# Patient Record
Sex: Male | Born: 1945
Health system: Southern US, Community
[De-identification: ages and names within clinical notes are randomized; demographics above are authoritative.]

## PROBLEM LIST (undated history)

## (undated) DIAGNOSIS — C7951 Secondary malignant neoplasm of bone: Secondary | ICD-10-CM

## (undated) DIAGNOSIS — I10 Essential (primary) hypertension: Secondary | ICD-10-CM

## (undated) DIAGNOSIS — C61 Malignant neoplasm of prostate: Secondary | ICD-10-CM

## (undated) DIAGNOSIS — D62 Acute posthemorrhagic anemia: Secondary | ICD-10-CM

## (undated) HISTORY — PX: HIP SURGERY: SHX245

## (undated) HISTORY — DX: Acute posthemorrhagic anemia: D62

---

## 2006-03-22 DIAGNOSIS — F17200 Nicotine dependence, unspecified, uncomplicated: Secondary | ICD-10-CM | POA: Insufficient documentation

## 2010-08-28 DIAGNOSIS — Z96649 Presence of unspecified artificial hip joint: Secondary | ICD-10-CM | POA: Insufficient documentation

## 2012-11-25 DIAGNOSIS — I10 Essential (primary) hypertension: Secondary | ICD-10-CM | POA: Insufficient documentation

## 2012-11-25 DIAGNOSIS — E7800 Pure hypercholesterolemia, unspecified: Secondary | ICD-10-CM | POA: Insufficient documentation

## 2013-01-13 DIAGNOSIS — H2513 Age-related nuclear cataract, bilateral: Secondary | ICD-10-CM | POA: Insufficient documentation

## 2013-01-13 DIAGNOSIS — H35039 Hypertensive retinopathy, unspecified eye: Secondary | ICD-10-CM | POA: Insufficient documentation

## 2014-12-21 DIAGNOSIS — K219 Gastro-esophageal reflux disease without esophagitis: Secondary | ICD-10-CM | POA: Insufficient documentation

## 2015-11-22 DIAGNOSIS — R195 Other fecal abnormalities: Secondary | ICD-10-CM | POA: Insufficient documentation

## 2016-03-30 DIAGNOSIS — R7303 Prediabetes: Secondary | ICD-10-CM | POA: Insufficient documentation

## 2016-07-05 ENCOUNTER — Encounter: Payer: Self-pay | Admitting: Oncology

## 2016-07-05 ENCOUNTER — Telehealth: Payer: Self-pay | Admitting: Oncology

## 2016-07-05 NOTE — Telephone Encounter (Signed)
Appt w/Shadad on 10/31 at 1pm. Pt agreed. Demographics verified. Letter mailed to the pt.

## 2016-07-09 ENCOUNTER — Encounter: Payer: Self-pay | Admitting: *Deleted

## 2016-07-09 NOTE — Progress Notes (Signed)
This RN called and reminded patient of his appointment with Dr. Alen Blew tomorrow. Patient verbalized understanding.

## 2016-07-10 ENCOUNTER — Ambulatory Visit (HOSPITAL_BASED_OUTPATIENT_CLINIC_OR_DEPARTMENT_OTHER): Payer: Medicare Other

## 2016-07-10 ENCOUNTER — Telehealth: Payer: Self-pay | Admitting: Oncology

## 2016-07-10 ENCOUNTER — Ambulatory Visit (HOSPITAL_BASED_OUTPATIENT_CLINIC_OR_DEPARTMENT_OTHER): Payer: Medicare Other | Admitting: Oncology

## 2016-07-10 VITALS — BP 137/79 | HR 85 | Temp 98.5°F | Resp 20 | Ht 71.0 in | Wt 221.8 lb

## 2016-07-10 DIAGNOSIS — C61 Malignant neoplasm of prostate: Secondary | ICD-10-CM

## 2016-07-10 DIAGNOSIS — E291 Testicular hypofunction: Secondary | ICD-10-CM

## 2016-07-10 DIAGNOSIS — C7951 Secondary malignant neoplasm of bone: Secondary | ICD-10-CM

## 2016-07-10 LAB — CBC WITH DIFFERENTIAL/PLATELET
BASO%: 0.9 % (ref 0.0–2.0)
Basophils Absolute: 0.1 10*3/uL (ref 0.0–0.1)
EOS%: 6.2 % (ref 0.0–7.0)
Eosinophils Absolute: 0.4 10*3/uL (ref 0.0–0.5)
HCT: 36.7 % — ABNORMAL LOW (ref 38.4–49.9)
HGB: 12.2 g/dL — ABNORMAL LOW (ref 13.0–17.1)
LYMPH%: 30.6 % (ref 14.0–49.0)
MCH: 29.4 pg (ref 27.2–33.4)
MCHC: 33.3 g/dL (ref 32.0–36.0)
MCV: 88.4 fL (ref 79.3–98.0)
MONO#: 0.5 10*3/uL (ref 0.1–0.9)
MONO%: 7.8 % (ref 0.0–14.0)
NEUT#: 3.3 10*3/uL (ref 1.5–6.5)
NEUT%: 54.5 % (ref 39.0–75.0)
Platelets: 271 10*3/uL (ref 140–400)
RBC: 4.16 10*6/uL — ABNORMAL LOW (ref 4.20–5.82)
RDW: 14.4 % (ref 11.0–14.6)
WBC: 6.1 10*3/uL (ref 4.0–10.3)
lymph#: 1.9 10*3/uL (ref 0.9–3.3)

## 2016-07-10 LAB — COMPREHENSIVE METABOLIC PANEL
ALT: 21 U/L (ref 0–55)
AST: 19 U/L (ref 5–34)
Albumin: 3.6 g/dL (ref 3.5–5.0)
Alkaline Phosphatase: 150 U/L (ref 40–150)
Anion Gap: 10 mEq/L (ref 3–11)
BUN: 14.1 mg/dL (ref 7.0–26.0)
CO2: 24 mEq/L (ref 22–29)
Calcium: 9.2 mg/dL (ref 8.4–10.4)
Chloride: 107 mEq/L (ref 98–109)
Creatinine: 1.2 mg/dL (ref 0.7–1.3)
EGFR: 61 mL/min/{1.73_m2} — ABNORMAL LOW (ref >90)
Glucose: 111 mg/dl (ref 70–140)
Potassium: 3.5 mEq/L (ref 3.5–5.1)
Sodium: 142 mEq/L (ref 136–145)
Total Bilirubin: 0.28 mg/dL (ref 0.20–1.20)
Total Protein: 7.4 g/dL (ref 6.4–8.3)

## 2016-07-10 NOTE — Telephone Encounter (Signed)
Appointments scheduled per 10/31 LOS. Patient given AVS report and calendar of future scheduled appointments. °

## 2016-07-10 NOTE — Progress Notes (Signed)
Reason for Referral: Prostate cancer.   HPI: 70 year old gentleman native of [place] but lived in [place] for 50 years. He relocated to [facility] in April 2017 to be close to his daughter. He has history of hypertension and hyperlipidemia and was found to have an elevated PSA of 12.8 in 2016. At that time he was evaluated by his primary care physician for frequent urination and nocturia. Because of his PSA elevation, he was referred to urology and underwent a biopsy that showed a Gleason score 4+3 = 7 prostate cancer. Imaging studies showed a large tumor in his prostate with extracapsular extension and enlarged iliac lymph nodes. He was treated with androgen deprivation therapy initially with Lupron on a monthly basis and radiation therapy. Radiation completed in July 2016. Androgen deprivation therapy was switched to every three-month Lupron in April 2016. His PSA initially responded with a PSA nadir of 1.1. was in his PSA went up to 12 in July 2017. Staging workup at that time done at [facility] daily possible lesion in his scapula indicating bone disease. His bone scan showed a possible metastatic area in the medial margin of the right scapula. He did not have a CT scan of the abdomen and pelvis. He was started on Zytiga and prednisone in September 2017. He denied any complications related to this medication although he ran out in the last week or so. Nausea, vomiting or lower extremity edema. He denied any changes in his performance status or activity level. He denied any bone pain, back pain or pathological fractures. Says his relocation to this area, he lives independently and attends activities of daily living.  He does not report any headaches, blurry vision, syncope or seizures. He does not report any fevers, chills or sweats. He does not report any cough, wheezing or hemoptysis. He does not report any chest pain, palpitation, orthopnea or leg edema. He does not report any frequency urgency  or hesitancy. He does not report any musca skeletal complaints. Remaining review of systems unremarkable.   Past medical history including hypertension, hyperlipidemia and erectile dysfunction.    Current Outpatient Prescriptions:  .  abiraterone Acetate (ZYTIGA) 250 MG tablet, Take 1,000 mg by mouth daily. Take on an empty stomach 1 hour before or 2 hours after a meal, Disp: , Rfl:  .  Acetaminophen (TYLENOL PO), Take 500 mg by mouth daily as needed., Disp: , Rfl:  .  amLODipine (NORVASC) 10 MG tablet, Take 10 mg by mouth daily., Disp: , Rfl:  .  aspirin 81 MG tablet, Take 81 mg by mouth daily., Disp: , Rfl:  .  carvedilol (COREG) 12.5 MG tablet, Take 12.5 mg by mouth 2 (two) times daily., Disp: , Rfl: 3 .  ergocalciferol (VITAMIN D2) 50000 units capsule, Take 50,000 Units by mouth once a week., Disp: , Rfl:  .  Multiple Vitamin (MULTIVITAMIN) tablet, Take 1 tablet by mouth daily., Disp: , Rfl:  .  omeprazole (PRILOSEC) 20 MG capsule, Take 20 mg by mouth daily., Disp: , Rfl:  .  oxybutynin (DITROPAN-XL) 10 MG 24 hr tablet, Take 10 mg by mouth at bedtime., Disp: , Rfl:  .  sildenafil (VIAGRA) 100 MG tablet, Take 100 mg by mouth daily as needed for erectile dysfunction., Disp: , Rfl:  .  simvastatin (ZOCOR) 20 MG tablet, Take 20 mg by mouth daily., Disp: , Rfl: :  Allergies  Allergen Reactions  . Lisinopril Swelling    Facial swelling  :  No family history on file.:  Social History   Social History  . Marital status: Divorced    Spouse name: N/A  . Number of children: N/A  . Years of education: N/A   Occupational History  . Not on file.   Social History Main Topics  . Smoking status: Not on file  . Smokeless tobacco: Not on file  . Alcohol use Not on file  . Drug use: Unknown  . Sexual activity: Not on file   Other Topics Concern  . Not on file   Social History Narrative  . No narrative on file  :  Pertinent items are noted in HPI.  Exam: Blood pressure 137/79,  pulse 85, temperature 98.5 F (36.9 C), temperature source Oral, resp. rate 20, height 5\' 11"  (1.803 m), weight 221 lb 12.8 oz (100.6 kg), SpO2 100 %.  ECOG 0 General appearance: alert and cooperative Head: Normocephalic, without obvious abnormality Throat: lips, mucosa, and tongue normal; teeth and gums normal Neck: no adenopathy Back: negative Resp: clear to auscultation bilaterally Chest wall: no tenderness Cardio: regular rate and rhythm, S1, S2 normal, no murmur, click, rub or gallop GI: soft, non-tender; bowel sounds normal; no masses,  no organomegaly Extremities: extremities normal, atraumatic, no cyanosis or edema Pulses: 2+ and symmetric Skin: Skin color, texture, turgor normal. No rashes or lesions Lymph nodes: Cervical, supraclavicular, and axillary nodes normal.    Assessment and Plan:  70 year old gentleman with the following issues:  1. 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 He was initially diagnosed in March 2016 with a Gleason score of 7 and a PSA of 12.8. At that time he had high-risk disease with a large tumor, high volume disease with extracapsular extension. He also had pelvic adenopathy. He completed the radiation therapy and androgen deprivation in 2016.  His PSA in July 2017 was up to 12 after a nadir of 1.1 with a low testosterone indicating castration resistant disease. He was started on Zytiga and prednisone which he took for about a month and ran out from last week or so. He denies any major complications associated with it.  The natural course of this disease was discussed with the patient and his medical records were reviewed from his treating physicians in [place]. He understands that he is an incurable malignancy and any treatments moving forward is palliative. He appears to have very low volume disease at this time although he has high risk cancer. Options of therapy were reviewed which include restarting  Nicki Reaper for systemic chemotherapy.  After discussion today, he wanted in short period of observation until he gets several into this area and will consider restarting Zytiga depending on what his PSA is doing in the next 4-6 weeks. He is not objected to taking Zytiga but prefers not to at this time.  The plan is to obtain his imaging studies from [place] which are not available to me at this time and recheck his PSA. He develops a rapidly rising PSA restarting Zytiga soon a later would be indicated.  2. Androgen depravation: I recommen Lupron and he will be due in January 2018.   3. Bone directed therapy: We have discussed the role of restarting bone directed therapy in the form of Xgeva in the future after obtaining dental clearance.  4. Follow-up: Will be in the next 4-6 weeks to follow his progress closely.

## 2016-07-11 ENCOUNTER — Telehealth: Payer: Self-pay | Admitting: *Deleted

## 2016-07-11 LAB — PSA: Prostate Specific Ag, Serum: 10.1 ng/mL — ABNORMAL HIGH (ref 0.0–4.0)

## 2016-07-11 LAB — TESTOSTERONE: Testosterone, Serum: <3 ng/dL — ABNORMAL LOW (ref 264–916)

## 2016-07-11 NOTE — Telephone Encounter (Signed)
As noted below by Dr. Shadad, I informed patient of his PSA level. Patient verbalized understanding. 

## 2016-07-11 NOTE — Telephone Encounter (Signed)
-----   Message from Wyatt Portela, MD sent at 07/11/2016  8:38 AM EDT ----- Please let him know his PSA is 10. No changes for now.

## 2016-08-22 ENCOUNTER — Ambulatory Visit (HOSPITAL_BASED_OUTPATIENT_CLINIC_OR_DEPARTMENT_OTHER): Payer: Medicare Other | Admitting: Oncology

## 2016-08-22 ENCOUNTER — Other Ambulatory Visit (HOSPITAL_BASED_OUTPATIENT_CLINIC_OR_DEPARTMENT_OTHER): Payer: Medicare Other

## 2016-08-22 ENCOUNTER — Telehealth: Payer: Self-pay | Admitting: Oncology

## 2016-08-22 VITALS — BP 150/89 | HR 70 | Temp 98.2°F | Resp 18 | Ht 71.0 in | Wt 224.3 lb

## 2016-08-22 DIAGNOSIS — C61 Malignant neoplasm of prostate: Secondary | ICD-10-CM

## 2016-08-22 DIAGNOSIS — E291 Testicular hypofunction: Secondary | ICD-10-CM | POA: Diagnosis not present

## 2016-08-22 LAB — COMPREHENSIVE METABOLIC PANEL
ALT: 20 U/L (ref 0–55)
AST: 19 U/L (ref 5–34)
Albumin: 3.7 g/dL (ref 3.5–5.0)
Alkaline Phosphatase: 145 U/L (ref 40–150)
Anion Gap: 9 mEq/L (ref 3–11)
BUN: 10.6 mg/dL (ref 7.0–26.0)
CO2: 24 mEq/L (ref 22–29)
Calcium: 10.1 mg/dL (ref 8.4–10.4)
Chloride: 108 mEq/L (ref 98–109)
Creatinine: 1.2 mg/dL (ref 0.7–1.3)
EGFR: 63 mL/min/{1.73_m2} — ABNORMAL LOW (ref >90)
Glucose: 112 mg/dl (ref 70–140)
Potassium: 4.2 mEq/L (ref 3.5–5.1)
Sodium: 141 mEq/L (ref 136–145)
Total Bilirubin: 0.4 mg/dL (ref 0.20–1.20)
Total Protein: 7.9 g/dL (ref 6.4–8.3)

## 2016-08-22 LAB — CBC WITH DIFFERENTIAL/PLATELET
BASO%: 0.2 % (ref 0.0–2.0)
Basophils Absolute: 0 10*3/uL (ref 0.0–0.1)
EOS%: 4.4 % (ref 0.0–7.0)
Eosinophils Absolute: 0.2 10*3/uL (ref 0.0–0.5)
HCT: 37.8 % — ABNORMAL LOW (ref 38.4–49.9)
HGB: 12.6 g/dL — ABNORMAL LOW (ref 13.0–17.1)
LYMPH%: 33 % (ref 14.0–49.0)
MCH: 29.5 pg (ref 27.2–33.4)
MCHC: 33.3 g/dL (ref 32.0–36.0)
MCV: 88.5 fL (ref 79.3–98.0)
MONO#: 0.5 10*3/uL (ref 0.1–0.9)
MONO%: 8.6 % (ref 0.0–14.0)
NEUT#: 3 10*3/uL (ref 1.5–6.5)
NEUT%: 53.8 % (ref 39.0–75.0)
Platelets: 271 10*3/uL (ref 140–400)
RBC: 4.27 10*6/uL (ref 4.20–5.82)
RDW: 14.2 % (ref 11.0–14.6)
WBC: 5.5 10*3/uL (ref 4.0–10.3)
lymph#: 1.8 10*3/uL (ref 0.9–3.3)

## 2016-08-22 NOTE — Progress Notes (Signed)
Hematology and Oncology Follow Up Visit  Dennis Fischer [id number] 1946-02-03 70 y.o. 08/22/2016 9:16 AM Dennis Fischer, Dennis Newcomer, MD   Principle Diagnosis: 70 year old gentleman with prostate cancer diagnosed in 2016. He had a Gleason score of 7 and a PSA of 12.8. He is currently has castration resistant disease with pelvic adenopathy.   Prior Therapy: He completed the radiation therapy and androgen deprivation in 2016. His PSA in July 2017 was up to 12 after a nadir of 1.1 with a low testosterone indicating castration resistant disease. He was started on Zytiga and prednisone which he took for about a month and subsequently discontinued.  Current therapy: Under evaluation to start salvage therapy.  Interim History:  Dennis Fischer presents today for a follow-up visit. Since the last visit, he reports no major changes in his health. He remains reasonably active and attends to activities of daily living. His appetite is excellent and his performance status is unchanged. He denied any abdominal pain, gross satiety or bone pain.  He does not report any headaches, blurry vision, syncope or seizures. He does not report any fevers, chills or sweats. He does not report any cough, wheezing or hemoptysis. He does not report any chest pain, palpitation, orthopnea or leg edema. He does not report any frequency urgency or hesitancy. He does not report any musca skeletal complaints. Remaining review of systems unremarkable.   Medications: I have reviewed the patient's current medications.  Current Outpatient Prescriptions  Medication Sig Dispense Refill  . Acetaminophen (TYLENOL PO) Take 500 mg by mouth daily as needed.    Marland Kitchen amLODipine (NORVASC) 10 MG tablet Take 10 mg by mouth daily.    Marland Kitchen aspirin 81 MG tablet Take 81 mg by mouth daily.    . carvedilol (COREG) 12.5 MG tablet Take 12.5 mg by mouth 2 (two) times daily.  3  . ergocalciferol (VITAMIN D2) 50000 units capsule Take 50,000 Units by mouth  once a week.    . Multiple Vitamin (MULTIVITAMIN) tablet Take 1 tablet by mouth daily.    Marland Kitchen omeprazole (PRILOSEC) 20 MG capsule Take 20 mg by mouth daily.    Marland Kitchen oxybutynin (DITROPAN-XL) 10 MG 24 hr tablet Take 10 mg by mouth at bedtime.    . sildenafil (VIAGRA) 100 MG tablet Take 100 mg by mouth daily as needed for erectile dysfunction.    . simvastatin (ZOCOR) 20 MG tablet Take 20 mg by mouth daily.    Marland Kitchen abiraterone Acetate (ZYTIGA) 250 MG tablet Take 1,000 mg by mouth daily. Take on an empty stomach 1 hour before or 2 hours after a meal     No current facility-administered medications for this visit.      Allergies:  Allergies  Allergen Reactions  . Lisinopril Swelling    Facial swelling    Past Medical History, Surgical history, Social history, and Family History were reviewed and updated.   Remaining ROS negative. Physical Exam: Blood pressure (!) 150/89, pulse 70, temperature 98.2 F (36.8 C), temperature source Oral, resp. rate 18, height 5\' 11"  (1.803 m), weight 224 lb 4.8 oz (101.7 kg), SpO2 100 %. ECOG: 0 General appearance: alert and cooperative Head: Normocephalic, without obvious abnormality Neck: no adenopathy Lymph nodes: Cervical, supraclavicular, and axillary nodes normal. Heart:regular rate and rhythm, S1, S2 normal, no murmur, click, rub or gallop Lung:chest clear, no wheezing, rales, normal symmetric air entry Abdomin: soft, non-tender, without masses or organomegaly EXT:no erythema, induration, or nodules   Lab Results: Lab Results  Component Value Date  WBC 5.5 08/22/2016   HGB 12.6 (L) 08/22/2016   HCT 37.8 (L) 08/22/2016   MCV 88.5 08/22/2016   PLT 271 08/22/2016     Chemistry      Component Value Date/Time   NA 142 07/10/2016 1430   K 3.5 07/10/2016 1430   CO2 24 07/10/2016 1430   BUN 14.1 07/10/2016 1430   CREATININE 1.2 07/10/2016 1430      Component Value Date/Time   CALCIUM 9.2 07/10/2016 1430   ALKPHOS 150 07/10/2016 1430   AST 19  07/10/2016 1430   ALT 21 07/10/2016 1430   BILITOT 0.28 07/10/2016 1430      Impression and Plan:  1. 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 He was initially diagnosed in March 2016 with a Gleason score of 7 and a PSA of 12.8. At that time he had high-risk disease with a large tumor, high volume disease with extracapsular extension. He also had pelvic adenopathy. He completed the radiation therapy and androgen deprivation in 2016.  His PSA in July 2017 was up to 12 after a nadir of 1.1 with a low testosterone indicating castration resistant disease. He was started on Zytiga and prednisone which he took for about a month. He is currently not receiving any treatment. His last PSA was 10 and 07/10/2016.  Risks and benefits of restarting Zytiga were reviewed today and he elected to defer treatment for the time being. The plan is to continue with observation and surveillance and we will resume Zytiga's PSA starts to rise rapidly.  2. Androgen depravation: His testosterone is castrated at this time and will restart Lupron if his testosterone starts to rise.  3. Follow-up: I'll be in 2 months to check on his clinical status.    [facility], MD 12/13/20179:16 AM

## 2016-08-22 NOTE — Telephone Encounter (Signed)
Gave patient avs report and appointments for February.  °

## 2016-08-23 LAB — PSA: Prostate Specific Ag, Serum: 12.9 ng/mL — ABNORMAL HIGH (ref 0.0–4.0)

## 2016-09-20 DIAGNOSIS — E119 Type 2 diabetes mellitus without complications: Secondary | ICD-10-CM | POA: Diagnosis not present

## 2016-09-20 DIAGNOSIS — E669 Obesity, unspecified: Secondary | ICD-10-CM | POA: Diagnosis not present

## 2016-09-20 DIAGNOSIS — I10 Essential (primary) hypertension: Secondary | ICD-10-CM | POA: Diagnosis not present

## 2016-10-15 ENCOUNTER — Telehealth: Payer: Self-pay | Admitting: Oncology

## 2016-10-15 NOTE — Telephone Encounter (Signed)
Confirmed change in appointments

## 2016-10-19 ENCOUNTER — Ambulatory Visit: Payer: Medicare Other | Admitting: Oncology

## 2016-10-19 ENCOUNTER — Other Ambulatory Visit: Payer: Medicare Other

## 2016-11-01 ENCOUNTER — Ambulatory Visit (HOSPITAL_BASED_OUTPATIENT_CLINIC_OR_DEPARTMENT_OTHER): Payer: Medicare Other | Admitting: Oncology

## 2016-11-01 ENCOUNTER — Telehealth: Payer: Self-pay | Admitting: Oncology

## 2016-11-01 ENCOUNTER — Other Ambulatory Visit (HOSPITAL_BASED_OUTPATIENT_CLINIC_OR_DEPARTMENT_OTHER): Payer: Medicare Other

## 2016-11-01 VITALS — BP 130/87 | HR 79 | Temp 97.9°F | Resp 18 | Ht 71.0 in | Wt 229.4 lb

## 2016-11-01 DIAGNOSIS — C61 Malignant neoplasm of prostate: Secondary | ICD-10-CM

## 2016-11-01 DIAGNOSIS — E291 Testicular hypofunction: Secondary | ICD-10-CM | POA: Diagnosis not present

## 2016-11-01 LAB — COMPREHENSIVE METABOLIC PANEL
ALT: 20 U/L (ref 0–55)
AST: 17 U/L (ref 5–34)
Albumin: 3.8 g/dL (ref 3.5–5.0)
Alkaline Phosphatase: 158 U/L — ABNORMAL HIGH (ref 40–150)
Anion Gap: 11 mEq/L (ref 3–11)
BUN: 14.6 mg/dL (ref 7.0–26.0)
CO2: 20 mEq/L — ABNORMAL LOW (ref 22–29)
Calcium: 9.3 mg/dL (ref 8.4–10.4)
Chloride: 109 mEq/L (ref 98–109)
Creatinine: 1.1 mg/dL (ref 0.7–1.3)
EGFR: 68 mL/min/{1.73_m2} — ABNORMAL LOW (ref >90)
Glucose: 110 mg/dl (ref 70–140)
Potassium: 3.6 mEq/L (ref 3.5–5.1)
Sodium: 140 mEq/L (ref 136–145)
Total Bilirubin: 0.28 mg/dL (ref 0.20–1.20)
Total Protein: 7.3 g/dL (ref 6.4–8.3)

## 2016-11-01 LAB — CBC WITH DIFFERENTIAL/PLATELET
BASO%: 0.2 % (ref 0.0–2.0)
Basophils Absolute: 0 10*3/uL (ref 0.0–0.1)
EOS%: 5.3 % (ref 0.0–7.0)
Eosinophils Absolute: 0.3 10*3/uL (ref 0.0–0.5)
HCT: 34.2 % — ABNORMAL LOW (ref 38.4–49.9)
HGB: 11.5 g/dL — ABNORMAL LOW (ref 13.0–17.1)
LYMPH%: 36.2 % (ref 14.0–49.0)
MCH: 29 pg (ref 27.2–33.4)
MCHC: 33.6 g/dL (ref 32.0–36.0)
MCV: 86.4 fL (ref 79.3–98.0)
MONO#: 0.5 10*3/uL (ref 0.1–0.9)
MONO%: 8.5 % (ref 0.0–14.0)
NEUT#: 2.6 10*3/uL (ref 1.5–6.5)
NEUT%: 49.8 % (ref 39.0–75.0)
Platelets: 229 10*3/uL (ref 140–400)
RBC: 3.96 10*6/uL — ABNORMAL LOW (ref 4.20–5.82)
RDW: 14.6 % (ref 11.0–14.6)
WBC: 5.3 10*3/uL (ref 4.0–10.3)
lymph#: 1.9 10*3/uL (ref 0.9–3.3)

## 2016-11-01 NOTE — Telephone Encounter (Signed)
Appointments scheduled per 2/22 LOS. Patient given AVS report and calendars with future scheduled appointments. °

## 2016-11-01 NOTE — Progress Notes (Signed)
Hematology and Oncology Follow Up Visit  Dennis Fischer [id number] 1946/08/03 71 y.o. 11/01/2016 4:14 PM Dennis Fischer, Dennis Newcomer, MD   Principle Diagnosis: 71 year old gentleman with prostate cancer diagnosed in 2016. He had a Gleason score of 7 and a PSA of 12.8. He is currently has castration resistant disease with pelvic adenopathy.   Prior Therapy: He completed the radiation therapy and androgen deprivation in 2016. His PSA in July 2017 was up to 12 after a nadir of 1.1 with a low testosterone indicating castration resistant disease. He was started on Zytiga and prednisone which he took for about a month and subsequently discontinued.  Current therapy: Under evaluation to start salvage therapy.  Interim History:  Dennis Fischer presents today for a follow-up visit. Since the last visit, he continues to be asymptomatic from his cancer. He denied any back pain, shoulder pain or pathological fractures. He remains reasonably active and attends to activities of daily living. His appetite is excellent and his performance status is unchanged. He denied any abdominal pain or change in his bowel habits..  He does not report any headaches, blurry vision, syncope or seizures. He does not report any fevers, chills or sweats. He does not report any cough, wheezing or hemoptysis. He does not report any chest pain, palpitation, orthopnea or leg edema. He does not report any frequency urgency or hesitancy. He does not report any musca skeletal complaints. Remaining review of systems unremarkable.   Medications: I have reviewed the patient's current medications.  Current Outpatient Prescriptions  Medication Sig Dispense Refill  . abiraterone Acetate (ZYTIGA) 250 MG tablet Take 1,000 mg by mouth daily. Take on an empty stomach 1 hour before or 2 hours after a meal    . Acetaminophen (TYLENOL PO) Take 500 mg by mouth daily as needed.    Marland Kitchen amLODipine (NORVASC) 10 MG tablet Take 10 mg by mouth daily.     Marland Kitchen aspirin 81 MG tablet Take 81 mg by mouth daily.    . carvedilol (COREG) 12.5 MG tablet Take 12.5 mg by mouth 2 (two) times daily.  3  . ergocalciferol (VITAMIN D2) 50000 units capsule Take 50,000 Units by mouth once a week.    . Multiple Vitamin (MULTIVITAMIN) tablet Take 1 tablet by mouth daily.    Marland Kitchen omeprazole (PRILOSEC) 20 MG capsule Take 20 mg by mouth daily.    Marland Kitchen oxybutynin (DITROPAN-XL) 10 MG 24 hr tablet Take 10 mg by mouth at bedtime.    . sildenafil (VIAGRA) 100 MG tablet Take 100 mg by mouth daily as needed for erectile dysfunction.    . simvastatin (ZOCOR) 20 MG tablet Take 20 mg by mouth daily.     No current facility-administered medications for this visit.      Allergies:  Allergies  Allergen Reactions  . Lisinopril Swelling    Facial swelling    Past Medical History, Surgical history, Social history, and Family History were reviewed and updated.   Remaining ROS negative. Physical Exam: Blood pressure 130/87, pulse 79, temperature 97.9 F (36.6 C), temperature source Oral, resp. rate 18, height 5\' 11"  (1.803 m), weight 229 lb 6.4 oz (104.1 kg), SpO2 99 %. ECOG: 0 General appearance: alert and cooperative appeared without distress. Head: Normocephalic, without obvious abnormality no oral ulcers or lesions. Neck: no adenopathy Lymph nodes: Cervical, supraclavicular, and axillary nodes normal. Heart:regular rate and rhythm, S1, S2 normal, no murmur, click, rub or gallop Lung:chest clear, no wheezing, rales, normal symmetric air entry Abdomin: soft, non-tender, without  masses or organomegaly no shifting dullness or ascites. EXT:no erythema, induration, or nodules   Lab Results: Lab Results  Component Value Date   WBC 5.3 11/01/2016   HGB 11.5 (L) 11/01/2016   HCT 34.2 (L) 11/01/2016   MCV 86.4 11/01/2016   PLT 229 11/01/2016     Chemistry      Component Value Date/Time   NA 140 11/01/2016 1533   K 3.6 11/01/2016 1533   CO2 20 (L) 11/01/2016 1533    BUN 14.6 11/01/2016 1533   CREATININE 1.1 11/01/2016 1533      Component Value Date/Time   CALCIUM 9.3 11/01/2016 1533   ALKPHOS 158 (H) 11/01/2016 1533   AST 17 11/01/2016 1533   ALT 20 11/01/2016 1533   BILITOT 0.28 11/01/2016 1533      Impression and Plan:  1. 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 He was initially diagnosed in March 2016 with a Gleason score of 7 and a PSA of 12.8. At that time he had high-risk disease with a large tumor, high volume disease with extracapsular extension. He also had pelvic adenopathy. He completed the radiation therapy and androgen deprivation in 2016.  His PSA in July 2017 was up to 12 after a nadir of 1.1 with a low testosterone indicating castration resistant disease. He was started on Zytiga and prednisone which he took for about a month.  He has been off treatment since relocating from [place] to this area based on his wishes. His PSA remained relatively stable close to 12 of treatment.  Risks and benefits of restarting Zytiga were reviewed today and he elected to defer treatment for the time being. The plan is to restage him before the next visit including a CT scan and a bone scan and a repeat PSA. Different salvage therapy will be used in his PSA starts to rise rapidly or he becomes symptomatic from his disease. He understands he will likely require either restart of Zytiga or different agent.  2. Androgen depravation: His testosterone is castrated at this time and will restart Lupron if his testosterone starts to rise.  3. Follow-up: I'll be in May 2018 after repeat staging workup.    [facility], MD 2/22/20184:14 PM

## 2016-11-02 LAB — PSA: Prostate Specific Ag, Serum: 22.5 ng/mL — ABNORMAL HIGH (ref 0.0–4.0)

## 2016-11-02 LAB — TESTOSTERONE: Testosterone, Serum: 5 ng/dL — ABNORMAL LOW (ref 264–916)

## 2017-01-11 ENCOUNTER — Other Ambulatory Visit (HOSPITAL_BASED_OUTPATIENT_CLINIC_OR_DEPARTMENT_OTHER): Payer: Medicare Other

## 2017-01-11 DIAGNOSIS — C61 Malignant neoplasm of prostate: Secondary | ICD-10-CM

## 2017-01-11 LAB — COMPREHENSIVE METABOLIC PANEL
ALT: 17 U/L (ref 0–55)
AST: 15 U/L (ref 5–34)
Albumin: 4.1 g/dL (ref 3.5–5.0)
Alkaline Phosphatase: 170 U/L — ABNORMAL HIGH (ref 40–150)
Anion Gap: 12 mEq/L — ABNORMAL HIGH (ref 3–11)
BUN: 13.6 mg/dL (ref 7.0–26.0)
CO2: 23 mEq/L (ref 22–29)
Calcium: 9.5 mg/dL (ref 8.4–10.4)
Chloride: 107 mEq/L (ref 98–109)
Creatinine: 1.2 mg/dL (ref 0.7–1.3)
EGFR: 60 mL/min/{1.73_m2} — ABNORMAL LOW (ref >90)
Glucose: 104 mg/dl (ref 70–140)
Potassium: 4.1 mEq/L (ref 3.5–5.1)
Sodium: 142 mEq/L (ref 136–145)
Total Bilirubin: 0.46 mg/dL (ref 0.20–1.20)
Total Protein: 7.4 g/dL (ref 6.4–8.3)

## 2017-01-11 LAB — CBC WITH DIFFERENTIAL/PLATELET
BASO%: 1 % (ref 0.0–2.0)
Basophils Absolute: 0 10*3/uL (ref 0.0–0.1)
EOS%: 5 % (ref 0.0–7.0)
Eosinophils Absolute: 0.2 10*3/uL (ref 0.0–0.5)
HCT: 37.8 % — ABNORMAL LOW (ref 38.4–49.9)
HGB: 12.6 g/dL — ABNORMAL LOW (ref 13.0–17.1)
LYMPH%: 34.7 % (ref 14.0–49.0)
MCH: 29.3 pg (ref 27.2–33.4)
MCHC: 33.4 g/dL (ref 32.0–36.0)
MCV: 87.8 fL (ref 79.3–98.0)
MONO#: 0.4 10*3/uL (ref 0.1–0.9)
MONO%: 9.4 % (ref 0.0–14.0)
NEUT#: 2.4 10*3/uL (ref 1.5–6.5)
NEUT%: 49.9 % (ref 39.0–75.0)
Platelets: 255 10*3/uL (ref 140–400)
RBC: 4.31 10*6/uL (ref 4.20–5.82)
RDW: 15.3 % — ABNORMAL HIGH (ref 11.0–14.6)
WBC: 4.7 10*3/uL (ref 4.0–10.3)
lymph#: 1.6 10*3/uL (ref 0.9–3.3)

## 2017-01-14 LAB — PSA: Prostate Specific Ag, Serum: 61.7 ng/mL — ABNORMAL HIGH (ref 0.0–4.0)

## 2017-01-14 LAB — TESTOSTERONE: Testosterone, Serum: 294 ng/dL (ref 264–916)

## 2017-01-18 ENCOUNTER — Ambulatory Visit (HOSPITAL_COMMUNITY)
Admission: RE | Admit: 2017-01-18 | Discharge: 2017-01-18 | Disposition: A | Discharge status: Home / Self Care | Payer: Medicare Other | Source: Ambulatory Visit | Attending: Oncology | Admitting: Oncology

## 2017-01-18 ENCOUNTER — Encounter (HOSPITAL_COMMUNITY)
Admission: RE | Admit: 2017-01-18 | Discharge: 2017-01-18 | Disposition: A | Discharge status: Home / Self Care | Payer: Medicare Other | Source: Ambulatory Visit | Attending: Oncology | Admitting: Oncology

## 2017-01-18 ENCOUNTER — Other Ambulatory Visit: Payer: Medicare Other

## 2017-01-18 DIAGNOSIS — K449 Diaphragmatic hernia without obstruction or gangrene: Secondary | ICD-10-CM | POA: Insufficient documentation

## 2017-01-18 DIAGNOSIS — C61 Malignant neoplasm of prostate: Secondary | ICD-10-CM

## 2017-01-18 DIAGNOSIS — R937 Abnormal findings on diagnostic imaging of other parts of musculoskeletal system: Secondary | ICD-10-CM | POA: Insufficient documentation

## 2017-01-18 DIAGNOSIS — K4090 Unilateral inguinal hernia, without obstruction or gangrene, not specified as recurrent: Secondary | ICD-10-CM | POA: Insufficient documentation

## 2017-01-18 IMAGING — NM NM BONE WHOLE BODY
2 series · 2 of 2 positions shown · non-contrast
Comparison: Abdomen and pelvis CT obtained today.

CLINICAL DATA: Followup prostate cancer with bone metastases. No
pain. Elevated PSA of 61.7.

EXAM:
NUCLEAR MEDICINE WHOLE BODY BONE SCAN
TECHNIQUE: Whole body anterior and posterior images were obtained approximately
3 hours after intravenous injection of radiopharmaceutical.
RADIOPHARMACEUTICALS:  19.1 mCi [9C] MDP IV

[Series 1: wbr_bone_40 whole body · 2.66mm/px · 1 of 1 slices shown (1 of 2)]
[im 1/1]
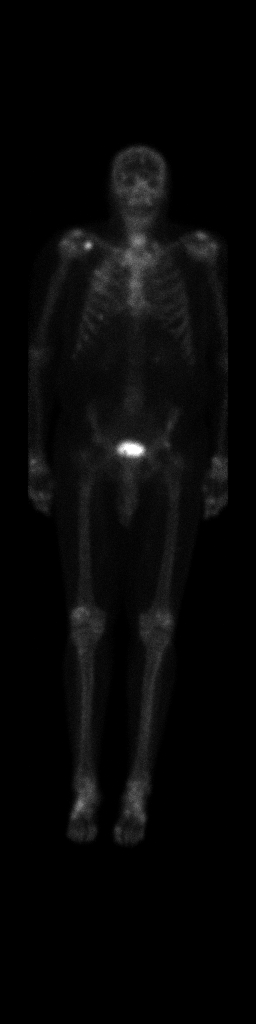

[Series 1: wbr_bone_40 whole body · 2.66mm/px · 1 of 1 slices shown (2 of 2)]
[im 1/1]
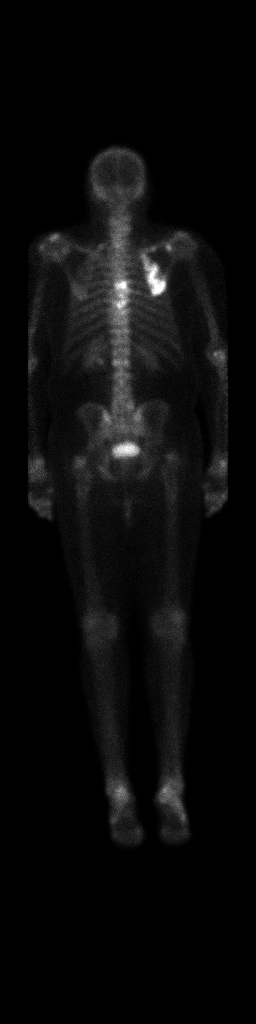

[2 of 2 positions shown; findings below may reference images not displayed]

FINDINGS: Extensive increased tracer uptake in the medial aspect of the right
scapula. Multiple foci of increased tracer uptake in the mid
thoracic spine. Focally increased tracer uptake in the right
shoulder, most likely in the lateral aspect of the scapula.
Additional increased tracer uptake in both shoulders, both wrists,
both knees, both ankles and both feet, compatible with degenerative
changes. Multiple foci of increased tracer uptake throughout the
sternum and manubrium. Normal renal and bladder activity.
IMPRESSION: 1. Findings compatible with metastatic disease in the right scapula,
T6 through T8 levels of the thoracic spine and in the sternum and
manubrium.
2. Multi joint degenerative changes.

## 2017-01-18 IMAGING — CT CT ABD-PELV W/ CM
2 of 5 series · 15 of 46 positions shown, 17 images · IV contrast (APPLIED)
Comparison: None.

CLINICAL DATA: Prostate carcinoma. Previous radiation therapy and
androgen deprivation.

EXAM:
CT ABDOMEN AND PELVIS WITH CONTRAST
TECHNIQUE: Multidetector CT imaging of the abdomen and pelvis was performed
using the standard protocol following bolus administration of
intravenous contrast.
CONTRAST:  100mL [B9] IOPAMIDOL ([B9]) INJECTION 61%

[Series 2: axial st · axial · 0.86mm/px · z∈[-461,-81]mm · 12 of 90 slices shown, 14 images]
[im 7/90  soft-tissue]
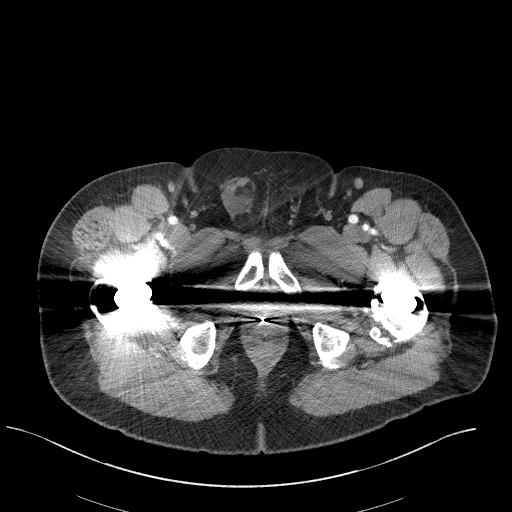
[im 7/90  bone]
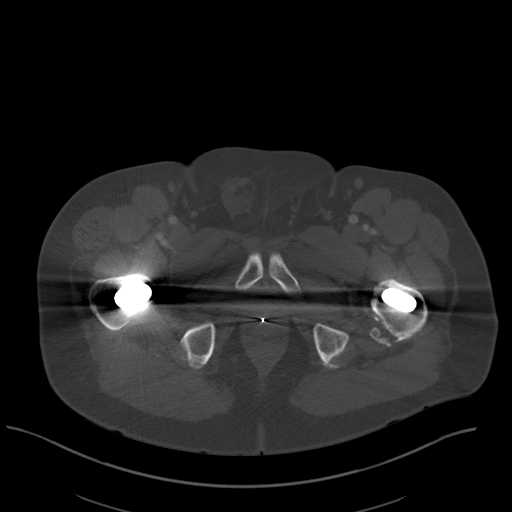
[im 14/90  soft-tissue]
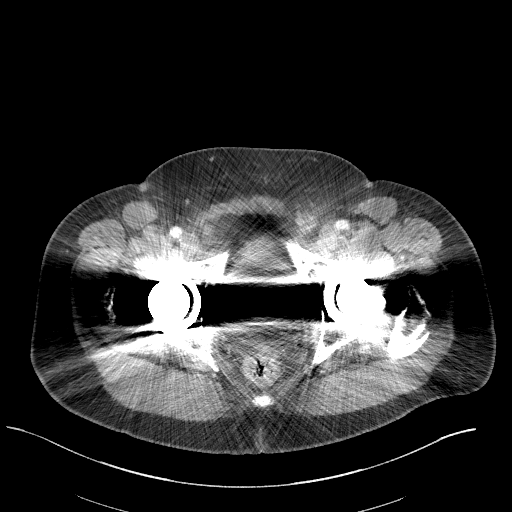
[im 21/90  soft-tissue]
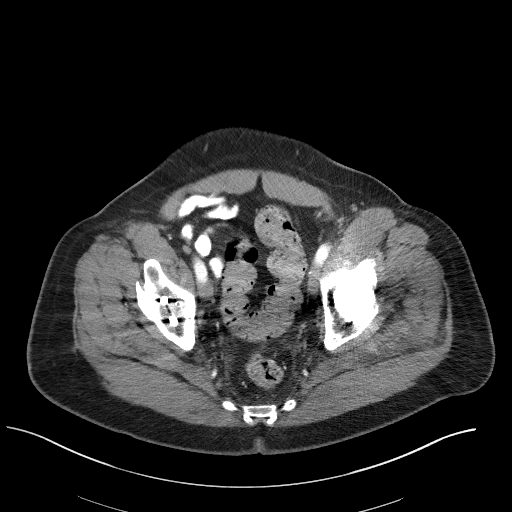
[im 28/90  soft-tissue]
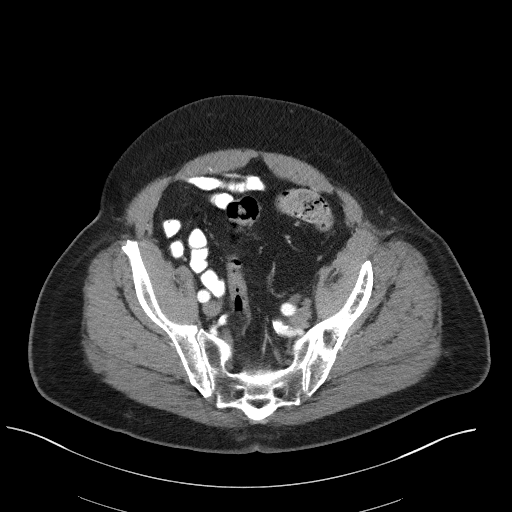
[im 35/90  soft-tissue]
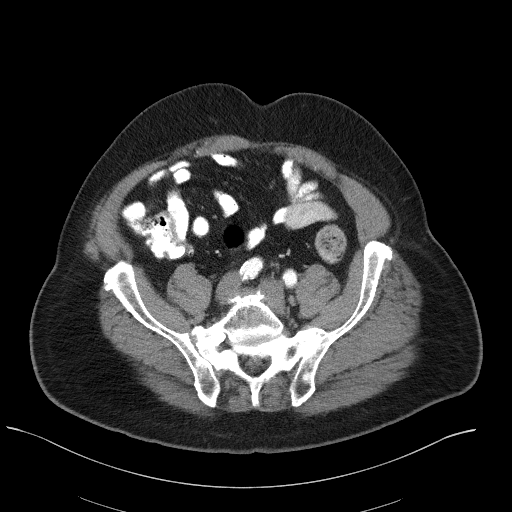
[im 42/90  soft-tissue]
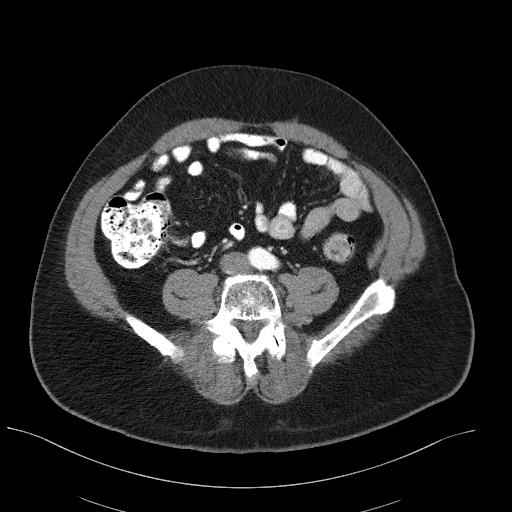
[im 48/90  soft-tissue]
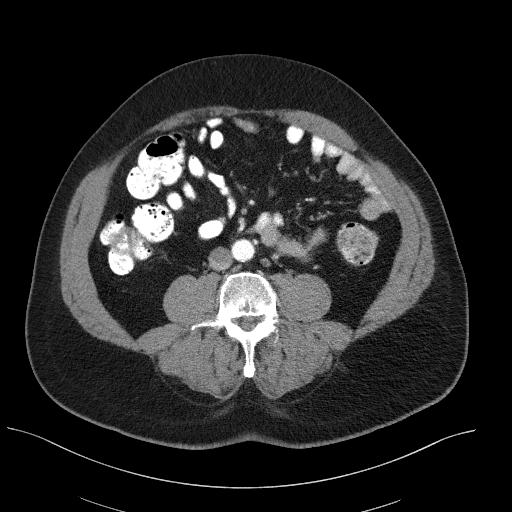
[im 55/90  soft-tissue]
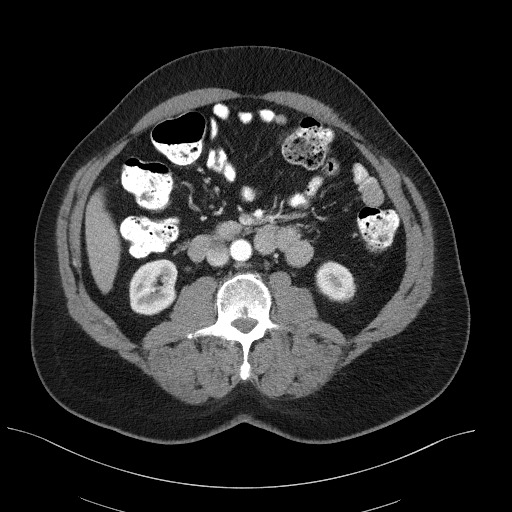
[im 62/90  soft-tissue]
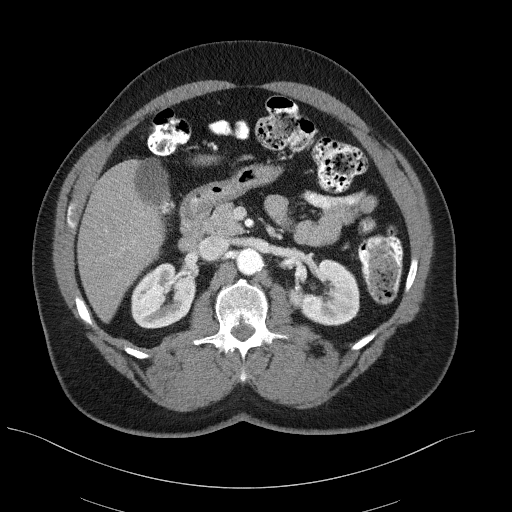
[im 62/90  bone]
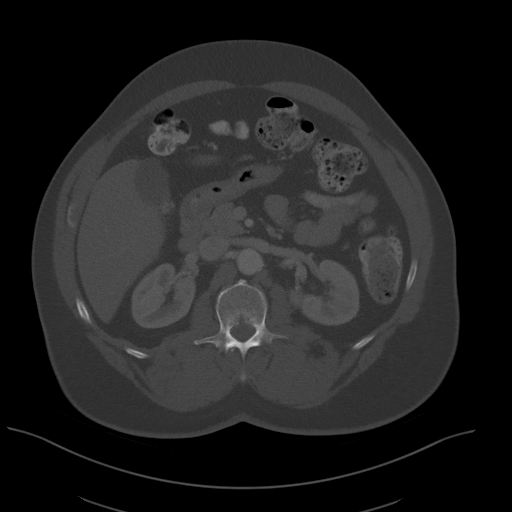
[im 69/90  soft-tissue]
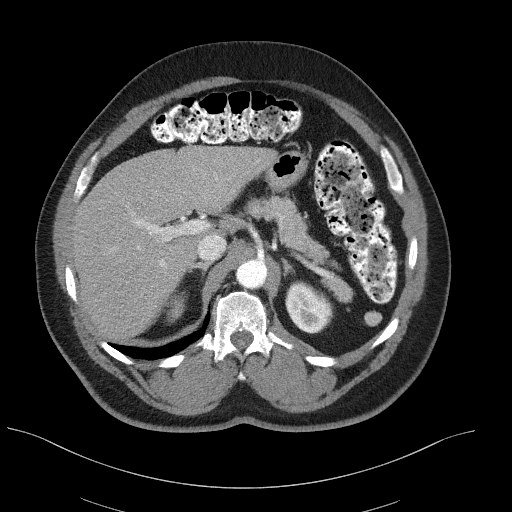
[im 76/90  soft-tissue]
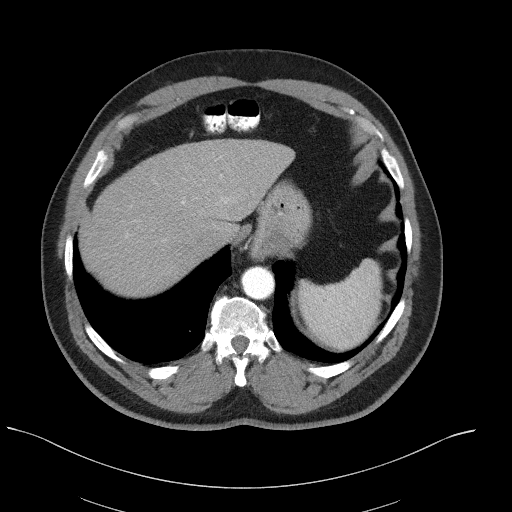
[im 83/90  soft-tissue]
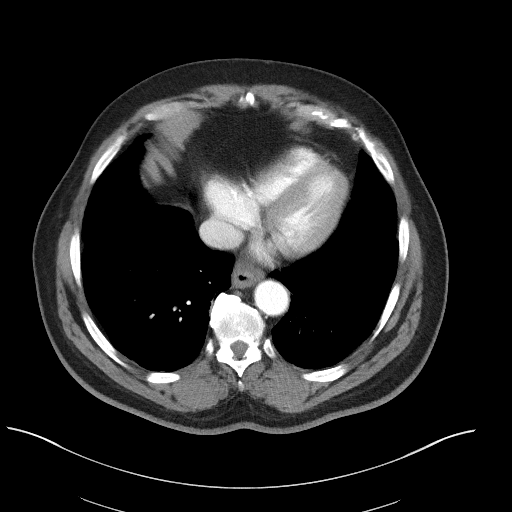

[Series 5: coronal st · coronal · 0.68mm/px · 3 of 108 slices shown]
[im 36/108  soft-tissue]
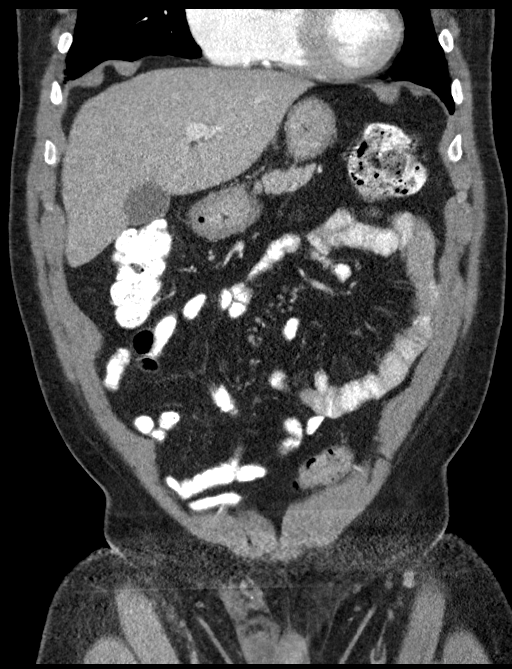
[im 48/108  soft-tissue]
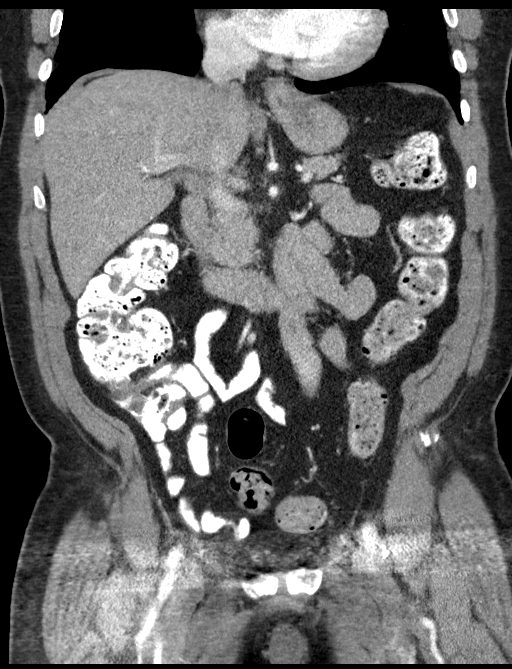
[im 60/108  soft-tissue]
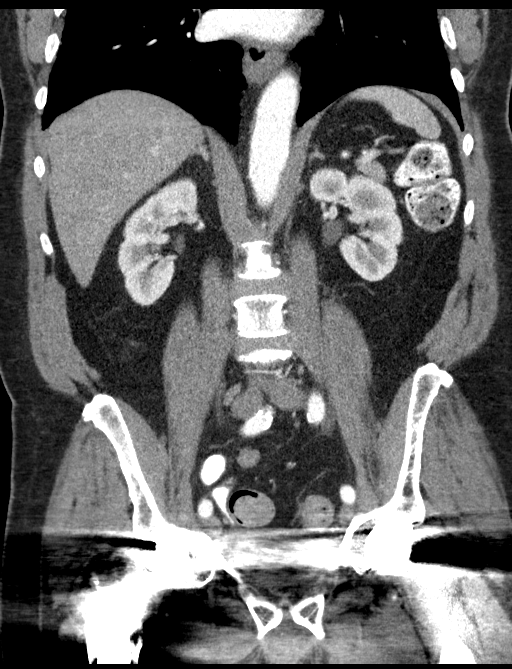

[15 of 46 positions shown; findings below may reference images not displayed]

FINDINGS: Lower Chest: No acute findings.

Hepatobiliary:  No masses identified. Gallbladder is unremarkable.

Pancreas:  No mass or inflammatory changes.

Spleen: Within normal limits in size and appearance.

Adrenals/Urinary Tract: No masses identified. No evidence of
hydronephrosis. Bladder obscured by beam hardening artifact from
bilateral hip prostheses .

Stomach/Bowel: Small hiatal hernia. No evidence of obstruction,
inflammatory process or abnormal fluid collections. Normal appendix
visualized.

Vascular/Lymphatic: No pathologically enlarged lymph nodes. No
abdominal aortic aneurysm.

Reproductive: Lower pelvis and region of prostate obscured by marked
beam hardening artifact from bilateral hip prostheses.

Other:  Small right inguinal hernia containing only fat.

Musculoskeletal: No suspicious bone lesions identified. Lower lumbar
spondylosis, with severe degenerative disc disease and grade 1
anterolisthesis at L4-5.
IMPRESSION: Lower pelvis and region of prostate gland obscured by artifact from
bilateral hip prostheses. No evidence of metastatic disease
elsewhere within the pelvis or abdomen.

Small hiatal hernia and fat-containing right inguinal hernia.

## 2017-01-18 MED ORDER — IOPAMIDOL (ISOVUE-300) INJECTION 61%
100.0000 mL | Freq: Once | INTRAVENOUS | Status: AC | PRN
  Administered 2017-01-18: 100 mL via INTRAVENOUS

## 2017-01-18 MED ORDER — TECHNETIUM TC 99M MEDRONATE IV KIT
25.0000 | PACK | Freq: Once | INTRAVENOUS | Status: DC | PRN
Start: 2017-01-18 — End: 2017-01-24

## 2017-01-18 MED ORDER — IOPAMIDOL (ISOVUE-300) INJECTION 61%
INTRAVENOUS | Status: AC
  Filled 2017-01-18: qty 100

## 2017-01-22 ENCOUNTER — Ambulatory Visit (HOSPITAL_BASED_OUTPATIENT_CLINIC_OR_DEPARTMENT_OTHER): Payer: Medicare Other | Admitting: Oncology

## 2017-01-22 ENCOUNTER — Telehealth: Payer: Self-pay | Admitting: Oncology

## 2017-01-22 VITALS — BP 142/88 | HR 59 | Temp 98.4°F | Resp 18 | Ht 71.0 in | Wt 224.1 lb

## 2017-01-22 DIAGNOSIS — C7951 Secondary malignant neoplasm of bone: Secondary | ICD-10-CM | POA: Diagnosis not present

## 2017-01-22 DIAGNOSIS — E291 Testicular hypofunction: Secondary | ICD-10-CM

## 2017-01-22 DIAGNOSIS — C61 Malignant neoplasm of prostate: Secondary | ICD-10-CM

## 2017-01-22 DIAGNOSIS — I10 Essential (primary) hypertension: Secondary | ICD-10-CM

## 2017-01-22 MED ORDER — ABIRATERONE ACETATE 250 MG PO TABS: 1000.0000 mg | ORAL_TABLET | Freq: Every day | ORAL | 0 refills | Status: DC

## 2017-01-22 NOTE — Telephone Encounter (Signed)
Gave patient avs report and appointments for May and June.  °

## 2017-01-22 NOTE — Progress Notes (Signed)
Hematology and Oncology Follow Up Visit  Dennis Fischer [id number] [birth year] 71 y.o. 01/22/2017 9:10 AM Elwyn Reach, MDGarba, Henderson Newcomer, MD   Principle Diagnosis: 10 year old gentleman with prostate cancer diagnosed in 2016. He had a Gleason score of 7 and a PSA of 12.8. He is currently has castration resistant disease with pelvic adenopathy.   Prior Therapy: He completed the radiation therapy and androgen deprivation in 2016. His PSA in July 2017 was up to 12 after a nadir of 1.1 with a low testosterone indicating castration resistant disease. He was started on Zytiga and prednisone which he took for about a month and subsequently discontinued.  Current therapy: Under evaluation to start therapy.  Interim History:  Mr. Garrido presents today for a follow-up visit. Since the last visit, he reports no major changes in his health. He reports no back pain, shoulder pain or pathological fractures. He remains reasonably active and attends to activities of daily living. His appetite is excellent and his performance status is unchanged. He does not report any genitourinary complaints without any hematuria or dysuria. His quality of life is not dramatically changed. He is having more difficulties with ambulating long distances as a lack of stamina.  He does not report any headaches, blurry vision, syncope or seizures. He does not report any fevers, chills or sweats. He does not report any cough, wheezing or hemoptysis. He does not report any chest pain, palpitation, orthopnea or leg edema. He does not report any frequency urgency or hesitancy. He does not report any musca skeletal complaints. Remaining review of systems unremarkable.   Medications: I have reviewed the patient's current medications.  Current Outpatient Prescriptions  Medication Sig Dispense Refill  . abiraterone Acetate (ZYTIGA) 250 MG tablet Take 1,000 mg by mouth daily. Take on an empty stomach 1 hour before or 2 hours after a  meal    . abiraterone Acetate (ZYTIGA) 250 MG tablet Take 4 tablets (1,000 mg total) by mouth daily. Take on an empty stomach 1 hour before or 2 hours after a meal 120 tablet 0  . Acetaminophen (TYLENOL PO) Take 500 mg by mouth daily as needed.    Marland Kitchen amLODipine (NORVASC) 10 MG tablet Take 10 mg by mouth daily.    Marland Kitchen aspirin 81 MG tablet Take 81 mg by mouth daily.    . carvedilol (COREG) 12.5 MG tablet Take 12.5 mg by mouth 2 (two) times daily.  3  . ergocalciferol (VITAMIN D2) 50000 units capsule Take 50,000 Units by mouth once a week.    . Multiple Vitamin (MULTIVITAMIN) tablet Take 1 tablet by mouth daily.    Marland Kitchen omeprazole (PRILOSEC) 20 MG capsule Take 20 mg by mouth daily.    Marland Kitchen oxybutynin (DITROPAN-XL) 10 MG 24 hr tablet Take 10 mg by mouth at bedtime.    . sildenafil (VIAGRA) 100 MG tablet Take 100 mg by mouth daily as needed for erectile dysfunction.    . simvastatin (ZOCOR) 20 MG tablet Take 20 mg by mouth daily.     No current facility-administered medications for this visit.    Facility-Administered Medications Ordered in Other Visits  Medication Dose Route Frequency Provider Last Rate Last Dose  . technetium medronate (TC-MDP) injection 25 millicurie  25 millicurie Intravenous Once PRN Rolm Baptise, MD         Allergies:  Allergies  Allergen Reactions  . Lisinopril Swelling    Facial swelling    Past Medical History, Surgical history, Social history, and Family History were reviewed  and updated.    Physical Exam: Blood pressure (!) 142/88, pulse (!) 59, temperature 98.4 F (36.9 C), temperature source Oral, resp. rate 18, height 5\' 11"  (1.803 m), weight 224 lb 1.6 oz (101.7 kg), SpO2 100 %. ECOG: 0 General appearance: Alert, awake gentleman appeared comfortable without distress. Head: Normocephalic, without obvious abnormality no oral thrush or ulcers. Neck: no adenopathy Lymph nodes: Cervical, supraclavicular, and axillary nodes normal. Heart:regular rate and rhythm,  S1, S2 normal, no murmur, click, rub or gallop Lung:chest clear, no wheezing, rales, normal symmetric air entry Abdomin: soft, non-tender, without masses or organomegaly no rebound or guarding. EXT:no erythema, induration, or nodules   Lab Results: Lab Results  Component Value Date   WBC 4.7 01/11/2017   HGB 12.6 (L) 01/11/2017   HCT 37.8 (L) 01/11/2017   MCV 87.8 01/11/2017   PLT 255 01/11/2017     Chemistry      Component Value Date/Time   NA 142 01/11/2017 0801   K 4.1 01/11/2017 0801   CO2 23 01/11/2017 0801   BUN 13.6 01/11/2017 0801   CREATININE 1.2 01/11/2017 0801      Component Value Date/Time   CALCIUM 9.5 01/11/2017 0801   ALKPHOS 170 (H) 01/11/2017 0801   AST 15 01/11/2017 0801   ALT 17 01/11/2017 0801   BILITOT 0.46 01/11/2017 0801      Results for KOLESON, REIFSTECK (MRN [id number]) as of 01/22/2017 08:43  Ref. Range 01/11/2017 08:01  Testosterone Latest Ref Range: 264 - 916 ng/dL 294  PSA Latest Ref Range: 0.0 - 4.0 ng/mL 61.7 (H)   EXAM: CT ABDOMEN AND PELVIS WITH CONTRAST  TECHNIQUE: Multidetector CT imaging of the abdomen and pelvis was performed using the standard protocol following bolus administration of intravenous contrast.  CONTRAST:  149mL ISOVUE-300 IOPAMIDOL (ISOVUE-300) INJECTION 61%  COMPARISON:  None.  FINDINGS: Lower Chest: No acute findings.  Hepatobiliary:  No masses identified. Gallbladder is unremarkable.  Pancreas:  No mass or inflammatory changes.  Spleen: Within normal limits in size and appearance.  Adrenals/Urinary Tract: No masses identified. No evidence of hydronephrosis. Bladder obscured by beam hardening artifact from bilateral hip prostheses .  Stomach/Bowel: Small hiatal hernia. No evidence of obstruction, inflammatory process or abnormal fluid collections. Normal appendix visualized.  Vascular/Lymphatic: No pathologically enlarged lymph nodes. No abdominal aortic aneurysm.  Reproductive: Lower  pelvis and region of prostate obscured by marked beam hardening artifact from bilateral hip prostheses.  Other:  Small right inguinal hernia containing only fat.  Musculoskeletal: No suspicious bone lesions identified. Lower lumbar spondylosis, with severe degenerative disc disease and grade 1 anterolisthesis at L4-5.  IMPRESSION: Lower pelvis and region of prostate gland obscured by artifact from bilateral hip prostheses. No evidence of metastatic disease elsewhere within the pelvis or abdomen.  Small hiatal hernia and fat-containing right inguinal hernia  EXAM: NUCLEAR MEDICINE WHOLE BODY BONE SCAN  TECHNIQUE: Whole body anterior and posterior images were obtained approximately 3 hours after intravenous injection of radiopharmaceutical.  RADIOPHARMACEUTICALS:  19.1 mCi Technetium-55m MDP IV  COMPARISON:  Abdomen and pelvis CT obtained today.  FINDINGS: Extensive increased tracer uptake in the medial aspect of the right scapula. Multiple foci of increased tracer uptake in the mid thoracic spine. Focally increased tracer uptake in the right shoulder, most likely in the lateral aspect of the scapula. Additional increased tracer uptake in both shoulders, both wrists, both knees, both ankles and both feet, compatible with degenerative changes. Multiple foci of increased tracer uptake throughout the sternum and manubrium.  Normal renal and bladder activity.  IMPRESSION: 1. Findings compatible with metastatic disease in the right scapula, T6 through T8 levels of the thoracic spine and in the sternum and manubrium. 2. Multi joint degenerative changes.    Impression and Plan:  1. 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 He was initially diagnosed in March 2016 with a Gleason score of 7 and a PSA of 12.8. At that time he had high-risk disease with a large tumor, high volume disease with extracapsular extension. He also had pelvic  adenopathy. He completed the radiation therapy and androgen deprivation in 2016.  His staging workup was repeated on 01/18/2017 including CT scan and a bone scan. His PSA and testosterone were also repeated. These findings were discussed today and reviewed with the patient and his wife. His PSA indicates rather rapid rise with imaging studies show metastatic disease and few areas and to the bone. No visceral metastasis is noted.  It is clear that he is developing advanced disease with a rise in his PSA and metastatic disease to the bone. Based on these findings, I have recommended restarting androgen deprivation therapy as well as adding Zytiga. Risks and benefits of these approaches were reviewed today and complications of these medications were discussed. Comp combination associated with Lupron includes hot flashes, weight gain and erectile dysfunction. Comp issues associated with Zytiga includes weight loss, fatigue and hypokalemia. He has been on these medications before and is willing to resume those.  2. Androgen depravation: His testosterone is elevated and Lupron will be started to 30 mg every 4 months.  3. Electrolyte imbalance: His potassium is within normal range and will be monitored closely on Zytiga.  4. Hypertension: His blood pressure is within normal range and will be monitored on Zytiga.  5. Follow-up: Will be in the next week to receive his Lupron injection and follow-up in 6 weeks    SHADAD,FIRAS, MD 5/15/20189:10 AM

## 2017-01-24 ENCOUNTER — Telehealth: Payer: Self-pay | Admitting: Pharmacist

## 2017-01-24 DIAGNOSIS — C61 Malignant neoplasm of prostate: Secondary | ICD-10-CM

## 2017-01-24 MED ORDER — ABIRATERONE ACETATE 250 MG PO TABS: 1000.0000 mg | ORAL_TABLET | Freq: Every day | ORAL | 0 refills | Status: DC

## 2017-01-24 NOTE — Telephone Encounter (Signed)
Oral Chemotherapy Pharmacist Encounter  Received new prescription for Zytiga for the treatment of metastatic, castration-resistant prostate cancer. Labs from 01/11/17 reviewed, OK for treatment  Current medication list in Epic assessed, DDI with Zytiga identified:  Zytiga and Coreg: Category D interaction. Zytiga inhibits CYP2D6 and may lead to increased exposure to Coreg. BPs and HRs in Epic assessed, will be monitored. No change to current therapy indicated at this time.  No prescription insurance coverage on file in [place]. Prescription has been e-scribed to [facility] for benefits analysis.  Oral Oncology Clinic will continue to follow  Johny Drilling, PharmD, BCPS, BCOP 01/24/2017  3:17 PM Oral Oncology Clinic [phone]

## 2017-01-28 ENCOUNTER — Ambulatory Visit (HOSPITAL_BASED_OUTPATIENT_CLINIC_OR_DEPARTMENT_OTHER): Payer: Medicare Other

## 2017-01-28 VITALS — BP 133/85 | HR 70 | Temp 97.9°F | Resp 20

## 2017-01-28 DIAGNOSIS — Z5111 Encounter for antineoplastic chemotherapy: Secondary | ICD-10-CM

## 2017-01-28 DIAGNOSIS — C61 Malignant neoplasm of prostate: Secondary | ICD-10-CM | POA: Diagnosis not present

## 2017-01-28 DIAGNOSIS — C7951 Secondary malignant neoplasm of bone: Secondary | ICD-10-CM | POA: Diagnosis not present

## 2017-01-28 MED ORDER — LEUPROLIDE ACETATE (4 MONTH) 30 MG IM KIT
30.0000 mg | PACK | Freq: Once | INTRAMUSCULAR | Status: AC
  Administered 2017-01-28: 30 mg via INTRAMUSCULAR
  Filled 2017-01-28: qty 30

## 2017-01-28 NOTE — Patient Instructions (Signed)
Leuprolide depot injection What is this medicine? LEUPROLIDE (loo PROE lide) is a man-made protein that acts like a natural hormone in the body. It decreases testosterone in men and decreases estrogen in women. In men, this medicine is used to treat advanced prostate cancer. In women, some forms of this medicine may be used to treat endometriosis, uterine fibroids, or other male hormone-related problems. This medicine may be used for other purposes; ask your health care provider or pharmacist if you have questions. COMMON BRAND NAME(S): Eligard, Lupron Depot, Lupron Depot-Ped, Viadur What should I tell my health care provider before I take this medicine? They need to know if you have any of these conditions: -diabetes -heart disease or previous heart attack -high blood pressure -high cholesterol -mental illness -osteoporosis -pain or difficulty passing urine -seizures -spinal cord metastasis -stroke -suicidal thoughts, plans, or attempt; a previous suicide attempt by you or a family member -tobacco smoker -unusual vaginal bleeding (women) -an unusual or allergic reaction to leuprolide, benzyl alcohol, other medicines, foods, dyes, or preservatives -pregnant or trying to get pregnant -breast-feeding How should I use this medicine? This medicine is for injection into a muscle or for injection under the skin. It is given by a health care professional in a hospital or clinic setting. The specific product will determine how it will be given to you. Make sure you understand which product you receive and how often you will receive it. Talk to your pediatrician regarding the use of this medicine in children. Special care may be needed. Overdosage: If you think you have taken too much of this medicine contact a poison control center or emergency room at once. NOTE: This medicine is only for you. Do not share this medicine with others. What if I miss a dose? It is important not to miss a dose.  Call your doctor or health care professional if you are unable to keep an appointment. Depot injections: Depot injections are given either once-monthly, every 12 weeks, every 16 weeks, or every 24 weeks depending on the product you are prescribed. The product you are prescribed will be based on if you are male or male, and your condition. Make sure you understand your product and dosing. What may interact with this medicine? Do not take this medicine with any of the following medications: -chasteberry This medicine may also interact with the following medications: -herbal or dietary supplements, like black cohosh or DHEA -male hormones, like estrogens or progestins and birth control pills, patches, rings, or injections -male hormones, like testosterone This list may not describe all possible interactions. Give your health care provider a list of all the medicines, herbs, non-prescription drugs, or dietary supplements you use. Also tell them if you smoke, drink alcohol, or use illegal drugs. Some items may interact with your medicine. What should I watch for while using this medicine? Visit your doctor or health care professional for regular checks on your progress. During the first weeks of treatment, your symptoms may get worse, but then will improve as you continue your treatment. You may get hot flashes, increased bone pain, increased difficulty passing urine, or an aggravation of nerve symptoms. Discuss these effects with your doctor or health care professional, some of them may improve with continued use of this medicine. Male patients may experience a menstrual cycle or spotting during the first months of therapy with this medicine. If this continues, contact your doctor or health care professional. What side effects may I notice from receiving this medicine? Side   effects that you should report to your doctor or health care professional as soon as possible: -allergic reactions like skin  rash, itching or hives, swelling of the face, lips, or tongue -breathing problems -chest pain -depression or memory disorders -pain in your legs or groin -pain at site where injected or implanted -seizures -severe headache -swelling of the feet and legs -suicidal thoughts or other mood changes -visual changes -vomiting Side effects that usually do not require medical attention (report to your doctor or health care professional if they continue or are bothersome): -breast swelling or tenderness -decrease in sex drive or performance -diarrhea -hot flashes -loss of appetite -muscle, joint, or bone pains -nausea -redness or irritation at site where injected or implanted -skin problems or acne This list may not describe all possible side effects. Call your doctor for medical advice about side effects. You may report side effects to FDA at 1-800-FDA-1088. Where should I keep my medicine? This drug is given in a hospital or clinic and will not be stored at home. NOTE: This sheet is a summary. It may not cover all possible information. If you have questions about this medicine, talk to your doctor, pharmacist, or health care provider.  2018 Elsevier/Gold Standard (2016-02-09 09:45:53)  

## 2017-01-29 ENCOUNTER — Encounter: Payer: Self-pay | Admitting: *Deleted

## 2017-01-29 MED ORDER — ABIRATERONE ACETATE 250 MG PO TABS: 1000.0000 mg | ORAL_TABLET | Freq: Every day | ORAL | 0 refills | Status: DC

## 2017-01-29 NOTE — Telephone Encounter (Signed)
Oral Chemotherapy Pharmacist Encounter  Received notification from collaborative practice RN, Randolm Idol, that Zytiga prescription was not at pharmacy. Medication e-scribed to pharmacy this morning. Oral Oncology Clinic will follow closely to expedite process.  Johny Drilling, PharmD, BCPS, BCOP 01/29/2017  8:36 AM Oral Oncology Clinic [phone]

## 2017-01-31 ENCOUNTER — Telehealth: Payer: Self-pay | Admitting: Pharmacist

## 2017-01-31 NOTE — Telephone Encounter (Signed)
Oral Chemotherapy Pharmacist Encounter  I met patient in [facility] lobby on 1/97 to complete application for [place] (JJPA[employer]) for [place].  Completed application faxed to [place] at [phone].  This encounter will continue to be updated until final determination.  Oral Oncology Clinic will continue to follow.   Johny Drilling, PharmD, BCPS, BCOP 01/31/2017  10:27 AM Oral Oncology Clinic [phone]

## 2017-01-31 NOTE — Telephone Encounter (Signed)
Oral Chemotherapy Pharmacist Encounter  Received notification from [place] that patient has no prescription insurance coverage and the office will need to refer patient to manufacturer assistance.  We will begin [place] (JJPAF) application process, which will be documented in a separate encounter.  Oral Oncology Clinic will continue to follow.  Johny Drilling, PharmD, BCPS, BCOP 01/31/2017  10:23 AM Oral Oncology Clinic [phone]

## 2017-03-04 ENCOUNTER — Telehealth: Payer: Self-pay | Admitting: Pharmacy Technician

## 2017-03-04 NOTE — Telephone Encounter (Signed)
Oral Oncology Patient Advocate Encounter  Received notification from [facility] and [place] that patient has been denied enrollment into their program to receive Zytiga from the drug manufacturer.    According to the pharmacy, his initial copayment would be $2839.88. I called and spoke with the patient. I asked him to send/bring me a list of his expenses and I would submit an appeal to [employer] on his behalf.  He will be doing this soon.    Patient knows to call the office with questions or concerns.  Oral Oncology Clinic will continue to follow.  Gilmore Laroche, CPhT, [place] Oral Oncology Patient Advocate [phone] 03/04/2017 2:53 PM

## 2017-03-06 ENCOUNTER — Other Ambulatory Visit: Payer: Medicare Other

## 2017-03-06 ENCOUNTER — Ambulatory Visit: Payer: Medicare Other | Admitting: Oncology

## 2017-03-12 ENCOUNTER — Telehealth: Payer: Self-pay | Admitting: Pharmacy Technician

## 2017-03-12 ENCOUNTER — Telehealth: Payer: Self-pay | Admitting: Pharmacist

## 2017-03-12 MED FILL — ZYTIGA 250 MG TABLET: 250 | 30 days supply | Qty: 120 | Fill #0

## 2017-03-12 NOTE — Telephone Encounter (Signed)
Oral Chemotherapy Pharmacist Encounter  I LVM for patient with offer for initial counseling for Zytiga as we were able to secure copayment grant (updated in a separate encounter) for $0 out of pocket cost for the patient. Prescription is being processed at the [facility].  Oral Oncology Clinic will continue to follow.  Johny Drilling, PharmD, BCPS, BCOP 03/12/2017  9:25 AM Oral Oncology Clinic [phone]

## 2017-03-12 NOTE — Telephone Encounter (Signed)
Oral Oncology Patient Advocate Encounter  Was successful in securing patient a $7,500.00 grant from Patient [facility]) to [place] coverage for his Zytiga.  This will keep the out of pocket expense at $0.    Johny Drilling left a message for the patient with the good news.   The billing information is as follows and has been shared with [place].   Member ID: [id number] Group ID: [id number] RxBin: [id number] Dates of Eligibility: 12/12/2016 through 03/11/2018  Fabio Asa. [name], [place] Oral Oncology Patient Advocate [phone] 03/12/2017 11:34 AM

## 2017-03-14 NOTE — Telephone Encounter (Signed)
Oral Chemotherapy Pharmacist Encounter   I spoke with patient for overview of new oral chemotherapy medication: Zytiga. Pt is doing well. The prescriptions have been sent to the [facility] for benefit analysis and approval. Copayment $0 with foundation grant assistance. Planned start date: 03/14/17  Counseled patient on administration, dosing, side effects, safe handling, and monitoring. Patient will take Zytiga 1000mg  (4 tablets total) by mouth once daily on an empty stomach. Patient states he will take his Zytiga 1st thing in the morning when he wakes up and will eat breakfast 1 hour later.  Side effects include but not limited to: fatigue, joint pain, stomach upset, LE edema, hypertension, and hot flushes.  [name] voiced understanding and appreciation.   All questions answered. [name] knows to call the office with questions or concerns. Next MD appt for 7/17 confirmed with patient.  Oral Oncology Clinic will continue to follow.  Thank you,  Dennis Fischer, PharmD, BCPS, BCOP 03/14/2017  9:49 AM Oral Oncology Clinic [phone]

## 2017-03-26 ENCOUNTER — Other Ambulatory Visit (HOSPITAL_BASED_OUTPATIENT_CLINIC_OR_DEPARTMENT_OTHER): Payer: Medicare Other

## 2017-03-26 ENCOUNTER — Telehealth: Payer: Self-pay | Admitting: Oncology

## 2017-03-26 ENCOUNTER — Ambulatory Visit (HOSPITAL_BASED_OUTPATIENT_CLINIC_OR_DEPARTMENT_OTHER): Payer: Medicare Other | Admitting: Oncology

## 2017-03-26 VITALS — BP 145/74 | HR 72 | Temp 98.9°F | Resp 19 | Ht 71.0 in | Wt 223.9 lb

## 2017-03-26 DIAGNOSIS — E878 Other disorders of electrolyte and fluid balance, not elsewhere classified: Secondary | ICD-10-CM

## 2017-03-26 DIAGNOSIS — C61 Malignant neoplasm of prostate: Secondary | ICD-10-CM | POA: Diagnosis not present

## 2017-03-26 DIAGNOSIS — E291 Testicular hypofunction: Secondary | ICD-10-CM

## 2017-03-26 DIAGNOSIS — I10 Essential (primary) hypertension: Secondary | ICD-10-CM | POA: Diagnosis not present

## 2017-03-26 DIAGNOSIS — C7951 Secondary malignant neoplasm of bone: Secondary | ICD-10-CM | POA: Diagnosis not present

## 2017-03-26 LAB — COMPREHENSIVE METABOLIC PANEL
ALT: 21 U/L (ref 0–55)
AST: 15 U/L (ref 5–34)
Albumin: 3.6 g/dL (ref 3.5–5.0)
Alkaline Phosphatase: 207 U/L — ABNORMAL HIGH (ref 40–150)
Anion Gap: 13 mEq/L — ABNORMAL HIGH (ref 3–11)
BUN: 14.8 mg/dL (ref 7.0–26.0)
CO2: 23 mEq/L (ref 22–29)
Calcium: 9 mg/dL (ref 8.4–10.4)
Chloride: 107 mEq/L (ref 98–109)
Creatinine: 1.2 mg/dL (ref 0.7–1.3)
EGFR: 64 mL/min/{1.73_m2} — ABNORMAL LOW (ref >90)
Glucose: 133 mg/dl (ref 70–140)
Potassium: 3.1 mEq/L — ABNORMAL LOW (ref 3.5–5.1)
Sodium: 143 mEq/L (ref 136–145)
Total Bilirubin: 0.28 mg/dL (ref 0.20–1.20)
Total Protein: 7 g/dL (ref 6.4–8.3)

## 2017-03-26 LAB — CBC WITH DIFFERENTIAL/PLATELET
BASO%: 0.8 % (ref 0.0–2.0)
Basophils Absolute: 0 10*3/uL (ref 0.0–0.1)
EOS%: 8 % — ABNORMAL HIGH (ref 0.0–7.0)
Eosinophils Absolute: 0.5 10*3/uL (ref 0.0–0.5)
HCT: 37.7 % — ABNORMAL LOW (ref 38.4–49.9)
HGB: 12.7 g/dL — ABNORMAL LOW (ref 13.0–17.1)
LYMPH%: 34.2 % (ref 14.0–49.0)
MCH: 29.5 pg (ref 27.2–33.4)
MCHC: 33.7 g/dL (ref 32.0–36.0)
MCV: 87.7 fL (ref 79.3–98.0)
MONO#: 0.4 10*3/uL (ref 0.1–0.9)
MONO%: 6.3 % (ref 0.0–14.0)
NEUT#: 3.1 10*3/uL (ref 1.5–6.5)
NEUT%: 50.7 % (ref 39.0–75.0)
Platelets: 270 10*3/uL (ref 140–400)
RBC: 4.3 10*6/uL (ref 4.20–5.82)
RDW: 15.1 % — ABNORMAL HIGH (ref 11.0–14.6)
WBC: 6.1 10*3/uL (ref 4.0–10.3)
lymph#: 2.1 10*3/uL (ref 0.9–3.3)

## 2017-03-26 NOTE — Progress Notes (Signed)
Hematology and Oncology Follow Up Visit  Adem Costlow [id number] 12/05/1945 71 y.o. 03/26/2017 2:40 PM Elwyn Reach, MDGarba, Henderson Newcomer, MD   Principle Diagnosis: 36 year old gentleman with prostate cancer diagnosed in 2016. He had a Gleason score of 7 and a PSA of 12.8. He is currently has castration resistant disease with pelvic adenopathy.   Prior Therapy: He completed the radiation therapy and androgen deprivation in 2016. His PSA in July 2017 was up to 12 after a nadir of 1.1 with a low testosterone indicating castration resistant disease. He was started on Zytiga and prednisone which he took for about a month and subsequently discontinued.  Current therapy:   Lupron 30 mg daily last dose given on 01/28/2017.  Zytiga 1000 mg daily with prednisone at 5 mg daily started on 03/14/2017.   Interim History:  Mr. Boehringer presents today for a follow-up visit. Since the last visit, he started Zytiga and tolerated it well. He denied any complications related to this medication including fatigue, edema or hypertension. He reports no back pain, shoulder pain or pathological fractures. He remains reasonably active and attends to activities of daily living. His appetite is excellent and his performance status is unchanged. He denied any hematuria or dysuria. His quality of life is not dramatically changed.   He does not report any headaches, blurry vision, syncope or seizures. He does not report any fevers, chills or sweats. He does not report any cough, wheezing or hemoptysis. He does not report any chest pain, palpitation, orthopnea or leg edema. He does not report any frequency urgency or hesitancy. He does not report any musca skeletal complaints. Remaining review of systems unremarkable.   Medications: I have reviewed the patient's current medications.  Current Outpatient Prescriptions  Medication Sig Dispense Refill  . abiraterone Acetate (ZYTIGA) 250 MG tablet Take 4 tablets (1,000 mg  total) by mouth daily. Take on an empty stomach 1 hour before or 2 hours after a meal 120 tablet 0  . Acetaminophen (TYLENOL PO) Take 500 mg by mouth daily as needed.    Marland Kitchen amLODipine (NORVASC) 10 MG tablet Take 10 mg by mouth daily.    Marland Kitchen aspirin 81 MG tablet Take 81 mg by mouth daily.    . carvedilol (COREG) 12.5 MG tablet Take 12.5 mg by mouth 2 (two) times daily.  3  . ergocalciferol (VITAMIN D2) 50000 units capsule Take 50,000 Units by mouth once a week.    . Multiple Vitamin (MULTIVITAMIN) tablet Take 1 tablet by mouth daily.    Marland Kitchen omeprazole (PRILOSEC) 20 MG capsule Take 20 mg by mouth daily.    Marland Kitchen oxybutynin (DITROPAN-XL) 10 MG 24 hr tablet Take 10 mg by mouth at bedtime.    . sildenafil (VIAGRA) 100 MG tablet Take 100 mg by mouth daily as needed for erectile dysfunction.    . simvastatin (ZOCOR) 20 MG tablet Take 20 mg by mouth daily.     No current facility-administered medications for this visit.      Allergies:  Allergies  Allergen Reactions  . Lisinopril Swelling    Facial swelling    Past Medical History, Surgical history, Social history, and Family History were reviewed and updated.    Physical Exam: Blood pressure (!) 145/74, pulse 72, temperature 98.9 F (37.2 C), temperature source Oral, resp. rate 19, height 5\' 11"  (1.803 m), weight 223 lb 14.4 oz (101.6 kg), SpO2 100 %. ECOG: 0 General appearance: Well-appearing gentleman without distress. Head: Normocephalic, without obvious abnormality no oral thrush  or ulcers. Neck: no adenopathy Lymph nodes: Cervical, supraclavicular, and axillary nodes normal. Heart:regular rate and rhythm, S1, S2 normal, no murmur, click, rub or gallop Lung:chest clear, no wheezing, rales, normal symmetric air entry Abdomin: soft, non-tender, without masses or organomegaly no shifting dullness or ascites. EXT:no erythema, induration, or nodules   Lab Results: Lab Results  Component Value Date   WBC 6.1 03/26/2017   HGB 12.7 (L)  03/26/2017   HCT 37.7 (L) 03/26/2017   MCV 87.7 03/26/2017   PLT 270 03/26/2017     Chemistry      Component Value Date/Time   NA 142 01/11/2017 0801   K 4.1 01/11/2017 0801   CO2 23 01/11/2017 0801   BUN 13.6 01/11/2017 0801   CREATININE 1.2 01/11/2017 0801      Component Value Date/Time   CALCIUM 9.5 01/11/2017 0801   ALKPHOS 170 (H) 01/11/2017 0801   AST 15 01/11/2017 0801   ALT 17 01/11/2017 0801   BILITOT 0.46 01/11/2017 0801       Impression and Plan:  1. 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 He was initially diagnosed in March 2016 with a Gleason score of 7 and a PSA of 12.8. At that time he had high-risk disease with a large tumor, high volume disease with extracapsular extension. He also had pelvic adenopathy. He completed the radiation therapy and androgen deprivation in 2016.  His staging workup was repeated on 01/18/2017 showed metastatic disease to the bone with a rapid rise in his PSA up to 61.7.  He is currently on Zytiga 1000 mg daily which she has tolerated it without complications started on March 14, 2017. Risks and benefits of this medication was reviewed today and is agreeable to continue.  2. Androgen depravation: He is currently on Lupron which will be continued indefinitely every 4 months.  3. Electrolyte imbalance: His potassium is within normal range and will be monitored closely on Zytiga.  4. Hypertension: His blood pressure is within normal range and will be monitored on Zytiga.  5. Follow-up: Will be in the next week to receive his Lupron injection and follow-up in 8 weeks    Emery Binz, MD 7/17/20182:40 PM

## 2017-03-26 NOTE — Telephone Encounter (Signed)
Scheduled appt per 7/17 los - Gave patient AVS and calender per los.  

## 2017-03-27 LAB — PSA: Prostate Specific Ag, Serum: 45.2 ng/mL — ABNORMAL HIGH (ref 0.0–4.0)

## 2017-04-08 ENCOUNTER — Other Ambulatory Visit: Payer: Self-pay | Admitting: Oncology

## 2017-04-08 DIAGNOSIS — C61 Malignant neoplasm of prostate: Secondary | ICD-10-CM

## 2017-04-08 MED FILL — ZYTIGA 250 MG TABLET: 250 | 30 days supply | Qty: 120 | Fill #0

## 2017-05-02 ENCOUNTER — Other Ambulatory Visit: Payer: Self-pay | Admitting: Oncology

## 2017-05-02 DIAGNOSIS — C61 Malignant neoplasm of prostate: Secondary | ICD-10-CM

## 2017-05-31 DIAGNOSIS — H25042 Posterior subcapsular polar age-related cataract, left eye: Secondary | ICD-10-CM | POA: Diagnosis not present

## 2017-05-31 DIAGNOSIS — H524 Presbyopia: Secondary | ICD-10-CM | POA: Diagnosis not present

## 2017-05-31 DIAGNOSIS — H25012 Cortical age-related cataract, left eye: Secondary | ICD-10-CM | POA: Diagnosis not present

## 2017-05-31 DIAGNOSIS — H18463 Peripheral corneal degeneration, bilateral: Secondary | ICD-10-CM | POA: Diagnosis not present

## 2017-06-06 ENCOUNTER — Other Ambulatory Visit: Payer: Self-pay | Admitting: Oncology

## 2017-06-06 ENCOUNTER — Ambulatory Visit (HOSPITAL_BASED_OUTPATIENT_CLINIC_OR_DEPARTMENT_OTHER): Payer: Medicare Other | Admitting: Oncology

## 2017-06-06 ENCOUNTER — Telehealth: Payer: Self-pay | Admitting: Oncology

## 2017-06-06 ENCOUNTER — Other Ambulatory Visit (HOSPITAL_BASED_OUTPATIENT_CLINIC_OR_DEPARTMENT_OTHER): Payer: Medicare Other

## 2017-06-06 ENCOUNTER — Other Ambulatory Visit: Payer: Self-pay | Admitting: *Deleted

## 2017-06-06 ENCOUNTER — Ambulatory Visit (HOSPITAL_BASED_OUTPATIENT_CLINIC_OR_DEPARTMENT_OTHER): Payer: Medicare Other

## 2017-06-06 ENCOUNTER — Telehealth: Payer: Self-pay | Admitting: *Deleted

## 2017-06-06 VITALS — BP 145/79 | HR 77 | Temp 98.7°F | Resp 19 | Ht 71.0 in | Wt 212.3 lb

## 2017-06-06 DIAGNOSIS — E291 Testicular hypofunction: Secondary | ICD-10-CM | POA: Diagnosis not present

## 2017-06-06 DIAGNOSIS — C61 Malignant neoplasm of prostate: Secondary | ICD-10-CM

## 2017-06-06 DIAGNOSIS — Z5111 Encounter for antineoplastic chemotherapy: Secondary | ICD-10-CM | POA: Diagnosis present

## 2017-06-06 DIAGNOSIS — C7951 Secondary malignant neoplasm of bone: Secondary | ICD-10-CM | POA: Diagnosis not present

## 2017-06-06 LAB — CBC WITH DIFFERENTIAL/PLATELET
BASO%: 0.3 % (ref 0.0–2.0)
Basophils Absolute: 0 10*3/uL (ref 0.0–0.1)
EOS%: 7.2 % — ABNORMAL HIGH (ref 0.0–7.0)
Eosinophils Absolute: 0.5 10*3/uL (ref 0.0–0.5)
HCT: 37.6 % — ABNORMAL LOW (ref 38.4–49.9)
HGB: 12.6 g/dL — ABNORMAL LOW (ref 13.0–17.1)
LYMPH%: 41.9 % (ref 14.0–49.0)
MCH: 30 pg (ref 27.2–33.4)
MCHC: 33.5 g/dL (ref 32.0–36.0)
MCV: 89.5 fL (ref 79.3–98.0)
MONO#: 0.3 10*3/uL (ref 0.1–0.9)
MONO%: 5.4 % (ref 0.0–14.0)
NEUT#: 2.8 10*3/uL (ref 1.5–6.5)
NEUT%: 45.2 % (ref 39.0–75.0)
Platelets: 248 10*3/uL (ref 140–400)
RBC: 4.2 10*6/uL (ref 4.20–5.82)
RDW: 14.5 % (ref 11.0–14.6)
WBC: 6.3 10*3/uL (ref 4.0–10.3)
lymph#: 2.6 10*3/uL (ref 0.9–3.3)

## 2017-06-06 LAB — COMPREHENSIVE METABOLIC PANEL
ALT: 16 U/L (ref 0–55)
AST: 17 U/L (ref 5–34)
Albumin: 3.8 g/dL (ref 3.5–5.0)
Alkaline Phosphatase: 218 U/L — ABNORMAL HIGH (ref 40–150)
Anion Gap: 9 mEq/L (ref 3–11)
BUN: 13.5 mg/dL (ref 7.0–26.0)
CO2: 26 mEq/L (ref 22–29)
Calcium: 9.3 mg/dL (ref 8.4–10.4)
Chloride: 108 mEq/L (ref 98–109)
Creatinine: 1 mg/dL (ref 0.7–1.3)
EGFR: 76 mL/min/{1.73_m2} — ABNORMAL LOW (ref >90)
Glucose: 121 mg/dl (ref 70–140)
Potassium: 3 mEq/L — CL (ref 3.5–5.1)
Sodium: 143 mEq/L (ref 136–145)
Total Bilirubin: 0.41 mg/dL (ref 0.20–1.20)
Total Protein: 7.4 g/dL (ref 6.4–8.3)

## 2017-06-06 MED ORDER — POTASSIUM CHLORIDE CRYS ER 20 MEQ PO TBCR: 20.0000 meq | EXTENDED_RELEASE_TABLET | Freq: Every day | ORAL | 0 refills | Status: DC

## 2017-06-06 MED ORDER — LEUPROLIDE ACETATE (4 MONTH) 30 MG IM KIT
30.0000 mg | PACK | Freq: Once | INTRAMUSCULAR | Status: AC
  Administered 2017-06-06: 30 mg via INTRAMUSCULAR
  Filled 2017-06-06: qty 30

## 2017-06-06 MED ORDER — ABIRATERONE ACETATE 250 MG PO TABS: ORAL_TABLET | ORAL | 1 refills | Status: DC

## 2017-06-06 MED FILL — ZYTIGA 250 MG TABLET: 250 | 30 days supply | Qty: 120 | Fill #0

## 2017-06-06 NOTE — Telephone Encounter (Signed)
Lm on answering machine, re:low potassium and script called in to [facility] [address], [place].

## 2017-06-06 NOTE — Progress Notes (Signed)
Hematology and Oncology Follow Up Visit  Dennis Fischer [id number] February 26, 1946 71 y.o. 06/06/2017 1:08 PM Dennis Fischer, MDGarba, Dennis Newcomer, Dennis Fischer   Principle Diagnosis: 71 year old gentleman with prostate cancer diagnosed in 2016. He had a Gleason score of 7 and a PSA of 12.8. He is currently has castration resistant disease with pelvic adenopathy.   Prior Therapy: He completed the radiation therapy and androgen deprivation in 2016. His PSA in July 2017 was up to 12 after a nadir of 1.1 with a low testosterone indicating castration resistant disease. He was started on Zytiga and prednisone which he took for about a month and subsequently discontinued.  Current therapy:   Lupron 30 mg daily last dose given on 01/28/2017.  Zytiga 1000 mg daily with prednisone at 5 mg daily restarted on 03/14/2017.   Interim History:  Dennis Fischer presents today for a follow-up visit. Since the last visit, he reports no major changes in his health. He continues to take Zytiga and tolerated it well. He denied any complications including fatigue, edema or hypertension. He reports no back pain, shoulder pain or pathological fractures. He remains active and attends to activities of daily living. His appetite is excellent although he lost some weight. and his performance status is unchanged. He denied any hematuria or dysuria. His mobility is unchanged.  He does not report any headaches, blurry vision, syncope or seizures. He does not report any fevers, chills or sweats. He does not report any cough, wheezing or hemoptysis. He does not report any chest pain, palpitation, orthopnea or leg edema. He does not report any frequency urgency or hesitancy. He does not report any musca skeletal complaints. Remaining review of systems unremarkable.   Medications: I have reviewed the patient's current medications.  Current Outpatient Prescriptions  Medication Sig Dispense Refill  . abiraterone Acetate (ZYTIGA) 250 MG tablet  TAKE 4 TABLETS BY MOUTH DAILY. TAKE ON AN EMPTY STOMACH 1 HOUR BEFORE OR 2 HOURS AFTER A MEAL 120 tablet 1  . Acetaminophen (TYLENOL PO) Take 500 mg by mouth daily as needed.    Marland Kitchen amLODipine (NORVASC) 10 MG tablet Take 10 mg by mouth daily.    Marland Kitchen aspirin 81 MG tablet Take 81 mg by mouth daily.    . carvedilol (COREG) 12.5 MG tablet Take 12.5 mg by mouth 2 (two) times daily.  3  . ergocalciferol (VITAMIN D2) 50000 units capsule Take 50,000 Units by mouth once a week.    . Multiple Vitamin (MULTIVITAMIN) tablet Take 1 tablet by mouth daily.    Marland Kitchen omeprazole (PRILOSEC) 20 MG capsule Take 20 mg by mouth daily.    . sildenafil (VIAGRA) 100 MG tablet Take 100 mg by mouth daily as needed for erectile dysfunction.    . simvastatin (ZOCOR) 20 MG tablet Take 20 mg by mouth daily.     No current facility-administered medications for this visit.    Facility-Administered Medications Ordered in Other Visits  Medication Dose Route Frequency Provider Last Rate Last Dose  . leuprolide (LUPRON) injection 30 mg  30 mg Intramuscular Once Dennis Portela, Dennis Fischer         Allergies:  Allergies  Allergen Reactions  . Lisinopril Swelling    Facial swelling    Past Medical History, Surgical history, Social history, and Family History were reviewed and updated.    Physical Exam: Blood pressure (!) 145/79, pulse 77, temperature 98.7 F (37.1 C), temperature source Oral, resp. rate 19, height 5\' 11"  (1.803 m), weight 212 lb 4.8  oz (96.3 kg), SpO2 100 %. ECOG: 0 General appearance: Alert, awake gentleman without distress. Head: Normocephalic, without obvious abnormality no oral ulcers or lesions. Neck: no adenopathy no masses or lesions. Lymph nodes: Cervical, supraclavicular, and axillary nodes normal. Heart:regular rate and rhythm, S1, S2 normal, no murmur, click, rub or gallop Lung:chest clear, no wheezing, rales, normal symmetric air entry Abdomin: soft, non-tender, without masses or organomegaly no rebound  or guarding. EXT:no erythema, induration, or nodules   Lab Results: Lab Results  Component Value Date   WBC 6.3 06/06/2017   HGB 12.6 (L) 06/06/2017   HCT 37.6 (L) 06/06/2017   MCV 89.5 06/06/2017   PLT 248 06/06/2017     Chemistry      Component Value Date/Time   NA 143 03/26/2017 1418   K 3.1 (L) 03/26/2017 1418   CO2 23 03/26/2017 1418   BUN 14.8 03/26/2017 1418   CREATININE 1.2 03/26/2017 1418      Component Value Date/Time   CALCIUM 9.0 03/26/2017 1418   ALKPHOS 207 (H) 03/26/2017 1418   AST 15 03/26/2017 1418   ALT 21 03/26/2017 1418   BILITOT 0.28 03/26/2017 1418     Results for NICHOLOS, ALOISI (MRN [id number]) as of 06/06/2017 12:53  Ref. Range 01/11/2017 08:01 03/26/2017 14:18  Prostate Specific Ag, Serum Latest Ref Range: 0.0 - 4.0 ng/mL 61.7 (H) 45.2 (H)    Impression and Plan:  1. 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 He was initially diagnosed in March 2016 with a Gleason score of 7 and a PSA of 12.8. At that time he had high-risk disease with a large tumor, high volume disease with extracapsular extension. He also had pelvic adenopathy. He completed the radiation therapy and androgen deprivation in 2016.  His staging workup was repeated on 01/18/2017 showed metastatic disease to the bone with a rapid rise in his PSA up to 61.7.  He is currently on Zytiga 1000 mg daily which she has tolerated it without complications started on March 14, 2017.   His PSA after a few weeks of therapy showed reasonable decline indicating response to therapy.   2. Androgen depravation: He is currently on Lupron which will be continued indefinitely every 4 months. His next injection will be on 06/06/2017.  3. Electrolyte imbalance: His potassium is mildly low and we'll continue to monitor.  4. Hypertension: His blood pressure is within normal range and will be monitored on Zytiga.  5. Follow-up: In 2 months to follow his progress and in 4  months to receive a Lupron injection.    Dennis Button, Dennis Fischer 9/27/20181:08 PM

## 2017-06-06 NOTE — Telephone Encounter (Signed)
Gave patient AVS and calendar of upcoming November appointments.  °

## 2017-06-07 ENCOUNTER — Telehealth: Payer: Self-pay | Admitting: *Deleted

## 2017-06-07 LAB — PSA: Prostate Specific Ag, Serum: 30.1 ng/mL — ABNORMAL HIGH (ref 0.0–4.0)

## 2017-06-07 NOTE — Telephone Encounter (Signed)
Spoke with patient, gave results of last PSA. Confirmed that patient was taking k-dur that was called in yesterday.

## 2017-06-07 NOTE — Telephone Encounter (Signed)
-----   Message from Dennis Portela, MD sent at 06/07/2017  8:14 AM EDT ----- Please let him know his PSA is down

## 2017-06-25 DIAGNOSIS — H25012 Cortical age-related cataract, left eye: Secondary | ICD-10-CM | POA: Diagnosis not present

## 2017-06-25 DIAGNOSIS — H25812 Combined forms of age-related cataract, left eye: Secondary | ICD-10-CM | POA: Diagnosis not present

## 2017-06-25 DIAGNOSIS — H2512 Age-related nuclear cataract, left eye: Secondary | ICD-10-CM | POA: Diagnosis not present

## 2017-06-25 DIAGNOSIS — H25042 Posterior subcapsular polar age-related cataract, left eye: Secondary | ICD-10-CM | POA: Diagnosis not present

## 2017-07-04 MED FILL — ZYTIGA 250 MG TABLET: 250 | 30 days supply | Qty: 120 | Fill #1

## 2017-07-15 IMAGING — CR DG HIP (WITH OR WITHOUT PELVIS) 2-3V*R*
3 series · 3 of 3 positions shown · non-contrast
Comparison: CT abdomen and pelvis [DATE].

CLINICAL DATA: Chronic right hip pain.  No known injury.

EXAM:
DG HIP (WITH OR WITHOUT PELVIS) 2-3V RIGHT

[w pelvis upright]
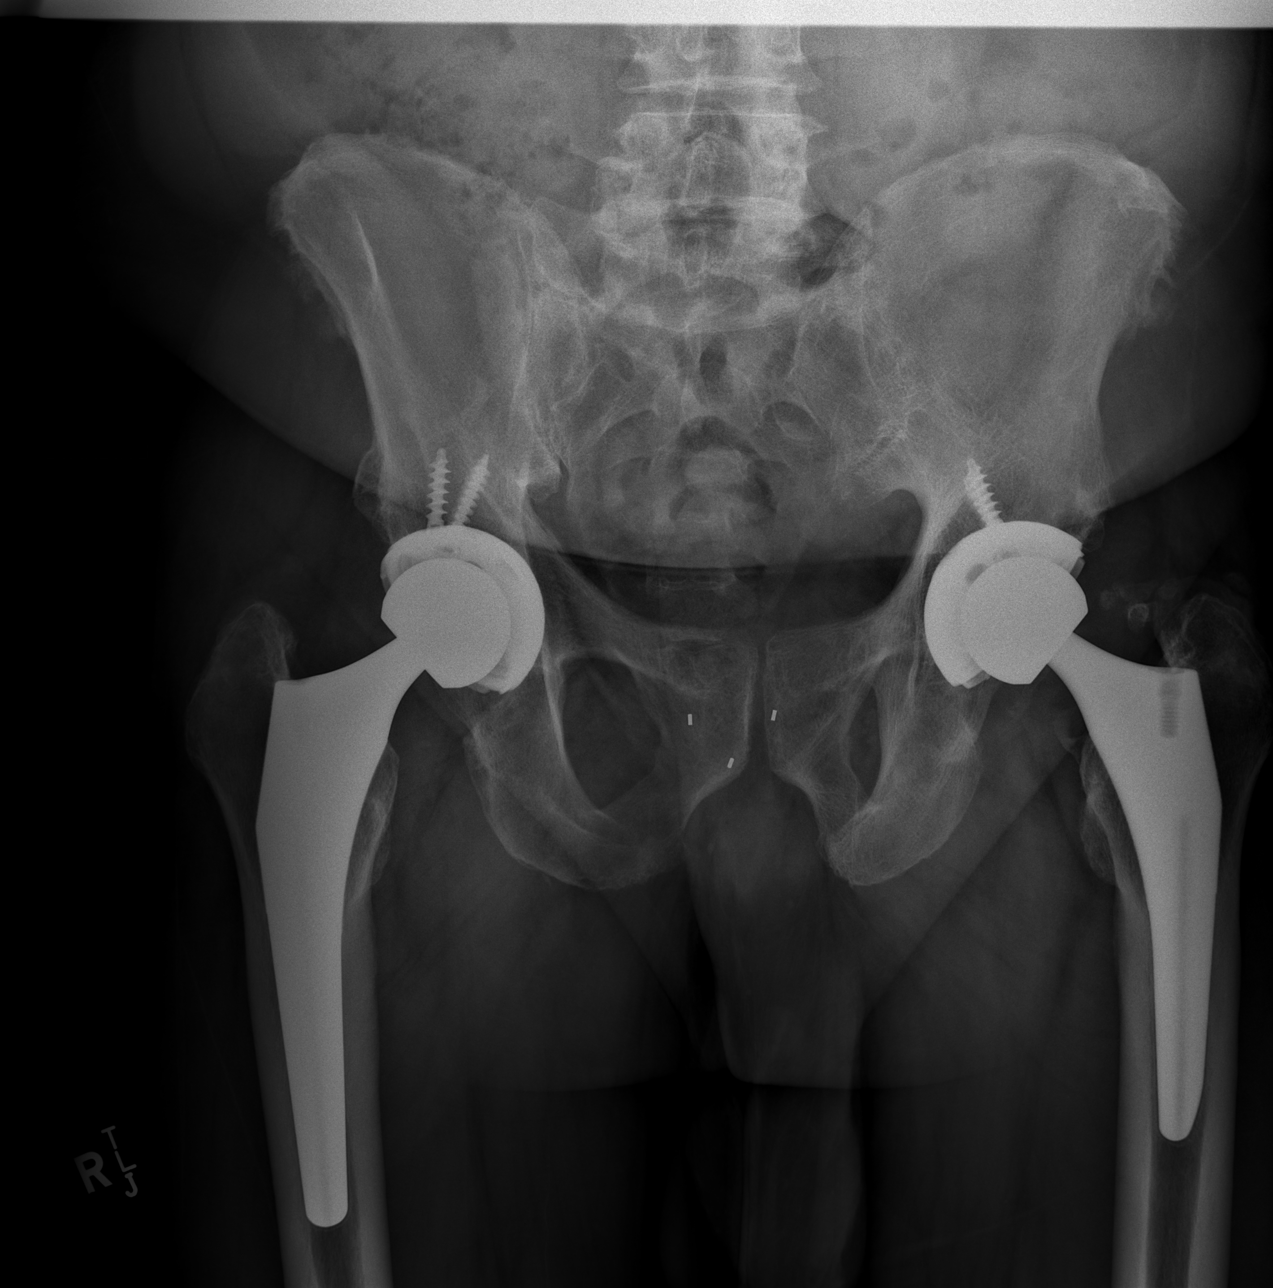

[w hip ap right]
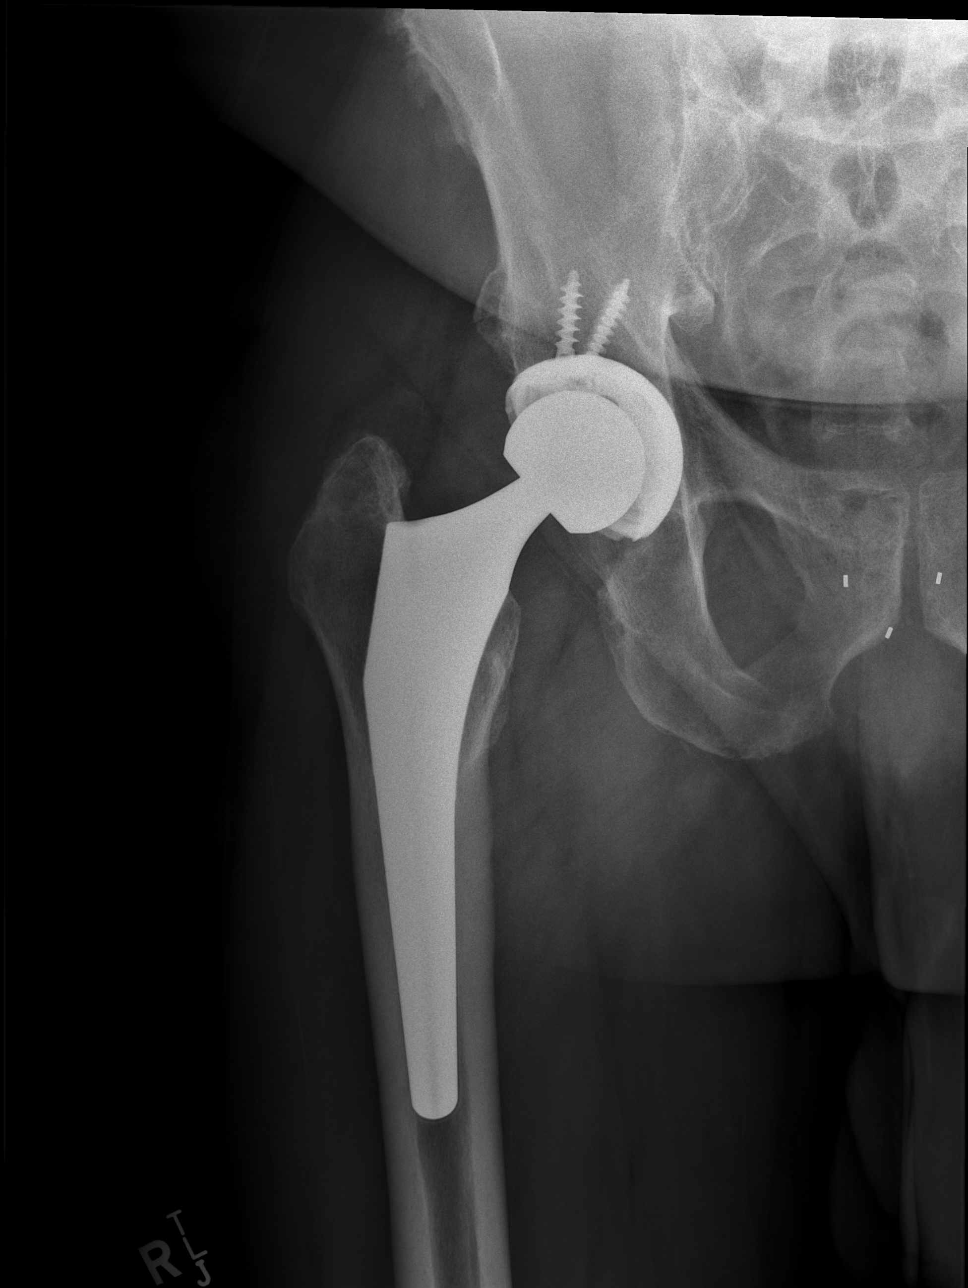

[w hip frog right]
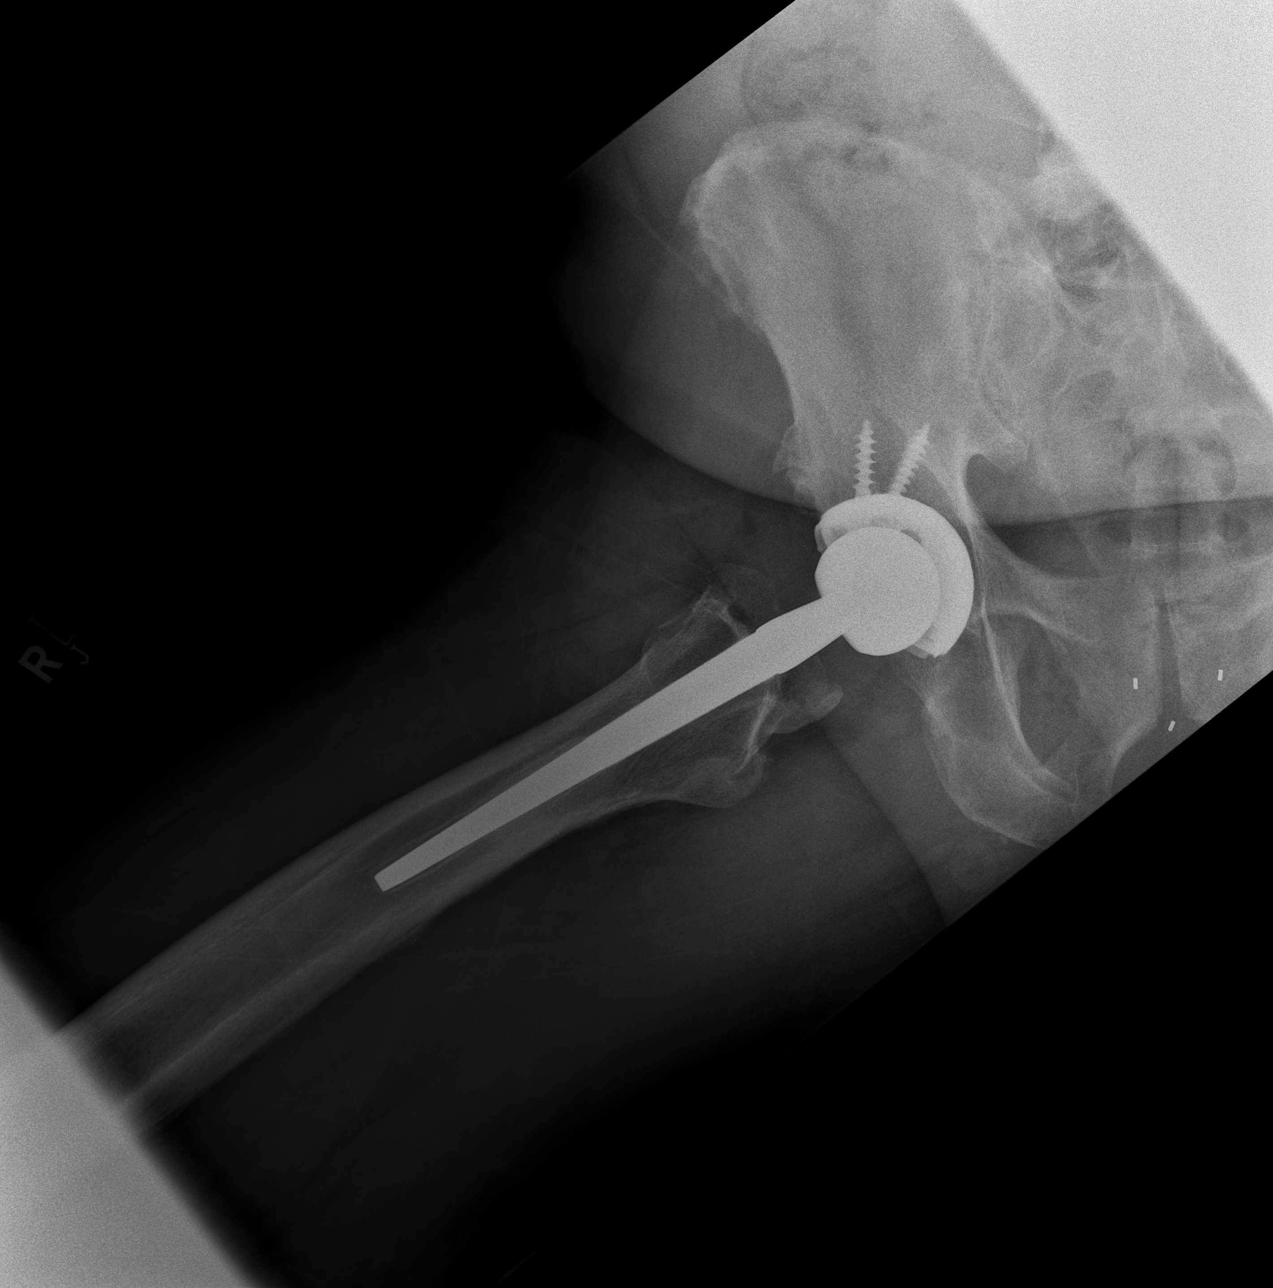

[3 of 3 positions shown; findings below may reference images not displayed]

FINDINGS: The patient has bilateral total hip replacements as seen on the
prior examination. No acute bony or joint abnormality is seen. No
hardware complication.
IMPRESSION: Negative exam. Status post bilateral hip replacement without
evidence of complication.

## 2017-07-25 ENCOUNTER — Other Ambulatory Visit: Payer: Self-pay | Admitting: Oncology

## 2017-07-25 DIAGNOSIS — C61 Malignant neoplasm of prostate: Secondary | ICD-10-CM

## 2017-08-02 MED FILL — ZYTIGA 250 MG TABLET: 250 | 30 days supply | Qty: 120 | Fill #0

## 2017-08-08 ENCOUNTER — Other Ambulatory Visit (HOSPITAL_BASED_OUTPATIENT_CLINIC_OR_DEPARTMENT_OTHER): Payer: Medicare Other

## 2017-08-08 ENCOUNTER — Ambulatory Visit (HOSPITAL_BASED_OUTPATIENT_CLINIC_OR_DEPARTMENT_OTHER): Payer: Medicare Other | Admitting: Oncology

## 2017-08-08 ENCOUNTER — Telehealth: Payer: Self-pay | Admitting: Oncology

## 2017-08-08 VITALS — BP 155/95 | HR 71 | Temp 98.6°F | Resp 18 | Ht 71.0 in | Wt 219.0 lb

## 2017-08-08 DIAGNOSIS — E291 Testicular hypofunction: Secondary | ICD-10-CM | POA: Diagnosis not present

## 2017-08-08 DIAGNOSIS — I10 Essential (primary) hypertension: Secondary | ICD-10-CM

## 2017-08-08 DIAGNOSIS — C7951 Secondary malignant neoplasm of bone: Secondary | ICD-10-CM

## 2017-08-08 DIAGNOSIS — C61 Malignant neoplasm of prostate: Secondary | ICD-10-CM

## 2017-08-08 LAB — COMPREHENSIVE METABOLIC PANEL
ALT: 17 U/L (ref 0–55)
AST: 17 U/L (ref 5–34)
Albumin: 3.9 g/dL (ref 3.5–5.0)
Alkaline Phosphatase: 214 U/L — ABNORMAL HIGH (ref 40–150)
Anion Gap: 9 mEq/L (ref 3–11)
BUN: 13.2 mg/dL (ref 7.0–26.0)
CO2: 24 mEq/L (ref 22–29)
Calcium: 9.1 mg/dL (ref 8.4–10.4)
Chloride: 108 mEq/L (ref 98–109)
Creatinine: 1.1 mg/dL (ref 0.7–1.3)
EGFR: >60 mL/min/{1.73_m2} (ref >60)
Glucose: 100 mg/dl (ref 70–140)
Potassium: 3.6 mEq/L (ref 3.5–5.1)
Sodium: 141 mEq/L (ref 136–145)
Total Bilirubin: 0.29 mg/dL (ref 0.20–1.20)
Total Protein: 7.5 g/dL (ref 6.4–8.3)

## 2017-08-08 LAB — CBC WITH DIFFERENTIAL/PLATELET
BASO%: 0.2 % (ref 0.0–2.0)
Basophils Absolute: 0 10*3/uL (ref 0.0–0.1)
EOS%: 5.7 % (ref 0.0–7.0)
Eosinophils Absolute: 0.3 10*3/uL (ref 0.0–0.5)
HCT: 38.7 % (ref 38.4–49.9)
HGB: 12.9 g/dL — ABNORMAL LOW (ref 13.0–17.1)
LYMPH%: 38.7 % (ref 14.0–49.0)
MCH: 29.8 pg (ref 27.2–33.4)
MCHC: 33.3 g/dL (ref 32.0–36.0)
MCV: 89.4 fL (ref 79.3–98.0)
MONO#: 0.3 10*3/uL (ref 0.1–0.9)
MONO%: 6.5 % (ref 0.0–14.0)
NEUT#: 2.6 10*3/uL (ref 1.5–6.5)
NEUT%: 48.9 % (ref 39.0–75.0)
Platelets: 243 10*3/uL (ref 140–400)
RBC: 4.33 10*6/uL (ref 4.20–5.82)
RDW: 14.1 % (ref 11.0–14.6)
WBC: 5.3 10*3/uL (ref 4.0–10.3)
lymph#: 2 10*3/uL (ref 0.9–3.3)

## 2017-08-08 NOTE — Telephone Encounter (Signed)
Gave avs and calendar for January 2019 °

## 2017-08-08 NOTE — Progress Notes (Signed)
Hematology and Oncology Follow Up Visit  Dennis Fischer [id number] 12-28-1945 71 y.o. 08/08/2017 11:49 AM Elwyn Reach, MDGarba, Henderson Newcomer, MD   Principle Diagnosis: 5 year old gentleman with prostate cancer diagnosed in 2016. He had a Gleason score of 7 and a PSA of 12.8. He is currently has castration resistant disease with pelvic adenopathy.   Prior Therapy: He completed the radiation therapy and androgen deprivation in 2016. His PSA in July 2017 was up to 12 after a nadir of 1.1 with a low testosterone indicating castration resistant disease. He was started on Zytiga and prednisone which he took for about a month and subsequently discontinued.  Current therapy:   Lupron 30 mg daily last dose given on 01/28/2017.  Zytiga 1000 mg daily with prednisone at 5 mg daily restarted on 03/14/2017.   Interim History:  Dennis Fischer presents today for a follow-up visit. Since the last visit, he reports doing well without any complaints. He continues to take Zytiga and any complications including fatigue, edema or hypertension. He reports no back pain, shoulder pain or pathological fractures. He remains active and attends to activities of daily living.  He continues to take potassium supplements without any issues.  His appetite is excellent and his weight is stable. and his performance status is unchanged. He denied any hematuria or dysuria.  His quality of life remain unchanged.  He does not report any headaches, blurry vision, syncope or seizures. He does not report any fevers, chills or sweats. He does not report any cough, wheezing or hemoptysis. He does not report any chest pain, palpitation, orthopnea or leg edema. He does not report any frequency urgency or hesitancy. He does not report any musca skeletal complaints. Remaining review of systems unremarkable.   Medications: I have reviewed the patient's current medications.  Current Outpatient Medications  Medication Sig Dispense Refill   . abiraterone Acetate (ZYTIGA) 250 MG tablet TAKE 4 TABLETS BY MOUTH DAILY. TAKE ON AN EMPTY STOMACH 1 HOUR BEFORE OR 2 HOURS AFTER A MEAL 120 tablet 1  . Acetaminophen (TYLENOL PO) Take 500 mg by mouth daily as needed.    Marland Kitchen amLODipine (NORVASC) 10 MG tablet Take 10 mg by mouth daily.    Marland Kitchen aspirin 81 MG tablet Take 81 mg by mouth daily.    . carvedilol (COREG) 12.5 MG tablet Take 12.5 mg by mouth 2 (two) times daily.  3  . ergocalciferol (VITAMIN D2) 50000 units capsule Take 50,000 Units by mouth once a week.    . Multiple Vitamin (MULTIVITAMIN) tablet Take 1 tablet by mouth daily.    Marland Kitchen omeprazole (PRILOSEC) 20 MG capsule Take 20 mg by mouth daily.    . potassium chloride SA (K-DUR,KLOR-CON) 20 MEQ tablet TAKE 1 TABLET(20 MEQ) BY MOUTH DAILY 90 tablet 0  . sildenafil (VIAGRA) 100 MG tablet Take 100 mg by mouth daily as needed for erectile dysfunction.    . simvastatin (ZOCOR) 20 MG tablet Take 20 mg by mouth daily.    Marland Kitchen ZYTIGA 250 MG tablet TAKE 4 TABLETS BY MOUTH DAILY. TAKE ON AN EMPTY STOMACH 1 HOUR BEFORE OR 2 HOURS AFTER A MEAL 120 tablet 1   No current facility-administered medications for this visit.      Allergies:  Allergies  Allergen Reactions  . Lisinopril Swelling    Facial swelling    Past Medical History, Surgical history, Social history, and Family History were reviewed and updated.    Physical Exam: Blood pressure (!) 155/95, pulse 71, temperature 98.6  F (37 C), temperature source Oral, resp. rate 18, height 5\' 11"  (1.803 m), weight 219 lb (99.3 kg), SpO2 100 %. ECOG: 0 General appearance: Well-appearing gentleman without distress. Head: Normocephalic, without obvious abnormality no oral rash or ulcers. Neck: no adenopathy no masses or lesions. Lymph nodes: Cervical, supraclavicular, and axillary nodes normal. Heart:regular rate and rhythm, S1, S2 normal, no murmur, click, rub or gallop Lung:chest clear, no wheezing, rales, normal symmetric air entry Abdomin:  soft, non-tender, without masses or organomegaly no shifting dullness or ascites. EXT:no erythema, induration, or nodules   Lab Results: Lab Results  Component Value Date   WBC 5.3 08/08/2017   HGB 12.9 (L) 08/08/2017   HCT 38.7 08/08/2017   MCV 89.4 08/08/2017   PLT 243 08/08/2017     Chemistry      Component Value Date/Time   NA 143 06/06/2017 1246   K 3.0 (LL) 06/06/2017 1246   CO2 26 06/06/2017 1246   BUN 13.5 06/06/2017 1246   CREATININE 1.0 06/06/2017 1246      Component Value Date/Time   CALCIUM 9.3 06/06/2017 1246   ALKPHOS 218 (H) 06/06/2017 1246   AST 17 06/06/2017 1246   ALT 16 06/06/2017 1246   BILITOT 0.41 06/06/2017 1246     Results for Dennis Fischer, Dennis Fischer (MRN [id number]) as of 08/08/2017 11:38  Ref. Range 01/11/2017 08:01 03/26/2017 14:18 06/06/2017 12:46  Prostate Specific Ag, Serum Latest Ref Range: 0.0 - 4.0 ng/mL 61.7 (H) 45.2 (H) 30.1 (H)     Impression and Plan:  1. 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 He was initially diagnosed in March 2016 with a Gleason score of 7 and a PSA of 12.8. At that time he had high-risk disease with a large tumor, high volume disease with extracapsular extension. He also had pelvic adenopathy. He completed the radiation therapy and androgen deprivation in 2016.  His staging workup was repeated on 01/18/2017 showed metastatic disease to the bone with a rapid rise in his PSA up to 61.7.  He is currently on Zytiga 1000 mg daily which she has tolerated it without complications started on March 14, 2017.   His PSA he needs to show excellent response to therapy and currently at 30.1.  Risks and benefits of continuing this medication was reviewed today and is agreeable to continue.   2. Androgen depravation: He is currently on Lupron which will be continued indefinitely every 4 months. His next injection will be on October 10, 2017.  3. Electrolyte imbalance: His potassium is is low on currently  on replacement.  His potassium will be rechecked today.  4. Hypertension: His blood pressure is within normal range and will be monitored on Zytiga.  5. Follow-up: In 2 months to follow his progress and receive Lupron injection at the time.    Zola Button, MD 11/29/201811:49 AM

## 2017-08-09 LAB — PSA: Prostate Specific Ag, Serum: 35.1 ng/mL — ABNORMAL HIGH (ref 0.0–4.0)

## 2017-08-26 ENCOUNTER — Encounter (INDEPENDENT_AMBULATORY_CARE_PROVIDER_SITE_OTHER): Payer: Self-pay

## 2017-09-03 ENCOUNTER — Other Ambulatory Visit: Payer: Self-pay | Admitting: Oncology

## 2017-09-04 ENCOUNTER — Emergency Department (HOSPITAL_COMMUNITY): Payer: Medicare Other

## 2017-09-04 ENCOUNTER — Encounter (HOSPITAL_COMMUNITY): Payer: Self-pay | Admitting: Family Medicine

## 2017-09-04 ENCOUNTER — Emergency Department (HOSPITAL_COMMUNITY)
Admission: EM | Admit: 2017-09-04 | Discharge: 2017-09-05 | Disposition: A | Discharge status: Home / Self Care | Payer: Medicare Other | Attending: Emergency Medicine | Admitting: Emergency Medicine

## 2017-09-04 DIAGNOSIS — R509 Fever, unspecified: Secondary | ICD-10-CM

## 2017-09-04 DIAGNOSIS — N390 Urinary tract infection, site not specified: Secondary | ICD-10-CM | POA: Diagnosis not present

## 2017-09-04 DIAGNOSIS — R062 Wheezing: Secondary | ICD-10-CM | POA: Insufficient documentation

## 2017-09-04 DIAGNOSIS — C61 Malignant neoplasm of prostate: Secondary | ICD-10-CM | POA: Insufficient documentation

## 2017-09-04 DIAGNOSIS — Z79899 Other long term (current) drug therapy: Secondary | ICD-10-CM | POA: Diagnosis not present

## 2017-09-04 DIAGNOSIS — Z7982 Long term (current) use of aspirin: Secondary | ICD-10-CM | POA: Diagnosis not present

## 2017-09-04 DIAGNOSIS — Z87891 Personal history of nicotine dependence: Secondary | ICD-10-CM | POA: Diagnosis not present

## 2017-09-04 DIAGNOSIS — I10 Essential (primary) hypertension: Secondary | ICD-10-CM | POA: Insufficient documentation

## 2017-09-04 HISTORY — DX: Essential (primary) hypertension: I10

## 2017-09-04 LAB — CBC WITH DIFFERENTIAL/PLATELET
Basophils Absolute: 0 10*3/uL (ref 0.0–0.1)
Basophils Relative: 0 %
Eosinophils Absolute: 0.3 10*3/uL (ref 0.0–0.7)
Eosinophils Relative: 5 %
HCT: 35.8 % — ABNORMAL LOW (ref 39.0–52.0)
Hemoglobin: 12.1 g/dL — ABNORMAL LOW (ref 13.0–17.0)
Lymphocytes Relative: 13 %
Lymphs Abs: 0.8 10*3/uL (ref 0.7–4.0)
MCH: 29.9 pg (ref 26.0–34.0)
MCHC: 33.8 g/dL (ref 30.0–36.0)
MCV: 88.4 fL (ref 78.0–100.0)
Monocytes Absolute: 0.5 10*3/uL (ref 0.1–1.0)
Monocytes Relative: 9 %
Neutro Abs: 4.3 10*3/uL (ref 1.7–7.7)
Neutrophils Relative %: 73 %
Platelets: 225 10*3/uL (ref 150–400)
RBC: 4.05 MIL/uL — ABNORMAL LOW (ref 4.22–5.81)
RDW: 14.5 % (ref 11.5–15.5)
WBC: 5.9 10*3/uL (ref 4.0–10.5)

## 2017-09-04 LAB — COMPREHENSIVE METABOLIC PANEL
ALT: 19 U/L (ref 17–63)
AST: 23 U/L (ref 15–41)
Albumin: 4 g/dL (ref 3.5–5.0)
Alkaline Phosphatase: 168 U/L — ABNORMAL HIGH (ref 38–126)
Anion gap: 8 (ref 5–15)
BUN: 15 mg/dL (ref 6–20)
CO2: 22 mmol/L (ref 22–32)
Calcium: 8.7 mg/dL — ABNORMAL LOW (ref 8.9–10.3)
Chloride: 105 mmol/L (ref 101–111)
Creatinine, Ser: 1 mg/dL (ref 0.61–1.24)
GFR calc Af Amer: >60 mL/min (ref >60)
GFR calc non Af Amer: >60 mL/min (ref >60)
Glucose, Bld: 96 mg/dL (ref 65–99)
Potassium: 3.2 mmol/L — ABNORMAL LOW (ref 3.5–5.1)
Sodium: 135 mmol/L (ref 135–145)
Total Bilirubin: 0.6 mg/dL (ref 0.3–1.2)
Total Protein: 7.3 g/dL (ref 6.5–8.1)

## 2017-09-04 LAB — URINALYSIS, ROUTINE W REFLEX MICROSCOPIC
Bilirubin Urine: NEGATIVE
Glucose, UA: NEGATIVE mg/dL
Hgb urine dipstick: NEGATIVE
Ketones, ur: NEGATIVE mg/dL
Nitrite: NEGATIVE
Protein, ur: NEGATIVE mg/dL
Specific Gravity, Urine: 1.023 (ref 1.005–1.030)
pH: 5 (ref 5.0–8.0)

## 2017-09-04 LAB — I-STAT CG4 LACTIC ACID, ED
Lactic Acid, Venous: 0.65 mmol/L (ref 0.5–1.9)
Lactic Acid, Venous: 0.96 mmol/L (ref 0.5–1.9)

## 2017-09-04 LAB — PROTIME-INR
INR: 1.12
Prothrombin Time: 14.3 seconds (ref 11.4–15.2)

## 2017-09-04 IMAGING — CR DG CHEST 2V
2 series · 2 of 2 positions shown · non-contrast
Comparison: None.

CLINICAL DATA: Fever.  Currently chemotherapy for prostate cancer.

EXAM:
CHEST  2 VIEW

[w chest pa]
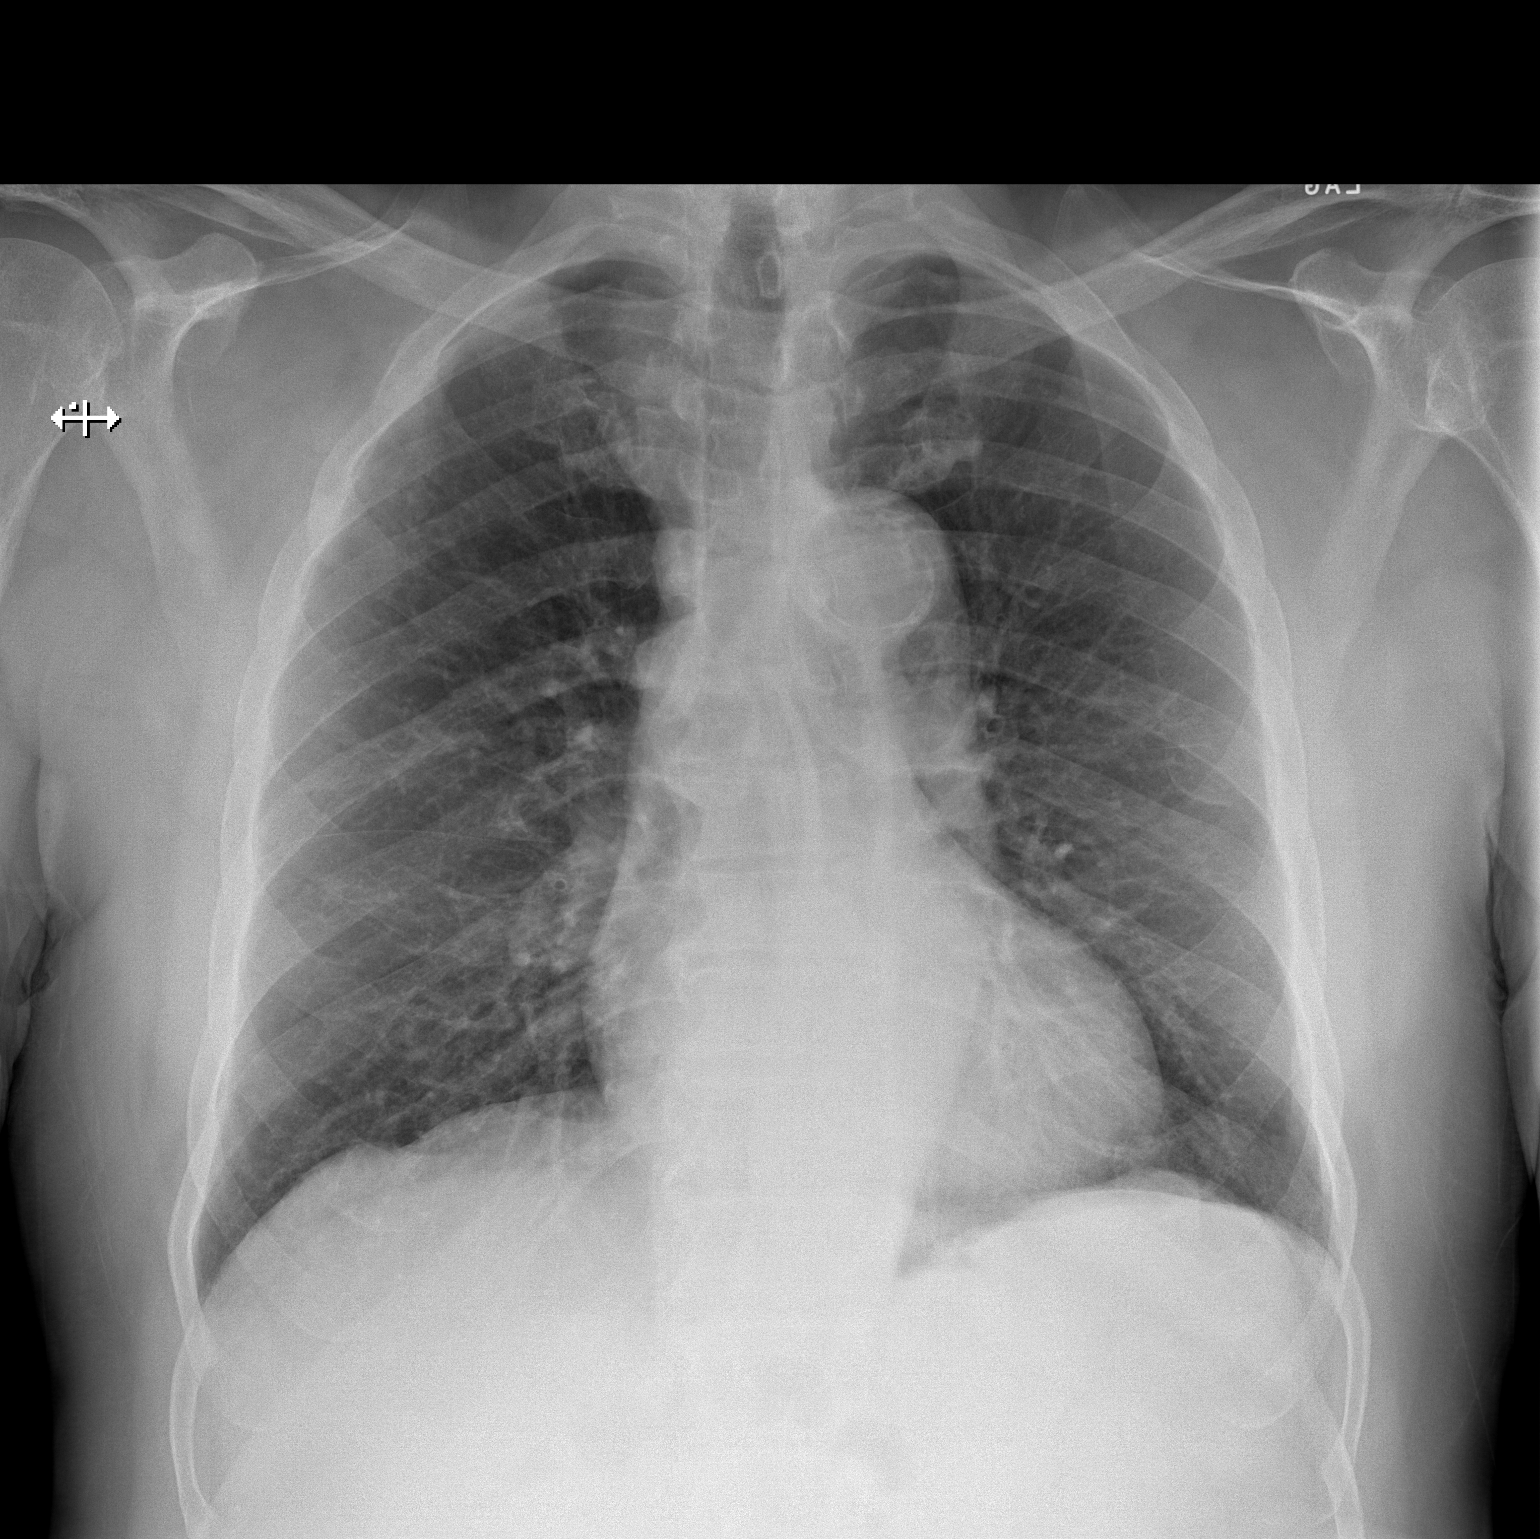

[w chest lat]
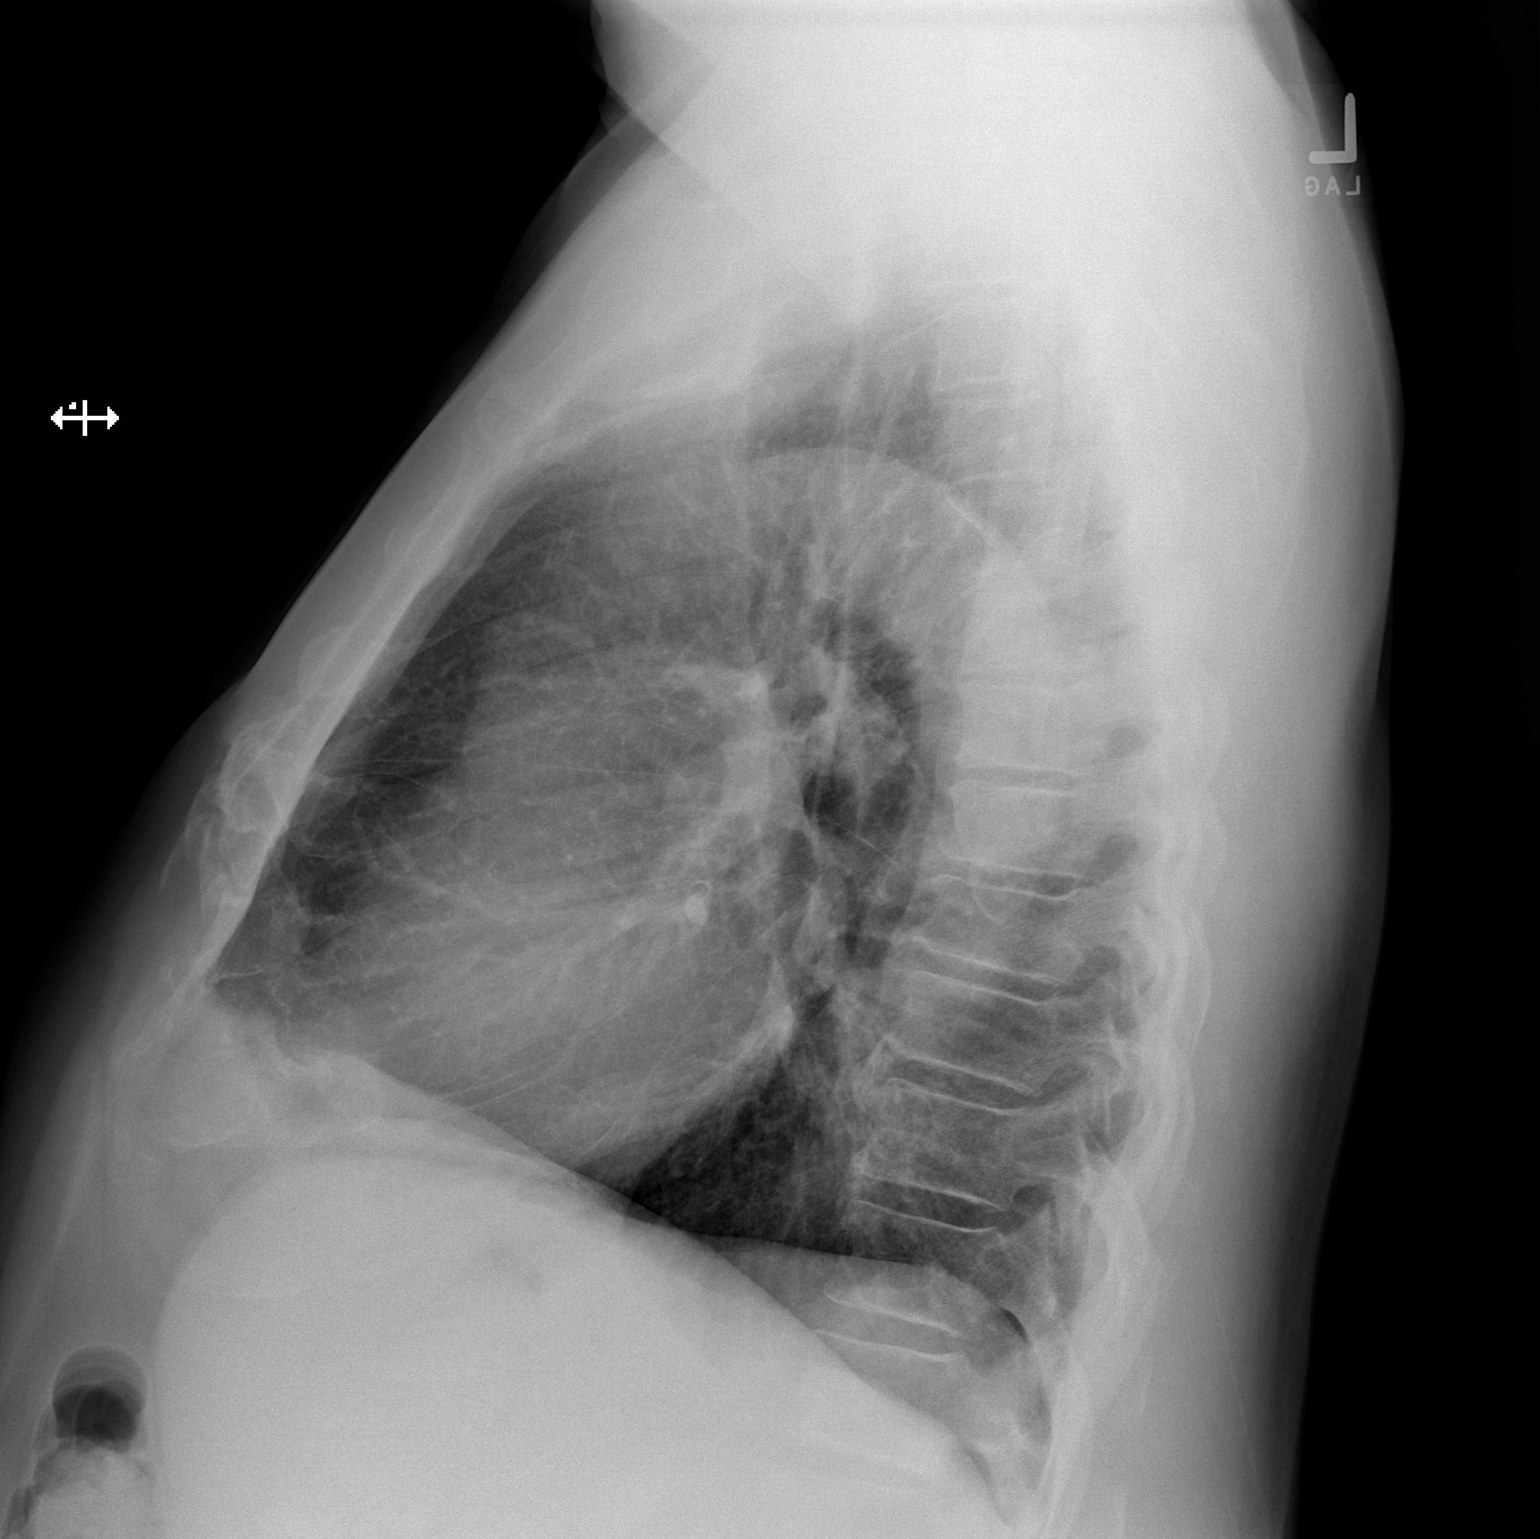

[2 of 2 positions shown; findings below may reference images not displayed]

FINDINGS: Heart size and mediastinal contours are within normal limits.
Atherosclerotic changes noted at the aortic arch.

Lungs are clear. No pleural effusion or pneumothorax seen. No acute
or suspicious osseous finding.
IMPRESSION: No active cardiopulmonary disease. No evidence of pneumonia or
pulmonary edema.

Aortic atherosclerosis.

## 2017-09-04 MED ORDER — CEFTRIAXONE SODIUM 1 G IJ SOLR
1.0000 g | Freq: Once | INTRAMUSCULAR | Status: AC
  Administered 2017-09-04: 1 g via INTRAVENOUS
  Filled 2017-09-04: qty 10

## 2017-09-04 MED ORDER — ALBUTEROL SULFATE (2.5 MG/3ML) 0.083% IN NEBU
5.0000 mg | INHALATION_SOLUTION | Freq: Once | RESPIRATORY_TRACT | Status: AC
  Administered 2017-09-04: 5 mg via RESPIRATORY_TRACT
  Filled 2017-09-04: qty 6

## 2017-09-04 MED ORDER — SODIUM CHLORIDE 0.9 % IV BOLUS (SEPSIS)
1000.0000 mL | Freq: Once | INTRAVENOUS | Status: AC
  Administered 2017-09-04: 1000 mL via INTRAVENOUS

## 2017-09-04 MED ORDER — ACETAMINOPHEN 325 MG PO TABS
650.0000 mg | ORAL_TABLET | Freq: Once | ORAL | Status: AC | PRN
  Administered 2017-09-04: 650 mg via ORAL
  Filled 2017-09-04: qty 2

## 2017-09-04 MED FILL — ZYTIGA 250 MG TABLET: 250 | 30 days supply | Qty: 120 | Fill #1

## 2017-09-04 NOTE — ED Triage Notes (Signed)
Patient reports he has prostate cancer and receiving chemotherapy. He reports he has had a fever but not measured at home and cough. Pt took a BC powder around 18:00. Also, has a cough.

## 2017-09-04 NOTE — ED Provider Notes (Signed)
[facility] DEPT Provider Note   CSN: [id number] Arrival date & time: 09/04/17  2011     History   Chief Complaint Chief Complaint  Patient presents with  . Fever  . Cough    HPI Dennis Fischer is a 71 y.o. male with a hx of prostate cancer (on daily oral chemotherapy), HTN presents to the Emergency Department complaining of gradual, persistent, progressively worsening fever onset 6pm.  No thermometer at home, but wife reports pt was "burning up."  She reports pt has been sluggish today, but without specific complaints.  Pt reports a congested cough for several weeks.  Wife reports they have both had coughs for several weeks.  He denies other associated symptoms.  Nothing seems to make the symptoms better or worse.  Wife reports that pt felt so bad this morning he did not complete his usual exercise routine.  Pt denies dysuria, hematuria, urinary frequency or urgency.  Pt reports hx of prostate radiation in 2016.  No recent instrumentation.  The history is provided by the patient and medical records. No language interpreter was used.    Past Medical History:  Diagnosis Date  . Cancer [facility])    Prostate with chemotherapy  . Hypertension     Patient Active Problem List   Diagnosis Date Noted  . Prostate cancer ([place]) 01/22/2017    Past Surgical History:  Procedure Laterality Date  . HIP SURGERY Bilateral        Home Medications    Prior to Admission medications   Medication Sig Start Date End Date Taking? Authorizing Provider  amLODipine (NORVASC) 10 MG tablet Take 10 mg by mouth daily. 07/10/16  Yes [provider]  aspirin 81 MG tablet Take 81 mg by mouth daily. 07/10/16  Yes [provider]  carvedilol (COREG) 12.5 MG tablet Take 12.5 mg by mouth 2 (two) times daily. 06/20/16  Yes [provider]  omeprazole (PRILOSEC) 20 MG capsule Take 20 mg by mouth daily. 07/10/16  Yes [provider]  potassium  chloride SA (K-DUR,KLOR-CON) 20 MEQ tablet TAKE 1 TABLET(20 MEQ) BY MOUTH DAILY 09/03/17  Yes Wyatt Portela, MD  simvastatin (ZOCOR) 20 MG tablet Take 20 mg by mouth daily. 07/10/16  Yes [provider]  ZYTIGA 250 MG tablet TAKE 4 TABLETS BY MOUTH DAILY. TAKE ON AN EMPTY STOMACH 1 HOUR BEFORE OR 2 HOURS AFTER A MEAL 07/25/17  Yes Wyatt Portela, MD  abiraterone Acetate (ZYTIGA) 250 MG tablet TAKE 4 TABLETS BY MOUTH DAILY. TAKE ON AN EMPTY STOMACH 1 HOUR BEFORE OR 2 HOURS AFTER A MEAL Patient not taking: Reported on 09/04/2017 06/06/17   Wyatt Portela, MD  Acetaminophen (TYLENOL PO) Take 500 mg by mouth daily as needed. 07/10/16   [provider]  ergocalciferol (VITAMIN D2) 50000 units capsule Take 50,000 Units by mouth once a week. 07/10/16   [provider]  levofloxacin (LEVAQUIN) 750 MG tablet Take 1 tablet (750 mg total) by mouth daily. X 7 days 09/05/17   Zona Pedro, Jarrett Soho, PA-C  Multiple Vitamin (MULTIVITAMIN) tablet Take 1 tablet by mouth daily. 07/10/16   [provider]  sildenafil (VIAGRA) 100 MG tablet Take 100 mg by mouth daily as needed for erectile dysfunction. 07/10/16   [provider]    Family History History reviewed. No pertinent family history.  Social History Social History   Tobacco Use  . Smoking status: Former Research scientist (life sciences)  . Smokeless tobacco: Never Used  Substance Use Topics  .  Alcohol use: Yes    Comment: Once every 2 weeks.   . Drug use: Yes    Types: Marijuana    Comment: Medical      Allergies   Lisinopril   Review of Systems Review of Systems  Constitutional: Positive for fatigue and fever. Negative for appetite change, diaphoresis and unexpected weight change.  HENT: Negative for mouth sores.   Eyes: Negative for visual disturbance.  Respiratory: Positive for cough. Negative for chest tightness, shortness of breath and wheezing.   Cardiovascular: Negative for chest pain.  Gastrointestinal:  Negative for abdominal pain, constipation, diarrhea, nausea and vomiting.  Endocrine: Negative for polydipsia, polyphagia and polyuria.  Genitourinary: Negative for dysuria, frequency, hematuria and urgency.  Musculoskeletal: Negative for back pain and neck stiffness.  Skin: Negative for rash.  Allergic/Immunologic: Negative for immunocompromised state.  Neurological: Negative for syncope, light-headedness and headaches.  Hematological: Does not bruise/bleed easily.  Psychiatric/Behavioral: Negative for sleep disturbance. The patient is not nervous/anxious.      Physical Exam Updated Vital Signs BP 133/85 (BP Location: Left Arm)   Pulse 81   Temp 99.4 F (37.4 C) (Oral)   Resp (!) 22   Ht 5\' 9"  (1.753 m)   Wt 101.6 kg (224 lb)   SpO2 98%   BMI 33.08 kg/m   Physical Exam  Constitutional: He appears well-developed and well-nourished. No distress.  Awake, alert, nontoxic appearance  HENT:  Head: Normocephalic and atraumatic.  Mouth/Throat: Oropharynx is clear and moist. No oropharyngeal exudate.  Eyes: Conjunctivae are normal. No scleral icterus.  Neck: Normal range of motion. Neck supple.  Cardiovascular: Normal rate, regular rhythm and intact distal pulses.  Pulmonary/Chest: Effort normal. Tachypnea noted. No respiratory distress. He has wheezes ( Fine, expiratory) in the left upper field and the left lower field.  Equal chest expansion  Abdominal: Soft. Bowel sounds are normal. He exhibits no mass. There is no tenderness. There is no rebound and no guarding.  Musculoskeletal: Normal range of motion. He exhibits no edema.  Neurological: He is alert.  Speech is clear and goal oriented Moves extremities without ataxia  Skin: Skin is warm and dry. He is not diaphoretic.  Hot to touch  Psychiatric: He has a normal mood and affect.  Nursing note and vitals reviewed.    ED Treatments / Results  Labs (all labs ordered are listed, but only abnormal results are  displayed) Labs Reviewed  COMPREHENSIVE METABOLIC PANEL - Abnormal; Notable for the following components:      Result Value   Potassium 3.2 (*)    Calcium 8.7 (*)    Alkaline Phosphatase 168 (*)    All other components within normal limits  CBC WITH DIFFERENTIAL/PLATELET - Abnormal; Notable for the following components:   RBC 4.05 (*)    Hemoglobin 12.1 (*)    HCT 35.8 (*)    All other components within normal limits  URINALYSIS, ROUTINE W REFLEX MICROSCOPIC - Abnormal; Notable for the following components:   APPearance HAZY (*)    Leukocytes, UA MODERATE (*)    Bacteria, UA MANY (*)    Squamous Epithelial / LPF 0-5 (*)    All other components within normal limits  CULTURE, BLOOD (ROUTINE X 2)  CULTURE, BLOOD (ROUTINE X 2)  URINE CULTURE  PROTIME-INR  I-STAT CG4 LACTIC ACID, ED  I-STAT CG4 LACTIC ACID, ED     Radiology Dg Chest 2 View  Result Date: 09/04/2017 CLINICAL DATA:  Fever.  Currently chemotherapy for prostate cancer. EXAM:  CHEST  2 VIEW COMPARISON:  None. FINDINGS: Heart size and mediastinal contours are within normal limits. Atherosclerotic changes noted at the aortic arch. Lungs are clear. No pleural effusion or pneumothorax seen. No acute or suspicious osseous finding. IMPRESSION: No active cardiopulmonary disease. No evidence of pneumonia or pulmonary edema. Aortic atherosclerosis. Electronically Signed   By: Franki Cabot M.D.   On: 09/04/2017 20:58    Procedures Procedures (including critical care time)  Medications Ordered in ED Medications  ibuprofen (ADVIL,MOTRIN) tablet 600 mg (not administered)  acetaminophen (TYLENOL) tablet 650 mg (650 mg Oral Given 09/04/17 2038)  sodium chloride 0.9 % bolus 1,000 mL (1,000 mLs Intravenous New Bag/Given 09/04/17 2309)  albuterol (PROVENTIL) (2.5 MG/3ML) 0.083% nebulizer solution 5 mg (5 mg Nebulization Given 09/04/17 2310)  cefTRIAXone (ROCEPHIN) 1 g in dextrose 5 % 50 mL IVPB (0 g Intravenous Stopped 09/04/17 2339)      Initial Impression / Assessment and Plan / ED Course  I have reviewed the triage vital signs and the nursing notes.  Pertinent labs & imaging results that were available during my care of the patient were reviewed by me and considered in my medical decision making (see chart for details).  Clinical Course as of Sep 05 57  Thu Sep 05, 2017  0051 The patient was discussed with and seen by Dr. Rogene Houston who agrees with the treatment plan for .   [HM]    Clinical Course User Index [HM] Aura Bibby, Jarrett Soho, [place]    Patient presents with fever and fatigue onset today.  He is taking oral chemotherapy for his prostate cancer.  No recent instrumentation.  Evidence of urinary tract infection.  Urine culture and blood culture sent.  Fever controlled in the emergency department and patient given fluids.  Patient reports some persistent cough over the last several weeks and focal breath sounds including wheezing on the left.  Chest x-ray does not show evidence of pneumonia.  Patient given Rocephin here in the emergency department and will be discharged home with prescription for Levaquin to cover urinary tract infection and potential community-acquired pneumonia.  Discussed this with patient and family.  They are comfortable with this plan.  Patient is to call his primary care and oncologist in the morning to discuss the situation and schedule close follow-up.  Discussed reasons to return immediately to the emergency department including worsening fevers, difficulty breathing, vomiting, abdominal pain or other concerns.  Final Clinical Impressions(s) / ED Diagnoses   Final diagnoses:  Fever, unspecified fever cause  Urinary tract infection without hematuria, site unspecified    ED Discharge Orders        Ordered    levofloxacin (LEVAQUIN) 750 MG tablet  Daily     09/05/17 0049       Caspar Favila, Jarrett Soho, PA-C 09/05/17 0100    Fredia Sorrow, MD 09/05/17 [phone]

## 2017-09-05 DIAGNOSIS — R509 Fever, unspecified: Secondary | ICD-10-CM | POA: Diagnosis not present

## 2017-09-05 MED ORDER — IBUPROFEN 200 MG PO TABS
600.0000 mg | ORAL_TABLET | Freq: Once | ORAL | Status: AC
  Administered 2017-09-05: 600 mg via ORAL
  Filled 2017-09-05: qty 3

## 2017-09-05 MED ORDER — LEVOFLOXACIN 750 MG PO TABS: 750.0000 mg | ORAL_TABLET | Freq: Every day | ORAL | 0 refills | Status: DC

## 2017-09-05 NOTE — Discharge Instructions (Signed)
1. Medications: Levoquin, usual home medications, alternate Tylenol and Motrin for fever control 2. Treatment: rest, drink plenty of fluids, take medications as prescribed 3. Follow Up: Please call your primary care doctor and Dr. Alen Blew to discuss today's emergency room visit and to schedule close follow-up.  Return to the ER for fevers, persistent vomiting, worsening abdominal pain or other concerning symptoms.

## 2017-09-05 NOTE — ED Provider Notes (Signed)
Medical screening examination/treatment/procedure(s) were conducted as a shared visit with non-physician practitioner(s) and myself.  I personally evaluated the patient during the encounter.   EKG Interpretation None       Results for orders placed or performed during the hospital encounter of 09/04/17  Comprehensive metabolic panel  Result Value Ref Range   Sodium 135 135 - 145 mmol/L   Potassium 3.2 (L) 3.5 - 5.1 mmol/L   Chloride 105 101 - 111 mmol/L   CO2 22 22 - 32 mmol/L   Glucose, Bld 96 65 - 99 mg/dL   BUN 15 6 - 20 mg/dL   Creatinine, Ser 1.00 0.61 - 1.24 mg/dL   Calcium 8.7 (L) 8.9 - 10.3 mg/dL   Total Protein 7.3 6.5 - 8.1 g/dL   Albumin 4.0 3.5 - 5.0 g/dL   AST 23 15 - 41 U/L   ALT 19 17 - 63 U/L   Alkaline Phosphatase 168 (H) 38 - 126 U/L   Total Bilirubin 0.6 0.3 - 1.2 mg/dL   GFR calc non Af Amer >60 >60 mL/min   GFR calc Af Amer >60 >60 mL/min   Anion gap 8 5 - 15  CBC with Differential  Result Value Ref Range   WBC 5.9 4.0 - 10.5 K/uL   RBC 4.05 (L) 4.22 - 5.81 MIL/uL   Hemoglobin 12.1 (L) 13.0 - 17.0 g/dL   HCT 35.8 (L) 39.0 - 52.0 %   MCV 88.4 78.0 - 100.0 fL   MCH 29.9 26.0 - 34.0 pg   MCHC 33.8 30.0 - 36.0 g/dL   RDW 14.5 11.5 - 15.5 %   Platelets 225 150 - 400 K/uL   Neutrophils Relative % 73 %   Neutro Abs 4.3 1.7 - 7.7 K/uL   Lymphocytes Relative 13 %   Lymphs Abs 0.8 0.7 - 4.0 K/uL   Monocytes Relative 9 %   Monocytes Absolute 0.5 0.1 - 1.0 K/uL   Eosinophils Relative 5 %   Eosinophils Absolute 0.3 0.0 - 0.7 K/uL   Basophils Relative 0 %   Basophils Absolute 0.0 0.0 - 0.1 K/uL  Protime-INR  Result Value Ref Range   Prothrombin Time 14.3 11.4 - 15.2 seconds   INR 1.12   Urinalysis, Routine w reflex microscopic  Result Value Ref Range   Color, Urine YELLOW YELLOW   APPearance HAZY (A) CLEAR   Specific Gravity, Urine 1.023 1.005 - 1.030   pH 5.0 5.0 - 8.0   Glucose, UA NEGATIVE NEGATIVE mg/dL   Hgb urine dipstick NEGATIVE NEGATIVE   Bilirubin Urine NEGATIVE NEGATIVE   Ketones, ur NEGATIVE NEGATIVE mg/dL   Protein, ur NEGATIVE NEGATIVE mg/dL   Nitrite NEGATIVE NEGATIVE   Leukocytes, UA MODERATE (A) NEGATIVE   RBC / HPF 0-5 0 - 5 RBC/hpf   WBC, UA TOO NUMEROUS TO COUNT 0 - 5 WBC/hpf   Bacteria, UA MANY (A) NONE SEEN   Squamous Epithelial / LPF 0-5 (A) NONE SEEN   Mucus PRESENT    Hyaline Casts, UA PRESENT   I-Stat CG4 Lactic Acid, ED  Result Value Ref Range   Lactic Acid, Venous 0.65 0.5 - 1.9 mmol/L  I-Stat CG4 Lactic Acid, ED  Result Value Ref Range   Lactic Acid, Venous 0.96 0.5 - 1.9 mmol/L   Dg Chest 2 View  Result Date: 09/04/2017 CLINICAL DATA:  Fever.  Currently chemotherapy for prostate cancer. EXAM: CHEST  2 VIEW COMPARISON:  None. FINDINGS: Heart size and mediastinal contours are within normal limits.  Atherosclerotic changes noted at the aortic arch. Lungs are clear. No pleural effusion or pneumothorax seen. No acute or suspicious osseous finding. IMPRESSION: No active cardiopulmonary disease. No evidence of pneumonia or pulmonary edema. Aortic atherosclerosis. Electronically Signed   By: Franki Cabot M.D.   On: 09/04/2017 20:58    Patient seen by me along with the physician assistant.  Patient has a history of prostate cancer.  Receiving chemotherapy for that.  He presents with a feeling of fever.  But not documented at home.  And a cough.  Patient took a BC powder around 1800 today.  Cough is been ongoing for several days.  Temp here was 100.7.  Not tachycardic not hypotensive.   Workup shows evidence of urinary tract infection.  Urine culture is been sent.  Lactic acid is normal.  Chest x-ray negative for pneumonia.  No significant leukocytosis.  Patient clinically very stable.  Overall feels better.  Patient did apparently have some wheezing was given breathing treatment and wheezing has resolved.  Will be treated with Levaquin which will cover prostate urine and even long if there is evidence of  developing pneumonia.  Patient will return if he does not improve over the next few days.  I do feel the patient is stable for discharge home and outpatient treatment.  Does not feel that he requires admission.  Oxygen saturations in the upper 90s.   Fredia Sorrow, MD 09/05/17 [phone]

## 2017-09-07 LAB — URINE CULTURE: Culture: >=100000 — AB

## 2017-09-08 ENCOUNTER — Telehealth: Payer: Self-pay

## 2017-09-08 NOTE — Progress Notes (Signed)
ED Antimicrobial Stewardship Positive Culture Follow Up   Dennis Fischer is an 71 y.o. male who presented to [facility] on 09/04/2017 with a chief complaint of  Chief Complaint  Patient presents with  . Fever  . Cough    Recent Results (from the past 720 hour(s))  Culture, blood (Routine x 2)     Status: None (Preliminary result)   Collection Time: 09/04/17  9:37 PM  Result Value Ref Range Status   Specimen Description BLOOD LEFT WRIST  Final   Special Requests   Final    BOTTLES DRAWN AEROBIC AND ANAEROBIC Blood Culture adequate volume   Culture   Final    NO GROWTH 2 DAYS Performed at [facility], [address], [place], [place] [address]    Report Status PENDING  Incomplete  Culture, blood (Routine x 2)     Status: None (Preliminary result)   Collection Time: 09/04/17  9:37 PM  Result Value Ref Range Status   Specimen Description BLOOD RIGHT HAND  Final   Special Requests   Final    BOTTLES DRAWN AEROBIC AND ANAEROBIC Blood Culture adequate volume   Culture   Final    NO GROWTH 2 DAYS Performed at [facility], [place] [address], [place],  [address]    Report Status PENDING  Incomplete  Urine culture     Status: Abnormal   Collection Time: 09/04/17 10:14 PM  Result Value Ref Range Status   Specimen Description URINE, CLEAN CATCH  Final   Special Requests NONE  Final   Culture >=100,000 COLONIES/mL ESCHERICHIA COLI (A)  Final   Report Status 09/07/2017 FINAL  Final   Organism ID, Bacteria ESCHERICHIA COLI (A)  Final      Susceptibility   Escherichia coli - MIC*    AMPICILLIN >=32 RESISTANT Resistant     CEFAZOLIN <=4 SENSITIVE Sensitive     CEFTRIAXONE <=1 SENSITIVE Sensitive     CIPROFLOXACIN >=4 RESISTANT Resistant     GENTAMICIN <=1 SENSITIVE Sensitive     IMIPENEM <=0.25 SENSITIVE Sensitive     NITROFURANTOIN <=16 SENSITIVE Sensitive     TRIMETH/SULFA <=20 SENSITIVE Sensitive     AMPICILLIN/SULBACTAM 4 SENSITIVE Sensitive     PIP/TAZO <=4  SENSITIVE Sensitive     Extended ESBL NEGATIVE Sensitive     * >=100,000 COLONIES/mL ESCHERICHIA COLI    [x]  Treated with Levaquin, organism resistant to prescribed antimicrobial []  Patient discharged originally without antimicrobial agent and treatment is now indicated  New antibiotic prescription:  Call patient If patient improving, finish out course of Levaquin.  If no improvement, stop Levaquin. Start Cephalexin 500 mg BID X 7 days.  ED Provider: Kennith Maes, PA-C   Susa Raring, PharmD, BCPS PGY2 Infectious Diseases Pharmacy Resident  09/08/2017, 9:10 AM Phone# [phone]

## 2017-09-10 LAB — CULTURE, BLOOD (ROUTINE X 2)
Culture: NO GROWTH
Culture: NO GROWTH
Special Requests: ADEQUATE
Special Requests: ADEQUATE

## 2017-09-17 ENCOUNTER — Telehealth: Payer: Self-pay | Admitting: Emergency Medicine

## 2017-09-23 ENCOUNTER — Other Ambulatory Visit: Payer: Self-pay | Admitting: Oncology

## 2017-09-23 DIAGNOSIS — C61 Malignant neoplasm of prostate: Secondary | ICD-10-CM

## 2017-09-27 ENCOUNTER — Other Ambulatory Visit: Payer: Self-pay | Admitting: Pharmacist

## 2017-09-27 DIAGNOSIS — C61 Malignant neoplasm of prostate: Secondary | ICD-10-CM

## 2017-09-27 MED ORDER — ABIRATERONE ACETATE 250 MG PO TABS: 1000.0000 mg | ORAL_TABLET | Freq: Every day | ORAL | 1 refills | Status: DC

## 2017-10-10 ENCOUNTER — Telehealth: Payer: Self-pay | Admitting: Oncology

## 2017-10-10 ENCOUNTER — Inpatient Hospital Stay: Payer: Medicare Other

## 2017-10-10 ENCOUNTER — Inpatient Hospital Stay: Payer: Medicare Other | Attending: Oncology | Admitting: Oncology

## 2017-10-10 VITALS — BP 139/109 | HR 77 | Temp 97.6°F | Resp 18 | Ht 69.0 in | Wt 222.2 lb

## 2017-10-10 DIAGNOSIS — Z7982 Long term (current) use of aspirin: Secondary | ICD-10-CM | POA: Insufficient documentation

## 2017-10-10 DIAGNOSIS — Z79818 Long term (current) use of other agents affecting estrogen receptors and estrogen levels: Secondary | ICD-10-CM | POA: Diagnosis not present

## 2017-10-10 DIAGNOSIS — Z79899 Other long term (current) drug therapy: Secondary | ICD-10-CM | POA: Insufficient documentation

## 2017-10-10 DIAGNOSIS — C61 Malignant neoplasm of prostate: Secondary | ICD-10-CM | POA: Insufficient documentation

## 2017-10-10 DIAGNOSIS — I10 Essential (primary) hypertension: Secondary | ICD-10-CM | POA: Insufficient documentation

## 2017-10-10 DIAGNOSIS — C7951 Secondary malignant neoplasm of bone: Secondary | ICD-10-CM | POA: Insufficient documentation

## 2017-10-10 DIAGNOSIS — E876 Hypokalemia: Secondary | ICD-10-CM | POA: Insufficient documentation

## 2017-10-10 LAB — COMPREHENSIVE METABOLIC PANEL
ALT: 17 U/L (ref 0–55)
AST: 16 U/L (ref 5–34)
Albumin: 3.8 g/dL (ref 3.5–5.0)
Alkaline Phosphatase: 216 U/L — ABNORMAL HIGH (ref 40–150)
Anion gap: 9 (ref 3–11)
BUN: 11 mg/dL (ref 7–26)
CO2: 25 mmol/L (ref 22–29)
Calcium: 9.1 mg/dL (ref 8.4–10.4)
Chloride: 107 mmol/L (ref 98–109)
Creatinine, Ser: 1.04 mg/dL (ref 0.70–1.30)
GFR calc Af Amer: >60 mL/min (ref >60)
GFR calc non Af Amer: >60 mL/min (ref >60)
Glucose, Bld: 110 mg/dL (ref 70–140)
Potassium: 3.4 mmol/L — ABNORMAL LOW (ref 3.5–5.1)
Sodium: 141 mmol/L (ref 136–145)
Total Bilirubin: 0.5 mg/dL (ref 0.2–1.2)
Total Protein: 7.4 g/dL (ref 6.4–8.3)

## 2017-10-10 LAB — CBC WITH DIFFERENTIAL/PLATELET
Basophils Absolute: 0.1 10*3/uL (ref 0.0–0.1)
Basophils Relative: 1 %
Eosinophils Absolute: 0.8 10*3/uL — ABNORMAL HIGH (ref 0.0–0.5)
Eosinophils Relative: 13 %
HCT: 38.3 % — ABNORMAL LOW (ref 38.4–49.9)
Hemoglobin: 12.6 g/dL — ABNORMAL LOW (ref 13.0–17.1)
Lymphocytes Relative: 32 %
Lymphs Abs: 1.9 10*3/uL (ref 0.9–3.3)
MCH: 29.3 pg (ref 27.2–33.4)
MCHC: 33 g/dL (ref 32.0–36.0)
MCV: 88.7 fL (ref 79.3–98.0)
Monocytes Absolute: 0.4 10*3/uL (ref 0.1–0.9)
Monocytes Relative: 7 %
Neutro Abs: 2.9 10*3/uL (ref 1.5–6.5)
Neutrophils Relative %: 47 %
Platelets: 265 10*3/uL (ref 140–400)
RBC: 4.32 MIL/uL (ref 4.20–5.82)
RDW: 14.7 % — ABNORMAL HIGH (ref 11.0–14.6)
WBC: 6 10*3/uL (ref 4.0–10.3)

## 2017-10-10 MED ORDER — LEUPROLIDE ACETATE (4 MONTH) 30 MG IM KIT
30.0000 mg | PACK | Freq: Once | INTRAMUSCULAR | Status: AC
  Administered 2017-10-10: 30 mg via INTRAMUSCULAR
  Filled 2017-10-10: qty 30

## 2017-10-10 NOTE — Progress Notes (Signed)
Hematology and Oncology Follow Up Visit  Dennis Fischer [id number] 15-Aug-1946 72 y.o. 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  He was initially diagnosed in 2016. He had a Gleason score of 7 and a PSA of 12.8.  He has documented bony metastasis in May 2018.   Prior Therapy: He received definitive therapy using radiation therapy and androgen deprivation in 2016.  He developed advanced disease with pelvic adenopathy and was treated with androgen deprivation while was living in [place].  He subsequently developed castration resistant disease.  Current therapy:   Lupron 30 mg every 4 months.  He is due on October 10, 2017.  Zytiga 1000 mg daily with prednisone at 5 mg daily restarted on 03/14/2017.   Interim History:  [name] is here for a follow-up.  He continues to take Zytiga without any major complications since the last visit.  He does report some mild fatigue and an episode of dizziness but no falls or syncope.  He denies any lower extremity edema, weight loss or appetite changes.  He denies any bone pain or pathological fractures.  He continues to perform activities of daily living without any decline in ability to do so.  His performance status and activity level remained at baseline.  He denied any complications related to Lupron.  He denies any hot flashes excessive weight gain or concentration issues.  He does not report any headaches, blurry vision, syncope or seizures. He does not report any fevers, chills or sweats. He does not report any cough, wheezing or hemoptysis. He does not report any chest pain, palpitation, orthopnea or leg edema. He does not report any frequency urgency or hesitancy. He does not report any musca skeletal complaints.  He does not report any heat or cold intolerance.  He does not report any skin rashes or lesions.  He does not  report any lymphadenopathy or petechiae.  He does not report skin rashes or lesions or irritation.  He denies any anxiety or depression.  Remaining review of systems is negative.   Medications: I have reviewed the patient's current medications.  Current Outpatient Medications  Medication Sig Dispense Refill  . abiraterone acetate (ZYTIGA) 250 MG tablet Take 4 tablets (1,000 mg total) by mouth daily. Take on an empty stomach 1 hour before or 2 hours after a meal 120 tablet 1  . Acetaminophen (TYLENOL PO) Take 500 mg by mouth daily as needed.    Marland Kitchen amLODipine (NORVASC) 10 MG tablet Take 10 mg by mouth daily.    Marland Kitchen aspirin 81 MG tablet Take 81 mg by mouth daily.    . carvedilol (COREG) 12.5 MG tablet Take 12.5 mg by mouth 2 (two) times daily.  3  . ergocalciferol (VITAMIN D2) 50000 units capsule Take 50,000 Units by mouth once a week.    Marland Kitchen levofloxacin (LEVAQUIN) 750 MG tablet Take 1 tablet (750 mg total) by mouth daily. X 7 days 7 tablet 0  . Multiple Vitamin (MULTIVITAMIN) tablet Take 1 tablet by mouth daily.    Marland Kitchen omeprazole (PRILOSEC) 20 MG capsule Take 20 mg by mouth daily.    . potassium chloride SA (K-DUR,KLOR-CON) 20 MEQ tablet TAKE 1 TABLET(20 MEQ) BY MOUTH DAILY 90 tablet 0  . sildenafil (VIAGRA) 100 MG tablet Take 100 mg by mouth daily as needed for erectile dysfunction.    . simvastatin (ZOCOR) 20 MG tablet Take 20 mg  by mouth daily.     No current facility-administered medications for this visit.      Allergies:  Allergies  Allergen Reactions  . Lisinopril Swelling    Facial swelling    Past Medical History, Surgical history, Social history, and Family History reviewed and unchanged at this time.    Physical Exam: Blood pressure (!) 139/109, pulse 77, temperature 97.6 F (36.4 C), temperature source Oral, resp. rate 18, height 5\' 9"  (1.753 m), weight 222 lb 3.2 oz (100.8 kg), SpO2 100 %. ECOG: 0 General appearance: Alert, awake gentleman appeared without distress  today. Head: Normocephalic, without obvious abnormality  Oropharynx: Without thrush or ulcers.  Icterus is moist and pink. Eyes: No scleral icterus.  Conjunctiva is clear. Lymph nodes: Cervical, supraclavicular, and axillary nodes normal. Heart: Regular rate and rhythm without murmurs. Lung:chest clear to auscultation without rhonchi or wheezes. Abdomin: Soft with good bowel sounds in all 4 quadrants.  No rebound or guarding.  No shifting dullness or ascites. Musculoskeletal: No joint deformity or effusion. Skin: No rashes or lesions. Neurological: Ambulating without difficulties.  No motor or sensory deficits noted.   Lab Results: Lab Results  Component Value Date   WBC 6.0 10/10/2017   HGB 12.6 (L) 10/10/2017   HCT 38.3 (L) 10/10/2017   MCV 88.7 10/10/2017   PLT 265 10/10/2017     Chemistry      Component Value Date/Time   NA 135 09/04/2017 2137   NA 141 08/08/2017 1116   K 3.2 (L) 09/04/2017 2137   K 3.6 08/08/2017 1116   CL 105 09/04/2017 2137   CO2 22 09/04/2017 2137   CO2 24 08/08/2017 1116   BUN 15 09/04/2017 2137   BUN 13.2 08/08/2017 1116   CREATININE 1.00 09/04/2017 2137   CREATININE 1.1 08/08/2017 1116      Component Value Date/Time   CALCIUM 8.7 (L) 09/04/2017 2137   CALCIUM 9.1 08/08/2017 1116   ALKPHOS 168 (H) 09/04/2017 2137   ALKPHOS 214 (H) 08/08/2017 1116   AST 23 09/04/2017 2137   AST 17 08/08/2017 1116   ALT 19 09/04/2017 2137   ALT 17 08/08/2017 1116   BILITOT 0.6 09/04/2017 2137   BILITOT 0.29 08/08/2017 1116       Results for HAZIM, TREADWAY (MRN [id number]) as of 10/10/2017 09:51  Ref. Range 06/06/2017 12:46 08/08/2017 11:16  Prostate Specific Ag, Serum Latest Ref Range: 0.0 - 4.0 ng/mL 30.1 (H) 35.1 (H)    Impression and Plan:  1. 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   His bone scan obtained on 01/18/2017 was reviewed again and showed metastatic disease to the bone with a rapid rise in his PSA up  to 61.7.  He is currently on Zytiga 1000 mg daily since July 2018.  He denied any recent complications since last visit and without any missed doses.  His PSA showed excellent response overall with 50% reduction in his PSA since the start of therapy.  His PSA is slowly rising currently at 41 and November 2018 compared to 30 in September 2018.  His PSA from today is currently pending.  The natural course of castration resistant prostate cancer was discussed today again and reviewed extensively.  Treatment options were reviewed which include continuing Zytiga given his excellent response versus alternative therapy.  Risks and benefits of continuing Zytiga was discussed today is agreeable to continue.  Alternative therapies were reviewed today which include Xtandi versus systemic chemotherapy.    2. Androgen depravation:  Lupron 30 mg every 4 months will be continued indefinitely.  He will receive it on October 10, 2017.  Complication associated with this medication was reviewed again and he is able to tolerate it.  3.  Hypokalemia: Related to Zytiga.  He is currently on potassium supplements and will be checked periodically.  4. Hypertension: His blood pressure is slightly elevated on the diastolic level.  Ambulatory blood pressure monitoring has been within normal range.  I continue to counsel him about complications related to Zytiga including hypertension and the importance of continuing monitoring blood pressure periodically.  Adjustment of his antihypertensive medication may be needed if hypertension is detected.  5. Follow-up: In 2 months for clinical visit.  15  minutes was spent with the patient face-to-face today.  More than 50% of time was dedicated to patient counseling, education and coordination of the patient's multifaceted care.     Zola Button, MD 1/31/201910:00 AM

## 2017-10-10 NOTE — Telephone Encounter (Signed)
Patient scheduled AVS/Calendar was given

## 2017-10-10 NOTE — Patient Instructions (Signed)
Leuprolide depot injection What is this medicine? LEUPROLIDE (loo PROE lide) is a man-made protein that acts like a natural hormone in the body. It decreases testosterone in men and decreases estrogen in women. In men, this medicine is used to treat advanced prostate cancer. In women, some forms of this medicine may be used to treat endometriosis, uterine fibroids, or other male hormone-related problems. This medicine may be used for other purposes; ask your health care provider or pharmacist if you have questions. COMMON BRAND NAME(S): Eligard, Lupron Depot, Lupron Depot-Ped, Viadur What should I tell my health care provider before I take this medicine? They need to know if you have any of these conditions: -diabetes -heart disease or previous heart attack -high blood pressure -high cholesterol -mental illness -osteoporosis -pain or difficulty passing urine -seizures -spinal cord metastasis -stroke -suicidal thoughts, plans, or attempt; a previous suicide attempt by you or a family member -tobacco smoker -unusual vaginal bleeding (women) -an unusual or allergic reaction to leuprolide, benzyl alcohol, other medicines, foods, dyes, or preservatives -pregnant or trying to get pregnant -breast-feeding How should I use this medicine? This medicine is for injection into a muscle or for injection under the skin. It is given by a health care professional in a hospital or clinic setting. The specific product will determine how it will be given to you. Make sure you understand which product you receive and how often you will receive it. Talk to your pediatrician regarding the use of this medicine in children. Special care may be needed. Overdosage: If you think you have taken too much of this medicine contact a poison control center or emergency room at once. NOTE: This medicine is only for you. Do not share this medicine with others. What if I miss a dose? It is important not to miss a dose.  Call your doctor or health care professional if you are unable to keep an appointment. Depot injections: Depot injections are given either once-monthly, every 12 weeks, every 16 weeks, or every 24 weeks depending on the product you are prescribed. The product you are prescribed will be based on if you are male or male, and your condition. Make sure you understand your product and dosing. What may interact with this medicine? Do not take this medicine with any of the following medications: -chasteberry This medicine may also interact with the following medications: -herbal or dietary supplements, like black cohosh or DHEA -male hormones, like estrogens or progestins and birth control pills, patches, rings, or injections -male hormones, like testosterone This list may not describe all possible interactions. Give your health care provider a list of all the medicines, herbs, non-prescription drugs, or dietary supplements you use. Also tell them if you smoke, drink alcohol, or use illegal drugs. Some items may interact with your medicine. What should I watch for while using this medicine? Visit your doctor or health care professional for regular checks on your progress. During the first weeks of treatment, your symptoms may get worse, but then will improve as you continue your treatment. You may get hot flashes, increased bone pain, increased difficulty passing urine, or an aggravation of nerve symptoms. Discuss these effects with your doctor or health care professional, some of them may improve with continued use of this medicine. Male patients may experience a menstrual cycle or spotting during the first months of therapy with this medicine. If this continues, contact your doctor or health care professional. What side effects may I notice from receiving this medicine? Side   effects that you should report to your doctor or health care professional as soon as possible: -allergic reactions like skin  rash, itching or hives, swelling of the face, lips, or tongue -breathing problems -chest pain -depression or memory disorders -pain in your legs or groin -pain at site where injected or implanted -seizures -severe headache -swelling of the feet and legs -suicidal thoughts or other mood changes -visual changes -vomiting Side effects that usually do not require medical attention (report to your doctor or health care professional if they continue or are bothersome): -breast swelling or tenderness -decrease in sex drive or performance -diarrhea -hot flashes -loss of appetite -muscle, joint, or bone pains -nausea -redness or irritation at site where injected or implanted -skin problems or acne This list may not describe all possible side effects. Call your doctor for medical advice about side effects. You may report side effects to FDA at 1-800-FDA-1088. Where should I keep my medicine? This drug is given in a hospital or clinic and will not be stored at home. NOTE: This sheet is a summary. It may not cover all possible information. If you have questions about this medicine, talk to your doctor, pharmacist, or health care provider.  2018 Elsevier/Gold Standard (2016-02-09 09:45:53)  

## 2017-10-11 ENCOUNTER — Telehealth: Payer: Self-pay | Admitting: Pharmacy Technician

## 2017-10-11 LAB — PROSTATE-SPECIFIC AG, SERUM (LABCORP): Prostate Specific Ag, Serum: 41.7 ng/mL — ABNORMAL HIGH (ref 0.0–4.0)

## 2017-10-11 NOTE — Telephone Encounter (Signed)
Oral Oncology Patient Advocate Encounter  Was successful in securing patient a new $ 7500 grant from [facility]) to provide continued copayment coverage for his Zytiga.  This will keep the out of pocket expense at $0.    I have spoken with the patient.    The billing information is as follows and has been shared with [place].   Member ID: [id number] Dates of Eligibility: 10/10/2016 through 03/11/2018  Dennis Fischer. [name], [name] [name] [phone] 10/11/2017 4:16 PM

## 2017-11-22 ENCOUNTER — Other Ambulatory Visit: Payer: Self-pay | Admitting: Oncology

## 2017-11-22 DIAGNOSIS — C61 Malignant neoplasm of prostate: Secondary | ICD-10-CM

## 2017-12-02 ENCOUNTER — Other Ambulatory Visit: Payer: Self-pay | Admitting: Oncology

## 2017-12-05 ENCOUNTER — Inpatient Hospital Stay (HOSPITAL_BASED_OUTPATIENT_CLINIC_OR_DEPARTMENT_OTHER): Payer: Medicare Other | Admitting: Oncology

## 2017-12-05 ENCOUNTER — Telehealth: Payer: Self-pay | Admitting: Oncology

## 2017-12-05 ENCOUNTER — Inpatient Hospital Stay: Payer: Medicare Other | Attending: Oncology

## 2017-12-05 VITALS — BP 147/88 | HR 71 | Temp 98.5°F | Resp 18 | Ht 69.0 in | Wt 219.7 lb

## 2017-12-05 DIAGNOSIS — Z79899 Other long term (current) drug therapy: Secondary | ICD-10-CM | POA: Insufficient documentation

## 2017-12-05 DIAGNOSIS — E876 Hypokalemia: Secondary | ICD-10-CM | POA: Insufficient documentation

## 2017-12-05 DIAGNOSIS — Z7982 Long term (current) use of aspirin: Secondary | ICD-10-CM | POA: Insufficient documentation

## 2017-12-05 DIAGNOSIS — I10 Essential (primary) hypertension: Secondary | ICD-10-CM | POA: Insufficient documentation

## 2017-12-05 DIAGNOSIS — C61 Malignant neoplasm of prostate: Secondary | ICD-10-CM | POA: Diagnosis not present

## 2017-12-05 LAB — CMP (CANCER CENTER ONLY)
ALT: 17 U/L (ref 0–55)
AST: 16 U/L (ref 5–34)
Albumin: 3.7 g/dL (ref 3.5–5.0)
Alkaline Phosphatase: 205 U/L — ABNORMAL HIGH (ref 40–150)
Anion gap: 8 (ref 3–11)
BUN: 12 mg/dL (ref 7–26)
CO2: 27 mmol/L (ref 22–29)
Calcium: 9.5 mg/dL (ref 8.4–10.4)
Chloride: 107 mmol/L (ref 98–109)
Creatinine: 1.07 mg/dL (ref 0.70–1.30)
GFR, Est AFR Am: >60 mL/min (ref >60)
GFR, Estimated: >60 mL/min (ref >60)
Glucose, Bld: 110 mg/dL (ref 70–140)
Potassium: 3.3 mmol/L — ABNORMAL LOW (ref 3.5–5.1)
Sodium: 142 mmol/L (ref 136–145)
Total Bilirubin: 0.6 mg/dL (ref 0.2–1.2)
Total Protein: 7.2 g/dL (ref 6.4–8.3)

## 2017-12-05 LAB — CBC WITH DIFFERENTIAL (CANCER CENTER ONLY)
Basophils Absolute: 0 10*3/uL (ref 0.0–0.1)
Basophils Relative: 1 %
Eosinophils Absolute: 0.4 10*3/uL (ref 0.0–0.5)
Eosinophils Relative: 8 %
HCT: 37.3 % — ABNORMAL LOW (ref 38.4–49.9)
Hemoglobin: 12.7 g/dL — ABNORMAL LOW (ref 13.0–17.1)
Lymphocytes Relative: 32 %
Lymphs Abs: 1.8 10*3/uL (ref 0.9–3.3)
MCH: 30.3 pg (ref 27.2–33.4)
MCHC: 34 g/dL (ref 32.0–36.0)
MCV: 89.1 fL (ref 79.3–98.0)
Monocytes Absolute: 0.4 10*3/uL (ref 0.1–0.9)
Monocytes Relative: 7 %
Neutro Abs: 2.9 10*3/uL (ref 1.5–6.5)
Neutrophils Relative %: 52 %
Platelet Count: 269 10*3/uL (ref 140–400)
RBC: 4.19 MIL/uL — ABNORMAL LOW (ref 4.20–5.82)
RDW: 14.4 % (ref 11.0–14.6)
WBC Count: 5.5 10*3/uL (ref 4.0–10.3)

## 2017-12-05 NOTE — Telephone Encounter (Signed)
Scheduled appt per 3/28 los - Gave patient AVS and calender per los.  

## 2017-12-05 NOTE — Progress Notes (Signed)
Hematology and Oncology Follow Up Visit  Dennis Fischer [id number] 24-May-1946 72 y.o. 12/05/2017 9:14 AM Elwyn Reach, MDGarba, Henderson Newcomer, MD   Principle Diagnosis: 2 year old man with advanced prostate cancer with diagnosed in 2016. He had a Gleason score of 7 and a PSA of 12.8.  He developed castration resistant disease to the bone.  Prior Therapy: He received definitive therapy using radiation therapy and androgen deprivation in 2016.  He developed advanced disease with pelvic adenopathy and was treated with androgen deprivation while was living in [place].  He subsequently developed castration resistant disease.  Current therapy:   Lupron 30 mg every 4 months.    Zytiga 1000 mg daily with prednisone at 5 mg daily restarted on 03/14/2017.    Interim History:  Mr. Held presents today for a follow-up visit.  He reports no major changes in his health or recent complaints.  He continues to be on Zytiga with very little to no complications.  He does report grade 1 arthralgias but really not interfering with his function.  He continues to ambulate regularly with very little knee pain.  Most of his pain is arthritic in nature.  He denies any nausea, excessive fatigue or edema.  He denied any back pain or shoulder pain.  His appetite remain excellent and his performance status is unchanged.  He does not report any headaches, blurry vision, syncope or seizures. He does not report any fevers, chills or sweats.  Appetite remain excellent and weight is stable.  He does not report any cough, wheezing or hemoptysis. He does not report any chest pain, palpitation, orthopnea. He does not report any frequency urgency or hesitancy. He does not report any heat or cold intolerance.  He does not report any skin rashes or lesions.  He does not report any lymphadenopathy or ecchymosis.Marland Kitchen  He does not report ecchymosis or petechiae.  He denies any anxiety or depression.  Remaining review of systems is  negative.   Medications: I have reviewed the patient's current medications.  Current Outpatient Medications  Medication Sig Dispense Refill  . abiraterone acetate (ZYTIGA) 250 MG tablet TAKE 4 TABLETS BY MOUTH ONCE DAILY. TAKE ON AN EMPTY STOMACH (AT LEAST1 HR BEFORE OR 2 HR AFTER EATING) SWALLOW WHOLE. 120 tablet 0  . Acetaminophen (TYLENOL PO) Take 500 mg by mouth daily as needed.    Marland Kitchen amLODipine (NORVASC) 10 MG tablet Take 10 mg by mouth daily.    Marland Kitchen aspirin 81 MG tablet Take 81 mg by mouth daily.    . carvedilol (COREG) 12.5 MG tablet Take 12.5 mg by mouth 2 (two) times daily.  3  . ergocalciferol (VITAMIN D2) 50000 units capsule Take 50,000 Units by mouth once a week.    Marland Kitchen levofloxacin (LEVAQUIN) 750 MG tablet Take 1 tablet (750 mg total) by mouth daily. X 7 days 7 tablet 0  . Multiple Vitamin (MULTIVITAMIN) tablet Take 1 tablet by mouth daily.    Marland Kitchen omeprazole (PRILOSEC) 20 MG capsule Take 20 mg by mouth daily.    . potassium chloride SA (K-DUR,KLOR-CON) 20 MEQ tablet TAKE 1 TABLET(20 MEQ) BY MOUTH DAILY 90 tablet 0  . sildenafil (VIAGRA) 100 MG tablet Take 100 mg by mouth daily as needed for erectile dysfunction.    . simvastatin (ZOCOR) 20 MG tablet Take 20 mg by mouth daily.     No current facility-administered medications for this visit.      Allergies:  Allergies  Allergen Reactions  . Lisinopril Swelling  Facial swelling    Past Medical History, Surgical history, Social history, and Family History reviewed and unchanged at this time.    Physical Exam: Blood pressure (!) 147/88, pulse 71, temperature 98.5 F (36.9 C), temperature source Oral, resp. rate 18, height 5\' 9"  (1.753 m), weight 219 lb 11.2 oz (99.7 kg), SpO2 99 %.   ECOG: 0 General appearance: Well-appearing gentleman appeared comfortable. Head: Atraumatic without abnormalities. Oropharynx: No oral ulcers or lesions. Eyes: Sclera anicteric. Lymph nodes: No lymphadenopathy noted in the cervical,  supraclavicular or axillary regions. Heart: Regular rate and rhythm without any murmurs rubs or gallops. Lung: Clear to auscultation without any rhonchi, wheezes or dullness to percussion. Abdomin: Soft, nontender without any rebound or guarding. Musculoskeletal: No clubbing or cyanosis.  Full range of motion noted in all extremities. Skin: No ecchymosis or petechiae. Neurological: No motor or sensory deficits.   Lab Results: Lab Results  Component Value Date   WBC 6.0 10/10/2017   HGB 12.6 (L) 10/10/2017   HCT 38.3 (L) 10/10/2017   MCV 88.7 10/10/2017   PLT 265 10/10/2017     Chemistry      Component Value Date/Time   NA 141 10/10/2017 0917   NA 141 08/08/2017 1116   K 3.4 (L) 10/10/2017 0917   K 3.6 08/08/2017 1116   CL 107 10/10/2017 0917   CO2 25 10/10/2017 0917   CO2 24 08/08/2017 1116   BUN 11 10/10/2017 0917   BUN 13.2 08/08/2017 1116   CREATININE 1.04 10/10/2017 0917   CREATININE 1.1 08/08/2017 1116      Component Value Date/Time   CALCIUM 9.1 10/10/2017 0917   CALCIUM 9.1 08/08/2017 1116   ALKPHOS 216 (H) 10/10/2017 0917   ALKPHOS 214 (H) 08/08/2017 1116   AST 16 10/10/2017 0917   AST 17 08/08/2017 1116   ALT 17 10/10/2017 0917   ALT 17 08/08/2017 1116   BILITOT 0.5 10/10/2017 0917   BILITOT 0.29 08/08/2017 1116       Results for MARCQUES, WRIGHTSMAN (MRN [id number]) as of 12/05/2017 09:15  Ref. Range 01/11/2017 08:01 03/26/2017 14:18 06/06/2017 12:46 08/08/2017 11:16 10/10/2017 09:19  Prostate Specific Ag, Serum Latest Ref Range: 0.0 - 4.0 ng/mL 61.7 (H) 45.2 (H) 30.1 (H) 35.1 (H) 41.7 (H)     Impression and Plan:  1. Castration-resistant metastatic prostate cancer diagnosed in 2016.  He has disease to the bone and he is a very limited.  His PSA was as high as 61.7 in May 2018.  He is currently on Zytiga and has tolerated very well.  He had an excellent response to the PSA dropped by 50% in September 2018.  In the last few months, his PSA has been slow rise.   He remains asymptomatic at this time without any complications related to therapy.  The risks and benefits of continuing this medication was discussed today.  Alternative therapies that include systemic chemotherapy as well as Trudi Ida would be a possibility he develops progression of disease.  The rationale for using these medications were reviewed today in detail.  The time being we will continue to monitor his PSA and restage him with a CT scan and a bone scan in the near future.   2. Androgen depravation: He will continue to receive Lupron at 30 mg every 4 months and his next dose scheduled for the first week of June.  Long-term complication associated with this medication was reviewed today including hot flashes, weight gain sexual dysfunction were discussed.  Is agreeable  to continue.  3.  Hypokalemia: Related to Zytiga.  He is currently on potassium supplements and his potassium will be repeated today.  4. Hypertension: His blood pressure is within normal range and we will continue to monitor on Zytiga.  5.  Goals of care: He is an incurable malignancy but a disease that can be palliated for an extended period of time.  His performance status is adequate and aggressive therapy is warranted.  6. Follow-up: In 2 months for clinical visit.  25  minutes was spent with the patient face-to-face today.  More than 50% of time was dedicated to patient counseling, education and discussing diagnosis, prognosis and future alternative therapies.    Zola Button, MD 3/28/20199:14 AM

## 2017-12-06 LAB — PROSTATE-SPECIFIC AG, SERUM (LABCORP): Prostate Specific Ag, Serum: 50.2 ng/mL — ABNORMAL HIGH (ref 0.0–4.0)

## 2018-01-17 ENCOUNTER — Encounter: Payer: Self-pay | Admitting: Student in an Organized Health Care Education/Training Program

## 2018-01-17 ENCOUNTER — Other Ambulatory Visit: Payer: Self-pay

## 2018-01-17 ENCOUNTER — Ambulatory Visit (INDEPENDENT_AMBULATORY_CARE_PROVIDER_SITE_OTHER): Payer: Medicare Other | Admitting: Student in an Organized Health Care Education/Training Program

## 2018-01-17 ENCOUNTER — Other Ambulatory Visit: Payer: Self-pay | Admitting: Student in an Organized Health Care Education/Training Program

## 2018-01-17 ENCOUNTER — Ambulatory Visit
Admission: RE | Admit: 2018-01-17 | Discharge: 2018-01-17 | Disposition: A | Discharge status: Home / Self Care | Payer: Medicare Other | Source: Ambulatory Visit | Attending: Family Medicine | Admitting: Family Medicine

## 2018-01-17 DIAGNOSIS — M1711 Unilateral primary osteoarthritis, right knee: Secondary | ICD-10-CM | POA: Diagnosis not present

## 2018-01-17 DIAGNOSIS — G8929 Other chronic pain: Secondary | ICD-10-CM | POA: Diagnosis not present

## 2018-01-17 DIAGNOSIS — M25561 Pain in right knee: Secondary | ICD-10-CM

## 2018-01-17 DIAGNOSIS — Z7689 Persons encountering health services in other specified circumstances: Secondary | ICD-10-CM | POA: Diagnosis not present

## 2018-01-17 DIAGNOSIS — Z471 Aftercare following joint replacement surgery: Secondary | ICD-10-CM | POA: Diagnosis not present

## 2018-01-17 DIAGNOSIS — Z96643 Presence of artificial hip joint, bilateral: Secondary | ICD-10-CM | POA: Diagnosis not present

## 2018-01-17 IMAGING — CR DG KNEE COMPLETE 4+V*R*
4 series · 4 of 4 positions shown · non-contrast
Comparison: None.

CLINICAL DATA: Chronic right knee pain common no known injury,
initial encounter

EXAM:
RIGHT KNEE - COMPLETE 4+ VIEW

[w knee ap right]
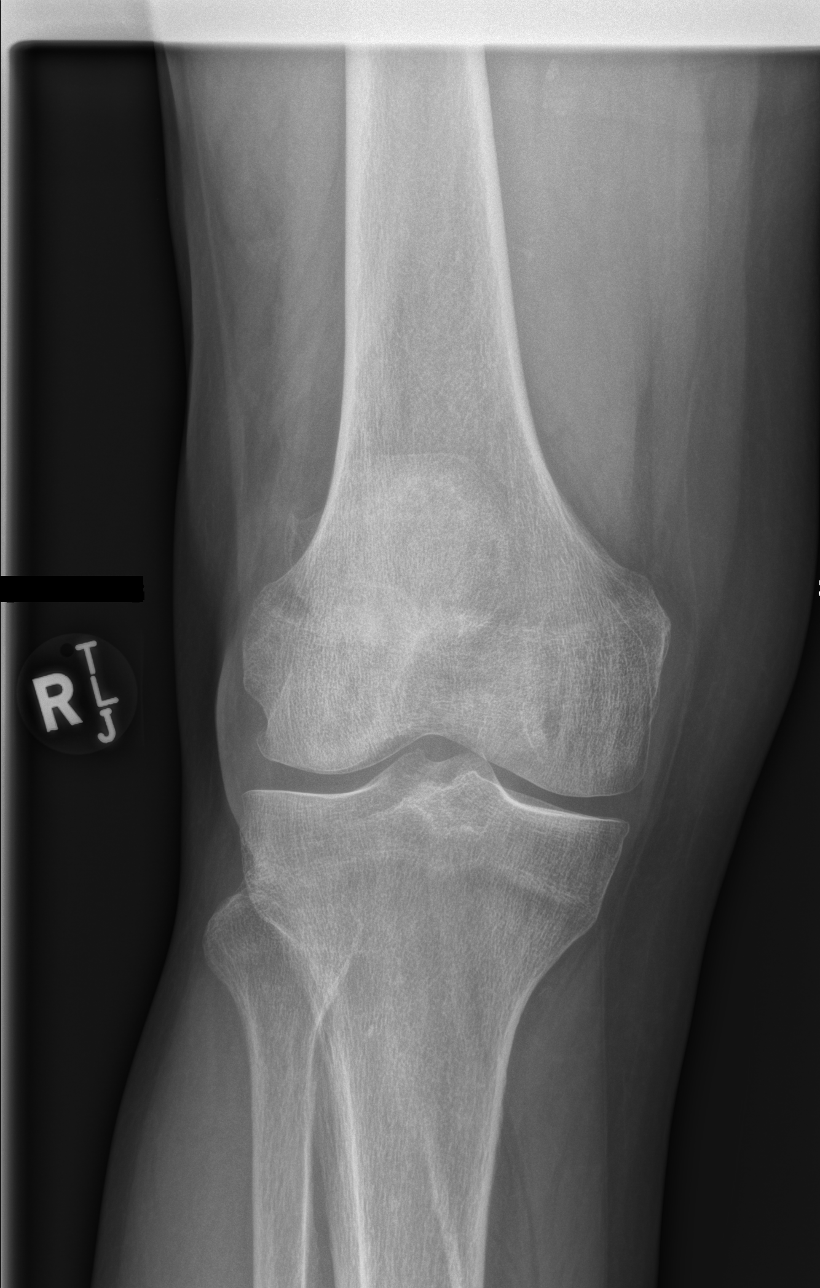

[w knee lat right]
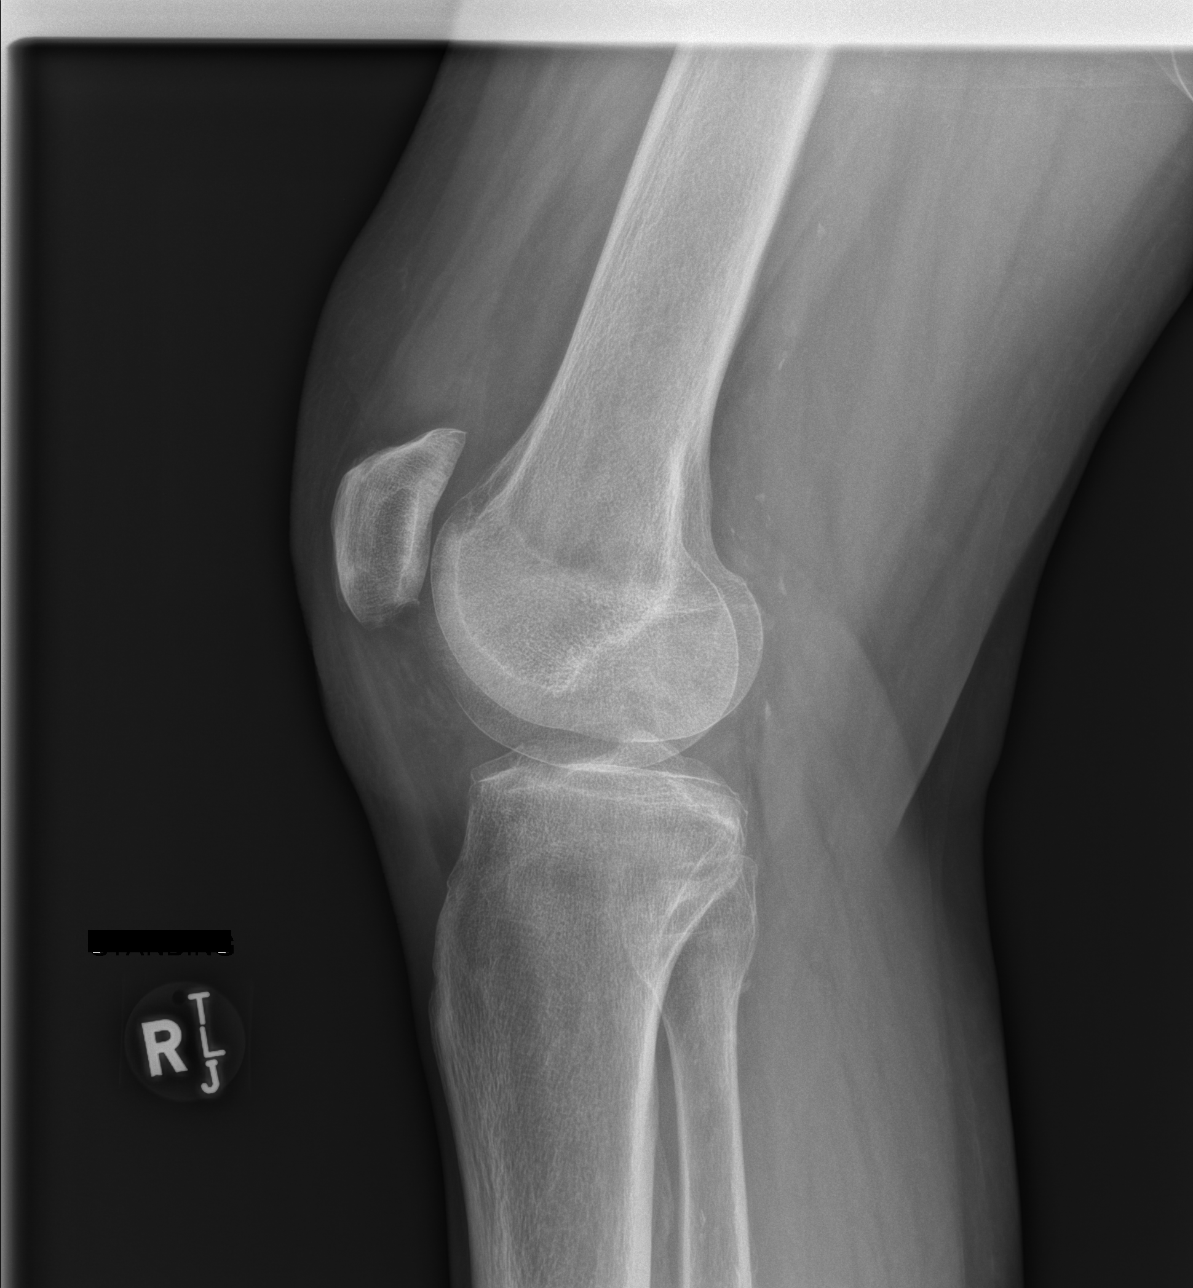

[x knee tunnel right]
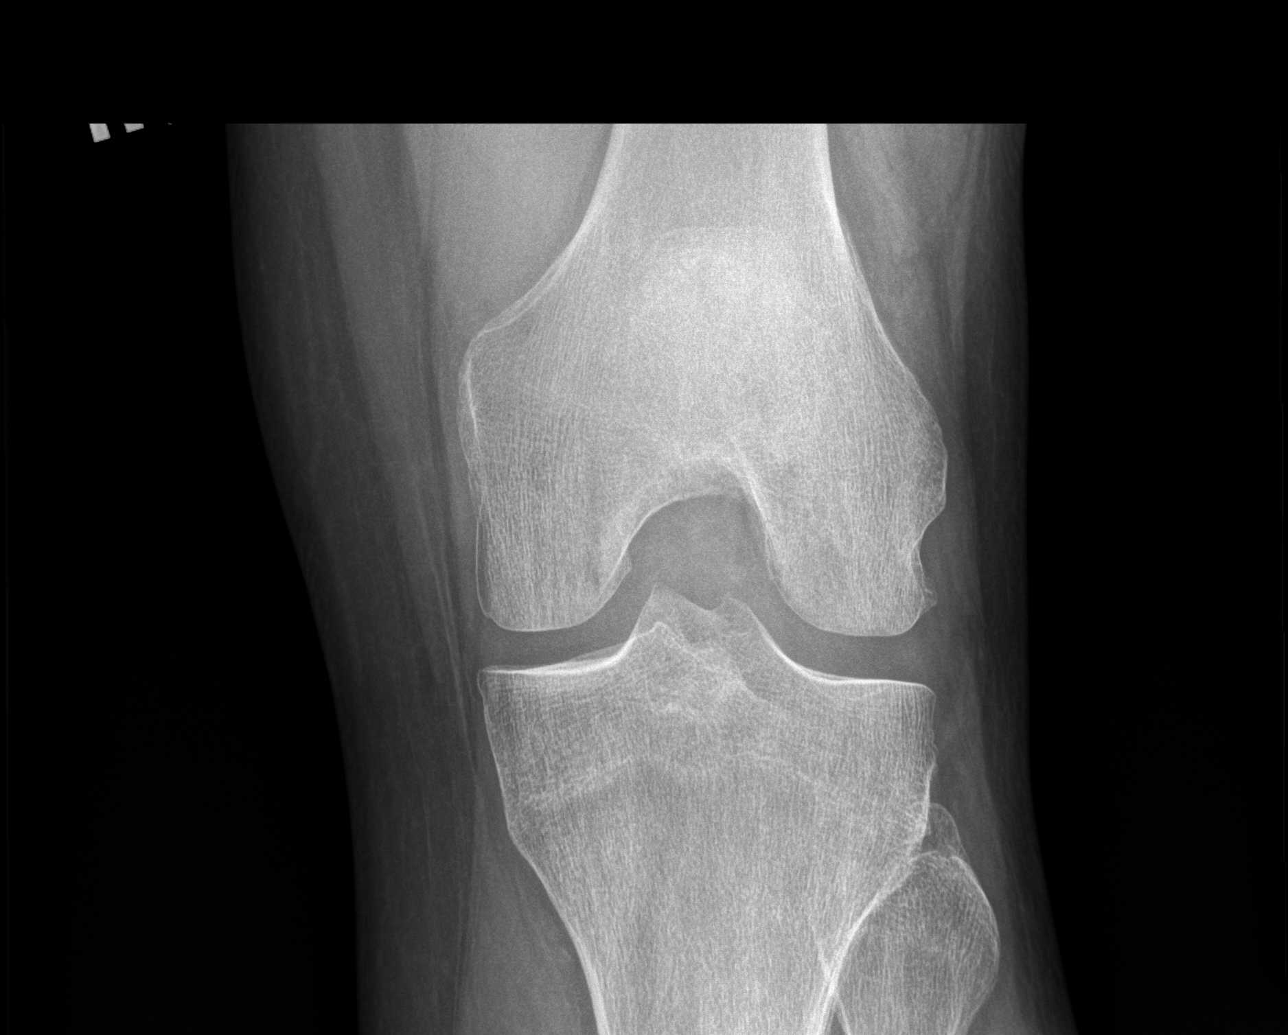

[x knee sunrise right]
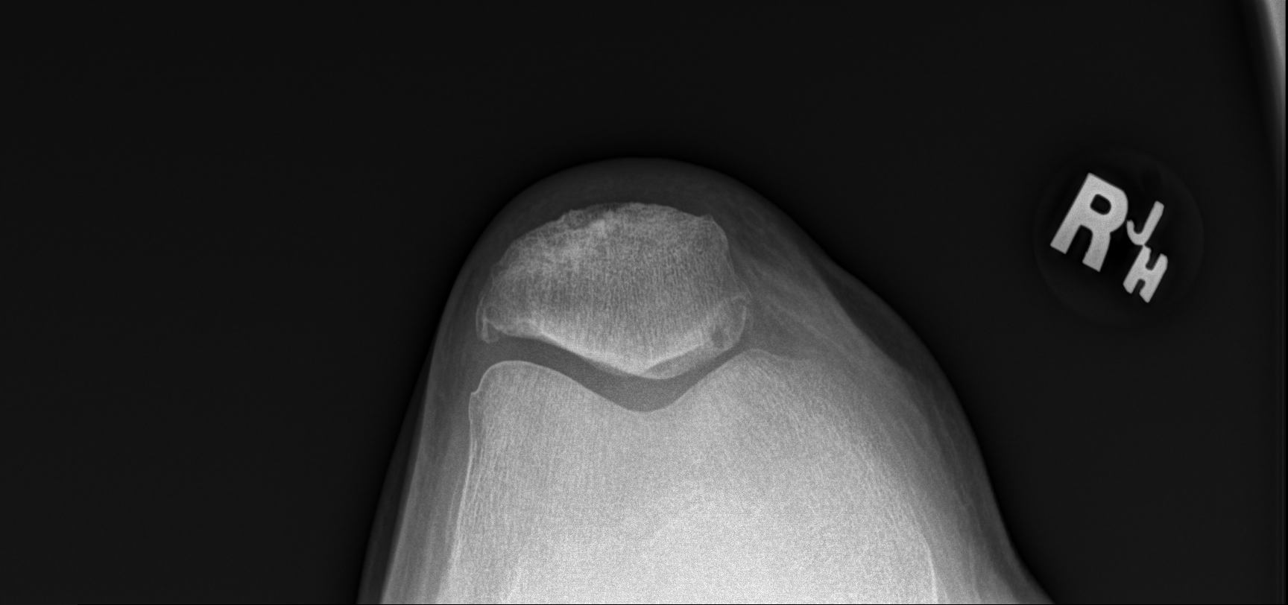

[4 of 4 positions shown; findings below may reference images not displayed]

FINDINGS: Mild medial joint space narrowing is noted. No acute fracture or
dislocation is seen. Mild patellofemoral degenerative changes are
noted as well. No soft tissue changes are seen.
IMPRESSION: Mild degenerative change without acute abnormality.

## 2018-01-17 NOTE — Patient Instructions (Signed)
It was a pleasure seeing you today in our clinic. Here is the treatment plan we have discussed and agreed upon together:  We ordered Xrays at today's visit. I will call or send you a letter with these results. If you do not hear from me within the next week, please give our office a call.  Sign up for My Chart to have easy access to your labs results, and communication with your primary care physician.  Our clinic's number is [phone]. Please call with questions or concerns about what we discussed today.  Be well, Dr. Burr Medico

## 2018-01-17 NOTE — Assessment & Plan Note (Addendum)
Right knee and thigh pain. Likely has osteoarthritis in right knee, but pain may also be coming from hip. - DG HIP UNILAT WITH PELVIS MIN 4 VIEWS RIGHT; Future - DG Knee 4 Views W/Patella Right; Future - will follow up results with patient

## 2018-01-17 NOTE — Progress Notes (Signed)
   CC: establish care, right knee pain  HPI: Dennis Fischer is a 72 y.o. male who presents to [facility] today to establish care as a new patient.   Right knee/thigh pain: Patient reports a history of bilateral hip replacement. Recently he has noticed occasional right knee pain without swelling, worst when he lifts his leg off the gas pedal while driving. He additionally feels he has some right thigh pain with this activity. - no joint swelling - denies fevers - denies redness or warmth over knee joint - denies falls, denies feeling that knee catches or locks  PMH: 1. Allergies 2. Arthritis 3. HTN 4. HLD 5. Prostate Cancer 2014, s/p radiation, follows with Dr. Alen Blew.  PSH: Past Surgical History:  Procedure Laterality Date  . HIP SURGERY Bilateral   2013, 2014  Family Hx: 1. HTN (parents)  Social Hx: Lives at home with his wife. No longer smokes, quit in 2014. States he was an "occasional" smoker when he was a smoker.  One drink per day, tequila. Uses marijuana occasional. No other drug use.  Allergies:  1. Lisinopril - angioedema 2. Nuts  Meds: 1. Norvasc 2. ASA 3. Coreg 4. Vitamin D 5. Prilosec 5. KDUR 7. Sildenafil (viagra) 8. Simvastatin 9. Zytiga  Review of Symptoms:  See HPI for ROS.   CC, SH/smoking status, and VS noted.  Objective: BP 140/78   Pulse (!) 59   Temp 98.6 F (37 C) (Oral)   Ht 5\' 9"  (1.753 m)   Wt 220 lb (99.8 kg)   SpO2 97%   BMI 32.49 kg/m  GEN: NAD, alert, cooperative, and pleasant. EYE: no conjunctival injection, pupils equally round and reactive to light ENMT: normal tympanic light reflex, no nasal polyps,no rhinorrhea, no pharyngeal erythema or exudates NECK: full ROM, no thyromegaly RESPIRATORY: clear to auscultation bilaterally with no wheezes, rhonchi or rales, good effort CV: RRR, no m/r/g, no peripheral edema GI: soft, non-tender, non-distended, no hepatosplenomegaly SKIN: warm and dry, no rashes or lesions NEURO: II-XII  grossly intact, normal gait, peripheral sensation intact PSYCH: AAOx3, appropriate affect Knee: Normal to inspection with no erythema or effusion or obvious bony abnormalities. Palpation normal with no warmth, joint line tenderness, patellar tenderness, or condyle tenderness. ROM full in flexion and extension and lower leg rotation. Ligaments with solid consistent endpoints including ACL, PCL, LCL, MCL. Negative Mcmurray's, Apley's, and Thessalonian tests. Non painful patellar compression. Patellar glide with +crepitus. Patellar and quadriceps tendons unremarkable. Hamstring and quadriceps strength is normal.   Assessment and plan:  Right knee pain Right knee and thigh pain. Likely has osteoarthritis in right knee, but pain may also be coming from hip. - DG HIP UNILAT WITH PELVIS MIN 4 VIEWS RIGHT; Future - DG Knee 4 Views W/Patella Right; Future - will follow up results with patient   Encounter to establish care Problems, history, medications reviewed and updated.   [name], MD,MS,  PGY2 01/20/2018 3:25 PM

## 2018-01-20 ENCOUNTER — Other Ambulatory Visit: Payer: Self-pay | Admitting: Oncology

## 2018-01-20 DIAGNOSIS — C61 Malignant neoplasm of prostate: Secondary | ICD-10-CM

## 2018-01-20 DIAGNOSIS — Z7189 Other specified counseling: Secondary | ICD-10-CM | POA: Insufficient documentation

## 2018-01-20 NOTE — Assessment & Plan Note (Signed)
Problems, history, medications reviewed and updated.

## 2018-01-23 ENCOUNTER — Telehealth: Payer: Self-pay | Admitting: Student in an Organized Health Care Education/Training Program

## 2018-01-23 NOTE — Telephone Encounter (Signed)
Called and spoke with patient regarding Knee and hip XR results. There was mild arthritis noted in the knee which may cause his thigh pain. One option would be to try a corticosteroid knee injection to see if this helps alleviate his symptoms, this would be both potentially diagnostic and therapeutic. Patient is open to this option and will make an appointment through our front office.  Dennis Coombe, MD PGY-2 [facility]

## 2018-02-07 ENCOUNTER — Ambulatory Visit: Payer: Medicare Other | Admitting: Student in an Organized Health Care Education/Training Program

## 2018-02-07 ENCOUNTER — Encounter: Payer: Self-pay | Admitting: Student

## 2018-02-07 ENCOUNTER — Ambulatory Visit (INDEPENDENT_AMBULATORY_CARE_PROVIDER_SITE_OTHER): Payer: Medicare Other | Admitting: Student

## 2018-02-07 ENCOUNTER — Other Ambulatory Visit: Payer: Self-pay

## 2018-02-07 VITALS — BP 118/68 | HR 80 | Temp 98.7°F | Wt 217.0 lb

## 2018-02-07 DIAGNOSIS — M25561 Pain in right knee: Secondary | ICD-10-CM

## 2018-02-07 DIAGNOSIS — G8929 Other chronic pain: Secondary | ICD-10-CM | POA: Diagnosis not present

## 2018-02-07 MED ORDER — METHYLPREDNISOLONE ACETATE 40 MG/ML IJ SUSP
40.0000 mg | Freq: Once | INTRAMUSCULAR | Status: AC
  Administered 2018-02-07: 40 mg via INTRAMUSCULAR

## 2018-02-07 NOTE — Progress Notes (Signed)
  Subjective:    Dennis Fischer is a 72 y.o. old male here for steroid injection to the right knee  HPI Right knee pain: Chronic issue for the last 2 to 3 years.  No history of trauma, injury or procedure.  He says he feels the pain right in his joint.  Says his pain is getting worse gradually.  Pain worse with driving or walking.  He denies swelling, fever, chills or overlying skin color change.  Has tried Tylenol without improvement.  Denies history of gout.   He was seen in clinic about 2 weeks ago.  X-ray obtained and showed mild degenerative change consistent with osteoarthritis.  He was recommended he return to clinic for steroid injection by PCP.  Has history of bilateral hip replacement.  DG hip right without evidence of complication or acute finding.  Has history of prostate cancer status post radiation.  He is followed by oncology and he is on Zytiga.  PMH/Problem List: has Prostate cancer ([place]); Right knee pain; and Encounter to establish care on their problem list.   has a past medical history of Cancer ([place]) and Hypertension.  FH:  No family history on file.  SH Social History   Tobacco Use  . Smoking status: Former Research scientist (life sciences)  . Smokeless tobacco: Never Used  Substance Use Topics  . Alcohol use: Yes    Comment: Once every 2 weeks.   . Drug use: Yes    Types: Marijuana    Comment: Medical     Review of Systems Review of systems negative except for pertinent positives and negatives in history of present illness above.     Objective:     Vitals:   02/07/18 1112  BP: 118/68  Pulse: 80  Temp: 98.7 F (37.1 C)  TempSrc: Oral  SpO2: 98%  Weight: 217 lb (98.4 kg)   Body mass index is 32.05 kg/m.  Physical Exam  GEN: appears well & comfortable. No apparent distress. RESP: no IWOB MSK:  Right knee Appears symmetric to left knee Normal to inspection with no erythema or effusion or obvious bony abnormalities. No warmth to touch. Has  joint line tenderness medially  and laterally.  No tenderness elsewhere ROM full in flexion and extension and lower leg rotation. Patellar glide without crepitus. Ligaments with solid consistent endpoints including ACL, PCL, LCL, MCL. No significant McMurray sign Antalgic gait  Neurovascularly intact with good distal pulses.  SKIN: As above NEURO: As above PSYCH: euthymic mood with congruent affect    Assessment and Plan:  1. Chronic pain of right knee: Likely due to osteoarthritis.  DG right knee with mild degenerative changes.  He has no constitutional symptoms nor signs suggestive for infectious etiology.  Discussed about treatment options including topical NSAID and steroid injection.  He is interested in a steroid injection which was administered as below. - methylPREDNISolone acetate (DEPO-MEDROL) injection 40 mg   Procedure:  Injection of right knee Consent obtained and verified.  Allergies reviewed Approach: Lateral Noted no overlying erythema, induration, or other signs of local infection. Patient not on anticoagulants  Skin prepped in a sterile fashion. Topical analgesic spray: Ethyl chloride. Injection completed without difficulty with Solumedrol 40 mg (1cc) and Lidocaine 1% (4cc) Patient tolerated the procedure well. Advised to call if fevers/chills, erythema, induration, drainage, or persistent bleeding.  Return if symptoms worsen or fail to improve.  Mercy Riding, MD 02/07/18 Pager: [phone]

## 2018-02-07 NOTE — Patient Instructions (Signed)
It was great seeing you today! We have addressed the following issues today  Right knee pain: We gave you a steroid injection today.  Please let us know if you have worsening of pain, swelling, skin redness or other symptoms concerning to you.  If we did any lab work today, and the results require attention, either me or my nurse will get in touch with you. If everything is normal, you will get a letter in mail and a message via . If you don't hear from [place] in two weeks, please give [place] a call. Otherwise, we look forward to seeing you again at your next visit. If you have any questions or concerns before then, please call the clinic at [phone].  Please bring all your medications to every doctors visit  Sign up for My Chart to have easy access to your labs results, and communication with your Primary care physician.    Please check-out at the front desk before leaving the clinic.    Take Care,   Dr. Cyndia Skeeters

## 2018-02-13 ENCOUNTER — Inpatient Hospital Stay: Payer: Medicare Other

## 2018-02-13 ENCOUNTER — Inpatient Hospital Stay: Payer: Medicare Other | Attending: Oncology

## 2018-02-13 ENCOUNTER — Inpatient Hospital Stay (HOSPITAL_BASED_OUTPATIENT_CLINIC_OR_DEPARTMENT_OTHER): Payer: Medicare Other | Admitting: Oncology

## 2018-02-13 ENCOUNTER — Telehealth: Payer: Self-pay | Admitting: Oncology

## 2018-02-13 VITALS — BP 132/86 | HR 78 | Temp 99.1°F | Resp 17 | Ht 69.0 in | Wt 214.3 lb

## 2018-02-13 DIAGNOSIS — C7951 Secondary malignant neoplasm of bone: Secondary | ICD-10-CM | POA: Diagnosis not present

## 2018-02-13 DIAGNOSIS — Z7982 Long term (current) use of aspirin: Secondary | ICD-10-CM | POA: Diagnosis not present

## 2018-02-13 DIAGNOSIS — C61 Malignant neoplasm of prostate: Secondary | ICD-10-CM

## 2018-02-13 DIAGNOSIS — I10 Essential (primary) hypertension: Secondary | ICD-10-CM | POA: Diagnosis not present

## 2018-02-13 DIAGNOSIS — E876 Hypokalemia: Secondary | ICD-10-CM | POA: Diagnosis not present

## 2018-02-13 DIAGNOSIS — Z79818 Long term (current) use of other agents affecting estrogen receptors and estrogen levels: Secondary | ICD-10-CM | POA: Insufficient documentation

## 2018-02-13 DIAGNOSIS — Z79899 Other long term (current) drug therapy: Secondary | ICD-10-CM | POA: Diagnosis not present

## 2018-02-13 LAB — CMP (CANCER CENTER ONLY)
ALT: 14 U/L (ref 0–55)
AST: 15 U/L (ref 5–34)
Albumin: 3.7 g/dL (ref 3.5–5.0)
Alkaline Phosphatase: 212 U/L — ABNORMAL HIGH (ref 40–150)
Anion gap: 10 (ref 3–11)
BUN: 12 mg/dL (ref 7–26)
CO2: 23 mmol/L (ref 22–29)
Calcium: 9.2 mg/dL (ref 8.4–10.4)
Chloride: 104 mmol/L (ref 98–109)
Creatinine: 1.06 mg/dL (ref 0.70–1.30)
GFR, Est AFR Am: >60 mL/min (ref >60)
GFR, Estimated: >60 mL/min (ref >60)
Glucose, Bld: 111 mg/dL (ref 70–140)
Potassium: 3.4 mmol/L — ABNORMAL LOW (ref 3.5–5.1)
Sodium: 137 mmol/L (ref 136–145)
Total Bilirubin: 0.9 mg/dL (ref 0.2–1.2)
Total Protein: 7.6 g/dL (ref 6.4–8.3)

## 2018-02-13 LAB — CBC WITH DIFFERENTIAL (CANCER CENTER ONLY)
Basophils Absolute: 0.1 10*3/uL (ref 0.0–0.1)
Basophils Relative: 1 %
Eosinophils Absolute: 0.2 10*3/uL (ref 0.0–0.5)
Eosinophils Relative: 2 %
HCT: 36.6 % — ABNORMAL LOW (ref 38.4–49.9)
Hemoglobin: 12.3 g/dL — ABNORMAL LOW (ref 13.0–17.1)
Lymphocytes Relative: 21 %
Lymphs Abs: 1.7 10*3/uL (ref 0.9–3.3)
MCH: 29.5 pg (ref 27.2–33.4)
MCHC: 33.6 g/dL (ref 32.0–36.0)
MCV: 87.8 fL (ref 79.3–98.0)
Monocytes Absolute: 0.8 10*3/uL (ref 0.1–0.9)
Monocytes Relative: 9 %
Neutro Abs: 5.5 10*3/uL (ref 1.5–6.5)
Neutrophils Relative %: 67 %
Platelet Count: 243 10*3/uL (ref 140–400)
RBC: 4.17 MIL/uL — ABNORMAL LOW (ref 4.20–5.82)
RDW: 14.4 % (ref 11.0–14.6)
WBC Count: 8.3 10*3/uL (ref 4.0–10.3)

## 2018-02-13 MED ORDER — LEUPROLIDE ACETATE (4 MONTH) 30 MG IM KIT
30.0000 mg | PACK | Freq: Once | INTRAMUSCULAR | Status: AC
  Administered 2018-02-13: 30 mg via INTRAMUSCULAR
  Filled 2018-02-13: qty 30

## 2018-02-13 NOTE — Telephone Encounter (Signed)
Scheduled appt per 6/6 los - gave patient aVS and calender per los.  

## 2018-02-13 NOTE — Progress Notes (Signed)
Hematology and Oncology Follow Up Visit  Dennis Fischer [id number] 05-12-1946 72 y.o. 02/13/2018 9:19 AM [name], MDGarba, Henderson Newcomer, MD   Principle Diagnosis: 59 year old man with prostate cancer with castration-resistant after presenting with Gleason score of 7 and a PSA of 12.8.  He developed disease to the bone subsequently castration resistant disease in 2017.  Prior Therapy: He received definitive therapy using radiation therapy and androgen deprivation in 2016.  He developed advanced disease with pelvic adenopathy and was treated with androgen deprivation while was living in [place].  He subsequently developed castration resistant disease.  Current therapy:   Lupron 30 mg every 4 months.    Zytiga 1000 mg daily with prednisone at 5 mg daily restarted on 03/14/2017.    Interim History:  [name] is here for a follow-up visit.  Since the last visit, he continues to be in excellent health and shape without any recent complaints.  He denies any complications related to Zytiga.  He does not report any edema, excessive fatigue or GI complaints.  He has no difficulties obtaining or taking this medication regularly.  He does not report any complications related to the Lupron without any hot flashes or side effects.  He does report knee pain which is chronic in nature related to arthritis.  He denies any other bone pain, pathological fractures or any arthralgias.  He remains ambulatory and attends activities of daily living.  He does not report any headaches, blurry vision, syncope or seizures.  He denies any alteration in mental status, psychiatric issues or depression.  He does not report any fevers, chills or sweats.  Changes in his weight or appetite.  He does not report any cough, wheezing or hemoptysis. He does not report any chest pain, palpitation, orthopnea. He does not report any frequency urgency or hesitancy.  He does not report any skin rashes or lesions.  He does not report  any lymphadenopathy or ecchymosis.  He denies any bleeding or thrombosis episodes.  Remaining review of systems is negative.   Medications: I have reviewed the patient's current medications.  Current Outpatient Medications  Medication Sig Dispense Refill  . abiraterone acetate (ZYTIGA) 250 MG tablet TAKE 4 TABLETS BY MOUTH ONCE DAILY. TAKE ON AN EMPTY STOMACH (AT LEAST1 HR BEFORE OR 2 HR AFTER EATING) SWALLOW WHOLE. 120 tablet 0  . Acetaminophen (TYLENOL PO) Take 500 mg by mouth daily as needed.    Marland Kitchen amLODipine (NORVASC) 10 MG tablet Take 10 mg by mouth daily.    Marland Kitchen aspirin 81 MG tablet Take 81 mg by mouth daily.    . carvedilol (COREG) 12.5 MG tablet Take 12.5 mg by mouth 2 (two) times daily.  3  . ergocalciferol (VITAMIN D2) 50000 units capsule Take 50,000 Units by mouth once a week.    Marland Kitchen levofloxacin (LEVAQUIN) 750 MG tablet Take 1 tablet (750 mg total) by mouth daily. X 7 days 7 tablet 0  . Multiple Vitamin (MULTIVITAMIN) tablet Take 1 tablet by mouth daily.    Marland Kitchen omeprazole (PRILOSEC) 20 MG capsule Take 20 mg by mouth daily.    . potassium chloride SA (K-DUR,KLOR-CON) 20 MEQ tablet TAKE 1 TABLET(20 MEQ) BY MOUTH DAILY 90 tablet 0  . sildenafil (VIAGRA) 100 MG tablet Take 100 mg by mouth daily as needed for erectile dysfunction.    . simvastatin (ZOCOR) 20 MG tablet Take 20 mg by mouth daily.     No current facility-administered medications for this visit.    Facility-Administered Medications Ordered  in Other Visits  Medication Dose Route Frequency Provider Last Rate Last Dose  . leuprolide (LUPRON) injection 30 mg  30 mg Intramuscular Once Wyatt Portela, MD         Allergies:  Allergies  Allergen Reactions  . Lisinopril Swelling    Facial swelling  . Mango Flavor   . Peanut-Containing Drug Products     Past Medical History, Surgical history, Social history, and Family History reviewed and unchanged at this time.    Physical Exam:  Blood pressure 132/86, pulse 78,  temperature 99.1 F (37.3 C), temperature source Oral, resp. rate 17, height 5\' 9"  (1.753 m), weight 214 lb 4.8 oz (97.2 kg), SpO2 100 %.   ECOG: 0 General appearance: Alert, awake gentleman without distress. Head: Cephalic without abnormalities. Oropharynx: No oral thrush or ulcers. Eyes: Pupils are equal and round reactive to light. Lymph nodes: No cervical, supraclavicular or axillary lymphadenopathy. Heart: Regular rate without any murmurs or gallops. Lung: Clear to auscultation without any rhonchi, wheezes or dullness to percussion. Abdomin: Soft, without any rebound or guarding.  No shifting dullness or ascites. Musculoskeletal: No joint deformity or effusion. Skin: No skin rashes or lesions. Neurological: Intact on exam motor, sensory exam.  Intact deep tendon reflexes.   Lab Results: Lab Results  Component Value Date   WBC 5.5 12/05/2017   HGB 12.7 (L) 12/05/2017   HCT 37.3 (L) 12/05/2017   MCV 89.1 12/05/2017   PLT 269 12/05/2017     Chemistry      Component Value Date/Time   NA 142 12/05/2017 0903   NA 141 08/08/2017 1116   K 3.3 (L) 12/05/2017 0903   K 3.6 08/08/2017 1116   CL 107 12/05/2017 0903   CO2 27 12/05/2017 0903   CO2 24 08/08/2017 1116   BUN 12 12/05/2017 0903   BUN 13.2 08/08/2017 1116   CREATININE 1.07 12/05/2017 0903   CREATININE 1.1 08/08/2017 1116      Component Value Date/Time   CALCIUM 9.5 12/05/2017 0903   CALCIUM 9.1 08/08/2017 1116   ALKPHOS 205 (H) 12/05/2017 0903   ALKPHOS 214 (H) 08/08/2017 1116   AST 16 12/05/2017 0903   AST 17 08/08/2017 1116   ALT 17 12/05/2017 0903   ALT 17 08/08/2017 1116   BILITOT 0.6 12/05/2017 0903   BILITOT 0.29 08/08/2017 1116       Results for JAIMIN, KRUPKA (MRN [id number]) as of 02/13/2018 09:21  Ref. Range 03/26/2017 14:18 06/06/2017 12:46 08/08/2017 11:16 10/10/2017 09:19 12/05/2017 09:03  Prostate Specific Ag, Serum Latest Ref Range: 0.0 - 4.0 ng/mL 45.2 (H) 30.1 (H) 35.1 (H) 41.7 (H) 50.2 (H)       Impression and Plan:  1. 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  He remains on Zytiga without any major complications although his PSA has been slowly rising after excellent initial response.  The natural course of this disease was reviewed again and the potential disease progression on Zytiga was also reviewed.  Before making any medication switch at this time, would like to restaging with CT scan and a bone scan and consider different salvage therapy at that time.  These options would include systemic chemotherapy, Xofigo among others.  2. Androgen depravation: He is currently on Lupron at 30 mg every 4 months which he will receive his next injection today and will be repeated every 4 months.  He has no complications continuing this medication.  3.  Hypokalemia: Potassium will be checked periodically and  remains on supplements.  His hypokalemia is related to Zytiga.  4. Hypertension: His blood pressure remains within normal range and no issues related to Zytiga.  5.  Goals of care: Treatment goal is palliative at this time although his performance status is excellent and aggressive therapy is warranted.  6.  Bone directed therapy: He is currently on vitamin D as well as calcium supplements.  Bone directed therapy may be an option if his bone scan showed worsening disease.  We will continue to address that with him after obtaining dental clearance.  7. Follow-up: In August 2019 after obtaining CT scan.  25  minutes was spent with the patient face-to-face today.  More than 50% of time was dedicated to patient counseling, education and answering question regarding his diagnosis and future plan of care including different salvage therapies.    Zola Button, MD 6/6/20199:19 AM

## 2018-02-14 LAB — PROSTATE-SPECIFIC AG, SERUM (LABCORP): Prostate Specific Ag, Serum: 74.9 ng/mL — ABNORMAL HIGH (ref 0.0–4.0)

## 2018-03-04 ENCOUNTER — Telehealth: Payer: Self-pay | Admitting: Pharmacist

## 2018-03-04 NOTE — Telephone Encounter (Signed)
Oral Oncology Pharmacist Encounter  Received call from patient with questions about a letter he had received from [facility] regarding copayment assistance grants that is helping to cover the cost of his Zytiga.  Patient is receiving generic abiraterone acetate from [facility]. Letter from [employer] states that patient's current grant will term on 03/11/2018.  There are funds still left on his current grant.  I contacted [employer], prostate cancer funds are not open for reenrollment at this time.  Patient's grant will term on 03/11/2018 as stated in the letter.  I contacted [facility] for status of next fill of abiraterone and to obtain billing breakdown between patient's insurance and copayment grant. Fatima Sanger has been covering copayment of $175.67 Next shipment of abiraterone was delivered to patient on 03/04/2018  There are currently no open grant funds for prostate cancer from any foundation at this time. We had previously submitted an application to the manufacturer ([employer] patient assistance foundation) and patient had been denied at that time due to being over income for their program.  I called patient and discussed above. Patient states he is able to afford $175 copayment at this time. We will not attempt manufacturer assistance application at this time as patient's income has not changed since previous attempt.  I will continue to surveill for copayment grants to help patient cover his cost of abiraterone. Income information verified with patient so that grant can be secured if one becomes available. I will follow-up with patient at that time if I am able to secure him a copayment grant.  Patient also instructed that [facility] has copayment assistance team that can also help surveill copayment grants for him and he should ask for their assistance when he speaks with them for his next fill of abiraterone.  Patient expressed  appreciation and understanding. He knows to call the office with any additional questions or concerns.  Johny Drilling, PharmD, BCPS, BCOP  03/04/2018 3:55 PM Oral Oncology Clinic [phone]

## 2018-03-07 ENCOUNTER — Other Ambulatory Visit: Payer: Self-pay | Admitting: Oncology

## 2018-03-07 ENCOUNTER — Other Ambulatory Visit: Payer: Self-pay | Admitting: *Deleted

## 2018-03-07 DIAGNOSIS — C61 Malignant neoplasm of prostate: Secondary | ICD-10-CM

## 2018-03-07 MED ORDER — AMLODIPINE BESYLATE 10 MG PO TABS: 10.0000 mg | ORAL_TABLET | Freq: Every day | ORAL | 3 refills | Status: DC

## 2018-03-10 ENCOUNTER — Telehealth: Payer: Self-pay | Admitting: Pharmacist

## 2018-03-10 NOTE — Telephone Encounter (Signed)
Oral Chemotherapy Pharmacist Encounter  Successfully enrolled patient for copayment assistance funds from Patient [facility]) from the [place] amount: $7500 Effective dates: 03/12/18 - 03/12/19 ID: [id number] BIN: [id number] Group: [id number] PCN: [id number]  I called and updated patient on re-enrollment into copayment grant foundation.  Patient informed that due to his lower copays for generic abiraterone, he will not use the entire grant amount in this next enrollment period. He will receive a letter about his enrollment ending and leftover funds just as he did this year.  His billing ID is the same as this past enrollment term. Pt fills his abiraterone at [facility]. They should be able to continue to process his claims as before without interruption.  Patient expressed understanding and appreciation.  Johny Drilling, PharmD, BCPS, BCOP 03/10/2018 8:59 AM Oral Oncology Clinic [phone]

## 2018-03-21 ENCOUNTER — Other Ambulatory Visit: Payer: Self-pay | Admitting: Oncology

## 2018-03-21 DIAGNOSIS — C61 Malignant neoplasm of prostate: Secondary | ICD-10-CM

## 2018-04-08 ENCOUNTER — Other Ambulatory Visit: Payer: Self-pay

## 2018-04-08 ENCOUNTER — Ambulatory Visit (INDEPENDENT_AMBULATORY_CARE_PROVIDER_SITE_OTHER): Payer: Medicare Other | Admitting: Student in an Organized Health Care Education/Training Program

## 2018-04-08 ENCOUNTER — Encounter: Payer: Self-pay | Admitting: Student in an Organized Health Care Education/Training Program

## 2018-04-08 VITALS — BP 134/74 | HR 63 | Ht 69.0 in | Wt 213.0 lb

## 2018-04-08 DIAGNOSIS — Z79899 Other long term (current) drug therapy: Secondary | ICD-10-CM | POA: Diagnosis not present

## 2018-04-08 DIAGNOSIS — Z23 Encounter for immunization: Secondary | ICD-10-CM | POA: Diagnosis not present

## 2018-04-08 NOTE — Progress Notes (Signed)
   CC: Medication reconciliation  HPI: Dennis Fischer is a 72 y.o. male .  Patient presents today to update his medication list, particularly Coreg.  He states that he was concerned that the Coreg is not in our system from his previous provider.  He does not have any other concerns or complaints.  Pneumococcal vaccination provided at today's visit  Review of Symptoms:  See HPI for ROS.   CC, SH/smoking status, and VS noted.  Objective: BP 134/74   Pulse 63   Ht 5\' 9"  (1.753 m)   Wt 213 lb (96.6 kg)   SpO2 98%   BMI 31.45 kg/m  GEN: NAD, alert, cooperative, and pleasant. RESPIRATORY: Comfortable work of breathing CARD: Regular rate noted  NEURO: II-XII grossly intact PSYCH: AAOx3, appropriate affect   Assessment and plan:  Encounter for medication reconciliation Medication list reviewed and updated in chart.  Desires refills were provided.  [name], MD,MS,  PGY3 04/08/2018 9:48 AM

## 2018-04-08 NOTE — Patient Instructions (Signed)
It was a pleasure seeing you today in our clinic.   Our clinic's number is [phone]. Please call with questions or concerns about what we discussed today.  Be well, Dr. Kimbree Casanas   

## 2018-04-11 ENCOUNTER — Encounter (HOSPITAL_COMMUNITY)
Admission: RE | Admit: 2018-04-11 | Discharge: 2018-04-11 | Disposition: A | Discharge status: Home / Self Care | Payer: Medicare Other | Source: Ambulatory Visit | Attending: Oncology | Admitting: Oncology

## 2018-04-11 ENCOUNTER — Inpatient Hospital Stay: Payer: Medicare Other | Attending: Oncology

## 2018-04-11 ENCOUNTER — Ambulatory Visit (HOSPITAL_COMMUNITY)
Admission: RE | Admit: 2018-04-11 | Discharge: 2018-04-11 | Disposition: A | Discharge status: Home / Self Care | Payer: Medicare Other | Source: Ambulatory Visit | Attending: Oncology | Admitting: Oncology

## 2018-04-11 DIAGNOSIS — Z79899 Other long term (current) drug therapy: Secondary | ICD-10-CM | POA: Diagnosis not present

## 2018-04-11 DIAGNOSIS — K449 Diaphragmatic hernia without obstruction or gangrene: Secondary | ICD-10-CM | POA: Diagnosis not present

## 2018-04-11 DIAGNOSIS — I700 Atherosclerosis of aorta: Secondary | ICD-10-CM | POA: Insufficient documentation

## 2018-04-11 DIAGNOSIS — M899 Disorder of bone, unspecified: Secondary | ICD-10-CM | POA: Diagnosis not present

## 2018-04-11 DIAGNOSIS — E876 Hypokalemia: Secondary | ICD-10-CM | POA: Diagnosis not present

## 2018-04-11 DIAGNOSIS — Z5111 Encounter for antineoplastic chemotherapy: Secondary | ICD-10-CM | POA: Insufficient documentation

## 2018-04-11 DIAGNOSIS — Z7689 Persons encountering health services in other specified circumstances: Secondary | ICD-10-CM | POA: Insufficient documentation

## 2018-04-11 DIAGNOSIS — C61 Malignant neoplasm of prostate: Secondary | ICD-10-CM

## 2018-04-11 DIAGNOSIS — C7951 Secondary malignant neoplasm of bone: Secondary | ICD-10-CM | POA: Insufficient documentation

## 2018-04-11 DIAGNOSIS — R937 Abnormal findings on diagnostic imaging of other parts of musculoskeletal system: Secondary | ICD-10-CM | POA: Diagnosis not present

## 2018-04-11 LAB — CMP (CANCER CENTER ONLY)
ALT: 15 U/L (ref 0–44)
AST: 15 U/L (ref 15–41)
Albumin: 3.7 g/dL (ref 3.5–5.0)
Alkaline Phosphatase: 211 U/L — ABNORMAL HIGH (ref 38–126)
Anion gap: 10 (ref 5–15)
BUN: 12 mg/dL (ref 8–23)
CO2: 23 mmol/L (ref 22–32)
Calcium: 9 mg/dL (ref 8.9–10.3)
Chloride: 108 mmol/L (ref 98–111)
Creatinine: 1.03 mg/dL (ref 0.61–1.24)
GFR, Est AFR Am: >60 mL/min (ref >60)
GFR, Estimated: >60 mL/min (ref >60)
Glucose, Bld: 108 mg/dL — ABNORMAL HIGH (ref 70–99)
Potassium: 3.7 mmol/L (ref 3.5–5.1)
Sodium: 141 mmol/L (ref 135–145)
Total Bilirubin: 0.5 mg/dL (ref 0.3–1.2)
Total Protein: 7.3 g/dL (ref 6.5–8.1)

## 2018-04-11 LAB — CBC WITH DIFFERENTIAL (CANCER CENTER ONLY)
Basophils Absolute: 0 10*3/uL (ref 0.0–0.1)
Basophils Relative: 1 %
Eosinophils Absolute: 0.3 10*3/uL (ref 0.0–0.5)
Eosinophils Relative: 6 %
HCT: 36.4 % — ABNORMAL LOW (ref 38.4–49.9)
Hemoglobin: 12.2 g/dL — ABNORMAL LOW (ref 13.0–17.1)
Lymphocytes Relative: 32 %
Lymphs Abs: 1.6 10*3/uL (ref 0.9–3.3)
MCH: 29.1 pg (ref 27.2–33.4)
MCHC: 33.5 g/dL (ref 32.0–36.0)
MCV: 86.9 fL (ref 79.3–98.0)
Monocytes Absolute: 0.4 10*3/uL (ref 0.1–0.9)
Monocytes Relative: 8 %
Neutro Abs: 2.7 10*3/uL (ref 1.5–6.5)
Neutrophils Relative %: 53 %
Platelet Count: 230 10*3/uL (ref 140–400)
RBC: 4.19 MIL/uL — ABNORMAL LOW (ref 4.20–5.82)
RDW: 15.4 % — ABNORMAL HIGH (ref 11.0–14.6)
WBC Count: 5.1 10*3/uL (ref 4.0–10.3)

## 2018-04-11 IMAGING — NM NM BONE WHOLE BODY
2 series · 2 of 2 positions shown · non-contrast
Comparison: Bone scan of [DATE].

CLINICAL DATA: History of prostate cancer.  Acute right hip pain.

EXAM:
NUCLEAR MEDICINE WHOLE BODY BONE SCAN
TECHNIQUE: Whole body anterior and posterior images were obtained approximately
3 hours after intravenous injection of radiopharmaceutical.
RADIOPHARMACEUTICALS:  21.8 mCi [SK] MDP IV

[Series 1: wbr_bone_40 whole body · 2.66mm/px · 1 of 1 slices shown (1 of 2)]
[im 1/1]
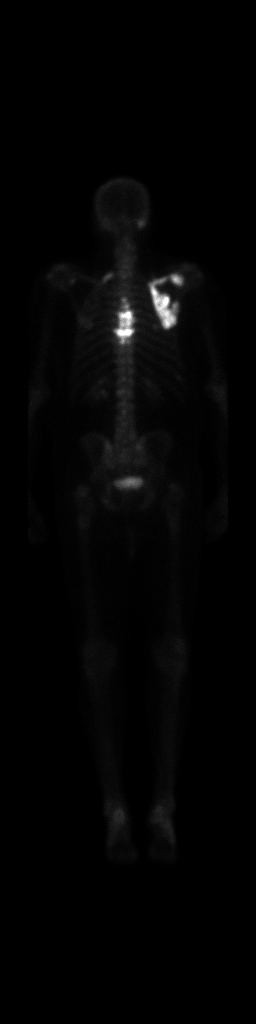

[Series 1: wbr_bone_40 whole body · 2.66mm/px · 1 of 1 slices shown (2 of 2)]
[im 1/1]
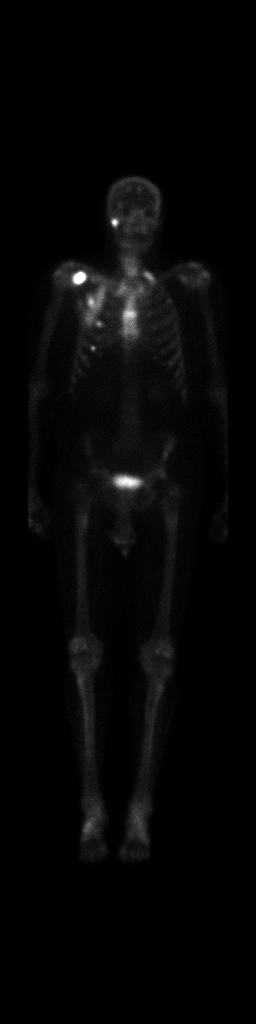

[2 of 2 positions shown; findings below may reference images not displayed]

FINDINGS: There is significantly increased abnormal uptake seen involving the
right scapula since prior exam, consistent with worsening metastatic
disease. Significantly increased abnormal uptake is also seen
involving the midthoracic spine consistent with worsening metastatic
disease. Interval development of focus of abnormal uptake seen
involving the right side of the base of the skull consistent with
metastatic disease. Two new foci of abnormal uptake are seen
involving the anterior portions of right ribs concerning for
metastatic disease. New focus of abnormal uptake is seen involving
posterior portion of left first rib consistent with metastatic
disease. At least 3 other new foci of abnormal uptake are seen
involving the anterior skull consistent with metastatic disease.
IMPRESSION: Interval development of multiple foci of abnormal uptake in the
skull consistent with metastatic disease. Multiple right rib lesions
are noted consistent with metastatic disease. Significantly
increased uptake is seen involving the right scapula and midthoracic
spine consistent with worsening metastatic disease.

## 2018-04-11 IMAGING — CT CT ABD-PELV W/ CM
2 of 5 series · 16 of 46 positions shown, 18 images · IV contrast (APPLIED)
Comparison: CT AP [DATE]

CLINICAL DATA: Prostate cancer.  Restaging.

EXAM:
CT ABDOMEN AND PELVIS WITH CONTRAST
TECHNIQUE: Multidetector CT imaging of the abdomen and pelvis was performed
using the standard protocol following bolus administration of
intravenous contrast.
CONTRAST:  100mL [Y1] IOPAMIDOL ([Y1]) INJECTION 61%

[Series 2: axial st · axial · 0.96mm/px · z∈[-839,-409]mm · 13 of 100 slices shown, 15 images]
[im 7/100  soft-tissue]
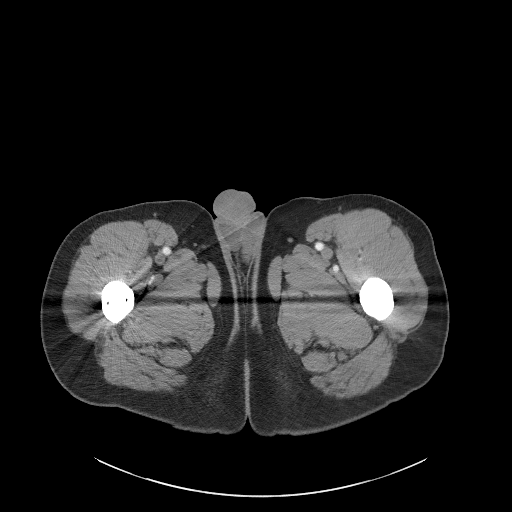
[im 7/100  bone]
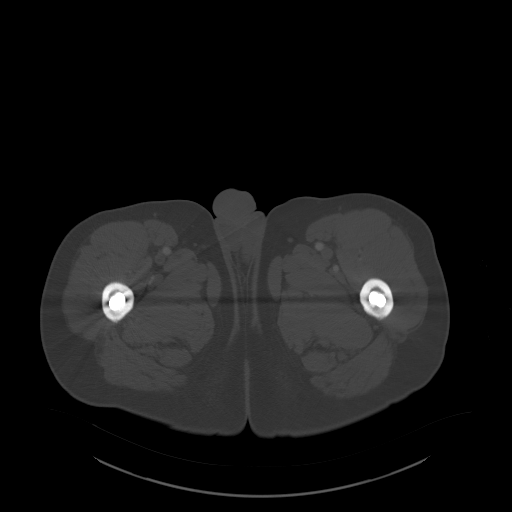
[im 13/100  soft-tissue]
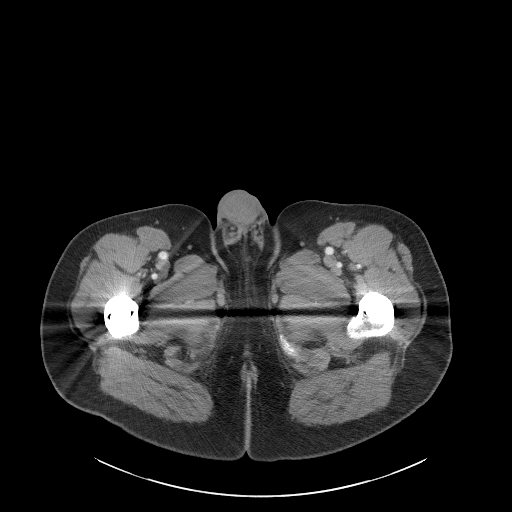
[im 19/100  soft-tissue]
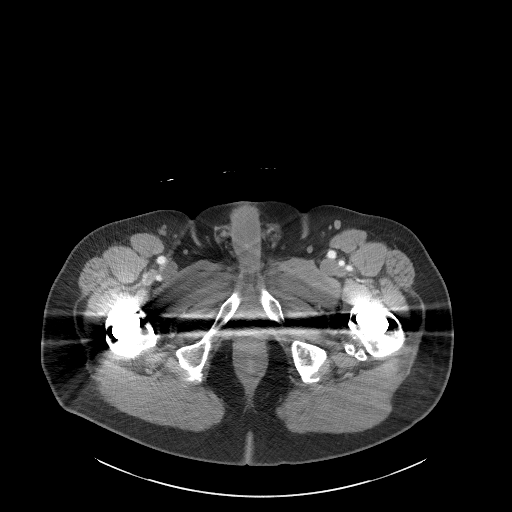
[im 31/100  soft-tissue]
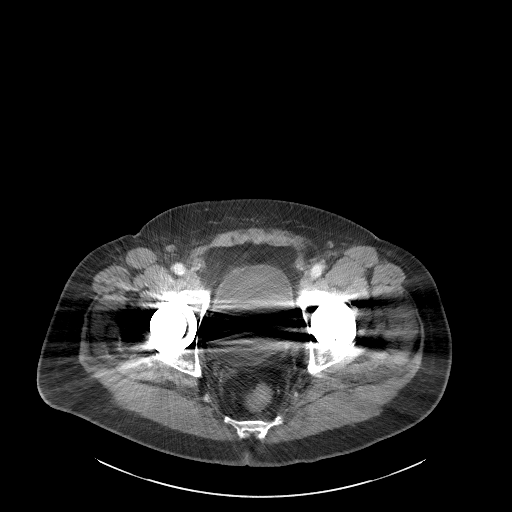
[im 38/100  soft-tissue]
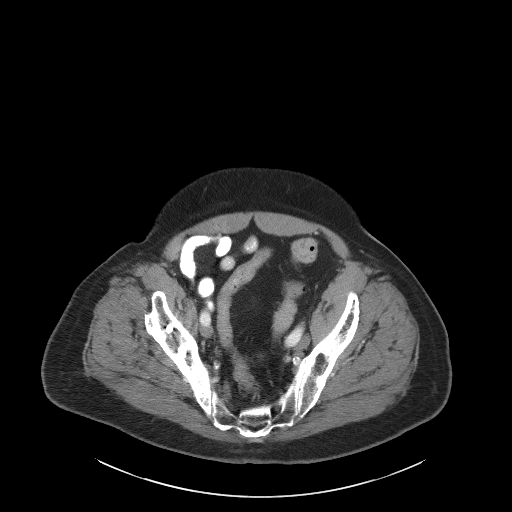
[im 44/100  soft-tissue]
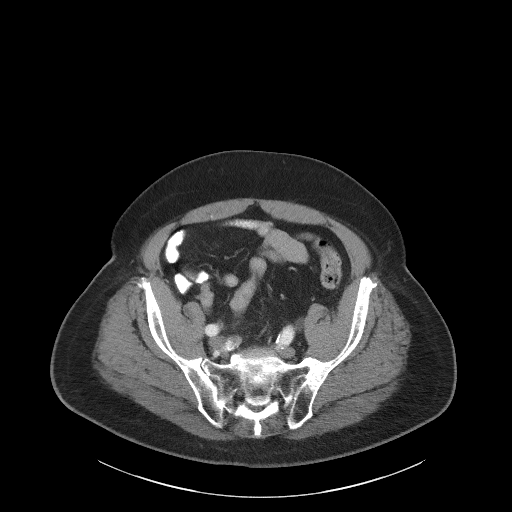
[im 50/100  soft-tissue]
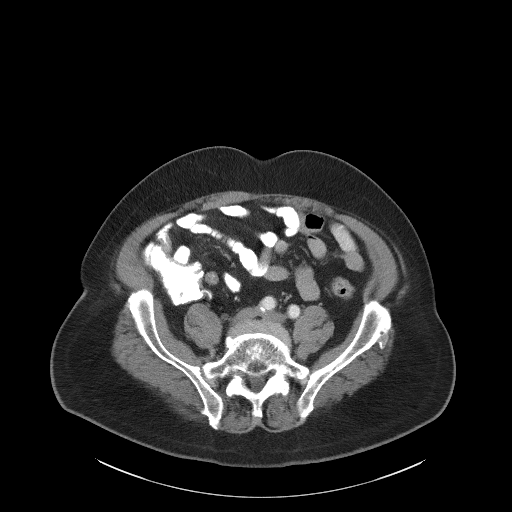
[im 56/100  soft-tissue]
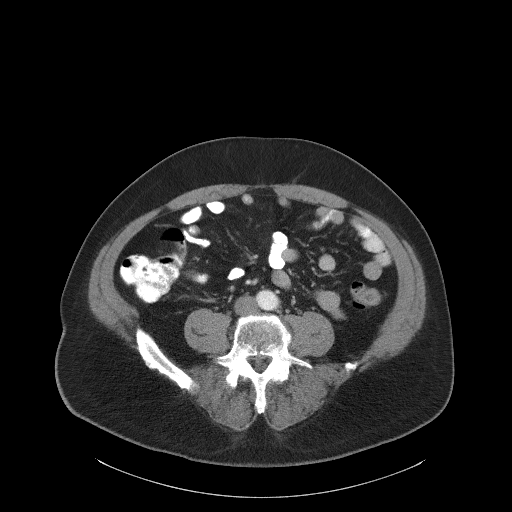
[im 62/100  soft-tissue]
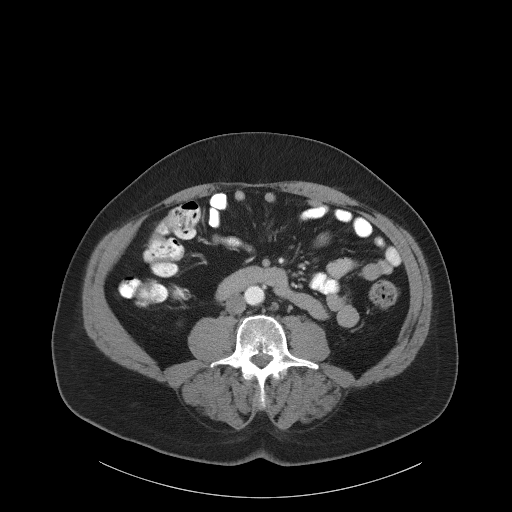
[im 62/100  bone]
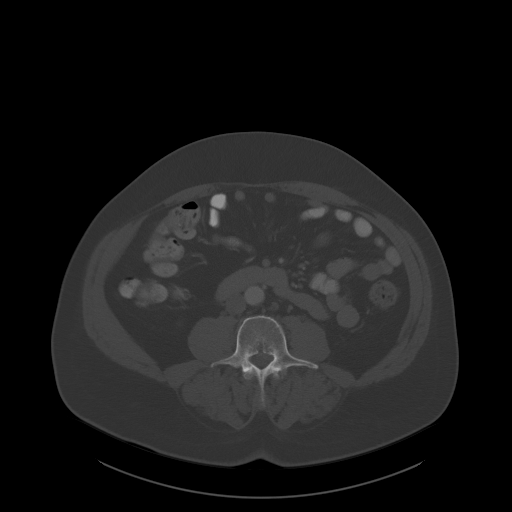
[im 69/100  soft-tissue]
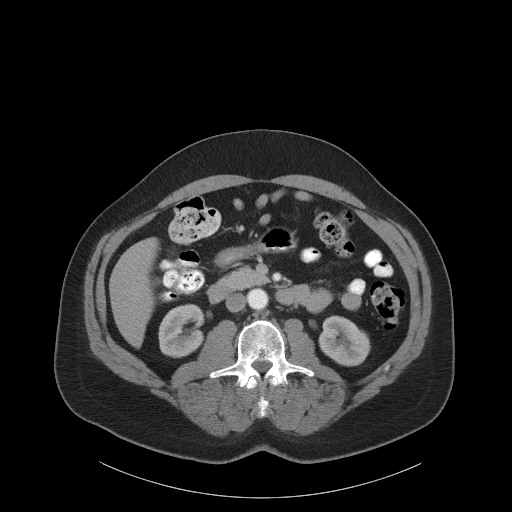
[im 81/100  soft-tissue]
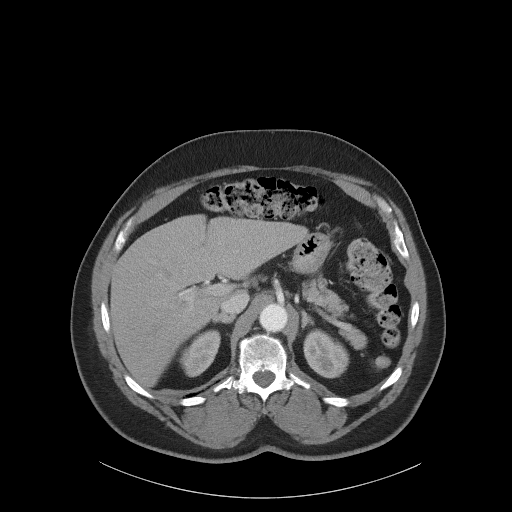
[im 87/100  soft-tissue]
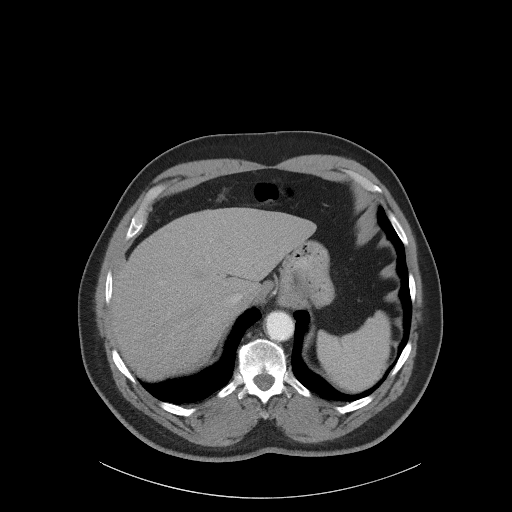
[im 93/100  soft-tissue]
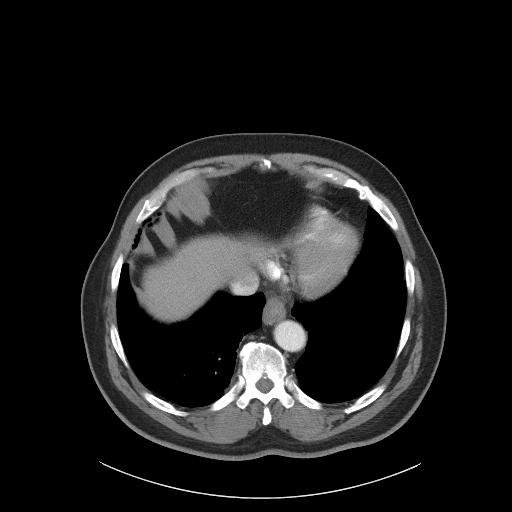

[Series 4: coronal st · coronal · 0.75mm/px · 3 of 112 slices shown]
[im 38/112  soft-tissue]
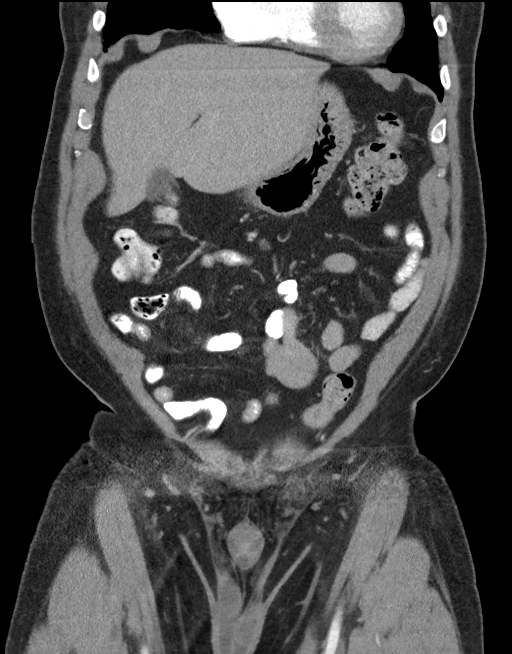
[im 50/112  soft-tissue]
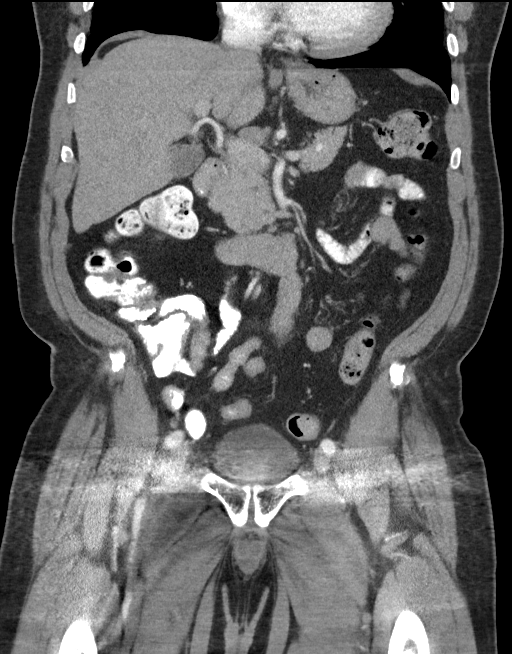
[im 62/112  soft-tissue]
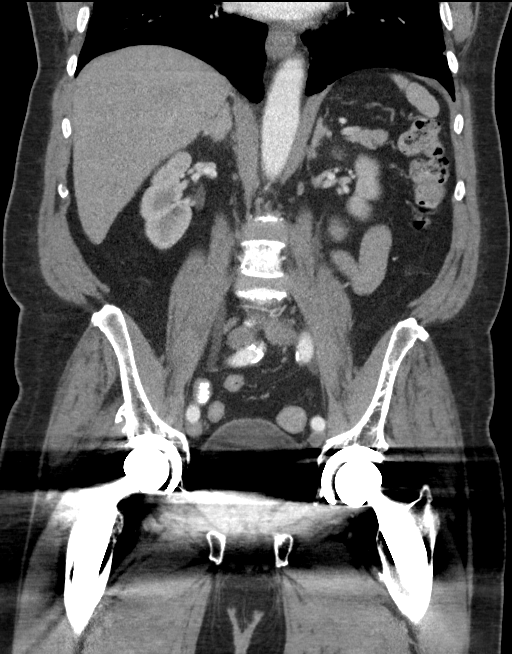

[16 of 46 positions shown; findings below may reference images not displayed]

FINDINGS: Lower chest: No acute abnormality. 4 mm subpleural nodule in the
left lower lobe is unchanged from previous exam. Adjacent left lower
lobe lung nodule measuring 3 mm is also unchanged.

Hepatobiliary: No focal liver abnormality is seen. No gallstones,
gallbladder wall thickening, or biliary dilatation.

Pancreas: Normal appearance of the pancreas.

Spleen: Spleen is unremarkable.

Adrenals/Urinary Tract: The adrenal glands are normal. Normal
appearance of both kidneys. No mass or hydronephrosis. Urinary
bladder is largely obscured by streak artifact from both hip
arthroplasty devices.

Stomach/Bowel: Small hiatal hernia. The small bowel loops have a
normal course and caliber. The appendix is visualized and appears
normal. Unremarkable appearance of the colon.

Vascular/Lymphatic: Aortic atherosclerosis noted. No aneurysm. No
adenopathy identified within the abdomen. No pelvic or inguinal
adenopathy identified. Streak artifact from bilateral hip
arthroplasty devices diminishes exam detail along the pelvic
sidewall nodal chains.

Reproductive: Seed implants identified within the prostate gland.

Other: No abdominal wall hernia or abnormality. No abdominopelvic
ascites.

Musculoskeletal: There is an anterolisthesis of L4 on L5. previous
bilateral hip arthroplasty. Subtle sclerotic lesion within the left
iliac bone is unchanged measuring 1 cm, image 55/2. No aggressive
lytic or sclerotic bone lesions identified
IMPRESSION: 1. No findings to suggest metastatic disease within the abdomen or
pelvis.
2. Small hiatal hernia.
3.  Aortic Atherosclerosis ([Y1]-[Y1]).

## 2018-04-11 MED ORDER — IOPAMIDOL (ISOVUE-300) INJECTION 61%
30.0000 mL | Freq: Once | INTRAVENOUS | Status: AC | PRN
Start: 2018-04-11 — End: 2018-04-11
  Administered 2018-04-11: 30 mL via ORAL

## 2018-04-11 MED ORDER — IOPAMIDOL (ISOVUE-300) INJECTION 61%
100.0000 mL | Freq: Once | INTRAVENOUS | Status: AC | PRN
  Administered 2018-04-11: 100 mL via INTRAVENOUS

## 2018-04-11 MED ORDER — TECHNETIUM TC 99M MEDRONATE IV KIT
21.8000 | PACK | Freq: Once | INTRAVENOUS | Status: AC | PRN
  Administered 2018-04-11: 21.8 via INTRAVENOUS

## 2018-04-11 MED ORDER — IOPAMIDOL (ISOVUE-300) INJECTION 61%
INTRAVENOUS | Status: DC
Start: 2018-04-11 — End: 2018-04-11
  Filled 2018-04-11: qty 100

## 2018-04-11 MED ORDER — IOPAMIDOL (ISOVUE-300) INJECTION 61%
INTRAVENOUS | Status: AC
  Filled 2018-04-11: qty 30

## 2018-04-12 LAB — PROSTATE-SPECIFIC AG, SERUM (LABCORP): Prostate Specific Ag, Serum: 100 ng/mL — ABNORMAL HIGH (ref 0.0–4.0)

## 2018-04-15 ENCOUNTER — Telehealth: Payer: Self-pay | Admitting: Oncology

## 2018-04-15 ENCOUNTER — Other Ambulatory Visit: Payer: Self-pay | Admitting: Oncology

## 2018-04-15 ENCOUNTER — Inpatient Hospital Stay (HOSPITAL_BASED_OUTPATIENT_CLINIC_OR_DEPARTMENT_OTHER): Payer: Medicare Other | Admitting: Oncology

## 2018-04-15 VITALS — BP 147/86 | HR 76 | Temp 98.9°F | Resp 17 | Ht 69.0 in | Wt 214.8 lb

## 2018-04-15 DIAGNOSIS — Z79899 Other long term (current) drug therapy: Secondary | ICD-10-CM | POA: Diagnosis not present

## 2018-04-15 DIAGNOSIS — C7951 Secondary malignant neoplasm of bone: Secondary | ICD-10-CM | POA: Diagnosis not present

## 2018-04-15 DIAGNOSIS — C61 Malignant neoplasm of prostate: Secondary | ICD-10-CM | POA: Diagnosis not present

## 2018-04-15 DIAGNOSIS — E876 Hypokalemia: Secondary | ICD-10-CM | POA: Diagnosis not present

## 2018-04-15 DIAGNOSIS — Z7689 Persons encountering health services in other specified circumstances: Secondary | ICD-10-CM | POA: Diagnosis not present

## 2018-04-15 DIAGNOSIS — Z5111 Encounter for antineoplastic chemotherapy: Secondary | ICD-10-CM | POA: Diagnosis not present

## 2018-04-15 MED ORDER — LIDOCAINE-PRILOCAINE 2.5-2.5 % EX CREA: 1.0000 "application " | TOPICAL_CREAM | CUTANEOUS | 0 refills | Status: DC | PRN

## 2018-04-15 MED ORDER — PROCHLORPERAZINE MALEATE 10 MG PO TABS: 10.0000 mg | ORAL_TABLET | Freq: Four times a day (QID) | ORAL | 0 refills | Status: DC | PRN

## 2018-04-15 MED ORDER — CALCIUM CARBONATE-VITAMIN D 500-200 MG-UNIT PO TABS: 1.0000 | ORAL_TABLET | Freq: Two times a day (BID) | ORAL | 3 refills | Status: DC

## 2018-04-15 NOTE — Progress Notes (Signed)
START ON PATHWAY REGIMEN - Prostate     A cycle is every 21 days:     Docetaxel      Prednisone   **Always confirm dose/schedule in your pharmacy ordering system**  Patient Characteristics: Adenocarcinoma, Metastatic, Castration Resistant, Symptomatic, Docetaxel Eligible Current radiographic evidence of distant metastasis<= Yes Histology: Adenocarcinoma AJCC T Category: Staged < 8th Ed. Gleason Primary: X AJCC N Category: N0 Gleason Secondary: X AJCC M Category: M1c Gleason Score: X AJCC 8 Stage Grouping: IVB PSA Values (ng/mL): X  Intent of Therapy: Non-Curative / Palliative Intent, Discussed with Patient

## 2018-04-15 NOTE — Telephone Encounter (Signed)
Scheduled appt per 8/6 los - gave patient AVS and calender per los. Per FS follow treatment plan - los incorrect for month of October -

## 2018-04-15 NOTE — Progress Notes (Signed)
Hematology and Oncology Follow Up Visit  Dennis Fischer [id number] Apr 18, 1946 72 y.o. 04/15/2018 10:26 AM [name], MDFeng, [name], MD   Principle Diagnosis: 72 year old man with castration-resistant diagnosed in 2017.  His Gleason score of 7 and a PSA of 12.8 with disease to the bone.  Prior Therapy: He received definitive therapy using radiation therapy and androgen deprivation in 2016.  He developed advanced disease with pelvic adenopathy and was treated with androgen deprivation while was living in [place].  He subsequently developed castration resistant disease.  Current therapy:   Lupron 30 mg every 4 months.    Zytiga 1000 mg daily restarted on 03/14/2017.    Interim History:  [name] presents today for a follow-up visit.  Since last visit, he reports no major changes or complications.  He continues to take Zytiga without any new complications.  He denies any excessive fatigue, tiredness or lower extremity edema.  He did report some occasional dizziness but no syncope or fall.  He denies any bone pain, pathological fractures.  He still ambulate and attends to activities of daily living.  His appetite and weight is unchanged.  His performance status remains excellent.  He does not report any headaches, blurry vision, syncope or seizures.  He denies any mood changes or confusion.  He does not report any fevers, chills or sweats.  He does not report any cough, wheezing or hemoptysis. He does not report any chest pain, palpitation, orthopnea. He does not report any frequency urgency or hesitancy.  He does not report any ecchymosis or petechiae.  He does not report any lymphadenopathy.  He denies any bleeding or thrombosis tendencies.  He denies any heat or cold intolerance..  Remaining review of systems is negative.   Medications: I have reviewed the patient's current medications.  Current Outpatient Medications  Medication Sig Dispense Refill  . abiraterone acetate (ZYTIGA) 250 MG  tablet TAKE 4 TABLETS BY MOUTH ONCE DAILY. TAKE ON AN EMPTY STOMACH (AT LEAST1 HR BEFORE OR 2 HR AFTER EATING) SWALLOW WHOLE. 120 tablet 0  . Acetaminophen (TYLENOL PO) Take 500 mg by mouth daily as needed.    Marland Kitchen amLODipine (NORVASC) 10 MG tablet Take 1 tablet (10 mg total) by mouth daily. 90 tablet 3  . aspirin 81 MG tablet Take 81 mg by mouth daily.    . calcium-vitamin D (OSCAL WITH D) 500-200 MG-UNIT tablet Take 1 tablet by mouth 2 (two) times daily. 60 tablet 3  . carvedilol (COREG) 12.5 MG tablet Take 12.5 mg by mouth 2 (two) times daily.  3  . ergocalciferol (VITAMIN D2) 50000 units capsule Take 50,000 Units by mouth once a week.    . lidocaine-prilocaine (EMLA) cream Apply 1 application topically as needed. 30 g 0  . Multiple Vitamin (MULTIVITAMIN) tablet Take 1 tablet by mouth daily.    Marland Kitchen omeprazole (PRILOSEC) 20 MG capsule Take 20 mg by mouth daily.    . potassium chloride SA (K-DUR,KLOR-CON) 20 MEQ tablet TAKE 1 TABLET(20 MEQ) BY MOUTH DAILY 90 tablet 0  . prochlorperazine (COMPAZINE) 10 MG tablet Take 1 tablet (10 mg total) by mouth every 6 (six) hours as needed for nausea or vomiting. 30 tablet 0  . sildenafil (VIAGRA) 100 MG tablet Take 100 mg by mouth daily as needed for erectile dysfunction.    . simvastatin (ZOCOR) 20 MG tablet Take 20 mg by mouth daily.     No current facility-administered medications for this visit.      Allergies:  Allergies  Allergen Reactions  . Lisinopril Swelling    Facial swelling  . Mango Flavor   . Peanut-Containing Drug Products     Past Medical History, Surgical history, Social history, and Family History reviewed and unchanged at this time.    Physical Exam:  Blood pressure (!) 147/86, pulse 76, temperature 98.9 F (37.2 C), temperature source Oral, resp. rate 17, height 5\' 9"  (1.753 m), weight 214 lb 12.8 oz (97.4 kg), SpO2 100 %.   ECOG: 0 General appearance: Well-appearing gentleman without distress. Head: Atraumatic without  normalities. Oropharynx: No oral thrush or ulcers. Eyes: Sclera anicteric. Lymph nodes: No lymphadenopathy noted in the cervical, supraclavicular or axillary lymph nodes. Heart: Regular rate and rhythm without any murmurs or gallops. Lung: Clear without any rhonchi, wheezes or dullness to percussion. Abdomin: Soft, nontender without any rebound or guarding. Musculoskeletal: No joint deformity or effusion. Skin: No ecchymosis or petechiae. Neurological: No deficits motor, sensory or deep tendon reflexes.   Lab Results: Lab Results  Component Value Date   WBC 5.1 04/11/2018   HGB 12.2 (L) 04/11/2018   HCT 36.4 (L) 04/11/2018   MCV 86.9 04/11/2018   PLT 230 04/11/2018     Chemistry      Component Value Date/Time   NA 141 04/11/2018 0853   NA 141 08/08/2017 1116   K 3.7 04/11/2018 0853   K 3.6 08/08/2017 1116   CL 108 04/11/2018 0853   CO2 23 04/11/2018 0853   CO2 24 08/08/2017 1116   BUN 12 04/11/2018 0853   BUN 13.2 08/08/2017 1116   CREATININE 1.03 04/11/2018 0853   CREATININE 1.1 08/08/2017 1116      Component Value Date/Time   CALCIUM 9.0 04/11/2018 0853   CALCIUM 9.1 08/08/2017 1116   ALKPHOS 211 (H) 04/11/2018 0853   ALKPHOS 214 (H) 08/08/2017 1116   AST 15 04/11/2018 0853   AST 17 08/08/2017 1116   ALT 15 04/11/2018 0853   ALT 17 08/08/2017 1116   BILITOT 0.5 04/11/2018 0853   BILITOT 0.29 08/08/2017 1116        Results for TRAVONTE, BYARD (MRN [id number]) as of 04/15/2018 10:01  Ref. Range 12/05/2017 09:03 02/13/2018 09:07 04/11/2018 08:53  Prostate Specific Ag, Serum Latest Ref Range: 0.0 - 4.0 ng/mL 50.2 (H) 74.9 (H) 100.0 (H)   EXAM: CT ABDOMEN AND PELVIS WITH CONTRAST  TECHNIQUE: Multidetector CT imaging of the abdomen and pelvis was performed using the standard protocol following bolus administration of intravenous contrast.  CONTRAST:  116mL ISOVUE-300 IOPAMIDOL (ISOVUE-300) INJECTION 61%  COMPARISON:  CT AP 01/18/2017  FINDINGS: Lower  chest: No acute abnormality. 4 mm subpleural nodule in the left lower lobe is unchanged from previous exam. Adjacent left lower lobe lung nodule measuring 3 mm is also unchanged.  Hepatobiliary: No focal liver abnormality is seen. No gallstones, gallbladder wall thickening, or biliary dilatation.  Pancreas: Normal appearance of the pancreas.  Spleen: Spleen is unremarkable.  Adrenals/Urinary Tract: The adrenal glands are normal. Normal appearance of both kidneys. No mass or hydronephrosis. Urinary bladder is largely obscured by streak artifact from both hip arthroplasty devices.  Stomach/Bowel: Small hiatal hernia. The small bowel loops have a normal course and caliber. The appendix is visualized and appears normal. Unremarkable appearance of the colon.  Vascular/Lymphatic: Aortic atherosclerosis noted. No aneurysm. No adenopathy identified within the abdomen. No pelvic or inguinal adenopathy identified. Streak artifact from bilateral hip arthroplasty devices diminishes exam detail along the pelvic sidewall nodal chains.  Reproductive: Seed implants identified within  the prostate gland.  Other: No abdominal wall hernia or abnormality. No abdominopelvic ascites.  Musculoskeletal: There is an anterolisthesis of L4 on L5. previous bilateral hip arthroplasty. Subtle sclerotic lesion within the left iliac bone is unchanged measuring 1 cm, image 55/2. No aggressive lytic or sclerotic bone lesions identified  IMPRESSION: 1. No findings to suggest metastatic disease within the abdomen or pelvis. 2. Small hiatal hernia. 3.  Aortic Atherosclerosis (ICD10-I70.0).  EXAM: NUCLEAR MEDICINE WHOLE BODY BONE SCAN  TECHNIQUE: Whole body anterior and posterior images were obtained approximately 3 hours after intravenous injection of radiopharmaceutical.  RADIOPHARMACEUTICALS:  21.8 mCi Technetium-59m MDP IV  COMPARISON:  Bone scan of Jan 18, 2017.  FINDINGS: There is  significantly increased abnormal uptake seen involving the right scapula since prior exam, consistent with worsening metastatic disease. Significantly increased abnormal uptake is also seen involving the midthoracic spine consistent with worsening metastatic disease. Interval development of focus of abnormal uptake seen involving the right side of the base of the skull consistent with metastatic disease. Two new foci of abnormal uptake are seen involving the anterior portions of right ribs concerning for metastatic disease. New focus of abnormal uptake is seen involving posterior portion of left first rib consistent with metastatic disease. At least 3 other new foci of abnormal uptake are seen involving the anterior skull consistent with metastatic disease.  IMPRESSION: Interval development of multiple foci of abnormal uptake in the skull consistent with metastatic disease. Multiple right rib lesions are noted consistent with metastatic disease. Significantly increased uptake is seen involving the right scapula and midthoracic spine consistent with worsening metastatic disease.   Impression and Plan:  1. Castration-resistant metastatic prostate cancer usually diagnosed in 2016 and developed bony metastasis 2017.  Has been on Zytiga intermittently since that time without any major toxicities.  His PSA has continued to rise with a doubling time of less than 6 months currently at 100 with increased from 50 back in March 2019.  His CT scan and bone scan were personally reviewed today and imaging were reviewed with the patient.  It showed clear progression of disease with multiple areas of bony involvement including the ribs, spine, right scapula as well as shoulder.  The natural course of this disease and treatment options were reviewed today in detail.  I feel his best option at this time would be Taxotere chemotherapy given the rapid rise in his PSA and disease volume.  Side effects  associated with this therapy would include nausea, vomiting, myelosuppression, fatigue, neuropathy, edema as well as infusion related complications.  The benefit would be reduction in his PSA as well as cancer volume.  Improvement in disease free survival as well as overall survival has been documented with this therapy.  After discussion today, discussing the risks and benefits of this approach is agreeable to proceed after attending chemotherapy education class.  2. Androgen depravation: Lupron will be continued every 4 months indefinitely.  3.  Hypokalemia: His potassium is back to within normal range and potassium supplements.  I have asked him to discontinue potassium as he will discontinue Zytiga in the immediate future.  4. Hypertension: Blood pressure is within normal range at this time.  5.  Goals of care: Treatment is palliative but his performance status is excellent and aggressive therapy is warranted.  6.  Bone directed therapy: I recommended continuing calcium and vitamin D.  Bone directed therapy will be addressed later date.  7.  IV access: Risks and benefits of Port-A-Cath insertion was  reviewed today.  He is agreeable to proceed without prior to his chemotherapy start.  EMLA cream will be prescribed to him.  8.  Antiemetics: Prescription for Compazine was made available to him.  9. Follow-up: in the few weeks to start chemotherapy.   25  minutes was spent with the patient face-to-face today.  More than 50% of time was dedicated to discussing the natural course of this disease including reviewing imaging studies and treatment options.   Zola Button, MD 8/6/201910:26 AM

## 2018-04-18 ENCOUNTER — Inpatient Hospital Stay: Payer: Medicare Other

## 2018-04-21 ENCOUNTER — Other Ambulatory Visit: Payer: Self-pay | Admitting: Physician Assistant

## 2018-04-21 ENCOUNTER — Encounter: Payer: Self-pay | Admitting: Physician Assistant

## 2018-04-22 ENCOUNTER — Ambulatory Visit (HOSPITAL_COMMUNITY)
Admission: RE | Admit: 2018-04-22 | Discharge: 2018-04-22 | Disposition: A | Discharge status: Home / Self Care | Payer: Medicare Other | Source: Ambulatory Visit | Attending: Oncology | Admitting: Oncology

## 2018-04-22 ENCOUNTER — Encounter (HOSPITAL_COMMUNITY): Payer: Self-pay

## 2018-04-22 ENCOUNTER — Other Ambulatory Visit: Payer: Self-pay | Admitting: Oncology

## 2018-04-22 DIAGNOSIS — Z7982 Long term (current) use of aspirin: Secondary | ICD-10-CM | POA: Insufficient documentation

## 2018-04-22 DIAGNOSIS — I10 Essential (primary) hypertension: Secondary | ICD-10-CM | POA: Diagnosis not present

## 2018-04-22 DIAGNOSIS — Z87891 Personal history of nicotine dependence: Secondary | ICD-10-CM | POA: Diagnosis not present

## 2018-04-22 DIAGNOSIS — C61 Malignant neoplasm of prostate: Secondary | ICD-10-CM

## 2018-04-22 DIAGNOSIS — Z452 Encounter for adjustment and management of vascular access device: Secondary | ICD-10-CM | POA: Diagnosis not present

## 2018-04-22 HISTORY — PX: IR IMAGING GUIDED PORT INSERTION: IMG5740

## 2018-04-22 LAB — CBC
HCT: 37.3 % — ABNORMAL LOW (ref 39.0–52.0)
Hemoglobin: 12.4 g/dL — ABNORMAL LOW (ref 13.0–17.0)
MCH: 29 pg (ref 26.0–34.0)
MCHC: 33.2 g/dL (ref 30.0–36.0)
MCV: 87.1 fL (ref 78.0–100.0)
Platelets: 317 10*3/uL (ref 150–400)
RBC: 4.28 MIL/uL (ref 4.22–5.81)
RDW: 14.8 % (ref 11.5–15.5)
WBC: 6.4 10*3/uL (ref 4.0–10.5)

## 2018-04-22 LAB — PROTIME-INR
INR: 1.05
Prothrombin Time: 13.7 seconds (ref 11.4–15.2)

## 2018-04-22 LAB — APTT: aPTT: 28 seconds (ref 24–36)

## 2018-04-22 IMAGING — XA IR FLUORO GUIDE CV LINE*L*
1 series · 1 of 1 positions shown · non-contrast
Comparison: none

CLINICAL DATA: Prostate cancer

[Series 300: line placements · 1 of 1 slices shown]
[im 1/1]
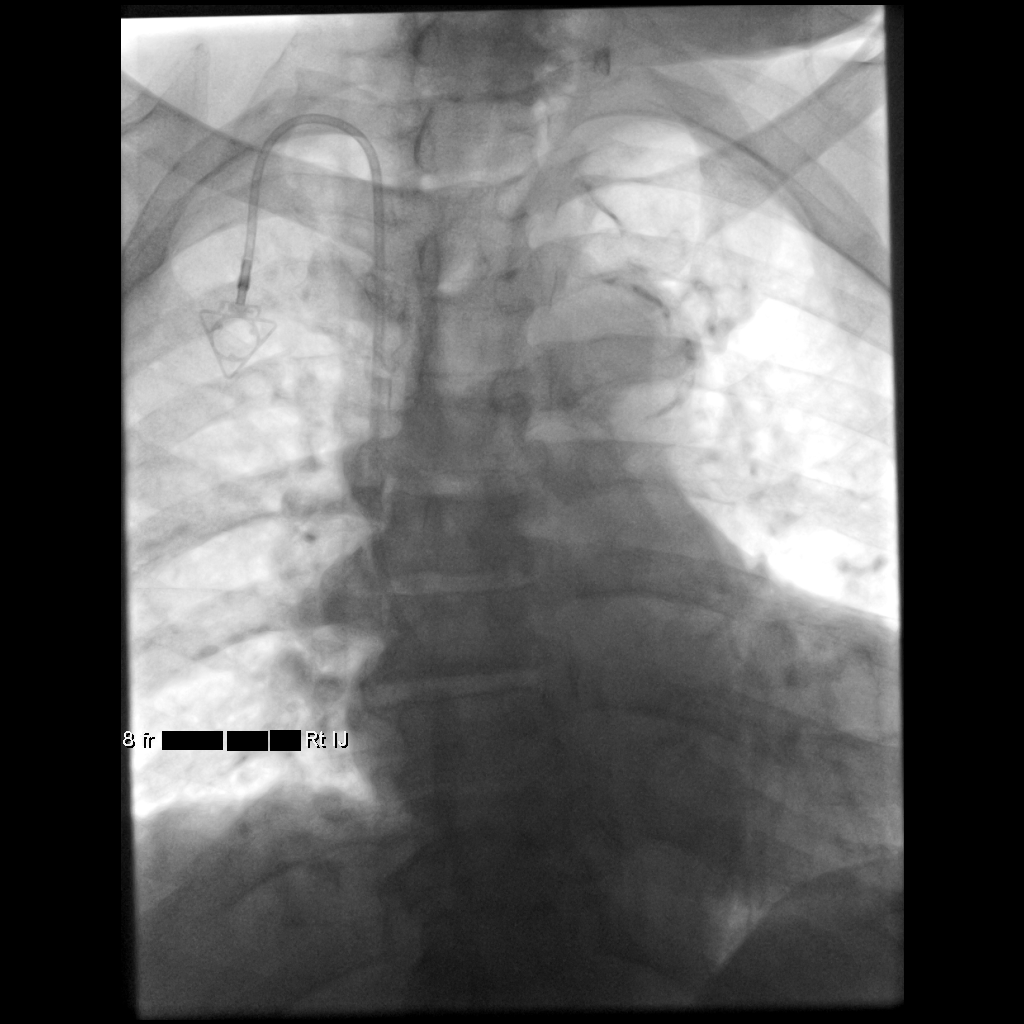

[1 of 1 positions shown; findings below may reference images not displayed]

EXAM:
RIGHT INTERNAL JUGULAR SINGLE LUMEN POWER PORT CATHETER INSERTION

Date:  [DATE] [DATE] [DATE]

Radiologist:  MERVIN

Guidance:  Ultrasound fluoroscopic

MEDICATIONS:
Ancef 2 g; The antibiotic was administered within an appropriate
time interval prior to skin puncture.

ANESTHESIA/SEDATION:
Versed 2.0 mg IV; Fentanyl 50 mcg IV;

Moderate Sedation Time:  24 minutes

The patient was continuously monitored during the procedure by the
interventional radiology nurse under my direct supervision.

FLUOROSCOPY TIME:  One minutes, 0 seconds (17 mGy)

COMPLICATIONS:
None immediate

CONTRAST:  None.

PROCEDURE:
Informed consent was obtained from the patient following explanation
of the procedure, risks, benefits and alternatives. The patient
understands, agrees and consents for the procedure. All questions
were addressed. A time out was performed.

Maximal barrier sterile technique utilized including caps, mask,
sterile gowns, sterile gloves, large sterile drape, hand hygiene,
and 2% chlorhexidine scrub.

Under sterile conditions and local anesthesia, right internal
jugular micropuncture venous access was performed. Access was
performed with ultrasound. Images were obtained for documentation of
the right internal jugular vein. A guide wire was inserted followed
by a transitional dilator. This allowed insertion of a guide wire
and catheter into the IVC. Measurements were obtained from the SVC /
RA junction back to the right IJ venotomy site. In the right
infraclavicular chest, a subcutaneous pocket was created over the
second anterior rib. This was done under sterile conditions and
local anesthesia. 1% lidocaine with epinephrine was utilized for
this. A 2.5 cm incision was made in the skin. Blunt dissection was
performed to create a subcutaneous pocket over the right pectoralis
major muscle. The pocket was flushed with saline vigorously. There
was adequate hemostasis. The port catheter was assembled and checked
for leakage. The port catheter was secured in the pocket with two
retention sutures. The tubing was tunneled subcutaneously to the
right venotomy site and inserted into the SVC/RA junction through a
valved peel-away sheath. Position was confirmed with fluoroscopy.
Images were obtained for documentation. The patient tolerated the
procedure well. No immediate complications. Incisions were closed in
a two layer fashion with 4 - 0 Vicryl suture. Dermabond was applied
to the skin. The port catheter was accessed, blood was aspirated
followed by saline and heparin flushes. Needle was removed. A dry
sterile dressing was applied.
IMPRESSION: Ultrasound and fluoroscopically guided right internal jugular single
lumen power port catheter insertion. Tip in the SVC/RA junction.
Catheter ready for use.

## 2018-04-22 IMAGING — US IR FLUORO GUIDE CV LINE*L*
1 series · 2 of 2 positions shown · non-contrast
Comparison: none

CLINICAL DATA: Prostate cancer

[Series 1: ir fluoro/shunt/fist · 2 of 2 slices shown]
[im 1/2]
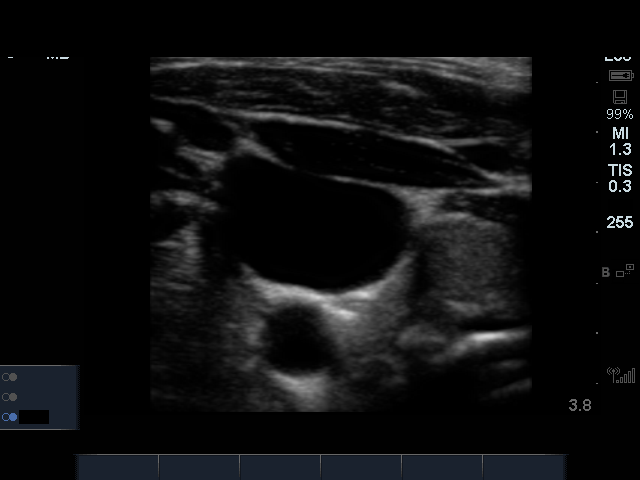
[im 2/2]
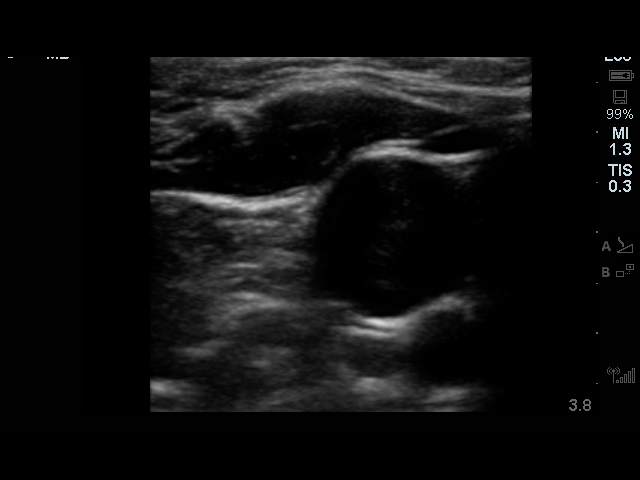

[2 of 2 positions shown; findings below may reference images not displayed]

EXAM:
RIGHT INTERNAL JUGULAR SINGLE LUMEN POWER PORT CATHETER INSERTION

Date:  [DATE] [DATE] [DATE]

Radiologist:  MERVIN

Guidance:  Ultrasound fluoroscopic

MEDICATIONS:
Ancef 2 g; The antibiotic was administered within an appropriate
time interval prior to skin puncture.

ANESTHESIA/SEDATION:
Versed 2.0 mg IV; Fentanyl 50 mcg IV;

Moderate Sedation Time:  24 minutes

The patient was continuously monitored during the procedure by the
interventional radiology nurse under my direct supervision.

FLUOROSCOPY TIME:  One minutes, 0 seconds (17 mGy)

COMPLICATIONS:
None immediate

CONTRAST:  None.

PROCEDURE:
Informed consent was obtained from the patient following explanation
of the procedure, risks, benefits and alternatives. The patient
understands, agrees and consents for the procedure. All questions
were addressed. A time out was performed.

Maximal barrier sterile technique utilized including caps, mask,
sterile gowns, sterile gloves, large sterile drape, hand hygiene,
and 2% chlorhexidine scrub.

Under sterile conditions and local anesthesia, right internal
jugular micropuncture venous access was performed. Access was
performed with ultrasound. Images were obtained for documentation of
the right internal jugular vein. A guide wire was inserted followed
by a transitional dilator. This allowed insertion of a guide wire
and catheter into the IVC. Measurements were obtained from the SVC /
RA junction back to the right IJ venotomy site. In the right
infraclavicular chest, a subcutaneous pocket was created over the
second anterior rib. This was done under sterile conditions and
local anesthesia. 1% lidocaine with epinephrine was utilized for
this. A 2.5 cm incision was made in the skin. Blunt dissection was
performed to create a subcutaneous pocket over the right pectoralis
major muscle. The pocket was flushed with saline vigorously. There
was adequate hemostasis. The port catheter was assembled and checked
for leakage. The port catheter was secured in the pocket with two
retention sutures. The tubing was tunneled subcutaneously to the
right venotomy site and inserted into the SVC/RA junction through a
valved peel-away sheath. Position was confirmed with fluoroscopy.
Images were obtained for documentation. The patient tolerated the
procedure well. No immediate complications. Incisions were closed in
a two layer fashion with 4 - 0 Vicryl suture. Dermabond was applied
to the skin. The port catheter was accessed, blood was aspirated
followed by saline and heparin flushes. Needle was removed. A dry
sterile dressing was applied.
IMPRESSION: Ultrasound and fluoroscopically guided right internal jugular single
lumen power port catheter insertion. Tip in the SVC/RA junction.
Catheter ready for use.

## 2018-04-22 MED ORDER — FENTANYL CITRATE (PF) 100 MCG/2ML IJ SOLN
INTRAMUSCULAR | Status: AC
  Filled 2018-04-22: qty 2

## 2018-04-22 MED ORDER — CEFAZOLIN SODIUM-DEXTROSE 2-4 GM/100ML-% IV SOLN
INTRAVENOUS | Status: AC
  Administered 2018-04-22: 2 g via INTRAVENOUS
  Filled 2018-04-22: qty 100

## 2018-04-22 MED ORDER — CEFAZOLIN SODIUM-DEXTROSE 2-4 GM/100ML-% IV SOLN
2.0000 g | Freq: Once | INTRAVENOUS | Status: AC
  Administered 2018-04-22: 2 g via INTRAVENOUS

## 2018-04-22 MED ORDER — FENTANYL CITRATE (PF) 100 MCG/2ML IJ SOLN
INTRAMUSCULAR | Status: AC | PRN
  Administered 2018-04-22 (×2): 50 ug via INTRAVENOUS

## 2018-04-22 MED ORDER — NALOXONE HCL 0.4 MG/ML IJ SOLN
INTRAMUSCULAR | Status: AC
  Filled 2018-04-22: qty 1

## 2018-04-22 MED ORDER — MIDAZOLAM HCL 2 MG/2ML IJ SOLN
INTRAMUSCULAR | Status: AC
  Filled 2018-04-22: qty 2

## 2018-04-22 MED ORDER — SODIUM CHLORIDE 0.9 % IV SOLN
INTRAVENOUS | Status: DC
Start: 2018-04-22 — End: 2018-04-23
  Administered 2018-04-22: 13:00:00 via INTRAVENOUS

## 2018-04-22 MED ORDER — FLUMAZENIL 0.5 MG/5ML IV SOLN
INTRAVENOUS | Status: AC
  Filled 2018-04-22: qty 5

## 2018-04-22 MED ORDER — MIDAZOLAM HCL 2 MG/2ML IJ SOLN
INTRAMUSCULAR | Status: AC | PRN
  Administered 2018-04-22 (×2): 1 mg via INTRAVENOUS

## 2018-04-22 MED ORDER — HEPARIN SOD (PORK) LOCK FLUSH 100 UNIT/ML IV SOLN
INTRAVENOUS | Status: AC | PRN
  Administered 2018-04-22: 500 [IU] via INTRAVENOUS

## 2018-04-22 MED ORDER — HEPARIN SOD (PORK) LOCK FLUSH 100 UNIT/ML IV SOLN
INTRAVENOUS | Status: AC
  Filled 2018-04-22: qty 5

## 2018-04-22 MED ORDER — LIDOCAINE-EPINEPHRINE (PF) 2 %-1:200000 IJ SOLN
INTRAMUSCULAR | Status: AC
  Filled 2018-04-22: qty 20

## 2018-04-22 NOTE — H&P (Signed)
Chief Complaint: Prostate cancer  Referring Physician(s): Wyatt Portela  Supervising Physician: Daryll Brod  Patient Status: [facility] - Out-pt  History of Present Illness: Dennis Fischer is a 72 y.o. male with castrate resistent prostate cancer originally diagnosed in 2016.  He developed metastatic disease in 2017.  Has been on Zytiga intermittently since that time without any major toxicities.    His PSA has continued to rise with a doubling time of less than 6 months.  PSA is currently at 100 which increased from 50 back in March 2019.    CT scan and bone scan showed clear progression of disease with multiple areas of bony involvement including the ribs, spine, right scapula as well as shoulder.  We are asked to place a Port A Cath for chemotherapy.  He is NPO. He feels well today. No fever/chills, no N/V. ROS negative.  Past Medical History:  Diagnosis Date  . Cancer [facility])    Prostate with chemotherapy  . Hypertension     Past Surgical History:  Procedure Laterality Date  . HIP SURGERY Bilateral     Allergies: Lisinopril; Mango flavor; and Peanut-containing drug products  Medications: Prior to Admission medications   Medication Sig Start Date End Date Taking? Authorizing Provider  Acetaminophen (TYLENOL PO) Take 500 mg by mouth daily as needed. 07/10/16  Yes [provider]  amLODipine (NORVASC) 10 MG tablet Take 1 tablet (10 mg total) by mouth daily. 03/07/18  Yes Lucila Maine C, DO  aspirin 81 MG tablet Take 81 mg by mouth daily. 07/10/16  Yes [provider]  calcium-vitamin D (OSCAL WITH D) 500-200 MG-UNIT tablet Take 1 tablet by mouth 2 (two) times daily. 04/15/18  Yes Wyatt Portela, MD  carvedilol (COREG) 12.5 MG tablet Take 12.5 mg by mouth 2 (two) times daily. 06/20/16  Yes [provider]  ergocalciferol (VITAMIN D2) 50000 units capsule Take 50,000 Units by mouth once a week. 07/10/16  Yes [provider]    Multiple Vitamin (MULTIVITAMIN) tablet Take 1 tablet by mouth daily. 07/10/16  Yes [provider]  omeprazole (PRILOSEC) 20 MG capsule Take 20 mg by mouth daily. 07/10/16  Yes [provider]  prochlorperazine (COMPAZINE) 10 MG tablet TAKE 1 TABLET(10 MG) BY MOUTH EVERY 6 HOURS AS NEEDED FOR NAUSEA OR VOMITING 04/15/18  Yes Wyatt Portela, MD  sildenafil (VIAGRA) 100 MG tablet Take 100 mg by mouth daily as needed for erectile dysfunction. 07/10/16  Yes [provider]  simvastatin (ZOCOR) 20 MG tablet Take 20 mg by mouth daily. 07/10/16  Yes [provider]  abiraterone acetate (ZYTIGA) 250 MG tablet TAKE 4 TABLETS BY MOUTH ONCE DAILY. TAKE ON AN EMPTY STOMACH (AT LEAST1 HR BEFORE OR 2 HR AFTER EATING) SWALLOW WHOLE. 03/21/18   Wyatt Portela, MD  lidocaine-prilocaine (EMLA) cream Apply 1 application topically as needed. 04/15/18   Wyatt Portela, MD  potassium chloride SA (K-DUR,KLOR-CON) 20 MEQ tablet TAKE 1 TABLET(20 MEQ) BY MOUTH DAILY 03/07/18   Wyatt Portela, MD     History reviewed. No pertinent family history.  Social History   Socioeconomic History  . Marital status: Divorced    Spouse name: Not on file  . Number of children: Not on file  . Years of education: Not on file  . Highest education level: Not on file  Occupational History  . Not on file  Social Needs  . Financial resource strain: Not on file  . Food insecurity:  Worry: Not on file    Inability: Not on file  . Transportation needs:    Medical: Not on file    Non-medical: Not on file  Tobacco Use  . Smoking status: Former Research scientist (life sciences)  . Smokeless tobacco: Never Used  Substance and Sexual Activity  . Alcohol use: Yes    Comment: Once every 2 weeks.   . Drug use: Yes    Types: Marijuana    Comment: Medical   . Sexual activity: Not on file  Lifestyle  . Physical activity:    Days per week: Not on file    Minutes per session: Not on file  . Stress: Not on file   Relationships  . Social connections:    Talks on phone: Not on file    Gets together: Not on file    Attends religious service: Not on file    Active member of club or organization: Not on file    Attends meetings of clubs or organizations: Not on file    Relationship status: Not on file  Other Topics Concern  . Not on file  Social History Narrative  . Not on file     Review of Systems: A 12 point ROS discussed and pertinent positives are indicated in the HPI above.  All other systems are negative. Review of Systems  Physical Exam  Constitutional: He is oriented to person, place, and time. He appears well-developed.  HENT:  Head: Normocephalic and atraumatic.  Eyes: EOM are normal.  Neck: Normal range of motion.  Cardiovascular: Normal rate, regular rhythm and normal heart sounds.  Pulmonary/Chest: Effort normal and breath sounds normal.  Abdominal: Soft.  Musculoskeletal: Normal range of motion.  Neurological: He is alert and oriented to person, place, and time.  Skin: Skin is warm and dry.  Psychiatric: He has a normal mood and affect. His behavior is normal. Judgment and thought content normal.    Imaging: Nm Bone Scan Whole Body  Result Date: 04/11/2018 CLINICAL DATA:  History of prostate cancer.  Acute right hip pain. EXAM: NUCLEAR MEDICINE WHOLE BODY BONE SCAN TECHNIQUE: Whole body anterior and posterior images were obtained approximately 3 hours after intravenous injection of radiopharmaceutical. RADIOPHARMACEUTICALS:  21.8 mCi Technetium-2m MDP IV COMPARISON:  Bone scan of Jan 18, 2017. FINDINGS: There is significantly increased abnormal uptake seen involving the right scapula since prior exam, consistent with worsening metastatic disease. Significantly increased abnormal uptake is also seen involving the midthoracic spine consistent with worsening metastatic disease. Interval development of focus of abnormal uptake seen involving the right side of the base of the skull  consistent with metastatic disease. Two new foci of abnormal uptake are seen involving the anterior portions of right ribs concerning for metastatic disease. New focus of abnormal uptake is seen involving posterior portion of left first rib consistent with metastatic disease. At least 3 other new foci of abnormal uptake are seen involving the anterior skull consistent with metastatic disease. IMPRESSION: Interval development of multiple foci of abnormal uptake in the skull consistent with metastatic disease. Multiple right rib lesions are noted consistent with metastatic disease. Significantly increased uptake is seen involving the right scapula and midthoracic spine consistent with worsening metastatic disease. Electronically Signed   By: Marijo Conception, M.D.   On: 04/11/2018 16:13   Ct Abdomen Pelvis W Contrast  Result Date: 04/11/2018 CLINICAL DATA:  Prostate cancer.  Restaging. EXAM: CT ABDOMEN AND PELVIS WITH CONTRAST TECHNIQUE: Multidetector CT imaging of the abdomen and pelvis was performed  using the standard protocol following bolus administration of intravenous contrast. CONTRAST:  180mL ISOVUE-300 IOPAMIDOL (ISOVUE-300) INJECTION 61% COMPARISON:  CT AP 01/18/2017 FINDINGS: Lower chest: No acute abnormality. 4 mm subpleural nodule in the left lower lobe is unchanged from previous exam. Adjacent left lower lobe lung nodule measuring 3 mm is also unchanged. Hepatobiliary: No focal liver abnormality is seen. No gallstones, gallbladder wall thickening, or biliary dilatation. Pancreas: Normal appearance of the pancreas. Spleen: Spleen is unremarkable. Adrenals/Urinary Tract: The adrenal glands are normal. Normal appearance of both kidneys. No mass or hydronephrosis. Urinary bladder is largely obscured by streak artifact from both hip arthroplasty devices. Stomach/Bowel: Small hiatal hernia. The small bowel loops have a normal course and caliber. The appendix is visualized and appears normal. Unremarkable  appearance of the colon. Vascular/Lymphatic: Aortic atherosclerosis noted. No aneurysm. No adenopathy identified within the abdomen. No pelvic or inguinal adenopathy identified. Streak artifact from bilateral hip arthroplasty devices diminishes exam detail along the pelvic sidewall nodal chains. Reproductive: Seed implants identified within the prostate gland. Other: No abdominal wall hernia or abnormality. No abdominopelvic ascites. Musculoskeletal: There is an anterolisthesis of L4 on L5. previous bilateral hip arthroplasty. Subtle sclerotic lesion within the left iliac bone is unchanged measuring 1 cm, image 55/2. No aggressive lytic or sclerotic bone lesions identified IMPRESSION: 1. No findings to suggest metastatic disease within the abdomen or pelvis. 2. Small hiatal hernia. 3.  Aortic Atherosclerosis (ICD10-I70.0). Electronically Signed   By: Kerby Moors M.D.   On: 04/11/2018 13:57    Labs:  CBC: Recent Labs    12/05/17 0903 02/13/18 0907 04/11/18 0853 04/22/18 1259  WBC 5.5 8.3 5.1 6.4  HGB 12.7* 12.3* 12.2* 12.4*  HCT 37.3* 36.6* 36.4* 37.3*  PLT 269 243 230 317    COAGS: Recent Labs    09/04/17 2137 04/22/18 1259  INR 1.12 1.05  APTT  --  28    BMP: Recent Labs    10/10/17 0917 12/05/17 0903 02/13/18 0907 04/11/18 0853  NA 141 142 137 141  K 3.4* 3.3* 3.4* 3.7  CL 107 107 104 108  CO2 25 27 23 23   GLUCOSE 110 110 111 108*  BUN 11 12 12 12   CALCIUM 9.1 9.5 9.2 9.0  CREATININE 1.04 1.07 1.06 1.03  GFRNONAA >60 >60 >60 >60  GFRAA >60 >60 >60 >60    LIVER FUNCTION TESTS: Recent Labs    10/10/17 0917 12/05/17 0903 02/13/18 0907 04/11/18 0853  BILITOT 0.5 0.6 0.9 0.5  AST 16 16 15 15   ALT 17 17 14 15   ALKPHOS 216* 205* 212* 211*  PROT 7.4 7.2 7.6 7.3  ALBUMIN 3.8 3.7 3.7 3.7    TUMOR MARKERS: No results for input(s): AFPTM, CEA, CA199, CHROMGRNA in the last 8760 hours.  Assessment and Plan:  Castrate resistant prostate cancer  Will proceed  with placement of a Port A Cath today by Dr. Annamaria Boots.  Risks and benefits of image guided port-a-catheter placement was discussed with the patient including, but not limited to bleeding, infection, pneumothorax, or fibrin sheath development and need for additional procedures.  All of the patient's questions were answered, patient is agreeable to proceed. Consent signed and in chart.  Thank you for this interesting consult.  I greatly enjoyed meeting Dennis Fischer and look forward to participating in their care.  A copy of this report was sent to the requesting provider on this date.  Electronically Signed: Murrell Redden, PA-C   04/22/2018, 2:18 PM  I spent a total of  30 Minutes in face to face in clinical consultation, greater than 50% of which was counseling/coordinating care for [facility] A Cath.

## 2018-04-22 NOTE — Procedures (Signed)
Prostate ca  S/p RT IJ POWER PORT Tip svcra No comp Stable EBL min Full report in pacs

## 2018-04-22 NOTE — Discharge Instructions (Addendum)
Implanted Port Home Guide °An implanted port is a type of central line that is placed under the skin. Central lines are used to provide IV access when treatment or nutrition needs to be given through a person’s veins. Implanted ports are used for long-term IV access. An implanted port may be placed because: °· You need IV medicine that would be irritating to the small veins in your hands or arms. °· You need long-term IV medicines, such as antibiotics. °· You need IV nutrition for a long period. °· You need frequent blood draws for lab tests. °· You need dialysis. ° °Implanted ports are usually placed in the chest area, but they can also be placed in the upper arm, the abdomen, or the leg. An implanted port has two main parts: °· Reservoir. The reservoir is round and will appear as a small, raised area under your skin. The reservoir is the part where a needle is inserted to give medicines or draw blood. °· Catheter. The catheter is a thin, flexible tube that extends from the reservoir. The catheter is placed into a large vein. Medicine that is inserted into the reservoir goes into the catheter and then into the vein. ° °How will I care for my incision site? °Do not get the incision site wet. Bathe or shower as directed by your health care provider. °How is my port accessed? °Special steps must be taken to access the port: °· Before the port is accessed, a numbing cream can be placed on the skin. This helps numb the skin over the port site. °· Your health care provider uses a sterile technique to access the port. °? Your health care provider must put on a mask and sterile gloves. °? The skin over your port is cleaned carefully with an antiseptic and allowed to dry. °? The port is gently pinched between sterile gloves, and a needle is inserted into the port. °· Only "non-coring" port needles should be used to access the port. Once the port is accessed, a blood return should be checked. This helps ensure that the port  is in the vein and is not clogged. °· If your port needs to remain accessed for a constant infusion, a clear (transparent) bandage will be placed over the needle site. The bandage and needle will need to be changed every week, or as directed by your health care provider. °· Keep the bandage covering the needle clean and dry. Do not get it wet. Follow your health care provider’s instructions on how to take a shower or bath while the port is accessed. °· If your port does not need to stay accessed, no bandage is needed over the port. ° °What is flushing? °Flushing helps keep the port from getting clogged. Follow your health care provider’s instructions on how and when to flush the port. Ports are usually flushed with saline solution or a medicine called heparin. The need for flushing will depend on how the port is used. °· If the port is used for intermittent medicines or blood draws, the port will need to be flushed: °? After medicines have been given. °? After blood has been drawn. °? As part of routine maintenance. °· If a constant infusion is running, the port may not need to be flushed. ° °How long will my port stay implanted? °The port can stay in for as long as your health care provider thinks it is needed. When it is time for the port to come out, surgery will be   done to remove it. The procedure is similar to the one performed when the port was put in. °When should I seek immediate medical care? °When you have an implanted port, you should seek immediate medical care if: °· You notice a bad smell coming from the incision site. °· You have swelling, redness, or drainage at the incision site. °· You have more swelling or pain at the port site or the surrounding area. °· You have a fever that is not controlled with medicine. ° °This information is not intended to replace advice given to you by your health care provider. Make sure you discuss any questions you have with your health care provider. °Document  Released: 08/27/2005 Document Revised: 02/02/2016 Document Reviewed: 05/04/2013 °Elsevier Interactive Patient Education © 2017 Elsevier Inc[employer] °Moderate Conscious Sedation, Adult, Care After °These instructions provide you with information about caring for yourself after your procedure. Your health care provider may also give you more specific instructions. Your treatment has been planned according to current medical practices, but problems sometimes occur. Call your health care provider if you have any problems or questions after your procedure. °What can I expect after the procedure? °After your procedure, it is common: °· To feel sleepy for several hours. °· To feel clumsy and have poor balance for several hours. °· To have poor judgment for several hours. °· To vomit if you eat too soon. ° °Follow these instructions at home: °For at least 24 hours after the procedure: ° °· Do not: °? Participate in activities where you could fall or become injured. °? Drive. °? Use heavy machinery. °? Drink alcohol. °? Take sleeping pills or medicines that cause drowsiness. °? Make important decisions or sign legal documents. °? Take care of children on your own. °· Rest. °Eating and drinking °· Follow the diet recommended by your health care provider. °· If you vomit: °? Drink water, juice, or soup when you can drink without vomiting. °? Make sure you have little or no nausea before eating solid foods. °General instructions °· Have a responsible adult stay with you until you are awake and alert. °· Take over-the-counter and prescription medicines only as told by your health care provider. °· If you smoke, do not smoke without supervision. °· Keep all follow-up visits as told by your health care provider. This is important. °Contact a health care provider if: °· You keep feeling nauseous or you keep vomiting. °· You feel light-headed. °· You develop a rash. °· You have a fever. °Get help right away if: °· You have trouble  breathing. °This information is not intended to replace advice given to you by your health care provider. Make sure you discuss any questions you have with your health care provider. °Document Released: 06/17/2013 Document Revised: 01/30/2016 Document Reviewed: 12/17/2015 °Elsevier Interactive Patient Education © 2018 Elsevier Inc. ° °

## 2018-04-24 ENCOUNTER — Telehealth: Payer: Self-pay | Admitting: *Deleted

## 2018-04-24 ENCOUNTER — Other Ambulatory Visit: Payer: Self-pay | Admitting: Student in an Organized Health Care Education/Training Program

## 2018-04-24 DIAGNOSIS — C61 Malignant neoplasm of prostate: Secondary | ICD-10-CM

## 2018-04-24 NOTE — Telephone Encounter (Signed)
Patient calling to request a refill on viagra

## 2018-04-30 ENCOUNTER — Inpatient Hospital Stay: Payer: Medicare Other

## 2018-04-30 VITALS — BP 134/86 | HR 75 | Temp 98.6°F | Resp 18 | Ht 69.0 in | Wt 218.8 lb

## 2018-04-30 DIAGNOSIS — Z79899 Other long term (current) drug therapy: Secondary | ICD-10-CM | POA: Diagnosis not present

## 2018-04-30 DIAGNOSIS — C61 Malignant neoplasm of prostate: Secondary | ICD-10-CM

## 2018-04-30 DIAGNOSIS — C7951 Secondary malignant neoplasm of bone: Secondary | ICD-10-CM | POA: Diagnosis not present

## 2018-04-30 DIAGNOSIS — Z7689 Persons encountering health services in other specified circumstances: Secondary | ICD-10-CM | POA: Diagnosis not present

## 2018-04-30 DIAGNOSIS — Z5111 Encounter for antineoplastic chemotherapy: Secondary | ICD-10-CM | POA: Diagnosis not present

## 2018-04-30 DIAGNOSIS — E876 Hypokalemia: Secondary | ICD-10-CM | POA: Diagnosis not present

## 2018-04-30 LAB — CBC WITH DIFFERENTIAL (CANCER CENTER ONLY)
Basophils Absolute: 0 10*3/uL (ref 0.0–0.1)
Basophils Relative: 1 %
Eosinophils Absolute: 0.2 10*3/uL (ref 0.0–0.5)
Eosinophils Relative: 4 %
HCT: 34.5 % — ABNORMAL LOW (ref 38.4–49.9)
Hemoglobin: 11.7 g/dL — ABNORMAL LOW (ref 13.0–17.1)
Lymphocytes Relative: 30 %
Lymphs Abs: 1.7 10*3/uL (ref 0.9–3.3)
MCH: 29.6 pg (ref 27.2–33.4)
MCHC: 33.8 g/dL (ref 32.0–36.0)
MCV: 87.6 fL (ref 79.3–98.0)
Monocytes Absolute: 0.4 10*3/uL (ref 0.1–0.9)
Monocytes Relative: 7 %
Neutro Abs: 3.3 10*3/uL (ref 1.5–6.5)
Neutrophils Relative %: 58 %
Platelet Count: 225 10*3/uL (ref 140–400)
RBC: 3.93 MIL/uL — ABNORMAL LOW (ref 4.20–5.82)
RDW: 15.4 % — ABNORMAL HIGH (ref 11.0–14.6)
WBC Count: 5.6 10*3/uL (ref 4.0–10.3)

## 2018-04-30 LAB — CMP (CANCER CENTER ONLY)
ALT: 18 U/L (ref 0–44)
AST: 13 U/L — ABNORMAL LOW (ref 15–41)
Albumin: 3.7 g/dL (ref 3.5–5.0)
Alkaline Phosphatase: 219 U/L — ABNORMAL HIGH (ref 38–126)
Anion gap: 10 (ref 5–15)
BUN: 13 mg/dL (ref 8–23)
CO2: 25 mmol/L (ref 22–32)
Calcium: 8.7 mg/dL — ABNORMAL LOW (ref 8.9–10.3)
Chloride: 109 mmol/L (ref 98–111)
Creatinine: 0.9 mg/dL (ref 0.61–1.24)
GFR, Est AFR Am: >60 mL/min (ref >60)
GFR, Estimated: >60 mL/min (ref >60)
Glucose, Bld: 131 mg/dL — ABNORMAL HIGH (ref 70–99)
Potassium: 3.4 mmol/L — ABNORMAL LOW (ref 3.5–5.1)
Sodium: 144 mmol/L (ref 135–145)
Total Bilirubin: 0.3 mg/dL (ref 0.3–1.2)
Total Protein: 6.9 g/dL (ref 6.5–8.1)

## 2018-04-30 MED ORDER — SODIUM CHLORIDE 0.9% FLUSH
10.0000 mL | INTRAVENOUS | Status: DC | PRN
  Administered 2018-04-30: 10 mL
  Filled 2018-04-30: qty 10

## 2018-04-30 MED ORDER — HEPARIN SOD (PORK) LOCK FLUSH 100 UNIT/ML IV SOLN
500.0000 [IU] | Freq: Once | INTRAVENOUS | Status: AC | PRN
  Administered 2018-04-30: 500 [IU]
  Filled 2018-04-30: qty 5

## 2018-04-30 MED ORDER — SODIUM CHLORIDE 0.9 % IV SOLN
Freq: Once | INTRAVENOUS | Status: AC
  Administered 2018-04-30: 10:00:00 via INTRAVENOUS
  Filled 2018-04-30: qty 250

## 2018-04-30 MED ORDER — SODIUM CHLORIDE 0.9 % IV SOLN
75.0000 mg/m2 | Freq: Once | INTRAVENOUS | Status: AC
  Administered 2018-04-30: 160 mg via INTRAVENOUS
  Filled 2018-04-30: qty 16

## 2018-04-30 MED ORDER — DEXAMETHASONE SODIUM PHOSPHATE 10 MG/ML IJ SOLN
10.0000 mg | Freq: Once | INTRAMUSCULAR | Status: AC
  Administered 2018-04-30: 10 mg via INTRAVENOUS

## 2018-04-30 MED ORDER — DEXAMETHASONE SODIUM PHOSPHATE 10 MG/ML IJ SOLN
INTRAMUSCULAR | Status: AC
  Filled 2018-04-30: qty 1

## 2018-04-30 NOTE — Patient Instructions (Signed)
[facility] Discharge Instructions for Patients Receiving Chemotherapy  Today you received the following chemotherapy agents Docetaxel (Taxotere).  To help prevent nausea and vomiting after your treatment, we encourage you to take your nausea medication as prescribed.  If you develop nausea and vomiting that is not controlled by your nausea medication, call the clinic.   BELOW ARE SYMPTOMS THAT SHOULD BE REPORTED IMMEDIATELY:  *FEVER GREATER THAN 100.5 F  *CHILLS WITH OR WITHOUT FEVER  NAUSEA AND VOMITING THAT IS NOT CONTROLLED WITH YOUR NAUSEA MEDICATION  *UNUSUAL SHORTNESS OF BREATH  *UNUSUAL BRUISING OR BLEEDING  TENDERNESS IN MOUTH AND THROAT WITH OR WITHOUT PRESENCE OF ULCERS  *URINARY PROBLEMS  *BOWEL PROBLEMS  UNUSUAL RASH Items with * indicate a potential emergency and should be followed up as soon as possible.  Feel free to call the clinic should you have any questions or concerns. The clinic phone number is [phone].  Please show the CHEMO ALERT CARD at check-in to the Emergency Department and triage nurse.  Docetaxel injection What is this medicine? DOCETAXEL (doe se TAX el) is a chemotherapy drug. It targets fast dividing cells, like cancer cells, and causes these cells to die. This medicine is used to treat many types of cancers like breast cancer, certain stomach cancers, head and neck cancer, lung cancer, and prostate cancer. This medicine may be used for other purposes; ask your health care provider or pharmacist if you have questions. COMMON BRAND NAME(S): Docefrez, Taxotere What should I tell my health care provider before I take this medicine? They need to know if you have any of these conditions: -infection (especially a virus infection such as chickenpox, cold sores, or herpes) -liver disease -low blood counts, like low white cell, platelet, or red cell counts -an unusual or allergic reaction to docetaxel, polysorbate 80, other  chemotherapy agents, other medicines, foods, dyes, or preservatives -pregnant or trying to get pregnant -breast-feeding How should I use this medicine? This drug is given as an infusion into a vein. It is administered in a hospital or clinic by a specially trained health care professional. Talk to your pediatrician regarding the use of this medicine in children. Special care may be needed. Overdosage: If you think you have taken too much of this medicine contact a poison control center or emergency room at once. NOTE: This medicine is only for you. Do not share this medicine with others. What if I miss a dose? It is important not to miss your dose. Call your doctor or health care professional if you are unable to keep an appointment. What may interact with this medicine? -cyclosporine -erythromycin -ketoconazole -medicines to increase blood counts like filgrastim, pegfilgrastim, sargramostim -vaccines Talk to your doctor or health care professional before taking any of these medicines: -acetaminophen -aspirin -ibuprofen -ketoprofen -naproxen This list may not describe all possible interactions. Give your health care provider a list of all the medicines, herbs, non-prescription drugs, or dietary supplements you use. Also tell them if you smoke, drink alcohol, or use illegal drugs. Some items may interact with your medicine. What should I watch for while using this medicine? Your condition will be monitored carefully while you are receiving this medicine. You will need important blood work done while you are taking this medicine. This drug may make you feel generally unwell. This is not uncommon, as chemotherapy can affect healthy cells as well as cancer cells. Report any side effects. Continue your course of treatment even though you feel ill unless   ill unless your doctor tells you to stop. In some cases, you may be given additional medicines to help with side effects. Follow all directions for their  use. Call your doctor or health care professional for advice if you get a fever, chills or sore throat, or other symptoms of a cold or flu. Do not treat yourself. This drug decreases your body's ability to fight infections. Try to avoid being around people who are sick. This medicine may increase your risk to bruise or bleed. Call your doctor or health care professional if you notice any unusual bleeding. This medicine may contain alcohol in the product. You may get drowsy or dizzy. Do not drive, use machinery, or do anything that needs mental alertness until you know how this medicine affects you. Do not stand or sit up quickly, especially if you are an older patient. This reduces the risk of dizzy or fainting spells. Avoid alcoholic drinks. Do not become pregnant while taking this medicine. Women should inform their doctor if they wish to become pregnant or think they might be pregnant. There is a potential for serious side effects to an unborn child. Talk to your health care professional or pharmacist for more information. Do not breast-feed an infant while taking this medicine. What side effects may I notice from receiving this medicine? Side effects that you should report to your doctor or health care professional as soon as possible: -allergic reactions like skin rash, itching or hives, swelling of the face, lips, or tongue -low blood counts - This drug may decrease the number of white blood cells, red blood cells and platelets. You may be at increased risk for infections and bleeding. -signs of infection - fever or chills, cough, sore throat, pain or difficulty passing urine -signs of decreased platelets or bleeding - bruising, pinpoint red spots on the skin, black, tarry stools, nosebleeds -signs of decreased red blood cells - unusually weak or tired, fainting spells, lightheadedness -breathing problems -fast or irregular heartbeat -low blood pressure -mouth sores -nausea and vomiting -pain,  swelling, redness or irritation at the injection site -pain, tingling, numbness in the hands or feet -swelling of the ankle, feet, hands -weight gain Side effects that usually do not require medical attention (report to your doctor or health care professional if they continue or are bothersome): -bone pain -complete hair loss including hair on your head, underarms, pubic hair, eyebrows, and eyelashes -diarrhea -excessive tearing -changes in the color of fingernails -loosening of the fingernails -nausea -muscle pain -red flush to skin -sweating -weak or tired This list may not describe all possible side effects. Call your doctor for medical advice about side effects. You may report side effects to FDA at 1-800-FDA-1088. Where should I keep my medicine? This drug is given in a hospital or clinic and will not be stored at home. NOTE: This sheet is a summary. It may not cover all possible information. If you have questions about this medicine, talk to your doctor, pharmacist, or health care provider.  2018 Elsevier/Gold Standard (2015-09-29 12:32:56)

## 2018-05-01 ENCOUNTER — Telehealth: Payer: Self-pay | Admitting: *Deleted

## 2018-05-01 LAB — PROSTATE-SPECIFIC AG, SERUM (LABCORP): Prostate Specific Ag, Serum: 112 ng/mL — ABNORMAL HIGH (ref 0.0–4.0)

## 2018-05-01 NOTE — Telephone Encounter (Signed)
-----   Message from Zola Button, RN sent at 04/30/2018  1:17 PM EDT ----- Regarding: Dr. Alen Blew: First time F/U call Pt received first time Taxotere, tolerated well. Thank you!

## 2018-05-01 NOTE — Telephone Encounter (Signed)
Called patient for first time chemotherapy follow up call. Patient stated,"I'm fine. I had no problems over night. I had no nausea." Instructed him to call [facility] if he had any questions or concerns. He verbalized understanding. Please see the chemotherapy follow up call flowsheet for documentation.

## 2018-05-02 ENCOUNTER — Inpatient Hospital Stay: Payer: Medicare Other

## 2018-05-02 DIAGNOSIS — Z5111 Encounter for antineoplastic chemotherapy: Secondary | ICD-10-CM | POA: Diagnosis not present

## 2018-05-02 DIAGNOSIS — E876 Hypokalemia: Secondary | ICD-10-CM | POA: Diagnosis not present

## 2018-05-02 DIAGNOSIS — C7951 Secondary malignant neoplasm of bone: Secondary | ICD-10-CM | POA: Diagnosis not present

## 2018-05-02 DIAGNOSIS — Z79899 Other long term (current) drug therapy: Secondary | ICD-10-CM | POA: Diagnosis not present

## 2018-05-02 DIAGNOSIS — Z7689 Persons encountering health services in other specified circumstances: Secondary | ICD-10-CM | POA: Diagnosis not present

## 2018-05-02 DIAGNOSIS — C61 Malignant neoplasm of prostate: Secondary | ICD-10-CM | POA: Diagnosis not present

## 2018-05-02 MED ORDER — PEGFILGRASTIM-CBQV 6 MG/0.6ML ~~LOC~~ SOSY
6.0000 mg | PREFILLED_SYRINGE | Freq: Once | SUBCUTANEOUS | Status: AC
  Administered 2018-05-02: 6 mg via SUBCUTANEOUS

## 2018-05-02 NOTE — Patient Instructions (Signed)
Pegfilgrastim injection What is this medicine? PEGFILGRASTIM (PEG fil gra stim) is a long-acting granulocyte colony-stimulating factor that stimulates the growth of neutrophils, a type of white blood cell important in the body's fight against infection. It is used to reduce the incidence of fever and infection in patients with certain types of cancer who are receiving chemotherapy that affects the bone marrow, and to increase survival after being exposed to high doses of radiation. This medicine may be used for other purposes; ask your health care provider or pharmacist if you have questions. COMMON BRAND NAME(S): Neulasta What should I tell my health care provider before I take this medicine? They need to know if you have any of these conditions: -kidney disease -latex allergy -ongoing radiation therapy -sickle cell disease -skin reactions to acrylic adhesives (On-Body Injector only) -an unusual or allergic reaction to pegfilgrastim, filgrastim, other medicines, foods, dyes, or preservatives -pregnant or trying to get pregnant -breast-feeding How should I use this medicine? This medicine is for injection under the skin. If you get this medicine at home, you will be taught how to prepare and give the pre-filled syringe or how to use the On-body Injector. Refer to the patient Instructions for Use for detailed instructions. Use exactly as directed. Tell your healthcare provider immediately if you suspect that the On-body Injector may not have performed as intended or if you suspect the use of the On-body Injector resulted in a missed or partial dose. It is important that you put your used needles and syringes in a special sharps container. Do not put them in a trash can. If you do not have a sharps container, call your pharmacist or healthcare provider to get one. Talk to your pediatrician regarding the use of this medicine in children. While this drug may be prescribed for selected conditions,  precautions do apply. Overdosage: If you think you have taken too much of this medicine contact a poison control center or emergency room at once. NOTE: This medicine is only for you. Do not share this medicine with others. What if I miss a dose? It is important not to miss your dose. Call your doctor or health care professional if you miss your dose. If you miss a dose due to an On-body Injector failure or leakage, a new dose should be administered as soon as possible using a single prefilled syringe for manual use. What may interact with this medicine? Interactions have not been studied. Give your health care provider a list of all the medicines, herbs, non-prescription drugs, or dietary supplements you use. Also tell them if you smoke, drink alcohol, or use illegal drugs. Some items may interact with your medicine. This list may not describe all possible interactions. Give your health care provider a list of all the medicines, herbs, non-prescription drugs, or dietary supplements you use. Also tell them if you smoke, drink alcohol, or use illegal drugs. Some items may interact with your medicine. What should I watch for while using this medicine? You may need blood work done while you are taking this medicine. If you are going to need a MRI, CT scan, or other procedure, tell your doctor that you are using this medicine (On-Body Injector only). What side effects may I notice from receiving this medicine? Side effects that you should report to your doctor or health care professional as soon as possible: -allergic reactions like skin rash, itching or hives, swelling of the face, lips, or tongue -dizziness -fever -pain, redness, or irritation at site   where injected -pinpoint red spots on the skin -red or dark-brown urine -shortness of breath or breathing problems -stomach or side pain, or pain at the shoulder -swelling -tiredness -trouble passing urine or change in the amount of urine Side  effects that usually do not require medical attention (report to your doctor or health care professional if they continue or are bothersome): -bone pain -muscle pain This list may not describe all possible side effects. Call your doctor for medical advice about side effects. You may report side effects to FDA at 1-800-FDA-1088. Where should I keep my medicine? Keep out of the reach of children. Store pre-filled syringes in a refrigerator between 2 and 8 degrees C (36 and 46 degrees F). Do not freeze. Keep in carton to protect from light. Throw away this medicine if it is left out of the refrigerator for more than 48 hours. Throw away any unused medicine after the expiration date. NOTE: This sheet is a summary. It may not cover all possible information. If you have questions about this medicine, talk to your doctor, pharmacist, or health care provider.  2018 Elsevier/Gold Standard (2016-08-23 12:58:03)  

## 2018-05-15 ENCOUNTER — Other Ambulatory Visit: Payer: Self-pay

## 2018-05-15 ENCOUNTER — Encounter: Payer: Self-pay | Admitting: Student in an Organized Health Care Education/Training Program

## 2018-05-15 ENCOUNTER — Ambulatory Visit (INDEPENDENT_AMBULATORY_CARE_PROVIDER_SITE_OTHER): Payer: Medicare Other | Admitting: Student in an Organized Health Care Education/Training Program

## 2018-05-15 VITALS — BP 136/72 | HR 74 | Temp 98.5°F | Ht 69.0 in | Wt 216.2 lb

## 2018-05-15 DIAGNOSIS — N529 Male erectile dysfunction, unspecified: Secondary | ICD-10-CM

## 2018-05-15 DIAGNOSIS — C61 Malignant neoplasm of prostate: Secondary | ICD-10-CM | POA: Diagnosis not present

## 2018-05-15 MED ORDER — SILDENAFIL CITRATE 100 MG PO TABS: 100.0000 mg | ORAL_TABLET | Freq: Every day | ORAL | 3 refills | Status: DC | PRN

## 2018-05-15 NOTE — Progress Notes (Signed)
   Subjective:    Dennis Fischer - 72 y.o. male MRN [id number]  Date of birth: [birth year]  HPI  Dennis Fischer is here for erectile dysfunction.  Patient reports that he was previously diagnosed with erectile dysfunction and this was managed with sildenafil 100 mg which he tolerated well. He reports that he would like a refill of this medication. He does not have any notable cardiac history. He does not report flushing or blurred vision at that dose. He was able to obtain erection at that dose.   Health Maintenance:  Health Maintenance Due  Topic Date Due  . Hepatitis C Screening  1946/02/10  . COLONOSCOPY  08/05/1996    -  reports that he has quit smoking. He has never used smokeless tobacco. - Review of Systems: Per HPI. - Past Medical History: Patient Active Problem List   Diagnosis Date Noted  . Goals of care, counseling/discussion 01/20/2018  . Right knee pain 01/17/2018  . Prostate cancer ([place]) 01/22/2017   - Medications: reviewed and updated Current Outpatient Medications  Medication Sig Dispense Refill  . Acetaminophen (TYLENOL PO) Take 500 mg by mouth daily as needed.    Marland Kitchen amLODipine (NORVASC) 10 MG tablet Take 1 tablet (10 mg total) by mouth daily. 90 tablet 3  . aspirin 81 MG tablet Take 81 mg by mouth daily.    . calcium-vitamin D (OSCAL WITH D) 500-200 MG-UNIT tablet Take 1 tablet by mouth 2 (two) times daily. 60 tablet 3  . carvedilol (COREG) 12.5 MG tablet TAKE 1 TABLET BY MOUTH TWICE DAILY WITH A MEAL 180 tablet 3  . ergocalciferol (VITAMIN D2) 50000 units capsule Take 50,000 Units by mouth once a week.    . lidocaine-prilocaine (EMLA) cream Apply 1 application topically as needed. 30 g 0  . Multiple Vitamin (MULTIVITAMIN) tablet Take 1 tablet by mouth daily.    Marland Kitchen omeprazole (PRILOSEC) 20 MG capsule Take 20 mg by mouth daily.    . prochlorperazine (COMPAZINE) 10 MG tablet TAKE 1 TABLET(10 MG) BY MOUTH EVERY 6 HOURS AS NEEDED FOR NAUSEA OR VOMITING 385 tablet 0    . sildenafil (VIAGRA) 100 MG tablet Take 1 tablet (100 mg total) by mouth daily as needed for erectile dysfunction. 30 tablet 3  . simvastatin (ZOCOR) 20 MG tablet Take 20 mg by mouth daily.     No current facility-administered medications for this visit.     Review of Systems See HPI     Objective:   Physical Exam BP 136/72   Pulse 74   Temp 98.5 F (36.9 C) (Oral)   Ht 5\' 9"  (1.753 m)   Wt 216 lb 3.2 oz (98.1 kg)   SpO2 97%   BMI 31.93 kg/m  Gen: NAD, alert, cooperative with exam, well-appearing  HEENT: NCAT CV: Regular rate noted Resp: comfortable work of breathing, non-labored Abd: Soft and non-distended Skin: no rashes Neuro: no gross deficits.  Psych: good insight, alert and oriented     Assessment & Plan:   Erectile dysfunction Restart previous dose of viagra Common and serious side effects discussed Return precautions/reasons to call provided Otherwise return as needed  Meds ordered this encounter  Medications  . sildenafil (VIAGRA) 100 MG tablet    Sig: Take 1 tablet (100 mg total) by mouth daily as needed for erectile dysfunction.    Dispense:  30 tablet    Refill:  3    [name], MD,MS,  PGY3 05/15/2018 11:28 AM

## 2018-05-15 NOTE — Patient Instructions (Signed)
It was a pleasure seeing you today in our clinic.   Our clinic's number is [phone]. Please call with questions or concerns about what we discussed today.  Be well, Dr. Vardaan Depascale   

## 2018-05-17 ENCOUNTER — Encounter (HOSPITAL_COMMUNITY): Payer: Self-pay | Admitting: Emergency Medicine

## 2018-05-17 ENCOUNTER — Emergency Department (HOSPITAL_COMMUNITY): Payer: Medicare Other

## 2018-05-17 ENCOUNTER — Emergency Department (HOSPITAL_COMMUNITY)
Admission: EM | Admit: 2018-05-17 | Discharge: 2018-05-17 | Disposition: A | Discharge status: Home / Self Care | Payer: Medicare Other | Attending: Emergency Medicine | Admitting: Emergency Medicine

## 2018-05-17 DIAGNOSIS — Z79899 Other long term (current) drug therapy: Secondary | ICD-10-CM | POA: Insufficient documentation

## 2018-05-17 DIAGNOSIS — M25511 Pain in right shoulder: Secondary | ICD-10-CM | POA: Diagnosis present

## 2018-05-17 DIAGNOSIS — C799 Secondary malignant neoplasm of unspecified site: Secondary | ICD-10-CM | POA: Diagnosis not present

## 2018-05-17 DIAGNOSIS — J9811 Atelectasis: Secondary | ICD-10-CM | POA: Diagnosis not present

## 2018-05-17 DIAGNOSIS — Z8546 Personal history of malignant neoplasm of prostate: Secondary | ICD-10-CM | POA: Insufficient documentation

## 2018-05-17 DIAGNOSIS — I10 Essential (primary) hypertension: Secondary | ICD-10-CM | POA: Diagnosis not present

## 2018-05-17 DIAGNOSIS — Z87891 Personal history of nicotine dependence: Secondary | ICD-10-CM | POA: Insufficient documentation

## 2018-05-17 DIAGNOSIS — C61 Malignant neoplasm of prostate: Secondary | ICD-10-CM | POA: Diagnosis not present

## 2018-05-17 DIAGNOSIS — M19011 Primary osteoarthritis, right shoulder: Secondary | ICD-10-CM | POA: Diagnosis not present

## 2018-05-17 DIAGNOSIS — C7951 Secondary malignant neoplasm of bone: Secondary | ICD-10-CM | POA: Diagnosis not present

## 2018-05-17 DIAGNOSIS — Z7982 Long term (current) use of aspirin: Secondary | ICD-10-CM | POA: Diagnosis not present

## 2018-05-17 IMAGING — CR DG CHEST 2V
2 series · 2 of 2 positions shown · non-contrast
Comparison: [DATE].

CLINICAL DATA: Right shoulder and arm pain. History of prostate
cancer and a porta catheter.

EXAM:
CHEST - 2 VIEW

[w chest pa]
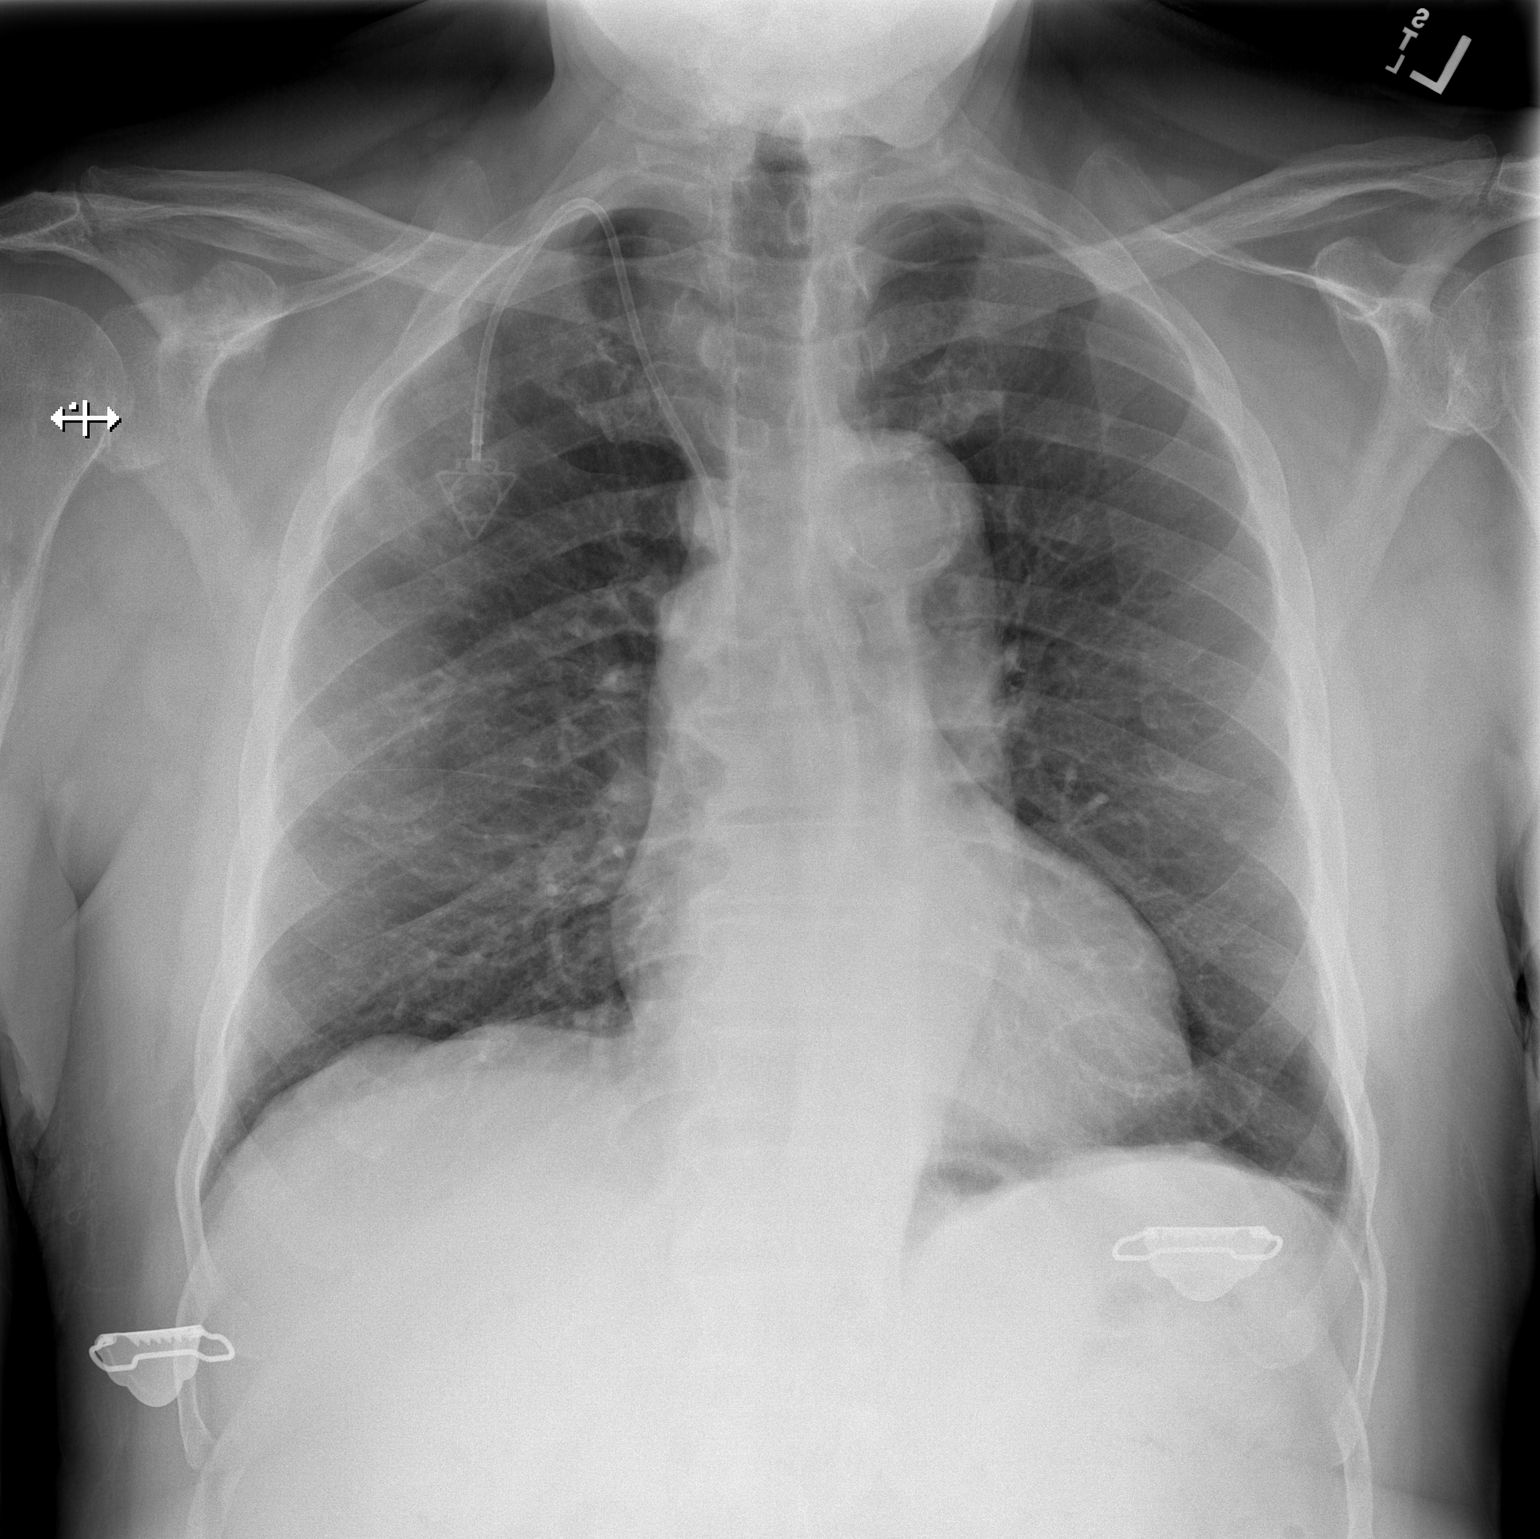

[w chest lat]
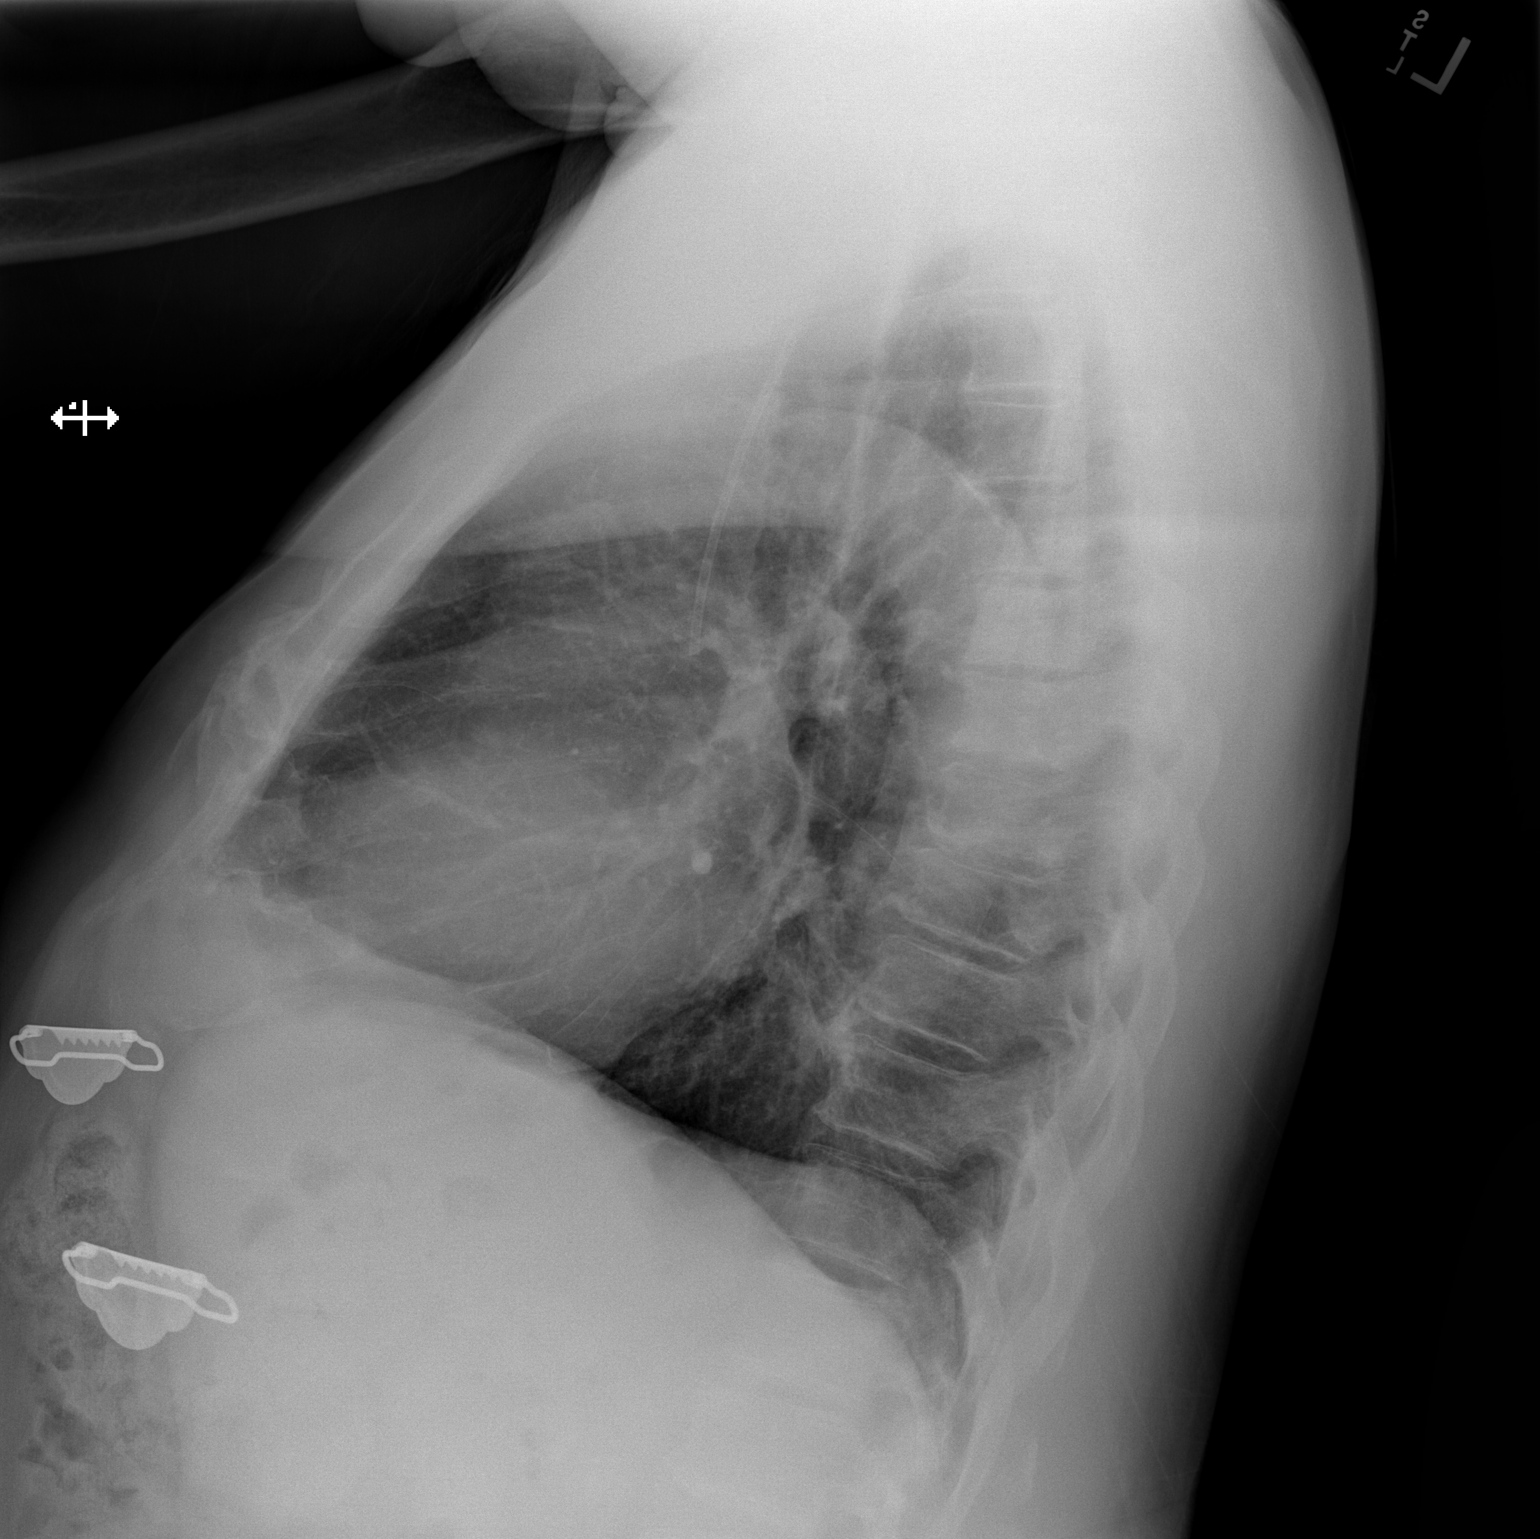

[2 of 2 positions shown; findings below may reference images not displayed]

FINDINGS: Normal sized heart. Tortuous and calcified thoracic aorta. Interval
small amount of linear atelectasis or scarring at the left lung
base. Interval right jugular porta catheter with its tip in the
superior vena cava. Thoracic spine degenerative changes.
IMPRESSION: Interval small amount of linear atelectasis or scarring at the left
lung base. Otherwise, unremarkable examination.

## 2018-05-17 IMAGING — CR DG SHOULDER 2+V*R*
3 series · 3 of 3 positions shown · non-contrast
Comparison: None.

CLINICAL DATA: Right shoulder pain radiating to the right upper
extremity for 1 week. No reported injury. History of prostate
cancer.

EXAM:
RIGHT SHOULDER - 2+ VIEW

[w shoulder external right]
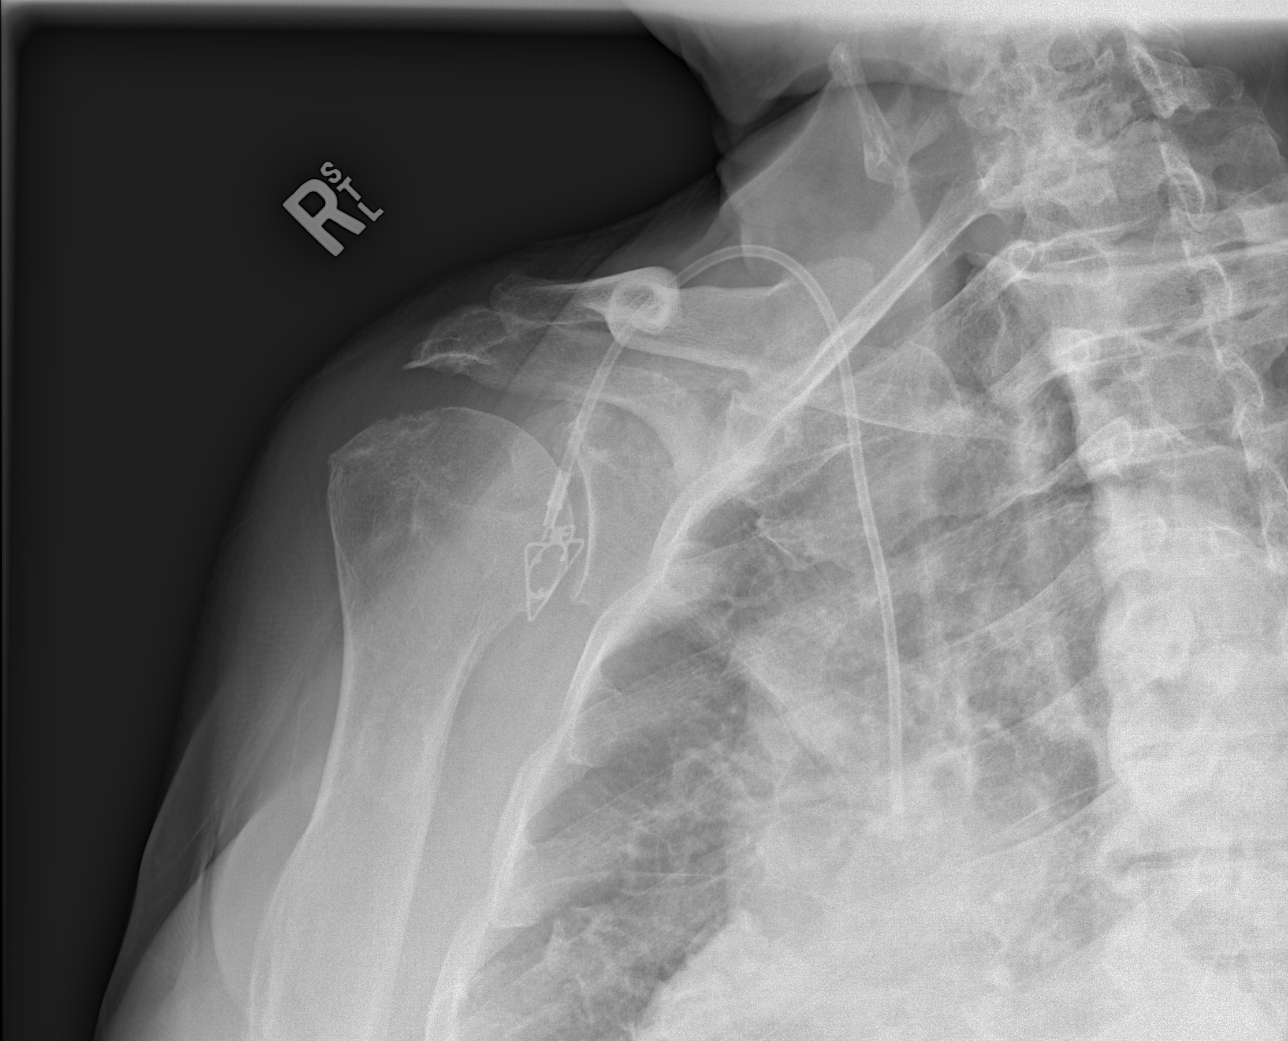

[w shoulder y-view right]
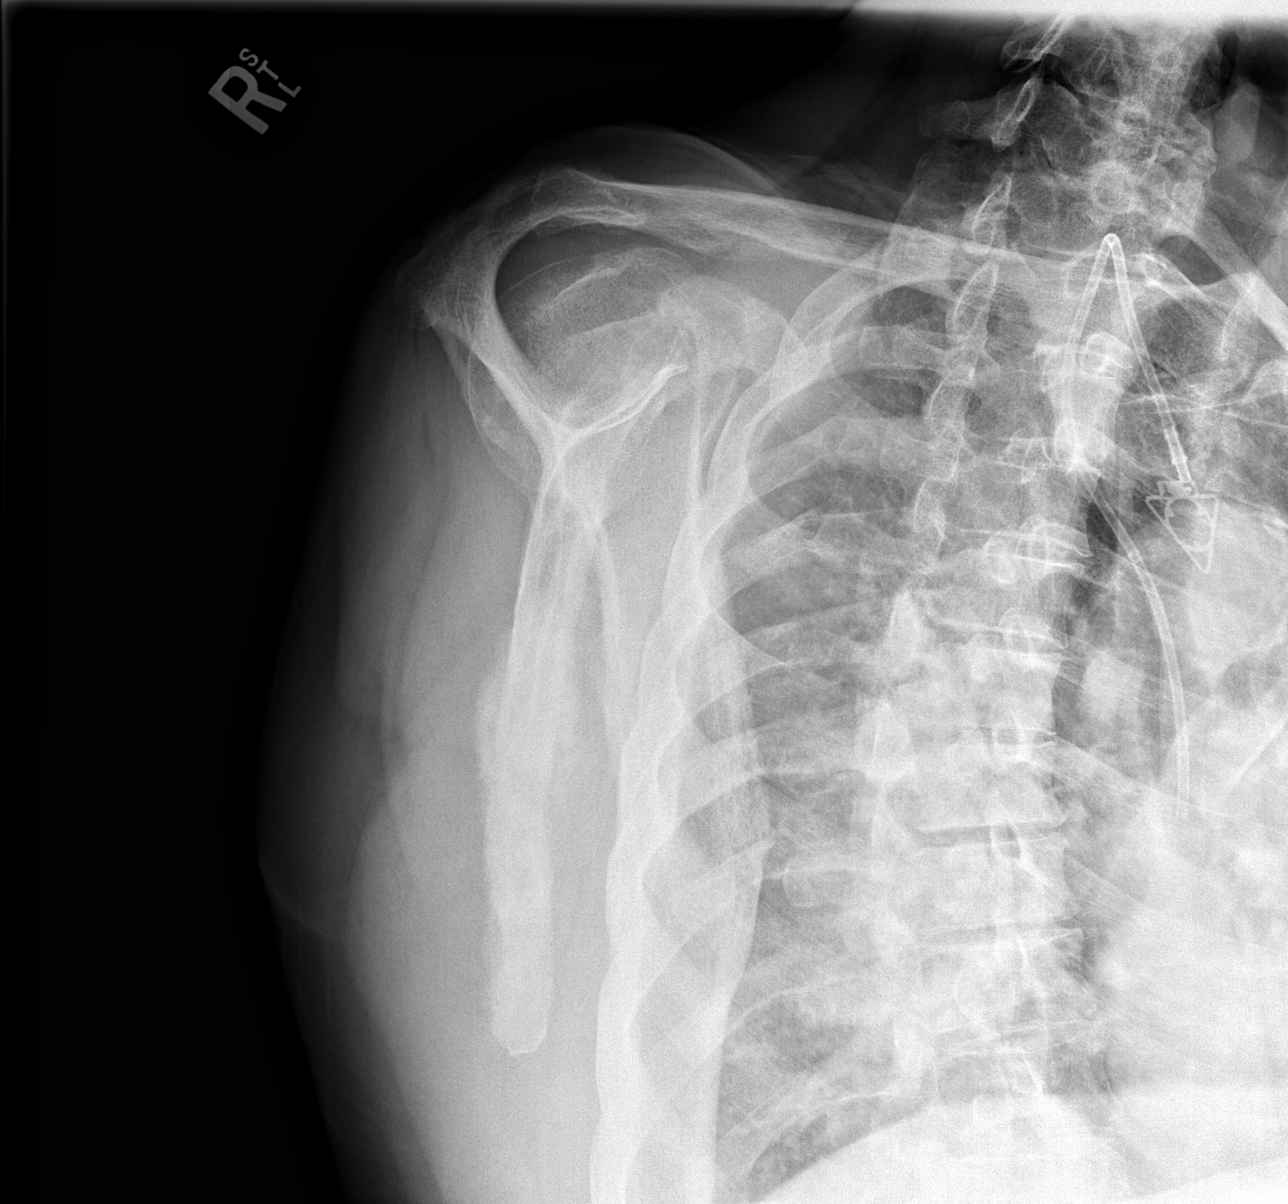

[x shoulder axillary right]
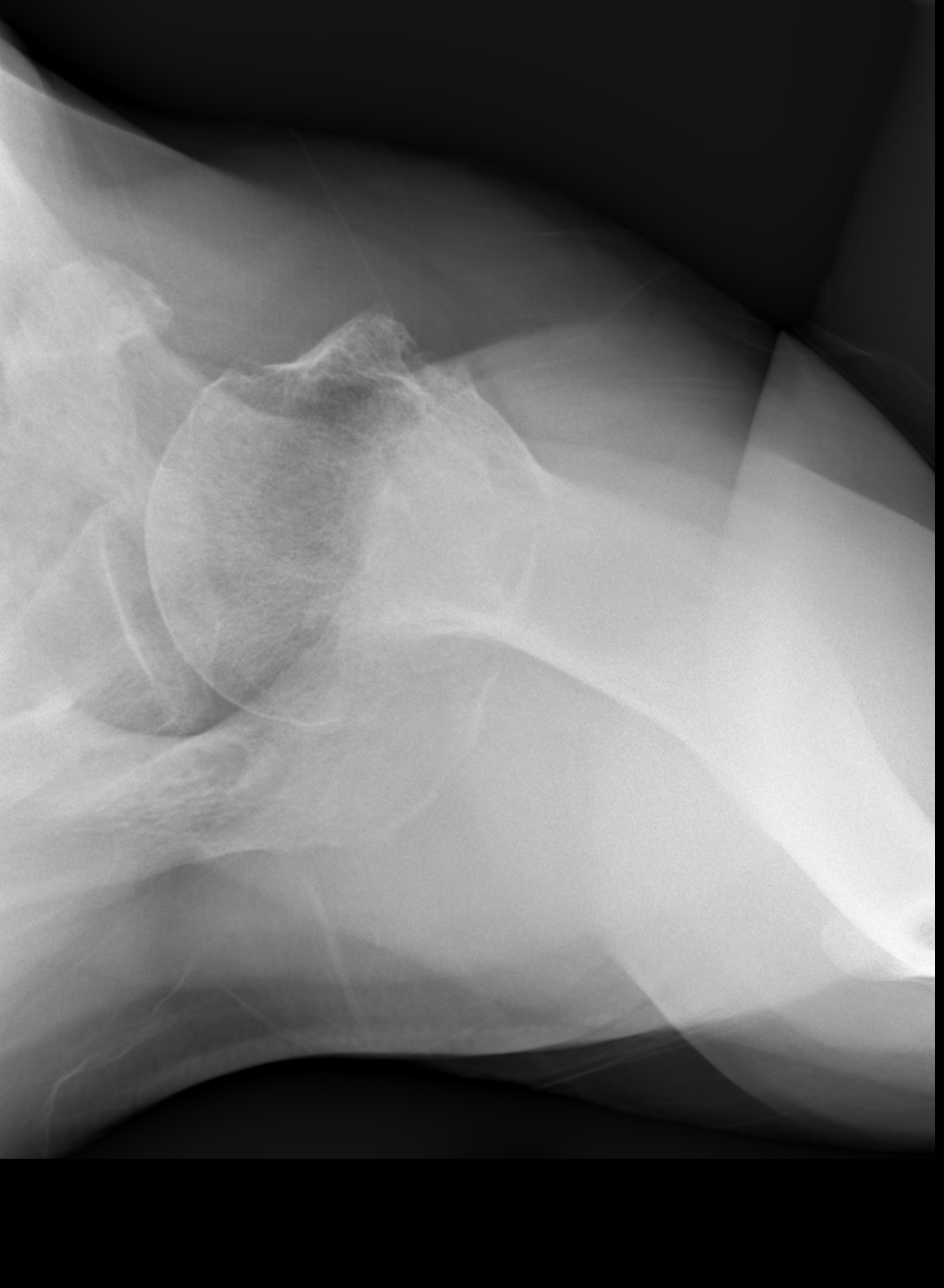

[3 of 3 positions shown; findings below may reference images not displayed]

FINDINGS: Expansile sclerotic lesion in the body of the right scapula. Healed
deformity in the right clavicular shaft. No acute fracture. No
evidence of right acromioclavicular separation. No right
glenohumeral dislocation. Moderate acromioclavicular osteoarthritis.
Right internal jugular MediPort is noted with the tip in lower third
of the SVC.
IMPRESSION: Expansile sclerotic metastasis in the body of the right scapula. No
fracture or malalignment in the right shoulder. Moderate right AC
joint osteoarthritis.

## 2018-05-17 NOTE — Discharge Instructions (Addendum)
For your shoulder blade (scapula), follow up with Dr. Alen Blew to discuss if you need radiation. For your osteoarthritis of your joint, follow up with orthopedic doctor.  Return to ER if you have fever, swelling, redness, or numbness of your arm.

## 2018-05-17 NOTE — ED Triage Notes (Addendum)
Patient here from home with complaints of right shoulder pain radiating down into right arm x1 week. States that he has a right chest port and "hopes that it is not infected". Hx of cancer.

## 2018-05-17 NOTE — ED Notes (Signed)
Bed: WA02 Expected date:  Expected time:  Means of arrival:  Comments: 

## 2018-05-17 NOTE — ED Provider Notes (Signed)
[facility] DEPT Provider Note   CSN: [id number] Arrival date & time: 05/17/18  1511     History   Chief Complaint Chief Complaint  Patient presents with  . Shoulder Pain  . Arm Pain    HPI Dennis Fischer is a 72 y.o. male.  72 year old male with past mental history including metastatic prostate cancer, hypertension who presents with right shoulder pain.  The patient reports 1 week of right shoulder pain that was initially mild but became more severe after he tried to hug his daughter.  He denies any trauma or previous injury to his shoulder.  Pain is worse when he tries to raise his arm above his head.  He denies any weakness or numbness of his hand.  He has not noticed any swelling of his arm.  He denies any chest pain, breathing problems, fevers, cough, or recent illness.  The history is provided by the patient.  Shoulder Pain   Pertinent negatives include no numbness.  Arm Pain  Pertinent negatives include no chest pain and no shortness of breath.    Past Medical History:  Diagnosis Date  . Cancer [facility])    Prostate with chemotherapy  . Hypertension     Patient Active Problem List   Diagnosis Date Noted  . Goals of care, counseling/discussion 01/20/2018  . Right knee pain 01/17/2018  . Prostate cancer ([place]) 01/22/2017    Past Surgical History:  Procedure Laterality Date  . HIP SURGERY Bilateral   . IR IMAGING GUIDED PORT INSERTION  04/22/2018        Home Medications    Prior to Admission medications   Medication Sig Start Date End Date Taking? Authorizing Provider  Acetaminophen (TYLENOL PO) Take 500 mg by mouth daily as needed. 07/10/16   [provider]  amLODipine (NORVASC) 10 MG tablet Take 1 tablet (10 mg total) by mouth daily. 03/07/18   Steve Rattler, DO  aspirin 81 MG tablet Take 81 mg by mouth daily. 07/10/16   [provider]  calcium-vitamin D (OSCAL WITH D) 500-200 MG-UNIT tablet Take 1 tablet by  mouth 2 (two) times daily. 04/15/18   Wyatt Portela, MD  carvedilol (COREG) 12.5 MG tablet TAKE 1 TABLET BY MOUTH TWICE DAILY WITH A MEAL 04/25/18   [name], MD  ergocalciferol (VITAMIN D2) 50000 units capsule Take 50,000 Units by mouth once a week. 07/10/16   [provider]  lidocaine-prilocaine (EMLA) cream Apply 1 application topically as needed. 04/15/18   Wyatt Portela, MD  Multiple Vitamin (MULTIVITAMIN) tablet Take 1 tablet by mouth daily. 07/10/16   [provider]  omeprazole (PRILOSEC) 20 MG capsule Take 20 mg by mouth daily. 07/10/16   [provider]  prochlorperazine (COMPAZINE) 10 MG tablet TAKE 1 TABLET(10 MG) BY MOUTH EVERY 6 HOURS AS NEEDED FOR NAUSEA OR VOMITING 04/15/18   Wyatt Portela, MD  sildenafil (VIAGRA) 100 MG tablet Take 1 tablet (100 mg total) by mouth daily as needed for erectile dysfunction. 05/15/18   [name], MD  simvastatin (ZOCOR) 20 MG tablet Take 20 mg by mouth daily. 07/10/16   [provider]    Family History No family history on file.  Social History Social History   Tobacco Use  . Smoking status: Former Research scientist (life sciences)  . Smokeless tobacco: Never Used  Substance Use Topics  . Alcohol use: Yes    Comment: Once every 2 weeks.   . Drug use: Yes    Types:  Marijuana    Comment: Medical      Allergies   Lisinopril; Mango flavor; and Peanut-containing drug products   Review of Systems Review of Systems  Constitutional: Negative for fever.  Respiratory: Negative for shortness of breath.   Cardiovascular: Negative for chest pain.  Musculoskeletal: Positive for arthralgias. Negative for joint swelling.  Skin: Negative for color change.  Neurological: Negative for weakness and numbness.     Physical Exam Updated Vital Signs BP (!) 154/91 (BP Location: Left Arm)   Pulse 92   Temp 99.3 F (37.4 C) (Oral)   Resp 18   SpO2 99%   Physical Exam  Constitutional: He is oriented to person, place, and time.  He appears well-developed and well-nourished. No distress.  HENT:  Head: Normocephalic and atraumatic.  Eyes: Conjunctivae are normal.  Neck: Neck supple.  Cardiovascular: Intact distal pulses.  Pulmonary/Chest:  Port in right upper chest with no surrounding erythema or tenderness  Musculoskeletal: He exhibits no edema, tenderness or deformity.  Pain with abduction of right shoulder beyond 90 degrees, no focal areas of shoulder tenderness  Neurological: He is alert and oriented to person, place, and time. No sensory deficit.  Normal grip strength  Skin: Skin is warm and dry.  Psychiatric: He has a normal mood and affect. Judgment normal.  Nursing note and vitals reviewed.    ED Treatments / Results  Labs (all labs ordered are listed, but only abnormal results are displayed) Labs Reviewed - No data to display  EKG None  Radiology Dg Chest 2 View  Result Date: 05/17/2018 CLINICAL DATA:  Right shoulder and arm pain. History of prostate cancer and a porta catheter. EXAM: CHEST - 2 VIEW COMPARISON:  09/04/2017. FINDINGS: Normal sized heart. Tortuous and calcified thoracic aorta. Interval small amount of linear atelectasis or scarring at the left lung base. Interval right jugular porta catheter with its tip in the superior vena cava. Thoracic spine degenerative changes. IMPRESSION: Interval small amount of linear atelectasis or scarring at the left lung base. Otherwise, unremarkable examination. Electronically Signed   By: Claudie Revering M.D.   On: 05/17/2018 17:19   Dg Shoulder Right  Result Date: 05/17/2018 CLINICAL DATA:  Right shoulder pain radiating to the right upper extremity for 1 week. No reported injury. History of prostate cancer. EXAM: RIGHT SHOULDER - 2+ VIEW COMPARISON:  None. FINDINGS: Expansile sclerotic lesion in the body of the right scapula. Healed deformity in the right clavicular shaft. No acute fracture. No evidence of right acromioclavicular separation. No right  glenohumeral dislocation. Moderate acromioclavicular osteoarthritis. Right internal jugular MediPort is noted with the tip in lower third of the SVC. 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 No fracture or malalignment in the right shoulder. Moderate right AC joint osteoarthritis. Electronically Signed   By: Ilona Sorrel M.D.   On: 05/17/2018 17:18    Procedures Procedures (including critical care time)  Medications Ordered in ED Medications - No data to display   Initial Impression / Assessment and Plan / ED Course  I have reviewed the triage vital signs and the nursing notes.  Pertinent imaging results that were available during my care of the patient were reviewed by me and considered in my medical decision making (see chart for details).     Well-appearing on exam.  No signs of swelling, warmth, or erythema down the arm.  I do not suspect upper extremity DVT, cellulitis, or other infectious process of the upper extremity.  No swelling of joints to suggest septic arthritis.  Plain films show bony metastases and right scapula as well as osteoarthritis changes of right AC joint.  Both of these issues may be contributing to the patient's pain.  I have recommended that he follow-up with orthopedics regarding his osteoarthritis and that he contact his oncologist to discuss whether he would benefit from radiation therapy to his scapula.  I extensively reviewed return precautions with the patient his wife regarding any signs of infection or neurologic compromise.  They voiced understanding.  Final Clinical Impressions(s) / ED Diagnoses   Final diagnoses:  Osteoarthritis of right AC (acromioclavicular) joint  Multiple lesions of metastatic malignancy ([place])  Right shoulder pain, unspecified chronicity    ED Discharge Orders    None       Cassidie Veiga, Wenda Overland, MD 05/17/18 2108

## 2018-05-21 ENCOUNTER — Telehealth: Payer: Self-pay | Admitting: Oncology

## 2018-05-21 ENCOUNTER — Inpatient Hospital Stay (HOSPITAL_BASED_OUTPATIENT_CLINIC_OR_DEPARTMENT_OTHER): Payer: Medicare Other | Admitting: Oncology

## 2018-05-21 ENCOUNTER — Inpatient Hospital Stay: Payer: Medicare Other | Attending: Oncology

## 2018-05-21 ENCOUNTER — Inpatient Hospital Stay: Payer: Medicare Other

## 2018-05-21 VITALS — BP 128/79 | HR 78 | Temp 98.4°F | Resp 17 | Ht 69.0 in | Wt 216.1 lb

## 2018-05-21 DIAGNOSIS — I10 Essential (primary) hypertension: Secondary | ICD-10-CM | POA: Diagnosis not present

## 2018-05-21 DIAGNOSIS — C7951 Secondary malignant neoplasm of bone: Secondary | ICD-10-CM | POA: Diagnosis not present

## 2018-05-21 DIAGNOSIS — Z5111 Encounter for antineoplastic chemotherapy: Secondary | ICD-10-CM | POA: Diagnosis not present

## 2018-05-21 DIAGNOSIS — Z79899 Other long term (current) drug therapy: Secondary | ICD-10-CM | POA: Insufficient documentation

## 2018-05-21 DIAGNOSIS — C61 Malignant neoplasm of prostate: Secondary | ICD-10-CM

## 2018-05-21 DIAGNOSIS — Z95828 Presence of other vascular implants and grafts: Secondary | ICD-10-CM

## 2018-05-21 LAB — CMP (CANCER CENTER ONLY)
ALT: 15 U/L (ref 0–44)
AST: 13 U/L — ABNORMAL LOW (ref 15–41)
Albumin: 3.6 g/dL (ref 3.5–5.0)
Alkaline Phosphatase: 194 U/L — ABNORMAL HIGH (ref 38–126)
Anion gap: 9 (ref 5–15)
BUN: 15 mg/dL (ref 8–23)
CO2: 23 mmol/L (ref 22–32)
Calcium: 9 mg/dL (ref 8.9–10.3)
Chloride: 109 mmol/L (ref 98–111)
Creatinine: 0.99 mg/dL (ref 0.61–1.24)
GFR, Est AFR Am: >60 mL/min (ref >60)
GFR, Estimated: >60 mL/min (ref >60)
Glucose, Bld: 122 mg/dL — ABNORMAL HIGH (ref 70–99)
Potassium: 3.8 mmol/L (ref 3.5–5.1)
Sodium: 141 mmol/L (ref 135–145)
Total Bilirubin: 0.3 mg/dL (ref 0.3–1.2)
Total Protein: 7.1 g/dL (ref 6.5–8.1)

## 2018-05-21 LAB — CBC WITH DIFFERENTIAL (CANCER CENTER ONLY)
Basophils Absolute: 0.1 10*3/uL (ref 0.0–0.1)
Basophils Relative: 1 %
Eosinophils Absolute: 0.1 10*3/uL (ref 0.0–0.5)
Eosinophils Relative: 2 %
HCT: 32.2 % — ABNORMAL LOW (ref 38.4–49.9)
Hemoglobin: 10.7 g/dL — ABNORMAL LOW (ref 13.0–17.1)
Lymphocytes Relative: 27 %
Lymphs Abs: 1.6 10*3/uL (ref 0.9–3.3)
MCH: 29.2 pg (ref 27.2–33.4)
MCHC: 33.2 g/dL (ref 32.0–36.0)
MCV: 87.9 fL (ref 79.3–98.0)
Monocytes Absolute: 0.5 10*3/uL (ref 0.1–0.9)
Monocytes Relative: 9 %
Neutro Abs: 3.6 10*3/uL (ref 1.5–6.5)
Neutrophils Relative %: 61 %
Platelet Count: 472 10*3/uL — ABNORMAL HIGH (ref 140–400)
RBC: 3.67 MIL/uL — ABNORMAL LOW (ref 4.20–5.82)
RDW: 15.6 % — ABNORMAL HIGH (ref 11.0–14.6)
WBC Count: 6 10*3/uL (ref 4.0–10.3)

## 2018-05-21 MED ORDER — SODIUM CHLORIDE 0.9% FLUSH
10.0000 mL | INTRAVENOUS | Status: DC | PRN
  Administered 2018-05-21: 10 mL
  Filled 2018-05-21: qty 10

## 2018-05-21 MED ORDER — DEXAMETHASONE SODIUM PHOSPHATE 10 MG/ML IJ SOLN
INTRAMUSCULAR | Status: AC
  Filled 2018-05-21: qty 1

## 2018-05-21 MED ORDER — SODIUM CHLORIDE 0.9 % IV SOLN
75.0000 mg/m2 | Freq: Once | INTRAVENOUS | Status: AC
  Administered 2018-05-21: 160 mg via INTRAVENOUS
  Filled 2018-05-21: qty 16

## 2018-05-21 MED ORDER — HEPARIN SOD (PORK) LOCK FLUSH 100 UNIT/ML IV SOLN
500.0000 [IU] | Freq: Once | INTRAVENOUS | Status: AC | PRN
  Administered 2018-05-21: 500 [IU]
  Filled 2018-05-21: qty 5

## 2018-05-21 MED ORDER — SODIUM CHLORIDE 0.9 % IV SOLN
Freq: Once | INTRAVENOUS | Status: AC
  Administered 2018-05-21: 14:00:00 via INTRAVENOUS
  Filled 2018-05-21: qty 250

## 2018-05-21 MED ORDER — SODIUM CHLORIDE 0.9% FLUSH
10.0000 mL | INTRAVENOUS | Status: DC | PRN
  Administered 2018-05-21: 10 mL via INTRAVENOUS
  Filled 2018-05-21: qty 10

## 2018-05-21 MED ORDER — DEXAMETHASONE SODIUM PHOSPHATE 10 MG/ML IJ SOLN
10.0000 mg | Freq: Once | INTRAMUSCULAR | Status: AC
  Administered 2018-05-21: 10 mg via INTRAVENOUS

## 2018-05-21 NOTE — Telephone Encounter (Signed)
Gave pt avs and calendar  °

## 2018-05-21 NOTE — Patient Instructions (Signed)
[facility] Discharge Instructions for Patients Receiving Chemotherapy  Today you received the following chemotherapy agents: Docetaxel (Taxotere).  To help prevent nausea and vomiting after your treatment, we encourage you to take your nausea medication as prescribed.  If you develop nausea and vomiting that is not controlled by your nausea medication, call the clinic.   BELOW ARE SYMPTOMS THAT SHOULD BE REPORTED IMMEDIATELY:  *FEVER GREATER THAN 100.5 F  *CHILLS WITH OR WITHOUT FEVER  NAUSEA AND VOMITING THAT IS NOT CONTROLLED WITH YOUR NAUSEA MEDICATION  *UNUSUAL SHORTNESS OF BREATH  *UNUSUAL BRUISING OR BLEEDING  TENDERNESS IN MOUTH AND THROAT WITH OR WITHOUT PRESENCE OF ULCERS  *URINARY PROBLEMS  *BOWEL PROBLEMS  UNUSUAL RASH Items with * indicate a potential emergency and should be followed up as soon as possible.  Feel free to call the clinic should you have any questions or concerns. The clinic phone number is [phone].  Please show the CHEMO ALERT CARD at check-in to the Emergency Department and triage nurse.   

## 2018-05-21 NOTE — Progress Notes (Signed)
Hematology and Oncology Follow Up Visit  Dennis Fischer [id number] 14-Oct-1945 72 y.o. 05/21/2018 12:24 PM Dennis Fischer, MDFeng, [name], MD   Principle Diagnosis: 72 year old man with castration-resistant prostate cancer documented in 2017.  Original Gleason score of 7 and a PSA of 12.8 diagnosed in 2016.  Prior Therapy: He received definitive therapy using radiation therapy and androgen deprivation in 2016.  He developed advanced disease with pelvic adenopathy and was treated with androgen deprivation while was living in [place].  He subsequently developed castration resistant disease.  Zytiga 1000 mg daily restarted on 03/14/2017.  Therapy discontinued in August 2018 due to progression of disease.  Current therapy:   Lupron 30 mg every 4 months.    Taxotere chemotherapy at 75 mg/m given every 3 weeks started on the first 2019.  Is here for cycle 2 of therapy.   Interim History:  [name] is here for a follow-up visit.  Since the last visit, he received the first cycle of chemotherapy without any major complications.  He did report grade 1 nausea and grade 1 fatigue that has resolved spontaneously.  He denies any vomiting or infusion related complications.  He denies any excessive fatigue or neuropathy.  He continues to be active and attends to activities of daily living.  He did develop a shoulder pain and was seen in the emergency department after trying to lift child and felt a pop in his shoulder.  Imaging studies did not show any fractures or dislocation.  His pain is right shoulder is much improved and has full range of motion.  He does not report any headaches, blurry vision, syncope or seizures.  He denies any easiness or alteration in mental status.  He does not report any fevers, chills or sweats.  He does not report any cough, wheezing or hemoptysis. He does not report any chest pain, palpitation, orthopnea.  Denies any nausea, vomiting or abdominal pain.  He denies any changes  in bowel habits.  He does not report any frequency urgency or hesitancy.  He does not report any easy bruising or rashes.  He does not report any lymphadenopathy.  He denies any changes in his mood.  He denies any worsening bone pain or fractures.  Remaining review of systems is negative.   Medications: I have reviewed the patient's current medications.  Current Outpatient Medications  Medication Sig Dispense Refill  . Acetaminophen (TYLENOL PO) Take 500 mg by mouth daily as needed.    Marland Kitchen amLODipine (NORVASC) 10 MG tablet Take 1 tablet (10 mg total) by mouth daily. 90 tablet 3  . aspirin 81 MG tablet Take 81 mg by mouth daily.    . calcium-vitamin D (OSCAL WITH D) 500-200 MG-UNIT tablet Take 1 tablet by mouth 2 (two) times daily. 60 tablet 3  . carvedilol (COREG) 12.5 MG tablet TAKE 1 TABLET BY MOUTH TWICE DAILY WITH A MEAL 180 tablet 3  . lidocaine-prilocaine (EMLA) cream Apply 1 application topically as needed. 30 g 0  . Multiple Vitamin (MULTIVITAMIN) tablet Take 1 tablet by mouth daily.    Marland Kitchen omeprazole (PRILOSEC) 20 MG capsule Take 20 mg by mouth daily.    . prochlorperazine (COMPAZINE) 10 MG tablet TAKE 1 TABLET(10 MG) BY MOUTH EVERY 6 HOURS AS NEEDED FOR NAUSEA OR VOMITING 385 tablet 0  . sildenafil (VIAGRA) 100 MG tablet Take 1 tablet (100 mg total) by mouth daily as needed for erectile dysfunction. 30 tablet 3  . simvastatin (ZOCOR) 20 MG tablet Take 20 mg by  mouth daily.     No current facility-administered medications for this visit.      Allergies:  Allergies  Allergen Reactions  . Lisinopril Swelling    Facial swelling  . Mango Flavor   . Peanut-Containing Drug Products     Past Medical History, Surgical history, Social history, and Family History reviewed and unchanged at this time.    Physical Exam:  Blood pressure 128/79, pulse 78, temperature 98.4 F (36.9 C), temperature source Oral, resp. rate 17, height 5\' 9"  (1.753 m), weight 216 lb 1.6 oz (98 kg), SpO2 99  %.   ECOG: 0   General appearance: Comfortable appearing without any discomfort Head: Normocephalic without any trauma Oropharynx: Mucous membranes are moist and pink without any thrush or ulcers. Eyes: Pupils are equal and round reactive to light. Lymph nodes: No cervical, supraclavicular, inguinal or axillary lymphadenopathy.   Heart:regular rate and rhythm.  S1 and S2 without leg edema. Lung: Clear without any rhonchi or wheezes.  No dullness to percussion. Abdomin: Soft, nontender, nondistended with good bowel sounds.  No hepatosplenomegaly. Musculoskeletal: No joint deformity or effusion.  Full range of motion noted in his right shoulder. Neurological: No deficits noted on motor, sensory and deep tendon reflex exam. Skin: No petechial rash or dryness.  Appeared moist.  Psychiatric: Mood and affect appeared appropriate.     Lab Results: Lab Results  Component Value Date   WBC 5.6 04/30/2018   HGB 11.7 (L) 04/30/2018   HCT 34.5 (L) 04/30/2018   MCV 87.6 04/30/2018   PLT 225 04/30/2018     Chemistry      Component Value Date/Time   NA 144 04/30/2018 0805   NA 141 08/08/2017 1116   K 3.4 (L) 04/30/2018 0805   K 3.6 08/08/2017 1116   CL 109 04/30/2018 0805   CO2 25 04/30/2018 0805   CO2 24 08/08/2017 1116   BUN 13 04/30/2018 0805   BUN 13.2 08/08/2017 1116   CREATININE 0.90 04/30/2018 0805   CREATININE 1.1 08/08/2017 1116      Component Value Date/Time   CALCIUM 8.7 (L) 04/30/2018 0805   CALCIUM 9.1 08/08/2017 1116   ALKPHOS 219 (H) 04/30/2018 0805   ALKPHOS 214 (H) 08/08/2017 1116   AST 13 (L) 04/30/2018 0805   AST 17 08/08/2017 1116   ALT 18 04/30/2018 0805   ALT 17 08/08/2017 1116   BILITOT 0.3 04/30/2018 0805   BILITOT 0.29 08/08/2017 1116          Impression and Plan:  72 year old man with:   1. Castration-resistant metastatic prostate cancer with metastatic disease to the bone developed in 2017.    He has progressed on Zytiga and currently  receiving Taxotere chemotherapy.  He tolerated the first cycle well without any complications.  Risks and benefits of continuing this therapy long-term was reviewed and complication associated with chemotherapy was reiterated.  Plan is to continue with the same dose and schedule and monitor his PSA.  The goal is to treat him for 10 cycles depending on his tolerance.  2. Androgen depravation: Lupron will be continued every 4 months and will be due in October 2019.  He has no objections in continuing at this time.  3.  Right shoulder pain: His imaging studies were personally reviewed and did show sclerotic lesion in his scapula.  I do not think his pain is related to sclerotic bony metastasis.  Radiation therapy can be considered in the future.  4. Hypertension: Blood pressure continues to  be normal off Zytiga.  5.  Goals of care: His performance status remains excellent and aggressive therapy is warranted.  His disease remains palliative.  6.  Bone directed therapy: He continues to take calcium and vitamin D.  The role of Delton See will be considered once he has tolerated chemotherapy for few cycles.  He will need dental clearance before doing so.  7.  IV access: Port-A-Cath inserted and in use without any issues.  8.  Antiemetics: Compazine has been effective in controlling his nausea at this time.  9. Follow-up: in 3 weeks for the next cycle of chemotherapy.  25  minutes was spent with the patient face-to-face today.  More than 50% of time was dedicated to discussing the disease treatment options, long-term complication associated with therapy as well as reviewing imaging studies including x-rays of his shoulder and chest x-ray.   Zola Button, MD 9/11/201912:24 PM

## 2018-05-22 ENCOUNTER — Telehealth: Payer: Self-pay | Admitting: *Deleted

## 2018-05-22 LAB — PROSTATE-SPECIFIC AG, SERUM (LABCORP): Prostate Specific Ag, Serum: 112 ng/mL — ABNORMAL HIGH (ref 0.0–4.0)

## 2018-05-22 NOTE — Telephone Encounter (Signed)
Spoke with patient. Per dr Alen Blew, PSA remains the same, but he feels it will go down with more cycles of chemotherapy

## 2018-05-22 NOTE — Telephone Encounter (Signed)
-----   Message from Wyatt Portela, MD sent at 05/22/2018  8:19 AM EDT ----- Please let him know his PSA is the same. It will start declining soon after more chemotherapy cycles.

## 2018-05-23 ENCOUNTER — Inpatient Hospital Stay: Payer: Medicare Other

## 2018-05-23 ENCOUNTER — Other Ambulatory Visit: Payer: Self-pay | Admitting: Student in an Organized Health Care Education/Training Program

## 2018-05-23 VITALS — BP 135/78 | HR 80 | Temp 98.1°F | Resp 18

## 2018-05-23 DIAGNOSIS — Z79899 Other long term (current) drug therapy: Secondary | ICD-10-CM | POA: Diagnosis not present

## 2018-05-23 DIAGNOSIS — C61 Malignant neoplasm of prostate: Secondary | ICD-10-CM

## 2018-05-23 DIAGNOSIS — Z5111 Encounter for antineoplastic chemotherapy: Secondary | ICD-10-CM | POA: Diagnosis not present

## 2018-05-23 DIAGNOSIS — I10 Essential (primary) hypertension: Secondary | ICD-10-CM | POA: Diagnosis not present

## 2018-05-23 DIAGNOSIS — C7951 Secondary malignant neoplasm of bone: Secondary | ICD-10-CM | POA: Diagnosis not present

## 2018-05-23 MED ORDER — PEGFILGRASTIM-CBQV 6 MG/0.6ML ~~LOC~~ SOSY
6.0000 mg | PREFILLED_SYRINGE | Freq: Once | SUBCUTANEOUS | Status: AC
  Administered 2018-05-23: 6 mg via SUBCUTANEOUS

## 2018-05-25 ENCOUNTER — Other Ambulatory Visit: Payer: Self-pay | Admitting: Student in an Organized Health Care Education/Training Program

## 2018-05-25 DIAGNOSIS — C61 Malignant neoplasm of prostate: Secondary | ICD-10-CM

## 2018-06-08 ENCOUNTER — Other Ambulatory Visit: Payer: Self-pay | Admitting: Oncology

## 2018-06-11 ENCOUNTER — Inpatient Hospital Stay: Payer: Medicare Other

## 2018-06-11 ENCOUNTER — Inpatient Hospital Stay (HOSPITAL_BASED_OUTPATIENT_CLINIC_OR_DEPARTMENT_OTHER): Payer: Medicare Other | Admitting: Oncology

## 2018-06-11 ENCOUNTER — Inpatient Hospital Stay: Payer: Medicare Other | Attending: Oncology

## 2018-06-11 ENCOUNTER — Telehealth: Payer: Self-pay

## 2018-06-11 ENCOUNTER — Other Ambulatory Visit: Payer: Self-pay | Admitting: Oncology

## 2018-06-11 VITALS — BP 143/82 | HR 89 | Temp 98.4°F | Resp 18 | Ht 69.0 in | Wt 219.7 lb

## 2018-06-11 DIAGNOSIS — C61 Malignant neoplasm of prostate: Secondary | ICD-10-CM | POA: Insufficient documentation

## 2018-06-11 DIAGNOSIS — I10 Essential (primary) hypertension: Secondary | ICD-10-CM

## 2018-06-11 DIAGNOSIS — D6481 Anemia due to antineoplastic chemotherapy: Secondary | ICD-10-CM | POA: Diagnosis not present

## 2018-06-11 DIAGNOSIS — Z5111 Encounter for antineoplastic chemotherapy: Secondary | ICD-10-CM | POA: Insufficient documentation

## 2018-06-11 DIAGNOSIS — C7951 Secondary malignant neoplasm of bone: Secondary | ICD-10-CM | POA: Insufficient documentation

## 2018-06-11 DIAGNOSIS — Z79899 Other long term (current) drug therapy: Secondary | ICD-10-CM | POA: Insufficient documentation

## 2018-06-11 DIAGNOSIS — Z95828 Presence of other vascular implants and grafts: Secondary | ICD-10-CM

## 2018-06-11 DIAGNOSIS — Z7689 Persons encountering health services in other specified circumstances: Secondary | ICD-10-CM | POA: Insufficient documentation

## 2018-06-11 LAB — CMP (CANCER CENTER ONLY)
ALT: 11 U/L (ref 0–44)
AST: 13 U/L — ABNORMAL LOW (ref 15–41)
Albumin: 3.5 g/dL (ref 3.5–5.0)
Alkaline Phosphatase: 163 U/L — ABNORMAL HIGH (ref 38–126)
Anion gap: 9 (ref 5–15)
BUN: 13 mg/dL (ref 8–23)
CO2: 24 mmol/L (ref 22–32)
Calcium: 8.5 mg/dL — ABNORMAL LOW (ref 8.9–10.3)
Chloride: 109 mmol/L (ref 98–111)
Creatinine: 1.01 mg/dL (ref 0.61–1.24)
GFR, Est AFR Am: >60 mL/min (ref >60)
GFR, Estimated: >60 mL/min (ref >60)
Glucose, Bld: 124 mg/dL — ABNORMAL HIGH (ref 70–99)
Potassium: 3.8 mmol/L (ref 3.5–5.1)
Sodium: 142 mmol/L (ref 135–145)
Total Bilirubin: 0.3 mg/dL (ref 0.3–1.2)
Total Protein: 6.3 g/dL — ABNORMAL LOW (ref 6.5–8.1)

## 2018-06-11 LAB — CBC WITH DIFFERENTIAL (CANCER CENTER ONLY)
Basophils Absolute: 0.1 10*3/uL (ref 0.0–0.1)
Basophils Relative: 1 %
Eosinophils Absolute: 0.1 10*3/uL (ref 0.0–0.5)
Eosinophils Relative: 2 %
HCT: 30 % — ABNORMAL LOW (ref 38.4–49.9)
Hemoglobin: 10 g/dL — ABNORMAL LOW (ref 13.0–17.1)
Lymphocytes Relative: 22 %
Lymphs Abs: 1.1 10*3/uL (ref 0.9–3.3)
MCH: 29.5 pg (ref 27.2–33.4)
MCHC: 33.3 g/dL (ref 32.0–36.0)
MCV: 88.5 fL (ref 79.3–98.0)
Monocytes Absolute: 0.6 10*3/uL (ref 0.1–0.9)
Monocytes Relative: 12 %
Neutro Abs: 3.1 10*3/uL (ref 1.5–6.5)
Neutrophils Relative %: 63 %
Platelet Count: 297 10*3/uL (ref 140–400)
RBC: 3.39 MIL/uL — ABNORMAL LOW (ref 4.20–5.82)
RDW: 17.9 % — ABNORMAL HIGH (ref 11.0–14.6)
WBC Count: 4.9 10*3/uL (ref 4.0–10.3)

## 2018-06-11 MED ORDER — PROCHLORPERAZINE MALEATE 10 MG PO TABS
ORAL_TABLET | ORAL | Status: AC
  Filled 2018-06-11: qty 1

## 2018-06-11 MED ORDER — LEUPROLIDE ACETATE (4 MONTH) 30 MG IM KIT
30.0000 mg | PACK | Freq: Once | INTRAMUSCULAR | Status: AC
  Administered 2018-06-11: 30 mg via INTRAMUSCULAR
  Filled 2018-06-11: qty 30

## 2018-06-11 MED ORDER — DEXAMETHASONE SODIUM PHOSPHATE 10 MG/ML IJ SOLN
10.0000 mg | Freq: Once | INTRAMUSCULAR | Status: AC
  Administered 2018-06-11: 10 mg via INTRAVENOUS

## 2018-06-11 MED ORDER — HEPARIN SOD (PORK) LOCK FLUSH 100 UNIT/ML IV SOLN
500.0000 [IU] | Freq: Once | INTRAVENOUS | Status: AC | PRN
  Administered 2018-06-11: 500 [IU]
  Filled 2018-06-11: qty 5

## 2018-06-11 MED ORDER — SODIUM CHLORIDE 0.9% FLUSH
10.0000 mL | INTRAVENOUS | Status: DC | PRN
  Administered 2018-06-11: 10 mL
  Filled 2018-06-11: qty 10

## 2018-06-11 MED ORDER — SODIUM CHLORIDE 0.9 % IV SOLN
Freq: Once | INTRAVENOUS | Status: AC
  Administered 2018-06-11: 09:00:00 via INTRAVENOUS
  Filled 2018-06-11: qty 250

## 2018-06-11 MED ORDER — SODIUM CHLORIDE 0.9 % IV SOLN
75.0000 mg/m2 | Freq: Once | INTRAVENOUS | Status: AC
  Administered 2018-06-11: 160 mg via INTRAVENOUS
  Filled 2018-06-11: qty 16

## 2018-06-11 MED ORDER — DEXAMETHASONE SODIUM PHOSPHATE 10 MG/ML IJ SOLN
INTRAMUSCULAR | Status: AC
  Filled 2018-06-11: qty 1

## 2018-06-11 MED ORDER — LEUPROLIDE ACETATE (3 MONTH) 22.5 MG IM KIT
PACK | INTRAMUSCULAR | Status: AC
  Filled 2018-06-11: qty 22.5

## 2018-06-11 MED ORDER — SODIUM CHLORIDE 0.9% FLUSH
10.0000 mL | INTRAVENOUS | Status: DC | PRN
  Administered 2018-06-11: 10 mL via INTRAVENOUS
  Filled 2018-06-11: qty 10

## 2018-06-11 MED ORDER — PROCHLORPERAZINE MALEATE 10 MG PO TABS: 10.0000 mg | ORAL_TABLET | Freq: Four times a day (QID) | ORAL | 3 refills | Status: DC | PRN

## 2018-06-11 NOTE — Telephone Encounter (Signed)
Printed avs and calender of upcoming appointment. Per 10/2 

## 2018-06-11 NOTE — Patient Instructions (Signed)
[facility] Discharge Instructions for Patients Receiving Chemotherapy  Today you received the following chemotherapy agents: Docetaxel (Taxotere).  To help prevent nausea and vomiting after your treatment, we encourage you to take your nausea medication as prescribed.  If you develop nausea and vomiting that is not controlled by your nausea medication, call the clinic.   BELOW ARE SYMPTOMS THAT SHOULD BE REPORTED IMMEDIATELY:  *FEVER GREATER THAN 100.5 F  *CHILLS WITH OR WITHOUT FEVER  NAUSEA AND VOMITING THAT IS NOT CONTROLLED WITH YOUR NAUSEA MEDICATION  *UNUSUAL SHORTNESS OF BREATH  *UNUSUAL BRUISING OR BLEEDING  TENDERNESS IN MOUTH AND THROAT WITH OR WITHOUT PRESENCE OF ULCERS  *URINARY PROBLEMS  *BOWEL PROBLEMS  UNUSUAL RASH Items with * indicate a potential emergency and should be followed up as soon as possible.  Feel free to call the clinic should you have any questions or concerns. The clinic phone number is [phone].  Please show the CHEMO ALERT CARD at check-in to the Emergency Department and triage nurse.   

## 2018-06-11 NOTE — Progress Notes (Signed)
Hematology and Oncology Follow Up Visit  Dennis Fischer [id number] [birth year] 72 y.o. 06/11/2018 8:03 AM Dennis Fischer, MDFeng, Dennis Purpura, MD   Principle Diagnosis: 72 year old man with castration-resistant prostate cancer with disease to the bone.  He was initially diagnosed with Gleason score of 7 and a PSA of 12.8  in 2016.  Prior Therapy: He received definitive therapy using radiation therapy and androgen deprivation in 2016.  He developed advanced disease with pelvic adenopathy and was treated with androgen deprivation while was living in [place].  He subsequently developed castration resistant disease.  Zytiga 1000 mg daily restarted on 03/14/2017.  Therapy discontinued in August 2018 due to progression of disease.  Current therapy:   Lupron 30 mg every 4 months.    Taxotere chemotherapy at 75 mg/m given every 3 weeks started on the first 2019.  He completed 2 cycles of therapy he is here for cycle 3.   Interim History:  Dennis Fischer presents today for a return visit.  Since the last visit, he received the last cycle of chemotherapy without any complications.  He denies any infusion related complications or worsening neuropathy.  He does report some mild nausea but no vomiting.  He does report some arthralgias associated with his growth factor support.  He remains active and continues to attend activities of daily living.  His quality of life is unchanged and his appetite remain excellent.  He does not report any headaches, blurry vision, syncope or seizures.  He denies any dizziness or confusion.  He does not report any fevers, chills or sweats.  He does not report any cough, wheezing or hemoptysis. He does not report any chest pain, palpitation, orthopnea.  Denies any nausea, vomiting or abdominal pain.  He denies any constipation or diarrhea.  He does not report any frequency urgency or hesitancy.  He does not report any bleeding or clotting tendency.  He does not report any  lymphadenopathy.  He denies any diarrhea or depression.  He denies any arthralgias or myalgias.  Remaining review of systems is negative.   Medications: I have reviewed the patient's current medications.  Current Outpatient Medications  Medication Sig Dispense Refill  . Acetaminophen (TYLENOL PO) Take 500 mg by mouth daily as needed.    Marland Kitchen amLODipine (NORVASC) 10 MG tablet Take 1 tablet (10 mg total) by mouth daily. 90 tablet 3  . aspirin 81 MG tablet Take 81 mg by mouth daily.    . calcium-vitamin D (OSCAL WITH D) 500-200 MG-UNIT tablet Take 1 tablet by mouth 2 (two) times daily. 60 tablet 3  . carvedilol (COREG) 12.5 MG tablet TAKE 1 TABLET BY MOUTH TWICE DAILY WITH A MEAL 180 tablet 3  . lidocaine-prilocaine (EMLA) cream Apply 1 application topically as needed. 30 g 0  . Multiple Vitamin (MULTIVITAMIN) tablet Take 1 tablet by mouth daily.    Marland Kitchen omeprazole (PRILOSEC) 20 MG capsule TAKE 1 CAPSULE BY MOUTH DAILY 30 capsule 0  . potassium chloride SA (K-DUR,KLOR-CON) 20 MEQ tablet TAKE 1 TABLET(20 MEQ) BY MOUTH DAILY 90 tablet 0  . prochlorperazine (COMPAZINE) 10 MG tablet TAKE 1 TABLET(10 MG) BY MOUTH EVERY 6 HOURS AS NEEDED FOR NAUSEA OR VOMITING 385 tablet 0  . sildenafil (VIAGRA) 100 MG tablet Take 1 tablet (100 mg total) by mouth daily as needed for erectile dysfunction. 30 tablet 3  . simvastatin (ZOCOR) 20 MG tablet TAKE 1 TABLET BY MOUTH EVERY DAY 90 tablet 3   No current facility-administered medications for this visit.  Facility-Administered Medications Ordered in Other Visits  Medication Dose Route Frequency Provider Last Rate Last Dose  . sodium chloride flush (NS) 0.9 % injection 10 mL  10 mL Intravenous PRN Wyatt Portela, MD   10 mL at 06/11/18 0751     Allergies:  Allergies  Allergen Reactions  . Lisinopril Swelling    Facial swelling  . Mango Flavor   . Peanut-Containing Drug Products     Past Medical History, Surgical history, Social history, and Family History  reviewed and unchanged at this time.    Physical Exam:  Blood pressure (!) 143/82, pulse 89, temperature 98.4 F (36.9 C), temperature source Oral, resp. rate 18, height 5\' 9"  (1.753 m), weight 219 lb 11.2 oz (99.7 kg), SpO2 98 %.    ECOG: 0   General appearance: Alert, awake without any distress. Head: Atraumatic without abnormalities Oropharynx: Without any thrush or ulcers. Eyes: No scleral icterus. Lymph nodes: No lymphadenopathy noted in the cervical, supraclavicular, or axillary nodes Heart:regular rate and rhythm, without any murmurs or gallops.   Lung: Clear to auscultation without any rhonchi, wheezes or dullness to percussion. Abdomin: Soft, nontender without any shifting dullness or ascites. Musculoskeletal: No clubbing or cyanosis. Neurological: No motor or sensory deficits. Skin: No rashes or lesions. Psychiatric: Mood and affect appeared normal.       Lab Results: Lab Results  Component Value Date   WBC 6.0 05/21/2018   HGB 10.7 (L) 05/21/2018   HCT 32.2 (L) 05/21/2018   MCV 87.9 05/21/2018   PLT 472 (H) 05/21/2018     Chemistry      Component Value Date/Time   NA 141 05/21/2018 1159   NA 141 08/08/2017 1116   K 3.8 05/21/2018 1159   K 3.6 08/08/2017 1116   CL 109 05/21/2018 1159   CO2 23 05/21/2018 1159   CO2 24 08/08/2017 1116   BUN 15 05/21/2018 1159   BUN 13.2 08/08/2017 1116   CREATININE 0.99 05/21/2018 1159   CREATININE 1.1 08/08/2017 1116      Component Value Date/Time   CALCIUM 9.0 05/21/2018 1159   CALCIUM 9.1 08/08/2017 1116   ALKPHOS 194 (H) 05/21/2018 1159   ALKPHOS 214 (H) 08/08/2017 1116   AST 13 (L) 05/21/2018 1159   AST 17 08/08/2017 1116   ALT 15 05/21/2018 1159   ALT 17 08/08/2017 1116   BILITOT 0.3 05/21/2018 1159   BILITOT 0.29 08/08/2017 1116          Impression and Plan:  73 year old man with:   1. Castration-resistant prostate cancer with disease to the bone developed in 2017.     He completed 2  cycles of Taxotere chemotherapy without any major complications.  His PSA has stabilized at this time but has yet to decline.  Risks and benefits of continuing chemotherapy at this time was reviewed today.  Long-term complications were also reviewed.  After discussion today he is agreeable to continue.  2. Androgen depravation: He is due for Lupron today and will be repeated every 4 months.  Complication associated with this medication was reviewed today include osteoporosis and weight gain among others.  3.  Right shoulder pain: Resolved at this time and does not take any pain medication for it.  4. Hypertension: His blood pressure is within normal range after stopping Zytiga.  5.  Goals of care: Treatment is palliative although his performance status remains excellent and aggressive therapy is warranted.  6.  Bone directed therapy: He remains on calcium and  vitamin D supplements and will consider Xgeva after obtaining dental clearance.  7.  IV access: Port-A-Cath remains in use without any complications.  8.  Antiemetics: No vomiting reported at this time Compazine is available to him at this time.  9. Follow-up: in 3 weeks for the next cycle of chemotherapy.  25  minutes was spent with the patient face-to-face today.  More than 50% of time was dedicated to reviewing the natural course of his disease, treatment options and managing complications related to therapy.   Zola Button, MD 10/2/20198:03 AM

## 2018-06-12 ENCOUNTER — Telehealth: Payer: Self-pay | Admitting: *Deleted

## 2018-06-12 LAB — PROSTATE-SPECIFIC AG, SERUM (LABCORP): Prostate Specific Ag, Serum: 75.8 ng/mL — ABNORMAL HIGH (ref 0.0–4.0)

## 2018-06-12 NOTE — Telephone Encounter (Signed)
As noted below by Dr. Alen Blew, I left PSA results on his secure cell phone. Instructed him to call [facility] if he had any questions or concerns.

## 2018-06-12 NOTE — Telephone Encounter (Signed)
-----   Message from Wyatt Portela, MD sent at 06/12/2018  7:24 AM EDT ----- Please let him know his PSA is down.

## 2018-06-13 ENCOUNTER — Inpatient Hospital Stay: Payer: Medicare Other

## 2018-06-13 DIAGNOSIS — Z7689 Persons encountering health services in other specified circumstances: Secondary | ICD-10-CM | POA: Diagnosis not present

## 2018-06-13 DIAGNOSIS — C61 Malignant neoplasm of prostate: Secondary | ICD-10-CM | POA: Diagnosis not present

## 2018-06-13 DIAGNOSIS — I10 Essential (primary) hypertension: Secondary | ICD-10-CM | POA: Diagnosis not present

## 2018-06-13 DIAGNOSIS — Z5111 Encounter for antineoplastic chemotherapy: Secondary | ICD-10-CM | POA: Diagnosis not present

## 2018-06-13 DIAGNOSIS — C7951 Secondary malignant neoplasm of bone: Secondary | ICD-10-CM | POA: Diagnosis not present

## 2018-06-13 DIAGNOSIS — D6481 Anemia due to antineoplastic chemotherapy: Secondary | ICD-10-CM | POA: Diagnosis not present

## 2018-06-13 MED ORDER — PEGFILGRASTIM-CBQV 6 MG/0.6ML ~~LOC~~ SOSY
6.0000 mg | PREFILLED_SYRINGE | Freq: Once | SUBCUTANEOUS | Status: AC
  Administered 2018-06-13: 6 mg via SUBCUTANEOUS

## 2018-06-13 MED ORDER — PEGFILGRASTIM-CBQV 6 MG/0.6ML ~~LOC~~ SOSY
PREFILLED_SYRINGE | SUBCUTANEOUS | Status: AC
  Filled 2018-06-13: qty 0.6

## 2018-06-13 NOTE — Patient Instructions (Signed)
Pegfilgrastim injection What is this medicine? PEGFILGRASTIM (PEG fil gra stim) is a long-acting granulocyte colony-stimulating factor that stimulates the growth of neutrophils, a type of white blood cell important in the body's fight against infection. It is used to reduce the incidence of fever and infection in patients with certain types of cancer who are receiving chemotherapy that affects the bone marrow, and to increase survival after being exposed to high doses of radiation. This medicine may be used for other purposes; ask your health care provider or pharmacist if you have questions. COMMON BRAND NAME(S): Neulasta What should I tell my health care provider before I take this medicine? They need to know if you have any of these conditions: -kidney disease -latex allergy -ongoing radiation therapy -sickle cell disease -skin reactions to acrylic adhesives (On-Body Injector only) -an unusual or allergic reaction to pegfilgrastim, filgrastim, other medicines, foods, dyes, or preservatives -pregnant or trying to get pregnant -breast-feeding How should I use this medicine? This medicine is for injection under the skin. If you get this medicine at home, you will be taught how to prepare and give the pre-filled syringe or how to use the On-body Injector. Refer to the patient Instructions for Use for detailed instructions. Use exactly as directed. Tell your healthcare provider immediately if you suspect that the On-body Injector may not have performed as intended or if you suspect the use of the On-body Injector resulted in a missed or partial dose. It is important that you put your used needles and syringes in a special sharps container. Do not put them in a trash can. If you do not have a sharps container, call your pharmacist or healthcare provider to get one. Talk to your pediatrician regarding the use of this medicine in children. While this drug may be prescribed for selected conditions,  precautions do apply. Overdosage: If you think you have taken too much of this medicine contact a poison control center or emergency room at once. NOTE: This medicine is only for you. Do not share this medicine with others. What if I miss a dose? It is important not to miss your dose. Call your doctor or health care professional if you miss your dose. If you miss a dose due to an On-body Injector failure or leakage, a new dose should be administered as soon as possible using a single prefilled syringe for manual use. What may interact with this medicine? Interactions have not been studied. Give your health care provider a list of all the medicines, herbs, non-prescription drugs, or dietary supplements you use. Also tell them if you smoke, drink alcohol, or use illegal drugs. Some items may interact with your medicine. This list may not describe all possible interactions. Give your health care provider a list of all the medicines, herbs, non-prescription drugs, or dietary supplements you use. Also tell them if you smoke, drink alcohol, or use illegal drugs. Some items may interact with your medicine. What should I watch for while using this medicine? You may need blood work done while you are taking this medicine. If you are going to need a MRI, CT scan, or other procedure, tell your doctor that you are using this medicine (On-Body Injector only). What side effects may I notice from receiving this medicine? Side effects that you should report to your doctor or health care professional as soon as possible: -allergic reactions like skin rash, itching or hives, swelling of the face, lips, or tongue -dizziness -fever -pain, redness, or irritation at site   where injected -pinpoint red spots on the skin -red or dark-brown urine -shortness of breath or breathing problems -stomach or side pain, or pain at the shoulder -swelling -tiredness -trouble passing urine or change in the amount of urine Side  effects that usually do not require medical attention (report to your doctor or health care professional if they continue or are bothersome): -bone pain -muscle pain This list may not describe all possible side effects. Call your doctor for medical advice about side effects. You may report side effects to FDA at 1-800-FDA-1088. Where should I keep my medicine? Keep out of the reach of children. Store pre-filled syringes in a refrigerator between 2 and 8 degrees C (36 and 46 degrees F). Do not freeze. Keep in carton to protect from light. Throw away this medicine if it is left out of the refrigerator for more than 48 hours. Throw away any unused medicine after the expiration date. NOTE: This sheet is a summary. It may not cover all possible information. If you have questions about this medicine, talk to your doctor, pharmacist, or health care provider.  2018 Elsevier/Gold Standard (2016-08-23 12:58:03)  

## 2018-06-30 ENCOUNTER — Other Ambulatory Visit: Payer: Self-pay | Admitting: Student in an Organized Health Care Education/Training Program

## 2018-06-30 DIAGNOSIS — C61 Malignant neoplasm of prostate: Secondary | ICD-10-CM

## 2018-07-02 ENCOUNTER — Inpatient Hospital Stay: Payer: Medicare Other

## 2018-07-02 ENCOUNTER — Telehealth: Payer: Self-pay | Admitting: Oncology

## 2018-07-02 ENCOUNTER — Inpatient Hospital Stay (HOSPITAL_BASED_OUTPATIENT_CLINIC_OR_DEPARTMENT_OTHER): Payer: Medicare Other | Admitting: Oncology

## 2018-07-02 VITALS — BP 141/77 | HR 86 | Temp 98.5°F | Resp 18 | Wt 223.3 lb

## 2018-07-02 DIAGNOSIS — C7951 Secondary malignant neoplasm of bone: Secondary | ICD-10-CM | POA: Diagnosis not present

## 2018-07-02 DIAGNOSIS — C61 Malignant neoplasm of prostate: Secondary | ICD-10-CM

## 2018-07-02 DIAGNOSIS — Z5111 Encounter for antineoplastic chemotherapy: Secondary | ICD-10-CM | POA: Diagnosis not present

## 2018-07-02 DIAGNOSIS — Z7689 Persons encountering health services in other specified circumstances: Secondary | ICD-10-CM | POA: Diagnosis not present

## 2018-07-02 DIAGNOSIS — I10 Essential (primary) hypertension: Secondary | ICD-10-CM

## 2018-07-02 DIAGNOSIS — D6481 Anemia due to antineoplastic chemotherapy: Secondary | ICD-10-CM

## 2018-07-02 DIAGNOSIS — Z79899 Other long term (current) drug therapy: Secondary | ICD-10-CM | POA: Diagnosis not present

## 2018-07-02 DIAGNOSIS — Z95828 Presence of other vascular implants and grafts: Secondary | ICD-10-CM | POA: Insufficient documentation

## 2018-07-02 LAB — CBC WITH DIFFERENTIAL (CANCER CENTER ONLY)
Abs Immature Granulocytes: 0.02 10*3/uL (ref 0.00–0.07)
Basophils Absolute: 0 10*3/uL (ref 0.0–0.1)
Basophils Relative: 0 %
Eosinophils Absolute: 0.2 10*3/uL (ref 0.0–0.5)
Eosinophils Relative: 2 %
HCT: 30 % — ABNORMAL LOW (ref 39.0–52.0)
Hemoglobin: 9.5 g/dL — ABNORMAL LOW (ref 13.0–17.0)
Immature Granulocytes: 0 %
Lymphocytes Relative: 16 %
Lymphs Abs: 1.5 10*3/uL (ref 0.7–4.0)
MCH: 29.1 pg (ref 26.0–34.0)
MCHC: 31.7 g/dL (ref 30.0–36.0)
MCV: 91.7 fL (ref 80.0–100.0)
Monocytes Absolute: 1 10*3/uL (ref 0.1–1.0)
Monocytes Relative: 10 %
Neutro Abs: 7 10*3/uL (ref 1.7–7.7)
Neutrophils Relative %: 72 %
Platelet Count: 401 10*3/uL — ABNORMAL HIGH (ref 150–400)
RBC: 3.27 MIL/uL — ABNORMAL LOW (ref 4.22–5.81)
RDW: 17.9 % — ABNORMAL HIGH (ref 11.5–15.5)
WBC Count: 9.8 10*3/uL (ref 4.0–10.5)
nRBC: 0 % (ref 0.0–0.2)

## 2018-07-02 LAB — CMP (CANCER CENTER ONLY)
ALT: 11 U/L (ref 0–44)
AST: 13 U/L — ABNORMAL LOW (ref 15–41)
Albumin: 3.3 g/dL — ABNORMAL LOW (ref 3.5–5.0)
Alkaline Phosphatase: 163 U/L — ABNORMAL HIGH (ref 38–126)
Anion gap: 11 (ref 5–15)
BUN: 12 mg/dL (ref 8–23)
CO2: 22 mmol/L (ref 22–32)
Calcium: 8.7 mg/dL — ABNORMAL LOW (ref 8.9–10.3)
Chloride: 107 mmol/L (ref 98–111)
Creatinine: 0.96 mg/dL (ref 0.61–1.24)
GFR, Est AFR Am: >60 mL/min (ref >60)
GFR, Estimated: >60 mL/min (ref >60)
Glucose, Bld: 141 mg/dL — ABNORMAL HIGH (ref 70–99)
Potassium: 3.8 mmol/L (ref 3.5–5.1)
Sodium: 140 mmol/L (ref 135–145)
Total Bilirubin: 0.3 mg/dL (ref 0.3–1.2)
Total Protein: 6.9 g/dL (ref 6.5–8.1)

## 2018-07-02 MED ORDER — DEXAMETHASONE SODIUM PHOSPHATE 10 MG/ML IJ SOLN
INTRAMUSCULAR | Status: AC
  Filled 2018-07-02: qty 1

## 2018-07-02 MED ORDER — DEXAMETHASONE SODIUM PHOSPHATE 10 MG/ML IJ SOLN
10.0000 mg | Freq: Once | INTRAMUSCULAR | Status: AC
  Administered 2018-07-02: 10 mg via INTRAVENOUS

## 2018-07-02 MED ORDER — SODIUM CHLORIDE 0.9 % IV SOLN
Freq: Once | INTRAVENOUS | Status: AC
  Administered 2018-07-02: 13:00:00 via INTRAVENOUS
  Filled 2018-07-02: qty 250

## 2018-07-02 MED ORDER — SODIUM CHLORIDE 0.9% FLUSH
10.0000 mL | INTRAVENOUS | Status: DC | PRN
  Administered 2018-07-02: 10 mL
  Filled 2018-07-02: qty 10

## 2018-07-02 MED ORDER — SODIUM CHLORIDE 0.9 % IV SOLN
75.0000 mg/m2 | Freq: Once | INTRAVENOUS | Status: AC
  Administered 2018-07-02: 160 mg via INTRAVENOUS
  Filled 2018-07-02: qty 16

## 2018-07-02 MED ORDER — SODIUM CHLORIDE 0.9% FLUSH
10.0000 mL | Freq: Once | INTRAVENOUS | Status: AC
  Administered 2018-07-02: 10 mL
  Filled 2018-07-02: qty 10

## 2018-07-02 MED ORDER — HEPARIN SOD (PORK) LOCK FLUSH 100 UNIT/ML IV SOLN
500.0000 [IU] | Freq: Once | INTRAVENOUS | Status: AC | PRN
  Administered 2018-07-02: 500 [IU]
  Filled 2018-07-02: qty 5

## 2018-07-02 NOTE — Patient Instructions (Signed)
[facility] Discharge Instructions for Patients Receiving Chemotherapy  Today you received the following chemotherapy agents: Docetaxel (Taxotere).  To help prevent nausea and vomiting after your treatment, we encourage you to take your nausea medication as prescribed.  If you develop nausea and vomiting that is not controlled by your nausea medication, call the clinic.   BELOW ARE SYMPTOMS THAT SHOULD BE REPORTED IMMEDIATELY:  *FEVER GREATER THAN 100.5 F  *CHILLS WITH OR WITHOUT FEVER  NAUSEA AND VOMITING THAT IS NOT CONTROLLED WITH YOUR NAUSEA MEDICATION  *UNUSUAL SHORTNESS OF BREATH  *UNUSUAL BRUISING OR BLEEDING  TENDERNESS IN MOUTH AND THROAT WITH OR WITHOUT PRESENCE OF ULCERS  *URINARY PROBLEMS  *BOWEL PROBLEMS  UNUSUAL RASH Items with * indicate a potential emergency and should be followed up as soon as possible.  Feel free to call the clinic should you have any questions or concerns. The clinic phone number is [phone].  Please show the CHEMO ALERT CARD at check-in to the Emergency Department and triage nurse.   

## 2018-07-02 NOTE — Telephone Encounter (Signed)
Appts scheduled avs/calendar printed per 10/23 los °

## 2018-07-02 NOTE — Progress Notes (Signed)
Hematology and Oncology Follow Up Visit  Dennis Fischer [id number] 1946-09-03 72 y.o. 07/02/2018 12:33 PM Dennis Fischer, MDFeng, [name], MD   Principle Diagnosis: 72 year old man with castration-resistant prostate cancer documented in 2018. He was initially diagnosed with Gleason score of 7 and a PSA of 12.8  in 2016.  He subsequently developed advanced disease with bone involvement.  Prior Therapy: He received definitive therapy using radiation therapy and androgen deprivation in 2016.  He developed advanced disease with pelvic adenopathy and was treated with androgen deprivation while was living in [place].  He subsequently developed castration resistant disease.  Zytiga 1000 mg daily restarted on 03/14/2017.  Therapy discontinued in August 2018 due to progression of disease.  Current therapy:   Lupron 30 mg every 4 months.    Taxotere chemotherapy at 75 mg/m given every 3 weeks started on the first 2019.  He is here for cycle 4.    Interim History:  Dennis Fischer returns today for repeat evaluation.  Since the last visit, he received the last cycle of chemotherapy without any complications.  He denies any nausea, fatigue or infusion related complications.  He denies any peripheral neuropathy or worsening diarrhea.  His appetite remain excellent and he is gained weight.  He denies any worsening back pain or bone pain.  He denies any decline in his quality of life.  He remains reasonably active and attends to activities of daily living.  He does not report any headaches, blurry vision, syncope or seizures.  He denies any alteration in mental status or confusion.  He does not report any fevers, chills or sweats.  He does not report any cough, wheezing or hemoptysis. He does not report any chest pain, palpitation, orthopnea.  Denies any nausea, vomiting or abdominal pain.  He denies any changes in bowel habits.  He does not report any frequency urgency or hesitancy.  He does not report any  ecchymosis or petechiae.  He does not report any lymphadenopathy.  He denies any mood changes.  He denies any bone pain or pathological fractures.  Remaining review of systems is negative.   Medications: I have reviewed the patient's current medications.  Current Outpatient Medications  Medication Sig Dispense Refill  . Acetaminophen (TYLENOL PO) Take 500 mg by mouth daily as needed.    Marland Kitchen amLODipine (NORVASC) 10 MG tablet Take 1 tablet (10 mg total) by mouth daily. 90 tablet 3  . aspirin 81 MG tablet Take 81 mg by mouth daily.    . calcium-vitamin D (OSCAL WITH D) 500-200 MG-UNIT tablet Take 1 tablet by mouth 2 (two) times daily. 60 tablet 3  . carvedilol (COREG) 12.5 MG tablet TAKE 1 TABLET BY MOUTH TWICE DAILY WITH A MEAL 180 tablet 3  . lidocaine-prilocaine (EMLA) cream Apply 1 application topically as needed. 30 g 0  . Multiple Vitamin (MULTIVITAMIN) tablet Take 1 tablet by mouth daily.    Marland Kitchen omeprazole (PRILOSEC) 20 MG capsule TAKE 1 CAPSULE BY MOUTH DAILY 30 capsule 0  . potassium chloride SA (K-DUR,KLOR-CON) 20 MEQ tablet TAKE 1 TABLET(20 MEQ) BY MOUTH DAILY 90 tablet 0  . prochlorperazine (COMPAZINE) 10 MG tablet TAKE 1 TABLET BY MOUTH EVERY 6 HOURS AS NEEDED FOR NAUSEA OR VOMITING 368 tablet 3  . sildenafil (VIAGRA) 100 MG tablet Take 1 tablet (100 mg total) by mouth daily as needed for erectile dysfunction. 30 tablet 3  . simvastatin (ZOCOR) 20 MG tablet TAKE 1 TABLET BY MOUTH EVERY DAY 90 tablet 3   No  current facility-administered medications for this visit.      Allergies:  Allergies  Allergen Reactions  . Lisinopril Swelling    Facial swelling  . Mango Flavor   . Peanut-Containing Drug Products     Past Medical History, Surgical history, Social history, and Family History reviewed and unchanged at this time.    Physical Exam:  Blood pressure (!) 141/77, pulse 86, temperature 98.5 F (36.9 C), temperature source Oral, resp. rate 18, weight 223 lb 5 oz (101.3 kg),  SpO2 99 %.     ECOG: 0   General appearance: Comfortable appearing without any discomfort Head: Normocephalic without any trauma Oropharynx: Mucous membranes are moist and pink without any thrush or ulcers. Eyes: Pupils are equal and round reactive to light. Lymph nodes: No cervical, supraclavicular, inguinal or axillary lymphadenopathy.   Heart:regular rate and rhythm.  S1 and S2 without leg edema. Lung: Clear without any rhonchi or wheezes.  No dullness to percussion. Abdomin: Soft, nontender, nondistended with good bowel sounds.  No hepatosplenomegaly. Musculoskeletal: No joint deformity or effusion.  Full range of motion noted. Neurological: No deficits noted on motor, sensory and deep tendon reflex exam. Skin: No petechial rash or dryness.  Appeared moist.  l.       Lab Results: Lab Results  Component Value Date   WBC 4.9 06/11/2018   HGB 10.0 (L) 06/11/2018   HCT 30.0 (L) 06/11/2018   MCV 88.5 06/11/2018   PLT 297 06/11/2018     Chemistry      Component Value Date/Time   NA 142 06/11/2018 0800   NA 141 08/08/2017 1116   K 3.8 06/11/2018 0800   K 3.6 08/08/2017 1116   CL 109 06/11/2018 0800   CO2 24 06/11/2018 0800   CO2 24 08/08/2017 1116   BUN 13 06/11/2018 0800   BUN 13.2 08/08/2017 1116   CREATININE 1.01 06/11/2018 0800   CREATININE 1.1 08/08/2017 1116      Component Value Date/Time   CALCIUM 8.5 (L) 06/11/2018 0800   CALCIUM 9.1 08/08/2017 1116   ALKPHOS 163 (H) 06/11/2018 0800   ALKPHOS 214 (H) 08/08/2017 1116   AST 13 (L) 06/11/2018 0800   AST 17 08/08/2017 1116   ALT 11 06/11/2018 0800   ALT 17 08/08/2017 1116   BILITOT 0.3 06/11/2018 0800   BILITOT 0.29 08/08/2017 1116        Results for Dennis Fischer (MRN [id number]) as of 07/02/2018 12:33  Ref. Range 05/21/2018 11:59 06/11/2018 08:00  Prostate Specific Ag, Serum Latest Ref Range: 0.0 - 4.0 ng/mL 112.0 (H) 75.8 (H)    Impression and Plan:  72 year old man with:   1.  Castration-resistant prostate cancer initially diagnosed in 2016 and subsequently developed disease to the bone in 2017.     He is currently on Taxotere chemotherapy with excellent response and tolerance at this time.  His PSA after 2 cycles of therapy declined to 75.8 from 112.  Risks and benefits of continuing this treatment as well as long-term complication associated with this approach was reviewed.  After discussion today he is agreeable to continue.  2. Androgen depravation: The plan is to continue with Lupron every 4 months indefinitely.  Last Lupron was given on June 11, 2018 will be repeated in 4 months.  Long-term complication associated with this treatment was reviewed today which includes weight gain, sexual dysfunction, hot flashes among others.  3.  Right shoulder pain: Appears to have resolved at this time without any issues.  4.  Anemia: Related to chemotherapy and he is asymptomatic.  We will continue to monitor counts and consider packed red cell transfusion if needed to in the future.  5.  Goals of care: Treatment remains palliative although his performance status remain excellent and aggressive therapy is warranted.  6.  Bone directed therapy: I recommended continuing calcium and vitamin D supplements and will consider Xgeva in the future.  Dental clearance remains pending.  7.  IV access: Port-A-Cath has been used and flushed periodically without any issues.  8.  Antiemetics: No nausea or vomiting reported at this time.  Compazine is available to him.  9. Follow-up: in 3 weeks for the next cycle of chemotherapy.  25  minutes was spent with the patient face-to-face today.  More than 50% of time was dedicated to discussing disease status, complications related to this therapy and answering questions regarding long term plan of care.  Zola Button, MD 10/23/201912:33 PM

## 2018-07-03 ENCOUNTER — Telehealth: Payer: Self-pay

## 2018-07-03 LAB — PROSTATE-SPECIFIC AG, SERUM (LABCORP): Prostate Specific Ag, Serum: 80.4 ng/mL — ABNORMAL HIGH (ref 0.0–4.0)

## 2018-07-03 NOTE — Telephone Encounter (Signed)
-----   Message from Wyatt Portela, MD sent at 07/03/2018  8:06 AM EDT ----- Please let him know his PSA is up slightly. No changes for now.

## 2018-07-03 NOTE — Telephone Encounter (Signed)
Contacted patient and made aware of results. 

## 2018-07-04 ENCOUNTER — Inpatient Hospital Stay: Payer: Medicare Other

## 2018-07-04 DIAGNOSIS — Z5111 Encounter for antineoplastic chemotherapy: Secondary | ICD-10-CM | POA: Diagnosis not present

## 2018-07-04 DIAGNOSIS — C61 Malignant neoplasm of prostate: Secondary | ICD-10-CM

## 2018-07-04 DIAGNOSIS — D6481 Anemia due to antineoplastic chemotherapy: Secondary | ICD-10-CM | POA: Diagnosis not present

## 2018-07-04 DIAGNOSIS — C7951 Secondary malignant neoplasm of bone: Secondary | ICD-10-CM | POA: Diagnosis not present

## 2018-07-04 DIAGNOSIS — I10 Essential (primary) hypertension: Secondary | ICD-10-CM | POA: Diagnosis not present

## 2018-07-04 DIAGNOSIS — Z7689 Persons encountering health services in other specified circumstances: Secondary | ICD-10-CM | POA: Diagnosis not present

## 2018-07-04 MED ORDER — PEGFILGRASTIM-CBQV 6 MG/0.6ML ~~LOC~~ SOSY
PREFILLED_SYRINGE | SUBCUTANEOUS | Status: AC
  Filled 2018-07-04: qty 0.6

## 2018-07-04 MED ORDER — PEGFILGRASTIM-CBQV 6 MG/0.6ML ~~LOC~~ SOSY
6.0000 mg | PREFILLED_SYRINGE | Freq: Once | SUBCUTANEOUS | Status: AC
  Administered 2018-07-04: 6 mg via SUBCUTANEOUS

## 2018-07-20 ENCOUNTER — Other Ambulatory Visit: Payer: Self-pay | Admitting: Student in an Organized Health Care Education/Training Program

## 2018-07-20 DIAGNOSIS — C61 Malignant neoplasm of prostate: Secondary | ICD-10-CM

## 2018-07-23 ENCOUNTER — Inpatient Hospital Stay: Payer: Medicare Other

## 2018-07-23 ENCOUNTER — Telehealth: Payer: Self-pay | Admitting: Oncology

## 2018-07-23 ENCOUNTER — Inpatient Hospital Stay: Payer: Medicare Other | Attending: Oncology

## 2018-07-23 ENCOUNTER — Inpatient Hospital Stay (HOSPITAL_BASED_OUTPATIENT_CLINIC_OR_DEPARTMENT_OTHER): Payer: Medicare Other | Admitting: Oncology

## 2018-07-23 VITALS — BP 121/71 | HR 85 | Temp 99.1°F | Resp 18 | Ht 69.0 in | Wt 223.6 lb

## 2018-07-23 DIAGNOSIS — Z5111 Encounter for antineoplastic chemotherapy: Secondary | ICD-10-CM | POA: Diagnosis not present

## 2018-07-23 DIAGNOSIS — D649 Anemia, unspecified: Secondary | ICD-10-CM | POA: Diagnosis not present

## 2018-07-23 DIAGNOSIS — C7951 Secondary malignant neoplasm of bone: Secondary | ICD-10-CM

## 2018-07-23 DIAGNOSIS — C61 Malignant neoplasm of prostate: Secondary | ICD-10-CM | POA: Insufficient documentation

## 2018-07-23 DIAGNOSIS — Z79899 Other long term (current) drug therapy: Secondary | ICD-10-CM

## 2018-07-23 DIAGNOSIS — Z95828 Presence of other vascular implants and grafts: Secondary | ICD-10-CM

## 2018-07-23 LAB — CBC WITH DIFFERENTIAL (CANCER CENTER ONLY)
Abs Immature Granulocytes: 0.02 10*3/uL (ref 0.00–0.07)
Basophils Absolute: 0 10*3/uL (ref 0.0–0.1)
Basophils Relative: 1 %
Eosinophils Absolute: 0.4 10*3/uL (ref 0.0–0.5)
Eosinophils Relative: 6 %
HCT: 30.6 % — ABNORMAL LOW (ref 39.0–52.0)
Hemoglobin: 9.6 g/dL — ABNORMAL LOW (ref 13.0–17.0)
Immature Granulocytes: 0 %
Lymphocytes Relative: 24 %
Lymphs Abs: 1.5 10*3/uL (ref 0.7–4.0)
MCH: 29.3 pg (ref 26.0–34.0)
MCHC: 31.4 g/dL (ref 30.0–36.0)
MCV: 93.3 fL (ref 80.0–100.0)
Monocytes Absolute: 0.7 10*3/uL (ref 0.1–1.0)
Monocytes Relative: 11 %
Neutro Abs: 3.5 10*3/uL (ref 1.7–7.7)
Neutrophils Relative %: 58 %
Platelet Count: 341 10*3/uL (ref 150–400)
RBC: 3.28 MIL/uL — ABNORMAL LOW (ref 4.22–5.81)
RDW: 18.6 % — ABNORMAL HIGH (ref 11.5–15.5)
WBC Count: 6.1 10*3/uL (ref 4.0–10.5)
nRBC: 0 % (ref 0.0–0.2)

## 2018-07-23 LAB — CMP (CANCER CENTER ONLY)
ALT: 10 U/L (ref 0–44)
AST: 14 U/L — ABNORMAL LOW (ref 15–41)
Albumin: 3.3 g/dL — ABNORMAL LOW (ref 3.5–5.0)
Alkaline Phosphatase: 141 U/L — ABNORMAL HIGH (ref 38–126)
Anion gap: 8 (ref 5–15)
BUN: 14 mg/dL (ref 8–23)
CO2: 23 mmol/L (ref 22–32)
Calcium: 8.6 mg/dL — ABNORMAL LOW (ref 8.9–10.3)
Chloride: 110 mmol/L (ref 98–111)
Creatinine: 1.02 mg/dL (ref 0.61–1.24)
GFR, Est AFR Am: >60 mL/min (ref >60)
GFR, Estimated: >60 mL/min (ref >60)
Glucose, Bld: 104 mg/dL — ABNORMAL HIGH (ref 70–99)
Potassium: 4 mmol/L (ref 3.5–5.1)
Sodium: 141 mmol/L (ref 135–145)
Total Bilirubin: 0.4 mg/dL (ref 0.3–1.2)
Total Protein: 6.4 g/dL — ABNORMAL LOW (ref 6.5–8.1)

## 2018-07-23 MED ORDER — HEPARIN SOD (PORK) LOCK FLUSH 100 UNIT/ML IV SOLN
500.0000 [IU] | Freq: Once | INTRAVENOUS | Status: AC | PRN
  Administered 2018-07-23: 500 [IU]
  Filled 2018-07-23: qty 5

## 2018-07-23 MED ORDER — SODIUM CHLORIDE 0.9 % IV SOLN
Freq: Once | INTRAVENOUS | Status: AC
  Administered 2018-07-23: 10:00:00 via INTRAVENOUS
  Filled 2018-07-23: qty 250

## 2018-07-23 MED ORDER — SODIUM CHLORIDE 0.9 % IV SOLN
75.0000 mg/m2 | Freq: Once | INTRAVENOUS | Status: AC
  Administered 2018-07-23: 160 mg via INTRAVENOUS
  Filled 2018-07-23: qty 16

## 2018-07-23 MED ORDER — SODIUM CHLORIDE 0.9% FLUSH
10.0000 mL | INTRAVENOUS | Status: DC | PRN
  Administered 2018-07-23: 10 mL
  Filled 2018-07-23: qty 10

## 2018-07-23 MED ORDER — DEXAMETHASONE SODIUM PHOSPHATE 10 MG/ML IJ SOLN
10.0000 mg | Freq: Once | INTRAMUSCULAR | Status: AC
  Administered 2018-07-23: 10 mg via INTRAVENOUS

## 2018-07-23 MED ORDER — SODIUM CHLORIDE 0.9% FLUSH
10.0000 mL | Freq: Once | INTRAVENOUS | Status: AC
  Administered 2018-07-23: 10 mL
  Filled 2018-07-23: qty 10

## 2018-07-23 MED ORDER — DEXAMETHASONE SODIUM PHOSPHATE 10 MG/ML IJ SOLN
INTRAMUSCULAR | Status: AC
  Filled 2018-07-23: qty 1

## 2018-07-23 NOTE — Telephone Encounter (Signed)
Scheduled appt per 11/13 los - gave patient AVS and calender per los.   

## 2018-07-23 NOTE — Progress Notes (Signed)
Hematology and Oncology Follow Up Visit  Dennis Fischer [id number] 02-06-1946 72 y.o. 07/23/2018 9:28 AM [name], MDFeng, [name], MD   Principle Diagnosis: 71 year old man with advanced prostate cancer with disease in the bone documented in 2018.  He has castration-resistant disease with initial diagnosis of Gleason score of 7 and a PSA of 12.8  in 2016.    Prior Therapy: He received definitive therapy using radiation therapy and androgen deprivation in 2016.  He developed advanced disease with pelvic adenopathy and was treated with androgen deprivation while was living in [place].  He subsequently developed castration resistant disease.  Zytiga 1000 mg daily restarted on 03/14/2017.  Therapy discontinued in August 2018 due to progression of disease.  Current therapy:   Lupron 30 mg every 4 months.    Taxotere chemotherapy at 75 mg/m given every 3 weeks started on the first 2019.  He is here for cycle 5 with planned 6 cycles of therapy.   Interim History:  Mr. Lasecki is here for a follow-up visit.  Since last visit, he reports no major complaints.  He has tolerated chemotherapy without any recent complaints.  He denies any worsening neuropathy, infusion related complications or excessive fatigue.  He does report some tiredness occasionally as well as heaviness in his legs but still able to ambulate and attends to activities of daily living.  He denies any worsening bone pain.  He denies any decline in his quality of life.  Continues to eat well and maintains good appetite.  He does not report any headaches, blurry vision, syncope or seizures.  He denies any dizziness or confusion.  He does not report any fevers, chills or sweats.  He does not report any cough, wheezing or hemoptysis. He does not report any chest pain, palpitation, orthopnea.  Denies any nausea, vomiting or early satiety.  He denies any constipation or diarrhea.  He does not report any frequency urgency or hesitancy.  He  does not report any bleeding or clotting tendency.  He does not report any skin rashes or lesions.  He denies any anxiety or depression.  He denies any arthralgias or myalgias.  Remaining review of systems is negative.   Medications: I have reviewed the patient's current medications.  Current Outpatient Medications  Medication Sig Dispense Refill  . Acetaminophen (TYLENOL PO) Take 500 mg by mouth daily as needed.    Marland Kitchen amLODipine (NORVASC) 10 MG tablet Take 1 tablet (10 mg total) by mouth daily. 90 tablet 3  . aspirin 81 MG tablet Take 81 mg by mouth daily.    . calcium-vitamin D (OSCAL WITH D) 500-200 MG-UNIT tablet Take 1 tablet by mouth 2 (two) times daily. 60 tablet 3  . carvedilol (COREG) 12.5 MG tablet TAKE 1 TABLET BY MOUTH TWICE DAILY WITH A MEAL 180 tablet 3  . lidocaine-prilocaine (EMLA) cream Apply 1 application topically as needed. 30 g 0  . Multiple Vitamin (MULTIVITAMIN) tablet Take 1 tablet by mouth daily.    Marland Kitchen omeprazole (PRILOSEC) 20 MG capsule TAKE 1 CAPSULE BY MOUTH DAILY 30 capsule 0  . potassium chloride SA (K-DUR,KLOR-CON) 20 MEQ tablet TAKE 1 TABLET(20 MEQ) BY MOUTH DAILY 90 tablet 0  . prochlorperazine (COMPAZINE) 10 MG tablet TAKE 1 TABLET BY MOUTH EVERY 6 HOURS AS NEEDED FOR NAUSEA OR VOMITING 368 tablet 3  . sildenafil (VIAGRA) 100 MG tablet Take 1 tablet (100 mg total) by mouth daily as needed for erectile dysfunction. 30 tablet 3  . simvastatin (ZOCOR) 20 MG tablet  TAKE 1 TABLET BY MOUTH EVERY DAY 90 tablet 3   No current facility-administered medications for this visit.      Allergies:  Allergies  Allergen Reactions  . Lisinopril Swelling    Facial swelling  . Mango Flavor   . Peanut-Containing Drug Products     Past Medical History, Surgical history, Social history, and Family History reviewed and unchanged at this time.    Physical Exam:   Blood pressure 121/71, pulse 85, temperature 99.1 F (37.3 C), temperature source Oral, resp. rate 18, height  5\' 9"  (1.753 m), weight 223 lb 9.6 oz (101.4 kg), SpO2 98 %.     ECOG: 0   General appearance: Alert, awake without any distress. Head: Atraumatic without abnormalities Oropharynx: Without any thrush or ulcers. Eyes: No scleral icterus. Lymph nodes: No lymphadenopathy noted in the cervical, supraclavicular, or axillary nodes Heart:regular rate and rhythm, without any murmurs or gallops.   Lung: Clear to auscultation without any rhonchi, wheezes or dullness to percussion. Abdomin: Soft, nontender without any shifting dullness or ascites. Musculoskeletal: No clubbing or cyanosis. Neurological: No motor or sensory deficits. Skin: No rashes or lesions.        Lab Results: Lab Results  Component Value Date   WBC 9.8 07/02/2018   HGB 9.5 (L) 07/02/2018   HCT 30.0 (L) 07/02/2018   MCV 91.7 07/02/2018   PLT 401 (H) 07/02/2018     Chemistry      Component Value Date/Time   NA 140 07/02/2018 1218   NA 141 08/08/2017 1116   K 3.8 07/02/2018 1218   K 3.6 08/08/2017 1116   CL 107 07/02/2018 1218   CO2 22 07/02/2018 1218   CO2 24 08/08/2017 1116   BUN 12 07/02/2018 1218   BUN 13.2 08/08/2017 1116   CREATININE 0.96 07/02/2018 1218   CREATININE 1.1 08/08/2017 1116      Component Value Date/Time   CALCIUM 8.7 (L) 07/02/2018 1218   CALCIUM 9.1 08/08/2017 1116   ALKPHOS 163 (H) 07/02/2018 1218   ALKPHOS 214 (H) 08/08/2017 1116   AST 13 (L) 07/02/2018 1218   AST 17 08/08/2017 1116   ALT 11 07/02/2018 1218   ALT 17 08/08/2017 1116   BILITOT 0.3 07/02/2018 1218   BILITOT 0.29 08/08/2017 1116       Results for CALYB, MCQUARRIE (MRN [id number]) as of 07/23/2018 09:29  Ref. Range 06/11/2018 08:00 07/02/2018 12:18  Prostate Specific Ag, Serum Latest Ref Range: 0.0 - 4.0 ng/mL 75.8 (H) 80.4 (H)     Impression and Plan:  72 year old man with:   1.  Advanced prostate cancer with disease to the bone diagnosed in 2017.  He is currently castration-resistant status.  He is  currently on Taxotere chemotherapy and has tolerated it well.  His PSA did show reasonable decline although after the last cycle went up slightly to 80.4.  The natural course of his disease and risks and benefits of continuing chemotherapy was discussed today.  Given the overall reasonable response to his treatment as well as tolerance I recommended continuing at this time with a goal of getting 10 cycles of Taxotere depending on his tolerance.  Is agreeable to do so at this time.  We will continue to monitor his PSA closely and assess for need for possible salvage therapy if needed to in the future.    2. Androgen depravation: He received Lupron in October and will be repeated every 4 months.  He is agreeable to continue to do so.  Long-term complications associated with this therapy was reviewed again which include sexual dysfunction osteoporosis among others.  3.  Anemia: Hemoglobin remains stable since her last visit and does not require any packed red cell transfusion.  This was discussed with him today as a possible need in the future.  His anemia is related to malignancy and chemotherapy.  4.  Goals of care: The goal of care remains palliative but his performance status and activity level remain excellent and aggressive therapy is warranted.  5.  Bone directed therapy: He is on calcium and vitamin D supplements for the time being.  Delton See has been deferred until dental clearance is obtained.  6.  IV access: No issues reported with the Port-A-Cath.  Continues to be in use without any issues.  7.  Antiemetics: Compazine is available to him but no issues reported with nausea and vomiting.  8. Follow-up: in 3 weeks for the next cycle of chemotherapy.  25  minutes was spent with the patient face-to-face today.  More than 50% of time was dedicated to reviewing the natural course of his disease, treatment options and managing complications related to therapy.  Zola Button, MD 11/13/20199:28 AM

## 2018-07-23 NOTE — Patient Instructions (Signed)
[facility] Discharge Instructions for Patients Receiving Chemotherapy  Today you received the following chemotherapy agents: Docetaxel (Taxotere)  To help prevent nausea and vomiting after your treatment, we encourage you to take your nausea medication as directed.    If you develop nausea and vomiting that is not controlled by your nausea medication, call the clinic.   BELOW ARE SYMPTOMS THAT SHOULD BE REPORTED IMMEDIATELY:  *FEVER GREATER THAN 100.5 F  *CHILLS WITH OR WITHOUT FEVER  NAUSEA AND VOMITING THAT IS NOT CONTROLLED WITH YOUR NAUSEA MEDICATION  *UNUSUAL SHORTNESS OF BREATH  *UNUSUAL BRUISING OR BLEEDING  TENDERNESS IN MOUTH AND THROAT WITH OR WITHOUT PRESENCE OF ULCERS  *URINARY PROBLEMS  *BOWEL PROBLEMS  UNUSUAL RASH Items with * indicate a potential emergency and should be followed up as soon as possible.  Feel free to call the clinic should you have any questions or concerns. The clinic phone number is [phone].  Please show the CHEMO ALERT CARD at check-in to the Emergency Department and triage nurse.   

## 2018-07-24 ENCOUNTER — Telehealth: Payer: Self-pay

## 2018-07-24 LAB — PROSTATE-SPECIFIC AG, SERUM (LABCORP): Prostate Specific Ag, Serum: 68.8 ng/mL — ABNORMAL HIGH (ref 0.0–4.0)

## 2018-07-24 NOTE — Telephone Encounter (Signed)
-----   Message from Wyatt Portela, MD sent at 07/24/2018  7:24 AM EST ----- Please let him know his PSA is down.

## 2018-07-24 NOTE — Telephone Encounter (Signed)
Contacted patient and made aware of results. 

## 2018-07-25 ENCOUNTER — Inpatient Hospital Stay: Payer: Medicare Other

## 2018-07-25 VITALS — BP 136/71 | HR 100 | Temp 98.3°F | Resp 18

## 2018-07-25 DIAGNOSIS — C7951 Secondary malignant neoplasm of bone: Secondary | ICD-10-CM | POA: Diagnosis not present

## 2018-07-25 DIAGNOSIS — C61 Malignant neoplasm of prostate: Secondary | ICD-10-CM | POA: Diagnosis not present

## 2018-07-25 DIAGNOSIS — Z79899 Other long term (current) drug therapy: Secondary | ICD-10-CM | POA: Diagnosis not present

## 2018-07-25 DIAGNOSIS — Z5111 Encounter for antineoplastic chemotherapy: Secondary | ICD-10-CM | POA: Diagnosis not present

## 2018-07-25 DIAGNOSIS — D649 Anemia, unspecified: Secondary | ICD-10-CM | POA: Diagnosis not present

## 2018-07-25 MED ORDER — PEGFILGRASTIM-CBQV 6 MG/0.6ML ~~LOC~~ SOSY
6.0000 mg | PREFILLED_SYRINGE | Freq: Once | SUBCUTANEOUS | Status: AC
  Administered 2018-07-25: 6 mg via SUBCUTANEOUS

## 2018-07-25 MED ORDER — PEGFILGRASTIM-CBQV 6 MG/0.6ML ~~LOC~~ SOSY
PREFILLED_SYRINGE | SUBCUTANEOUS | Status: AC
  Filled 2018-07-25: qty 0.6

## 2018-07-31 DIAGNOSIS — H2511 Age-related nuclear cataract, right eye: Secondary | ICD-10-CM | POA: Diagnosis not present

## 2018-07-31 DIAGNOSIS — H5203 Hypermetropia, bilateral: Secondary | ICD-10-CM | POA: Diagnosis not present

## 2018-08-13 ENCOUNTER — Inpatient Hospital Stay: Payer: Medicare Other

## 2018-08-13 ENCOUNTER — Telehealth: Payer: Self-pay

## 2018-08-13 ENCOUNTER — Inpatient Hospital Stay: Payer: Medicare Other | Attending: Oncology

## 2018-08-13 ENCOUNTER — Inpatient Hospital Stay (HOSPITAL_BASED_OUTPATIENT_CLINIC_OR_DEPARTMENT_OTHER): Payer: Medicare Other | Admitting: Oncology

## 2018-08-13 VITALS — BP 129/73 | HR 86 | Temp 98.5°F | Resp 18 | Ht 69.0 in | Wt 228.7 lb

## 2018-08-13 DIAGNOSIS — D630 Anemia in neoplastic disease: Secondary | ICD-10-CM | POA: Diagnosis not present

## 2018-08-13 DIAGNOSIS — C7951 Secondary malignant neoplasm of bone: Secondary | ICD-10-CM | POA: Diagnosis not present

## 2018-08-13 DIAGNOSIS — C61 Malignant neoplasm of prostate: Secondary | ICD-10-CM | POA: Diagnosis not present

## 2018-08-13 DIAGNOSIS — Z23 Encounter for immunization: Secondary | ICD-10-CM

## 2018-08-13 DIAGNOSIS — Z79899 Other long term (current) drug therapy: Secondary | ICD-10-CM | POA: Diagnosis not present

## 2018-08-13 DIAGNOSIS — Z5111 Encounter for antineoplastic chemotherapy: Secondary | ICD-10-CM | POA: Insufficient documentation

## 2018-08-13 DIAGNOSIS — Z95828 Presence of other vascular implants and grafts: Secondary | ICD-10-CM

## 2018-08-13 LAB — CBC WITH DIFFERENTIAL (CANCER CENTER ONLY)
Abs Immature Granulocytes: 0.02 10*3/uL (ref 0.00–0.07)
Basophils Absolute: 0 10*3/uL (ref 0.0–0.1)
Basophils Relative: 1 %
Eosinophils Absolute: 0.2 10*3/uL (ref 0.0–0.5)
Eosinophils Relative: 4 %
HCT: 30.6 % — ABNORMAL LOW (ref 39.0–52.0)
Hemoglobin: 9.6 g/dL — ABNORMAL LOW (ref 13.0–17.0)
Immature Granulocytes: 0 %
Lymphocytes Relative: 27 %
Lymphs Abs: 1.6 10*3/uL (ref 0.7–4.0)
MCH: 29.3 pg (ref 26.0–34.0)
MCHC: 31.4 g/dL (ref 30.0–36.0)
MCV: 93.3 fL (ref 80.0–100.0)
Monocytes Absolute: 0.9 10*3/uL (ref 0.1–1.0)
Monocytes Relative: 15 %
Neutro Abs: 3 10*3/uL (ref 1.7–7.7)
Neutrophils Relative %: 53 %
Platelet Count: 383 10*3/uL (ref 150–400)
RBC: 3.28 MIL/uL — ABNORMAL LOW (ref 4.22–5.81)
RDW: 19 % — ABNORMAL HIGH (ref 11.5–15.5)
WBC Count: 5.8 10*3/uL (ref 4.0–10.5)
nRBC: 0 % (ref 0.0–0.2)

## 2018-08-13 LAB — CMP (CANCER CENTER ONLY)
ALT: 11 U/L (ref 0–44)
AST: 14 U/L — ABNORMAL LOW (ref 15–41)
Albumin: 3.3 g/dL — ABNORMAL LOW (ref 3.5–5.0)
Alkaline Phosphatase: 118 U/L (ref 38–126)
Anion gap: 7 (ref 5–15)
BUN: 11 mg/dL (ref 8–23)
CO2: 23 mmol/L (ref 22–32)
Calcium: 8.4 mg/dL — ABNORMAL LOW (ref 8.9–10.3)
Chloride: 111 mmol/L (ref 98–111)
Creatinine: 0.98 mg/dL (ref 0.61–1.24)
GFR, Est AFR Am: >60 mL/min (ref >60)
GFR, Estimated: >60 mL/min (ref >60)
Glucose, Bld: 115 mg/dL — ABNORMAL HIGH (ref 70–99)
Potassium: 4.2 mmol/L (ref 3.5–5.1)
Sodium: 141 mmol/L (ref 135–145)
Total Bilirubin: 0.4 mg/dL (ref 0.3–1.2)
Total Protein: 6.1 g/dL — ABNORMAL LOW (ref 6.5–8.1)

## 2018-08-13 MED ORDER — SODIUM CHLORIDE 0.9% FLUSH
10.0000 mL | INTRAVENOUS | Status: DC | PRN
  Administered 2018-08-13: 10 mL
  Filled 2018-08-13: qty 10

## 2018-08-13 MED ORDER — INFLUENZA VAC SPLIT QUAD 0.5 ML IM SUSY
0.5000 mL | PREFILLED_SYRINGE | Freq: Once | INTRAMUSCULAR | Status: AC
  Administered 2018-08-13: 0.5 mL via INTRAMUSCULAR

## 2018-08-13 MED ORDER — SODIUM CHLORIDE 0.9% FLUSH
10.0000 mL | INTRAVENOUS | Status: DC | PRN
  Administered 2018-08-13: 10 mL via INTRAVENOUS
  Filled 2018-08-13: qty 10

## 2018-08-13 MED ORDER — HEPARIN SOD (PORK) LOCK FLUSH 100 UNIT/ML IV SOLN
500.0000 [IU] | Freq: Once | INTRAVENOUS | Status: AC | PRN
  Administered 2018-08-13: 500 [IU]
  Filled 2018-08-13: qty 5

## 2018-08-13 MED ORDER — INFLUENZA VAC SPLIT QUAD 0.5 ML IM SUSY
PREFILLED_SYRINGE | INTRAMUSCULAR | Status: AC
  Filled 2018-08-13: qty 0.5

## 2018-08-13 MED ORDER — DEXAMETHASONE SODIUM PHOSPHATE 10 MG/ML IJ SOLN
10.0000 mg | Freq: Once | INTRAMUSCULAR | Status: AC
  Administered 2018-08-13: 10 mg via INTRAVENOUS

## 2018-08-13 MED ORDER — DEXAMETHASONE SODIUM PHOSPHATE 10 MG/ML IJ SOLN
INTRAMUSCULAR | Status: AC
  Filled 2018-08-13: qty 1

## 2018-08-13 MED ORDER — SODIUM CHLORIDE 0.9 % IV SOLN
Freq: Once | INTRAVENOUS | Status: AC
  Administered 2018-08-13: 14:00:00 via INTRAVENOUS
  Filled 2018-08-13: qty 250

## 2018-08-13 MED ORDER — SODIUM CHLORIDE 0.9 % IV SOLN
75.0000 mg/m2 | Freq: Once | INTRAVENOUS | Status: AC
  Administered 2018-08-13: 160 mg via INTRAVENOUS
  Filled 2018-08-13: qty 16

## 2018-08-13 NOTE — Progress Notes (Signed)
Hematology and Oncology Follow Up Visit  Dennis Fischer [id number] 1945/10/15 72 y.o. 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  He was found to have Gleason score of 7 and a PSA of 12.8  in 2016.    Prior Therapy: He received definitive therapy using radiation therapy and androgen deprivation in 2016.  He developed advanced disease with pelvic adenopathy and was treated with androgen deprivation while was living in [place].  He subsequently developed castration resistant disease.  Zytiga 1000 mg daily restarted on 03/14/2017.  Therapy discontinued in August 2018 due to progression of disease.  Current therapy:   Lupron 30 mg every 4 months.    Taxotere chemotherapy at 75 mg/m given every 3 weeks started on the first 2019.  He is here for cycle 6 of therapy.   Interim History:  [name] presents today for a repeat evaluation.  Since the last visit, he continues to tolerate chemotherapy without any recent decline.  He denies any fatigue or tiredness.  He denies any dyspepsia or abdominal distention.  He does report mild fatigue but overall remains active with excellent appetite.  Denies any worsening bone pain or neurological deficits.  He denies any worsening neuropathy.  His quality of life remains maintained.  He does not report any headaches, blurry vision, syncope or seizures.  He denies any dizziness or confusion.  He does not report any fevers, chills or sweats.  He does not report any cough, wheezing or hemoptysis. He does not report any chest pain, palpitation, orthopnea.  Denies any nausea, vomiting or early satiety.  He denies any constipation or diarrhea.  He does not report any frequency urgency or hesitancy.  He does not report any bleeding or clotting tendency.  He does not report any skin rashes or lesions.  He denies any anxiety or  depression.  He denies any arthralgias or myalgias.  Remaining review of systems is negative.   Medications: I have reviewed the patient's current medications.  Current Outpatient Medications  Medication Sig Dispense Refill  . Acetaminophen (TYLENOL PO) Take 500 mg by mouth daily as needed.    Marland Kitchen amLODipine (NORVASC) 10 MG tablet Take 1 tablet (10 mg total) by mouth daily. 90 tablet 3  . aspirin 81 MG tablet Take 81 mg by mouth daily.    . calcium-vitamin D (OSCAL WITH D) 500-200 MG-UNIT tablet Take 1 tablet by mouth 2 (two) times daily. 60 tablet 3  . carvedilol (COREG) 12.5 MG tablet TAKE 1 TABLET BY MOUTH TWICE DAILY WITH A MEAL 180 tablet 3  . lidocaine-prilocaine (EMLA) cream Apply 1 application topically as needed. 30 g 0  . Multiple Vitamin (MULTIVITAMIN) tablet Take 1 tablet by mouth daily.    Marland Kitchen omeprazole (PRILOSEC) 20 MG capsule TAKE 1 CAPSULE BY MOUTH DAILY 30 capsule 0  . potassium chloride SA (K-DUR,KLOR-CON) 20 MEQ tablet TAKE 1 TABLET(20 MEQ) BY MOUTH DAILY 90 tablet 0  . prochlorperazine (COMPAZINE) 10 MG tablet TAKE 1 TABLET BY MOUTH EVERY 6 HOURS AS NEEDED FOR NAUSEA OR VOMITING 368 tablet 3  . sildenafil (VIAGRA) 100 MG tablet Take 1 tablet (100 mg total) by mouth daily as needed for erectile dysfunction. 30 tablet 3  . simvastatin (ZOCOR) 20 MG tablet TAKE 1 TABLET BY MOUTH EVERY DAY 90 tablet 3   No current facility-administered medications for this visit.    Facility-Administered Medications Ordered  in Other Visits  Medication Dose Route Frequency Provider Last Rate Last Dose  . sodium chloride flush (NS) 0.9 % injection 10 mL  10 mL Intravenous PRN Wyatt Portela, MD   10 mL at 08/13/18 1219     Allergies:  Allergies  Allergen Reactions  . Lisinopril Swelling    Facial swelling  . Mango Flavor   . Peanut-Containing Drug Products     Past Medical History, Surgical history, Social history, and Family History reviewed and unchanged at this time.    Physical  Exam:   Blood pressure 129/73, pulse 86, temperature 98.5 F (36.9 C), temperature source Oral, resp. rate 18, height 5\' 9"  (1.753 m), weight 228 lb 11.2 oz (103.7 kg), SpO2 100 %.  ECOG: 0   General appearance: Comfortable appearing without any discomfort Head: Normocephalic without any trauma Oropharynx: Mucous membranes are moist and pink without any thrush or ulcers. Eyes: Pupils are equal and round reactive to light. Lymph nodes: No cervical, supraclavicular, inguinal or axillary lymphadenopathy.   Heart:regular rate and rhythm.  S1 and S2 without leg edema. Lung: Clear without any rhonchi or wheezes.  No dullness to percussion. Abdomin: Soft, nontender, nondistended with good bowel sounds.  No hepatosplenomegaly. Musculoskeletal: No joint deformity or effusion.  Full range of motion noted. Neurological: No deficits noted on motor, sensory and deep tendon reflex exam. Skin: No petechial rash or dryness.  Appeared moist.  Psychiatric: Mood and affect appeared appropriate.         Lab Results: Lab Results  Component Value Date   WBC 6.1 07/23/2018   HGB 9.6 (L) 07/23/2018   HCT 30.6 (L) 07/23/2018   MCV 93.3 07/23/2018   PLT 341 07/23/2018     Chemistry      Component Value Date/Time   NA 141 07/23/2018 0905   NA 141 08/08/2017 1116   K 4.0 07/23/2018 0905   K 3.6 08/08/2017 1116   CL 110 07/23/2018 0905   CO2 23 07/23/2018 0905   CO2 24 08/08/2017 1116   BUN 14 07/23/2018 0905   BUN 13.2 08/08/2017 1116   CREATININE 1.02 07/23/2018 0905   CREATININE 1.1 08/08/2017 1116      Component Value Date/Time   CALCIUM 8.6 (L) 07/23/2018 0905   CALCIUM 9.1 08/08/2017 1116   ALKPHOS 141 (H) 07/23/2018 0905   ALKPHOS 214 (H) 08/08/2017 1116   AST 14 (L) 07/23/2018 0905   AST 17 08/08/2017 1116   ALT 10 07/23/2018 0905   ALT 17 08/08/2017 1116   BILITOT 0.4 07/23/2018 0905   BILITOT 0.29 08/08/2017 1116      Results for QUANTARIUS, GENRICH (MRN [id number]) as of  08/13/2018 12:20  Ref. Range 05/21/2018 11:59 06/11/2018 08:00 07/02/2018 12:18 07/23/2018 09:05  Prostate Specific Ag, Serum Latest Ref Range: 0.0 - 4.0 ng/mL 112.0 (H) 75.8 (H) 80.4 (H) 68.8 (H)       Impression and Plan:  72 year old man with:   1.  Castration-resistant prostate cancer with disease to the bone diagnosed in 2017.    He remains on Taxotere chemotherapy without any major complications.  His PSA continues to show excellent response currently with close to 50% decline after 5 cycles.  Risks and benefits of continuing this therapy was reviewed and is agreeable to continue.  The goal is to get close to 10 cycles of therapy.    2. Androgen depravation: He remains on Lupron which will be repeated every 4 months.  Last injection was given in October  2019.  Long-term complication associated with this therapy was reiterated at this time.  He is agreeable to continue at this time.  3.  Anemia: Related to chemotherapy and malignancy.  Hemoglobin remains stable and does not require any transfusion.  4.  Goals of care: Therapy remains palliative but his performance status remains excellent and aggressive therapy is warranted.  5.  Bone directed therapy: Delton See has been deferred until obtaining dental clearance.  He is on calcium and vitamin D supplement.  6.  IV access: Port-A-Cath remains in place without any issues.  This remains in use for chemotherapy.  7.  Antiemetics: No issues reported with nausea and vomiting at this time.  Compazine is available to him.  8. Follow-up: in 3 weeks for the next cycle of chemotherapy.  25  minutes was spent with the patient face-to-face today.  More than 50% of time was dedicated to updating his disease status, laboratory review and answering questions regarding future plan of care.  Zola Button, MD 12/4/201912:30 PM

## 2018-08-13 NOTE — Patient Instructions (Signed)
[facility] Discharge Instructions for Patients Receiving Chemotherapy  Today you received the following chemotherapy agents: Taxotere  To help prevent nausea and vomiting after your treatment, we encourage you to take your nausea medication as directed.    If you develop nausea and vomiting that is not controlled by your nausea medication, call the clinic.   BELOW ARE SYMPTOMS THAT SHOULD BE REPORTED IMMEDIATELY:  *FEVER GREATER THAN 100.5 F  *CHILLS WITH OR WITHOUT FEVER  NAUSEA AND VOMITING THAT IS NOT CONTROLLED WITH YOUR NAUSEA MEDICATION  *UNUSUAL SHORTNESS OF BREATH  *UNUSUAL BRUISING OR BLEEDING  TENDERNESS IN MOUTH AND THROAT WITH OR WITHOUT PRESENCE OF ULCERS  *URINARY PROBLEMS  *BOWEL PROBLEMS  UNUSUAL RASH Items with * indicate a potential emergency and should be followed up as soon as possible.  Feel free to call the clinic should you have any questions or concerns. The clinic phone number is [phone].  Please show the CHEMO ALERT CARD at check-in to the Emergency Department and triage nurse.   

## 2018-08-13 NOTE — Telephone Encounter (Signed)
Printed avs and calender of upcoming appointment. Per 12/4 los 

## 2018-08-14 ENCOUNTER — Telehealth: Payer: Self-pay | Admitting: *Deleted

## 2018-08-14 LAB — PROSTATE-SPECIFIC AG, SERUM (LABCORP): Prostate Specific Ag, Serum: 54 ng/mL — ABNORMAL HIGH (ref 0.0–4.0)

## 2018-08-14 NOTE — Telephone Encounter (Signed)
-----   Message from Wyatt Portela, MD sent at 08/14/2018  7:58 AM EST ----- Please let him know his PSA is lower.

## 2018-08-14 NOTE — Telephone Encounter (Signed)
Spoke with patient, gave results of last PSA 

## 2018-08-15 ENCOUNTER — Inpatient Hospital Stay: Payer: Medicare Other

## 2018-08-15 VITALS — HR 93 | Temp 99.3°F | Resp 16

## 2018-08-15 DIAGNOSIS — C7951 Secondary malignant neoplasm of bone: Secondary | ICD-10-CM | POA: Diagnosis not present

## 2018-08-15 DIAGNOSIS — C61 Malignant neoplasm of prostate: Secondary | ICD-10-CM

## 2018-08-15 DIAGNOSIS — Z79899 Other long term (current) drug therapy: Secondary | ICD-10-CM | POA: Diagnosis not present

## 2018-08-15 DIAGNOSIS — D630 Anemia in neoplastic disease: Secondary | ICD-10-CM | POA: Diagnosis not present

## 2018-08-15 DIAGNOSIS — Z5111 Encounter for antineoplastic chemotherapy: Secondary | ICD-10-CM | POA: Diagnosis not present

## 2018-08-15 DIAGNOSIS — Z23 Encounter for immunization: Secondary | ICD-10-CM | POA: Diagnosis not present

## 2018-08-15 MED ORDER — PEGFILGRASTIM-CBQV 6 MG/0.6ML ~~LOC~~ SOSY
PREFILLED_SYRINGE | SUBCUTANEOUS | Status: AC
  Filled 2018-08-15: qty 0.6

## 2018-08-15 MED ORDER — PEGFILGRASTIM-CBQV 6 MG/0.6ML ~~LOC~~ SOSY
6.0000 mg | PREFILLED_SYRINGE | Freq: Once | SUBCUTANEOUS | Status: AC
  Administered 2018-08-15: 6 mg via SUBCUTANEOUS

## 2018-08-15 NOTE — Patient Instructions (Addendum)
Pegfilgrastim injection What is this medicine? PEGFILGRASTIM (PEG fil gra stim) is a long-acting granulocyte colony-stimulating factor that stimulates the growth of neutrophils, a type of white blood cell important in the body's fight against infection. It is used to reduce the incidence of fever and infection in patients with certain types of cancer who are receiving chemotherapy that affects the bone marrow, and to increase survival after being exposed to high doses of radiation. This medicine may be used for other purposes; ask your health care provider or pharmacist if you have questions. COMMON BRAND NAME(S): Neulasta, Udenyca What should I tell my health care provider before I take this medicine? They need to know if you have any of these conditions: -kidney disease -latex allergy -ongoing radiation therapy -sickle cell disease -skin reactions to acrylic adhesives (On-Body Injector only) -an unusual or allergic reaction to pegfilgrastim, filgrastim, other medicines, foods, dyes, or preservatives -pregnant or trying to get pregnant -breast-feeding How should I use this medicine? This medicine is for injection under the skin. If you get this medicine at home, you will be taught how to prepare and give the pre-filled syringe or how to use the On-body Injector. Refer to the patient Instructions for Use for detailed instructions. Use exactly as directed. Tell your healthcare provider immediately if you suspect that the On-body Injector may not have performed as intended or if you suspect the use of the On-body Injector resulted in a missed or partial dose. It is important that you put your used needles and syringes in a special sharps container. Do not put them in a trash can. If you do not have a sharps container, call your pharmacist or healthcare provider to get one. Talk to your pediatrician regarding the use of this medicine in children. While this drug may be prescribed for selected  conditions, precautions do apply. Overdosage: If you think you have taken too much of this medicine contact a poison control center or emergency room at once. NOTE: This medicine is only for you. Do not share this medicine with others. What if I miss a dose? It is important not to miss your dose. Call your doctor or health care professional if you miss your dose. If you miss a dose due to an On-body Injector failure or leakage, a new dose should be administered as soon as possible using a single prefilled syringe for manual use. What may interact with this medicine? Interactions have not been studied. Give your health care provider a list of all the medicines, herbs, non-prescription drugs, or dietary supplements you use. Also tell them if you smoke, drink alcohol, or use illegal drugs. Some items may interact with your medicine. This list may not describe all possible interactions. Give your health care provider a list of all the medicines, herbs, non-prescription drugs, or dietary supplements you use. Also tell them if you smoke, drink alcohol, or use illegal drugs. Some items may interact with your medicine. What should I watch for while using this medicine? You may need blood work done while you are taking this medicine. If you are going to need a MRI, CT scan, or other procedure, tell your doctor that you are using this medicine (On-Body Injector only). What side effects may I notice from receiving this medicine? Side effects that you should report to your doctor or health care professional as soon as possible: -allergic reactions like skin rash, itching or hives, swelling of the face, lips, or tongue -dizziness -fever -pain, redness, or irritation at  site where injected -pinpoint red spots on the skin -red or dark-brown urine -shortness of breath or breathing problems -stomach or side pain, or pain at the shoulder -swelling -tiredness -trouble passing urine or change in the amount of  urine Side effects that usually do not require medical attention (report to your doctor or health care professional if they continue or are bothersome): -bone pain -muscle pain This list may not describe all possible side effects. Call your doctor for medical advice about side effects. You may report side effects to FDA at 1-800-FDA-1088. Where should I keep my medicine? Keep out of the reach of children. Store pre-filled syringes in a refrigerator between 2 and 8 degrees C (36 and 46 degrees F). Do not freeze. Keep in carton to protect from light. Throw away this medicine if it is left out of the refrigerator for more than 48 hours. Throw away any unused medicine after the expiration date. NOTE: This sheet is a summary. It may not cover all possible information. If you have questions about this medicine, talk to your doctor, pharmacist, or health care provider.  2018 Elsevier/Gold Standard (2016-08-23 12:58:03) Ellen Henri

## 2018-08-19 ENCOUNTER — Other Ambulatory Visit: Payer: Self-pay | Admitting: Student in an Organized Health Care Education/Training Program

## 2018-08-19 DIAGNOSIS — C61 Malignant neoplasm of prostate: Secondary | ICD-10-CM

## 2018-09-05 ENCOUNTER — Inpatient Hospital Stay: Payer: Medicare Other

## 2018-09-05 ENCOUNTER — Telehealth: Payer: Self-pay

## 2018-09-05 ENCOUNTER — Inpatient Hospital Stay (HOSPITAL_BASED_OUTPATIENT_CLINIC_OR_DEPARTMENT_OTHER): Payer: Medicare Other | Admitting: Oncology

## 2018-09-05 VITALS — BP 121/65 | HR 92 | Temp 98.7°F | Resp 18 | Ht 69.0 in | Wt 238.0 lb

## 2018-09-05 DIAGNOSIS — C61 Malignant neoplasm of prostate: Secondary | ICD-10-CM

## 2018-09-05 DIAGNOSIS — D630 Anemia in neoplastic disease: Secondary | ICD-10-CM | POA: Diagnosis not present

## 2018-09-05 DIAGNOSIS — Z79899 Other long term (current) drug therapy: Secondary | ICD-10-CM

## 2018-09-05 DIAGNOSIS — Z5111 Encounter for antineoplastic chemotherapy: Secondary | ICD-10-CM | POA: Diagnosis not present

## 2018-09-05 DIAGNOSIS — C7951 Secondary malignant neoplasm of bone: Secondary | ICD-10-CM | POA: Diagnosis not present

## 2018-09-05 DIAGNOSIS — Z23 Encounter for immunization: Secondary | ICD-10-CM | POA: Diagnosis not present

## 2018-09-05 DIAGNOSIS — Z95828 Presence of other vascular implants and grafts: Secondary | ICD-10-CM

## 2018-09-05 LAB — CMP (CANCER CENTER ONLY)
ALT: 13 U/L (ref 0–44)
AST: 15 U/L (ref 15–41)
Albumin: 3.1 g/dL — ABNORMAL LOW (ref 3.5–5.0)
Alkaline Phosphatase: 101 U/L (ref 38–126)
Anion gap: 7 (ref 5–15)
BUN: 16 mg/dL (ref 8–23)
CO2: 23 mmol/L (ref 22–32)
Calcium: 8.3 mg/dL — ABNORMAL LOW (ref 8.9–10.3)
Chloride: 112 mmol/L — ABNORMAL HIGH (ref 98–111)
Creatinine: 0.9 mg/dL (ref 0.61–1.24)
GFR, Est AFR Am: >60 mL/min (ref >60)
GFR, Estimated: >60 mL/min (ref >60)
Glucose, Bld: 114 mg/dL — ABNORMAL HIGH (ref 70–99)
Potassium: 4.2 mmol/L (ref 3.5–5.1)
Sodium: 142 mmol/L (ref 135–145)
Total Bilirubin: 0.3 mg/dL (ref 0.3–1.2)
Total Protein: 5.7 g/dL — ABNORMAL LOW (ref 6.5–8.1)

## 2018-09-05 LAB — CBC WITH DIFFERENTIAL (CANCER CENTER ONLY)
Abs Immature Granulocytes: 0.02 10*3/uL (ref 0.00–0.07)
Basophils Absolute: 0 10*3/uL (ref 0.0–0.1)
Basophils Relative: 1 %
Eosinophils Absolute: 0.4 10*3/uL (ref 0.0–0.5)
Eosinophils Relative: 6 %
HCT: 28.8 % — ABNORMAL LOW (ref 39.0–52.0)
Hemoglobin: 9.1 g/dL — ABNORMAL LOW (ref 13.0–17.0)
Immature Granulocytes: 0 %
Lymphocytes Relative: 24 %
Lymphs Abs: 1.6 10*3/uL (ref 0.7–4.0)
MCH: 29.6 pg (ref 26.0–34.0)
MCHC: 31.6 g/dL (ref 30.0–36.0)
MCV: 93.8 fL (ref 80.0–100.0)
Monocytes Absolute: 0.9 10*3/uL (ref 0.1–1.0)
Monocytes Relative: 13 %
Neutro Abs: 3.8 10*3/uL (ref 1.7–7.7)
Neutrophils Relative %: 56 %
Platelet Count: 333 10*3/uL (ref 150–400)
RBC: 3.07 MIL/uL — ABNORMAL LOW (ref 4.22–5.81)
RDW: 18.6 % — ABNORMAL HIGH (ref 11.5–15.5)
WBC Count: 6.6 10*3/uL (ref 4.0–10.5)
nRBC: 0 % (ref 0.0–0.2)

## 2018-09-05 MED ORDER — SODIUM CHLORIDE 0.9 % IV SOLN
Freq: Once | INTRAVENOUS | Status: AC
  Administered 2018-09-05: 09:00:00 via INTRAVENOUS
  Filled 2018-09-05: qty 250

## 2018-09-05 MED ORDER — DEXAMETHASONE SODIUM PHOSPHATE 10 MG/ML IJ SOLN
10.0000 mg | Freq: Once | INTRAMUSCULAR | Status: AC
  Administered 2018-09-05: 10 mg via INTRAVENOUS

## 2018-09-05 MED ORDER — SODIUM CHLORIDE 0.9% FLUSH
10.0000 mL | Freq: Once | INTRAVENOUS | Status: AC
  Administered 2018-09-05: 10 mL
  Filled 2018-09-05: qty 10

## 2018-09-05 MED ORDER — SODIUM CHLORIDE 0.9% FLUSH
10.0000 mL | INTRAVENOUS | Status: DC | PRN
  Administered 2018-09-05: 10 mL
  Filled 2018-09-05: qty 10

## 2018-09-05 MED ORDER — HEPARIN SOD (PORK) LOCK FLUSH 100 UNIT/ML IV SOLN
500.0000 [IU] | Freq: Once | INTRAVENOUS | Status: AC | PRN
  Administered 2018-09-05: 500 [IU]
  Filled 2018-09-05: qty 5

## 2018-09-05 MED ORDER — SODIUM CHLORIDE 0.9 % IV SOLN
75.0000 mg/m2 | Freq: Once | INTRAVENOUS | Status: AC
  Administered 2018-09-05: 160 mg via INTRAVENOUS
  Filled 2018-09-05: qty 16

## 2018-09-05 MED ORDER — DEXAMETHASONE SODIUM PHOSPHATE 10 MG/ML IJ SOLN
INTRAMUSCULAR | Status: AC
  Filled 2018-09-05: qty 1

## 2018-09-05 NOTE — Patient Instructions (Signed)
[facility] Discharge Instructions for Patients Receiving Chemotherapy  Today you received the following chemotherapy agents: Docetaxel (Taxotere)  To help prevent nausea and vomiting after your treatment, we encourage you to take your nausea medication as directed.    If you develop nausea and vomiting that is not controlled by your nausea medication, call the clinic.   BELOW ARE SYMPTOMS THAT SHOULD BE REPORTED IMMEDIATELY:  *FEVER GREATER THAN 100.5 F  *CHILLS WITH OR WITHOUT FEVER  NAUSEA AND VOMITING THAT IS NOT CONTROLLED WITH YOUR NAUSEA MEDICATION  *UNUSUAL SHORTNESS OF BREATH  *UNUSUAL BRUISING OR BLEEDING  TENDERNESS IN MOUTH AND THROAT WITH OR WITHOUT PRESENCE OF ULCERS  *URINARY PROBLEMS  *BOWEL PROBLEMS  UNUSUAL RASH Items with * indicate a potential emergency and should be followed up as soon as possible.  Feel free to call the clinic should you have any questions or concerns. The clinic phone number is [phone].  Please show the CHEMO ALERT CARD at check-in to the Emergency Department and triage nurse.   

## 2018-09-05 NOTE — Telephone Encounter (Signed)
Per 12/27 no los 

## 2018-09-05 NOTE — Progress Notes (Signed)
Hematology and Oncology Follow Up Visit  Dennis Fischer [id number] [birth year] 72 y.o. 09/05/2018 8:12 AM Dennis Fischer, MDFeng, Dennis Purpura, MD   Principle Diagnosis: 72 year old man with advanced prostate cancer with disease to the bone diagnosed in 2018.  He is currently castration-resistant after presenting with Gleason score of 7 and a PSA of 12.8  in 2016.    Prior Therapy: He received definitive therapy using radiation therapy and androgen deprivation in 2016.  He developed advanced disease with pelvic adenopathy and was treated with androgen deprivation while was living in [place].  He subsequently developed castration resistant disease.  Zytiga 1000 mg daily restarted on 03/14/2017.  Therapy discontinued in August 2018 due to progression of disease.  Current therapy:   Lupron 30 mg every 4 months.    Taxotere chemotherapy at 75 mg/m given every 3 weeks started on the first 2019.  He completed 6 cycles of therapy and here for cycle 7.   Interim History:  Dennis Fischer returns today for a repeat evaluation.  Since the last visit, he reports no major changes in his health.  He is reporting increased lower extremity edema, excessive fatigue and tiredness.  He does report some mild dyspnea but no cough or shortness of breath.  He still ambulating without any major difficulties.  He denies any falls or syncope.  His appetite and performance status remains unchanged.  He does not report any headaches, blurry vision, syncope or seizures.  He denies any alteration in mental status or lethargy.  He does not report any fevers, chills or sweats.  He does not report any cough, wheezing or hemoptysis. He does not report any chest pain, palpitation, orthopnea.  Denies any nausea, vomiting or abdominal pain.  He denies any changes in bowel habits.  He does not report any frequency urgency or hesitancy.  He does not report any ecchymosis or petechiae.  He denies any mood changes.  He denies any bone pain or  pathological fractures.  Remaining review of systems is negative.   Medications: I have reviewed the patient's current medications.  Current Outpatient Medications  Medication Sig Dispense Refill  . Acetaminophen (TYLENOL PO) Take 500 mg by mouth daily as needed.    Marland Kitchen amLODipine (NORVASC) 10 MG tablet Take 1 tablet (10 mg total) by mouth daily. 90 tablet 3  . aspirin 81 MG tablet Take 81 mg by mouth daily.    . calcium-vitamin D (OSCAL WITH D) 500-200 MG-UNIT tablet Take 1 tablet by mouth 2 (two) times daily. 60 tablet 3  . carvedilol (COREG) 12.5 MG tablet TAKE 1 TABLET BY MOUTH TWICE DAILY WITH A MEAL 180 tablet 3  . lidocaine-prilocaine (EMLA) cream Apply 1 application topically as needed. 30 g 0  . Multiple Vitamin (MULTIVITAMIN) tablet Take 1 tablet by mouth daily.    Marland Kitchen omeprazole (PRILOSEC) 20 MG capsule TAKE 1 CAPSULE BY MOUTH DAILY 30 capsule 0  . potassium chloride SA (K-DUR,KLOR-CON) 20 MEQ tablet TAKE 1 TABLET(20 MEQ) BY MOUTH DAILY 90 tablet 0  . prochlorperazine (COMPAZINE) 10 MG tablet TAKE 1 TABLET BY MOUTH EVERY 6 HOURS AS NEEDED FOR NAUSEA OR VOMITING 368 tablet 3  . sildenafil (VIAGRA) 100 MG tablet Take 1 tablet (100 mg total) by mouth daily as needed for erectile dysfunction. 30 tablet 3  . simvastatin (ZOCOR) 20 MG tablet TAKE 1 TABLET BY MOUTH EVERY DAY 90 tablet 3   No current facility-administered medications for this visit.      Allergies:  Allergies  Allergen Reactions  . Lisinopril Swelling    Facial swelling  . Mango Flavor   . Peanut-Containing Drug Products     Past Medical History, Surgical history, Social history, and Family History reviewed and unchanged at this time.    Physical Exam:  Blood pressure 121/65, pulse 92, temperature 98.7 F (37.1 C), temperature source Oral, resp. rate 18, height 5\' 9"  (1.753 m), weight 238 lb (108 kg), SpO2 100 %.    ECOG: 1   General appearance: Alert, awake without any distress. Head: Atraumatic without  abnormalities Oropharynx: Without any thrush or ulcers. Eyes: No scleral icterus. Lymph nodes: No lymphadenopathy noted in the cervical, supraclavicular, or axillary nodes Heart:regular rate and rhythm, without any murmurs.  1+ edema noted bilaterally. Lung: Clear to auscultation without any rhonchi, wheezes or dullness to percussion. Abdomin: Soft, nontender without any shifting dullness or ascites. Musculoskeletal: No clubbing or cyanosis. Neurological: No motor or sensory deficits. Skin: No rashes or lesions. Psychiatric: Mood and affect appeared normal.          Lab Results: Lab Results  Component Value Date   WBC 5.8 08/13/2018   HGB 9.6 (L) 08/13/2018   HCT 30.6 (L) 08/13/2018   MCV 93.3 08/13/2018   PLT 383 08/13/2018     Chemistry      Component Value Date/Time   NA 141 08/13/2018 1126   NA 141 08/08/2017 1116   K 4.2 08/13/2018 1126   K 3.6 08/08/2017 1116   CL 111 08/13/2018 1126   CO2 23 08/13/2018 1126   CO2 24 08/08/2017 1116   BUN 11 08/13/2018 1126   BUN 13.2 08/08/2017 1116   CREATININE 0.98 08/13/2018 1126   CREATININE 1.1 08/08/2017 1116      Component Value Date/Time   CALCIUM 8.4 (L) 08/13/2018 1126   CALCIUM 9.1 08/08/2017 1116   ALKPHOS 118 08/13/2018 1126   ALKPHOS 214 (H) 08/08/2017 1116   AST 14 (L) 08/13/2018 1126   AST 17 08/08/2017 1116   ALT 11 08/13/2018 1126   ALT 17 08/08/2017 1116   BILITOT 0.4 08/13/2018 1126   BILITOT 0.29 08/08/2017 1116        Results for Dennis Fischer (MRN [id number]) as of 09/05/2018 08:08  Ref. Range 06/11/2018 08:00 07/02/2018 12:18 07/23/2018 09:05 08/13/2018 11:26  Prostate Specific Ag, Serum Latest Ref Range: 0.0 - 4.0 ng/mL 75.8 (H) 80.4 (H) 68.8 (H) 54.0 (H)      Impression and Plan:  72 year old man with:   1.  Advanced prostate cancer with disease to the bone there is currently castration-resistant documented in 2017.  He has tolerated Taxotere chemotherapy with continue with  excellent response with PSA criteria.  His PSA close to 50% down after 6 cycles of therapy.  Risks and benefits and complications associated with Taxotere chemotherapy were reviewed today.  Complications that include nausea, fatigue, myelosuppression and neuropathy were reiterated.  After discussion today is agreeable to continue although he is reaching a point where he would like to discontinue chemotherapy potentially after cycle 8.    2. Androgen depravation: He continues to be on Lupron long-term which I recommended to continue indefinitely.  3.  Anemia: Hemoglobin remains stable and minimally symptomatic.  No transfusion is needed.  4.  Goals of care: His disease remains incurable but aggressive therapy is warranted given his excellent performance status.  5.  Bone directed therapy: Bone directed therapy and has been deferred for the time being.  Still awaiting dental clearance.  6.  IV access: Port-A-Cath still in use without any issues.  This will be flushed periodically after conclusion of chemotherapy.  7.  Antiemetics: No nausea or vomiting reported at this time.  Compazine is available to him.  8. Follow-up: in 3 weeks for the next cycle of chemotherapy.  25  minutes was spent with the patient face-to-face today.  More than 50% of time was dedicated to reviewing his disease status, complications related to chemotherapy discussion and managing toxicities.  Zola Button, MD 12/27/20198:12 AM

## 2018-09-06 ENCOUNTER — Inpatient Hospital Stay: Payer: Medicare Other

## 2018-09-06 VITALS — BP 143/72 | HR 104 | Temp 98.4°F | Resp 17

## 2018-09-06 DIAGNOSIS — Z79899 Other long term (current) drug therapy: Secondary | ICD-10-CM | POA: Diagnosis not present

## 2018-09-06 DIAGNOSIS — Z5111 Encounter for antineoplastic chemotherapy: Secondary | ICD-10-CM | POA: Diagnosis not present

## 2018-09-06 DIAGNOSIS — Z23 Encounter for immunization: Secondary | ICD-10-CM | POA: Diagnosis not present

## 2018-09-06 DIAGNOSIS — C7951 Secondary malignant neoplasm of bone: Secondary | ICD-10-CM | POA: Diagnosis not present

## 2018-09-06 DIAGNOSIS — D630 Anemia in neoplastic disease: Secondary | ICD-10-CM | POA: Diagnosis not present

## 2018-09-06 DIAGNOSIS — C61 Malignant neoplasm of prostate: Secondary | ICD-10-CM

## 2018-09-06 LAB — PROSTATE-SPECIFIC AG, SERUM (LABCORP): Prostate Specific Ag, Serum: 43.9 ng/mL — ABNORMAL HIGH (ref 0.0–4.0)

## 2018-09-06 MED ORDER — PEGFILGRASTIM-CBQV 6 MG/0.6ML ~~LOC~~ SOSY
6.0000 mg | PREFILLED_SYRINGE | Freq: Once | SUBCUTANEOUS | Status: AC
  Administered 2018-09-06: 6 mg via SUBCUTANEOUS

## 2018-09-06 MED ORDER — PEGFILGRASTIM-CBQV 6 MG/0.6ML ~~LOC~~ SOSY
PREFILLED_SYRINGE | SUBCUTANEOUS | Status: AC
  Filled 2018-09-06: qty 0.6

## 2018-09-08 ENCOUNTER — Telehealth: Payer: Self-pay

## 2018-09-08 NOTE — Telephone Encounter (Signed)
Contacted patient and made aware of results. 

## 2018-09-08 NOTE — Telephone Encounter (Signed)
-----   Message from Wyatt Portela, MD sent at 09/08/2018  8:55 AM EST ----- Please let him know his PSA is lower this time.

## 2018-09-13 ENCOUNTER — Other Ambulatory Visit: Payer: Self-pay | Admitting: Student in an Organized Health Care Education/Training Program

## 2018-09-13 DIAGNOSIS — C61 Malignant neoplasm of prostate: Secondary | ICD-10-CM

## 2018-09-26 ENCOUNTER — Inpatient Hospital Stay: Payer: Medicare Other

## 2018-09-26 ENCOUNTER — Inpatient Hospital Stay: Payer: Medicare Other | Attending: Oncology | Admitting: Oncology

## 2018-09-26 ENCOUNTER — Telehealth: Payer: Self-pay | Admitting: Oncology

## 2018-09-26 ENCOUNTER — Other Ambulatory Visit: Payer: Self-pay | Admitting: Oncology

## 2018-09-26 VITALS — BP 119/63 | HR 89 | Temp 98.5°F | Resp 18 | Ht 69.0 in | Wt 241.0 lb

## 2018-09-26 DIAGNOSIS — C61 Malignant neoplasm of prostate: Secondary | ICD-10-CM

## 2018-09-26 DIAGNOSIS — T451X5A Adverse effect of antineoplastic and immunosuppressive drugs, initial encounter: Secondary | ICD-10-CM | POA: Insufficient documentation

## 2018-09-26 DIAGNOSIS — R609 Edema, unspecified: Secondary | ICD-10-CM | POA: Diagnosis not present

## 2018-09-26 DIAGNOSIS — Z5111 Encounter for antineoplastic chemotherapy: Secondary | ICD-10-CM | POA: Insufficient documentation

## 2018-09-26 DIAGNOSIS — G620 Drug-induced polyneuropathy: Secondary | ICD-10-CM | POA: Diagnosis not present

## 2018-09-26 DIAGNOSIS — Z79899 Other long term (current) drug therapy: Secondary | ICD-10-CM | POA: Diagnosis not present

## 2018-09-26 DIAGNOSIS — Z5189 Encounter for other specified aftercare: Secondary | ICD-10-CM | POA: Insufficient documentation

## 2018-09-26 DIAGNOSIS — C7951 Secondary malignant neoplasm of bone: Secondary | ICD-10-CM | POA: Diagnosis not present

## 2018-09-26 DIAGNOSIS — D6481 Anemia due to antineoplastic chemotherapy: Secondary | ICD-10-CM

## 2018-09-26 LAB — CMP (CANCER CENTER ONLY)
ALT: 8 U/L (ref 0–44)
AST: 14 U/L — ABNORMAL LOW (ref 15–41)
Albumin: 3.1 g/dL — ABNORMAL LOW (ref 3.5–5.0)
Alkaline Phosphatase: 92 U/L (ref 38–126)
Anion gap: 7 (ref 5–15)
BUN: 16 mg/dL (ref 8–23)
CO2: 23 mmol/L (ref 22–32)
Calcium: 7.9 mg/dL — ABNORMAL LOW (ref 8.9–10.3)
Chloride: 111 mmol/L (ref 98–111)
Creatinine: 0.87 mg/dL (ref 0.61–1.24)
GFR, Est AFR Am: >60 mL/min (ref >60)
GFR, Estimated: >60 mL/min (ref >60)
Glucose, Bld: 109 mg/dL — ABNORMAL HIGH (ref 70–99)
Potassium: 4.1 mmol/L (ref 3.5–5.1)
Sodium: 141 mmol/L (ref 135–145)
Total Bilirubin: 0.3 mg/dL (ref 0.3–1.2)
Total Protein: 5.5 g/dL — ABNORMAL LOW (ref 6.5–8.1)

## 2018-09-26 LAB — CBC WITH DIFFERENTIAL (CANCER CENTER ONLY)
Abs Immature Granulocytes: 0.01 10*3/uL (ref 0.00–0.07)
Basophils Absolute: 0 10*3/uL (ref 0.0–0.1)
Basophils Relative: 1 %
Eosinophils Absolute: 0.1 10*3/uL (ref 0.0–0.5)
Eosinophils Relative: 2 %
HCT: 28.8 % — ABNORMAL LOW (ref 39.0–52.0)
Hemoglobin: 9 g/dL — ABNORMAL LOW (ref 13.0–17.0)
Immature Granulocytes: 0 %
Lymphocytes Relative: 27 %
Lymphs Abs: 1.5 10*3/uL (ref 0.7–4.0)
MCH: 28.6 pg (ref 26.0–34.0)
MCHC: 31.3 g/dL (ref 30.0–36.0)
MCV: 91.4 fL (ref 80.0–100.0)
Monocytes Absolute: 1 10*3/uL (ref 0.1–1.0)
Monocytes Relative: 18 %
Neutro Abs: 2.8 10*3/uL (ref 1.7–7.7)
Neutrophils Relative %: 52 %
Platelet Count: 329 10*3/uL (ref 150–400)
RBC: 3.15 MIL/uL — ABNORMAL LOW (ref 4.22–5.81)
RDW: 18.7 % — ABNORMAL HIGH (ref 11.5–15.5)
WBC Count: 5.3 10*3/uL (ref 4.0–10.5)
nRBC: 0 % (ref 0.0–0.2)

## 2018-09-26 MED ORDER — DEXAMETHASONE SODIUM PHOSPHATE 10 MG/ML IJ SOLN
INTRAMUSCULAR | Status: AC
  Filled 2018-09-26: qty 1

## 2018-09-26 MED ORDER — SODIUM CHLORIDE 0.9 % IV SOLN
Freq: Once | INTRAVENOUS | Status: AC
  Administered 2018-09-26: 10:00:00 via INTRAVENOUS
  Filled 2018-09-26: qty 250

## 2018-09-26 MED ORDER — SODIUM CHLORIDE 0.9% FLUSH
10.0000 mL | INTRAVENOUS | Status: DC | PRN
  Administered 2018-09-26: 10 mL
  Filled 2018-09-26: qty 10

## 2018-09-26 MED ORDER — SODIUM CHLORIDE 0.9 % IV SOLN
60.0000 mg/m2 | Freq: Once | INTRAVENOUS | Status: AC
  Administered 2018-09-26: 130 mg via INTRAVENOUS
  Filled 2018-09-26: qty 13

## 2018-09-26 MED ORDER — DEXAMETHASONE SODIUM PHOSPHATE 10 MG/ML IJ SOLN
10.0000 mg | Freq: Once | INTRAMUSCULAR | Status: AC
  Administered 2018-09-26: 10 mg via INTRAVENOUS

## 2018-09-26 MED ORDER — HEPARIN SOD (PORK) LOCK FLUSH 100 UNIT/ML IV SOLN
500.0000 [IU] | Freq: Once | INTRAVENOUS | Status: AC | PRN
  Administered 2018-09-26: 500 [IU]
  Filled 2018-09-26: qty 5

## 2018-09-26 MED ORDER — FUROSEMIDE 20 MG PO TABS: 20.0000 mg | ORAL_TABLET | Freq: Every day | ORAL | 0 refills | Status: DC

## 2018-09-26 NOTE — Telephone Encounter (Signed)
Printed calendar and avs. °

## 2018-09-26 NOTE — Progress Notes (Signed)
Hematology and Oncology Follow Up Visit  Dennis Fischer [id number] 1945-10-27 73 y.o. 09/26/2018 8:25 AM [name], MDFeng, [name], MD   Principle Diagnosis: 73 year old man with castration-resistant prostate cancer diagnosed in 2018.  He initially presented with a Gleason score 7 in 2016.  He has disease to the bone at this time.  Prior Therapy: He received definitive therapy using radiation therapy and androgen deprivation in 2016.  He developed advanced disease with pelvic adenopathy and was treated with androgen deprivation while was living in [place].  He subsequently developed castration resistant disease.  Zytiga 1000 mg daily restarted on 03/14/2017.  Therapy discontinued in August 2018 due to progression of disease.  Current therapy:   Lupron 30 mg every 4 months.    Taxotere chemotherapy at 75 mg/m given every 3 weeks started on the first 2019.  He is here for cycle 8 of therapy.   Interim History:  [name] is here for repeat evaluation.  Since the last visit, he has tolerated chemotherapy with few toxicities.  He does report increased lower extremity edema bilaterally in addition to grade 1 neuropathy in his upper and lower extremities.  He still able to ambulate without any difficulties although he is using a walker to rest at times.  He denies any infusion related complications, shortness of breath or anaphylaxis.  His performance status and quality of life has declined some because of chemotherapy.  He does not report any headaches, blurry vision, syncope or seizures.  He denies any confusion or lethargy.  He does not report any fevers, chills or sweats.  He does not report any cough, wheezing or hemoptysis. He does not report any chest pain, palpitation, orthopnea.  Denies any nausea, vomiting or early satiety.  He denies any constipation diarrhea.  He does not report any frequency urgency or hesitancy.  He does not report any bleeding or clotting tendency.  He denies any  anxiety or depression.  He denies any arthralgias or myalgias.  Remaining review of systems is negative.   Medications: I have reviewed the patient's current medications.  Current Outpatient Medications  Medication Sig Dispense Refill  . Acetaminophen (TYLENOL PO) Take 500 mg by mouth daily as needed.    Marland Kitchen amLODipine (NORVASC) 10 MG tablet Take 1 tablet (10 mg total) by mouth daily. 90 tablet 3  . aspirin 81 MG tablet Take 81 mg by mouth daily.    . calcium-vitamin D (OSCAL WITH D) 500-200 MG-UNIT tablet Take 1 tablet by mouth 2 (two) times daily. 60 tablet 3  . carvedilol (COREG) 12.5 MG tablet TAKE 1 TABLET BY MOUTH TWICE DAILY WITH A MEAL 180 tablet 3  . furosemide (LASIX) 20 MG tablet Take 1 tablet (20 mg total) by mouth daily. 30 tablet 0  . lidocaine-prilocaine (EMLA) cream Apply 1 application topically as needed. 30 g 0  . Multiple Vitamin (MULTIVITAMIN) tablet Take 1 tablet by mouth daily.    Marland Kitchen omeprazole (PRILOSEC) 20 MG capsule Take 1 capsule (20 mg total) by mouth daily. 90 capsule 0  . potassium chloride SA (K-DUR,KLOR-CON) 20 MEQ tablet TAKE 1 TABLET(20 MEQ) BY MOUTH DAILY 90 tablet 0  . prochlorperazine (COMPAZINE) 10 MG tablet TAKE 1 TABLET BY MOUTH EVERY 6 HOURS AS NEEDED FOR NAUSEA OR VOMITING 368 tablet 3  . sildenafil (VIAGRA) 100 MG tablet Take 1 tablet (100 mg total) by mouth daily as needed for erectile dysfunction. 30 tablet 3  . simvastatin (ZOCOR) 20 MG tablet TAKE 1 TABLET BY MOUTH  EVERY DAY 90 tablet 3   No current facility-administered medications for this visit.      Allergies:  Allergies  Allergen Reactions  . Lisinopril Swelling    Facial swelling  . Mango Flavor   . Peanut-Containing Drug Products     Past Medical History, Surgical history, Social history, and Family History reviewed and unchanged at this time.    Physical Exam:  Blood pressure 119/63, pulse 89, temperature 98.5 F (36.9 C), temperature source Oral, resp. rate 18, height 5\' 9"   (1.753 m), weight 241 lb (109.3 kg), SpO2 99 %.    ECOG: 1   General appearance: Comfortable appearing without any discomfort Head: Normocephalic without any trauma Oropharynx: Mucous membranes are moist and pink without any thrush or ulcers. Eyes: Pupils are equal and round reactive to light. Lymph nodes: No cervical, supraclavicular, inguinal or axillary lymphadenopathy.   Heart:regular rate and rhythm.  S1 and S2.  1+ edema noted bilaterally. Lung: Clear without any rhonchi or wheezes.  No dullness to percussion. Abdomin: Soft, nontender, nondistended with good bowel sounds.  No hepatosplenomegaly. Musculoskeletal: No joint deformity or effusion.  Full range of motion noted. Neurological: No deficits noted on motor, sensory and deep tendon reflex exam. Skin: No petechial rash or dryness.  Appeared moist.           Lab Results: Lab Results  Component Value Date   WBC 5.3 09/26/2018   HGB 9.0 (L) 09/26/2018   HCT 28.8 (L) 09/26/2018   MCV 91.4 09/26/2018   PLT 329 09/26/2018     Chemistry      Component Value Date/Time   NA 142 09/05/2018 0746   NA 141 08/08/2017 1116   K 4.2 09/05/2018 0746   K 3.6 08/08/2017 1116   CL 112 (H) 09/05/2018 0746   CO2 23 09/05/2018 0746   CO2 24 08/08/2017 1116   BUN 16 09/05/2018 0746   BUN 13.2 08/08/2017 1116   CREATININE 0.90 09/05/2018 0746   CREATININE 1.1 08/08/2017 1116      Component Value Date/Time   CALCIUM 8.3 (L) 09/05/2018 0746   CALCIUM 9.1 08/08/2017 1116   ALKPHOS 101 09/05/2018 0746   ALKPHOS 214 (H) 08/08/2017 1116   AST 15 09/05/2018 0746   AST 17 08/08/2017 1116   ALT 13 09/05/2018 0746   ALT 17 08/08/2017 1116   BILITOT 0.3 09/05/2018 0746   BILITOT 0.29 08/08/2017 1116        Results for SOMA, LIZAK (MRN [id number]) as of 09/26/2018 08:14  Ref. Range 08/13/2018 11:26 09/05/2018 07:46  Prostate Specific Ag, Serum Latest Ref Range: 0.0 - 4.0 ng/mL 54.0 (H) 43.9 (H)       Impression and  Plan:  73 year old man with:   1.  Castration-resistant prostate cancer with disease to the bone diagnosed in 2017.    He is currently on Taxotere chemotherapy with few complications.  He is started experiencing worsening neuropathy and edema.  Risks and benefits of continuing chemotherapy was discussed today and is agreeable to complete cycle 8 of therapy and requesting a treatment break.  I will proceed with therapy today at a reduced dose of Taxotere because of his neuropathy and will continue to follow him closely after that.  Different salvage therapy may be needed in the future.  He had an excellent response to chemotherapy with more than 50% reduction in his PSA.    2. Androgen depravation: He remains on Lupron which will be due in February 2020.  3.  Anemia: Related to chemotherapy with hemoglobin remained stable at this time without any need for transfusion.  4.  Goals of care: Therapy remains palliative although aggressive therapy is warranted given his excellent performance status.  5.  Bone directed therapy: I recommended calcium and vitamin D supplements.  We will consider Xgeva after obtaining dental clearance.  6.  IV access: Port-A-Cath has been used for chemotherapy and will be flushed periodically after that.   7.  Antiemetics: Compazine has been effective in treating his nausea.  8.  Edema: Related to Taxotere chemotherapy and discontinuation will help moving forward.  I will start him on low-dose diuretics for the next 3 to 4 weeks and monitor him closely.  9. Follow-up: in 4 to 6 weeks to follow his progress.  25  minutes was spent with the patient face-to-face today.  More than 50% of time was dedicated to counseling his disease status, treatment options and managing complications related to therapy.  Zola Button, MD 1/17/20208:25 AM

## 2018-09-26 NOTE — Patient Instructions (Signed)
[facility] Discharge Instructions for Patients Receiving Chemotherapy  Today you received the following chemotherapy agents: Docetaxel (Taxotere)  To help prevent nausea and vomiting after your treatment, we encourage you to take your nausea medication as directed.    If you develop nausea and vomiting that is not controlled by your nausea medication, call the clinic.   BELOW ARE SYMPTOMS THAT SHOULD BE REPORTED IMMEDIATELY:  *FEVER GREATER THAN 100.5 F  *CHILLS WITH OR WITHOUT FEVER  NAUSEA AND VOMITING THAT IS NOT CONTROLLED WITH YOUR NAUSEA MEDICATION  *UNUSUAL SHORTNESS OF BREATH  *UNUSUAL BRUISING OR BLEEDING  TENDERNESS IN MOUTH AND THROAT WITH OR WITHOUT PRESENCE OF ULCERS  *URINARY PROBLEMS  *BOWEL PROBLEMS  UNUSUAL RASH Items with * indicate a potential emergency and should be followed up as soon as possible.  Feel free to call the clinic should you have any questions or concerns. The clinic phone number is [phone].  Please show the CHEMO ALERT CARD at check-in to the Emergency Department and triage nurse.   

## 2018-09-27 ENCOUNTER — Inpatient Hospital Stay: Payer: Medicare Other

## 2018-09-27 VITALS — BP 130/82 | HR 91 | Temp 98.3°F | Resp 18

## 2018-09-27 DIAGNOSIS — C61 Malignant neoplasm of prostate: Secondary | ICD-10-CM

## 2018-09-27 DIAGNOSIS — Z5111 Encounter for antineoplastic chemotherapy: Secondary | ICD-10-CM | POA: Diagnosis not present

## 2018-09-27 DIAGNOSIS — T451X5A Adverse effect of antineoplastic and immunosuppressive drugs, initial encounter: Secondary | ICD-10-CM | POA: Diagnosis not present

## 2018-09-27 DIAGNOSIS — G620 Drug-induced polyneuropathy: Secondary | ICD-10-CM | POA: Diagnosis not present

## 2018-09-27 DIAGNOSIS — C7951 Secondary malignant neoplasm of bone: Secondary | ICD-10-CM | POA: Diagnosis not present

## 2018-09-27 DIAGNOSIS — D6481 Anemia due to antineoplastic chemotherapy: Secondary | ICD-10-CM | POA: Diagnosis not present

## 2018-09-27 LAB — PROSTATE-SPECIFIC AG, SERUM (LABCORP): Prostate Specific Ag, Serum: 37.2 ng/mL — ABNORMAL HIGH (ref 0.0–4.0)

## 2018-09-27 MED ORDER — PEGFILGRASTIM-CBQV 6 MG/0.6ML ~~LOC~~ SOSY
PREFILLED_SYRINGE | SUBCUTANEOUS | Status: AC
  Filled 2018-09-27: qty 0.6

## 2018-09-27 MED ORDER — PEGFILGRASTIM-CBQV 6 MG/0.6ML ~~LOC~~ SOSY
6.0000 mg | PREFILLED_SYRINGE | Freq: Once | SUBCUTANEOUS | Status: AC
  Administered 2018-09-27: 6 mg via SUBCUTANEOUS

## 2018-09-27 NOTE — Patient Instructions (Signed)
Pegfilgrastim injection What is this medicine? PEGFILGRASTIM (PEG fil gra stim) is a long-acting granulocyte colony-stimulating factor that stimulates the growth of neutrophils, a type of white blood cell important in the body's fight against infection. It is used to reduce the incidence of fever and infection in patients with certain types of cancer who are receiving chemotherapy that affects the bone marrow, and to increase survival after being exposed to high doses of radiation. This medicine may be used for other purposes; ask your health care provider or pharmacist if you have questions. COMMON BRAND NAME(S): Neulasta, Udenyca What should I tell my health care provider before I take this medicine? They need to know if you have any of these conditions: -kidney disease -latex allergy -ongoing radiation therapy -sickle cell disease -skin reactions to acrylic adhesives (On-Body Injector only) -an unusual or allergic reaction to pegfilgrastim, filgrastim, other medicines, foods, dyes, or preservatives -pregnant or trying to get pregnant -breast-feeding How should I use this medicine? This medicine is for injection under the skin. If you get this medicine at home, you will be taught how to prepare and give the pre-filled syringe or how to use the On-body Injector. Refer to the patient Instructions for Use for detailed instructions. Use exactly as directed. Tell your healthcare provider immediately if you suspect that the On-body Injector may not have performed as intended or if you suspect the use of the On-body Injector resulted in a missed or partial dose. It is important that you put your used needles and syringes in a special sharps container. Do not put them in a trash can. If you do not have a sharps container, call your pharmacist or healthcare provider to get one. Talk to your pediatrician regarding the use of this medicine in children. While this drug may be prescribed for selected  conditions, precautions do apply. Overdosage: If you think you have taken too much of this medicine contact a poison control center or emergency room at once. NOTE: This medicine is only for you. Do not share this medicine with others. What if I miss a dose? It is important not to miss your dose. Call your doctor or health care professional if you miss your dose. If you miss a dose due to an On-body Injector failure or leakage, a new dose should be administered as soon as possible using a single prefilled syringe for manual use. What may interact with this medicine? Interactions have not been studied. Give your health care provider a list of all the medicines, herbs, non-prescription drugs, or dietary supplements you use. Also tell them if you smoke, drink alcohol, or use illegal drugs. Some items may interact with your medicine. This list may not describe all possible interactions. Give your health care provider a list of all the medicines, herbs, non-prescription drugs, or dietary supplements you use. Also tell them if you smoke, drink alcohol, or use illegal drugs. Some items may interact with your medicine. What should I watch for while using this medicine? You may need blood work done while you are taking this medicine. If you are going to need a MRI, CT scan, or other procedure, tell your doctor that you are using this medicine (On-Body Injector only). What side effects may I notice from receiving this medicine? Side effects that you should report to your doctor or health care professional as soon as possible: -allergic reactions like skin rash, itching or hives, swelling of the face, lips, or tongue -dizziness -fever -pain, redness, or irritation at  site where injected -pinpoint red spots on the skin -red or dark-brown urine -shortness of breath or breathing problems -stomach or side pain, or pain at the shoulder -swelling -tiredness -trouble passing urine or change in the amount of  urine Side effects that usually do not require medical attention (report to your doctor or health care professional if they continue or are bothersome): -bone pain -muscle pain This list may not describe all possible side effects. Call your doctor for medical advice about side effects. You may report side effects to FDA at 1-800-FDA-1088. Where should I keep my medicine? Keep out of the reach of children. Store pre-filled syringes in a refrigerator between 2 and 8 degrees C (36 and 46 degrees F). Do not freeze. Keep in carton to protect from light. Throw away this medicine if it is left out of the refrigerator for more than 48 hours. Throw away any unused medicine after the expiration date. NOTE: This sheet is a summary. It may not cover all possible information. If you have questions about this medicine, talk to your doctor, pharmacist, or health care provider.  2018 Elsevier/Gold Standard (2016-08-23 12:58:03) Ellen Henri

## 2018-09-29 ENCOUNTER — Telehealth: Payer: Self-pay | Admitting: *Deleted

## 2018-09-29 NOTE — Telephone Encounter (Signed)
Left message with note below 

## 2018-09-29 NOTE — Telephone Encounter (Signed)
-----   Message from Wyatt Portela, MD sent at 09/29/2018  8:56 AM EST ----- Please let him know his PSA is down.

## 2018-09-30 ENCOUNTER — Telehealth: Payer: Self-pay

## 2018-09-30 NOTE — Telephone Encounter (Signed)
Patient called and stated that he has a nail on his left foot and finger in left hand that is detaching and some of the other nails are brittle. The nails are sore but not impacting his activities or ability to ambulate. Informed patient of the importance of keeping his hands and feet/nail beds clean due to higher risk of infection due to the breaks in nail/skin integrity, and to keep the detaching nails covered with a bandaid for protection. Explained that if the pain increases, any redness, heat, drainage, signs of infection to make [place] aware to be seen in [facility].

## 2018-10-13 ENCOUNTER — Other Ambulatory Visit: Payer: Self-pay | Admitting: Medical

## 2018-10-13 ENCOUNTER — Telehealth: Payer: Self-pay

## 2018-10-13 MED ORDER — CEPHALEXIN 500 MG PO CAPS: 500.0000 mg | ORAL_CAPSULE | Freq: Four times a day (QID) | ORAL | 0 refills | Status: DC

## 2018-10-13 NOTE — Telephone Encounter (Signed)
Received call from patient stating that he has several nails on both hands and the nail on one big toe that are either coming off or have already come off and are now "oozing puss". He stated that he is keeping his hands and feet clean, washing regularly, and covering with bandiades. He stated that he is not having pain and is aware of risk for infection. This RN spoke with Sandi Mealy PA and he will call an antibiotic to patient preferred pharmacy. Spoke with patient and verified that he has no antibiotic allergies and preferred pharmacy. Patient is aware that a prescription for an antibiotic will be called in to his pharmacy and to call back if he has any other questions or concerns.

## 2018-11-05 ENCOUNTER — Inpatient Hospital Stay: Payer: Medicare Other

## 2018-11-05 ENCOUNTER — Inpatient Hospital Stay: Payer: Medicare Other | Attending: Oncology

## 2018-11-05 DIAGNOSIS — D630 Anemia in neoplastic disease: Secondary | ICD-10-CM | POA: Diagnosis not present

## 2018-11-05 DIAGNOSIS — C61 Malignant neoplasm of prostate: Secondary | ICD-10-CM

## 2018-11-05 DIAGNOSIS — Z9221 Personal history of antineoplastic chemotherapy: Secondary | ICD-10-CM | POA: Diagnosis not present

## 2018-11-05 DIAGNOSIS — Z79818 Long term (current) use of other agents affecting estrogen receptors and estrogen levels: Secondary | ICD-10-CM | POA: Insufficient documentation

## 2018-11-05 DIAGNOSIS — Z79899 Other long term (current) drug therapy: Secondary | ICD-10-CM | POA: Insufficient documentation

## 2018-11-05 DIAGNOSIS — C7951 Secondary malignant neoplasm of bone: Secondary | ICD-10-CM | POA: Diagnosis not present

## 2018-11-05 DIAGNOSIS — Z95828 Presence of other vascular implants and grafts: Secondary | ICD-10-CM

## 2018-11-05 LAB — CMP (CANCER CENTER ONLY)
ALT: 8 U/L (ref 0–44)
AST: 18 U/L (ref 15–41)
Albumin: 3.5 g/dL (ref 3.5–5.0)
Alkaline Phosphatase: 139 U/L — ABNORMAL HIGH (ref 38–126)
Anion gap: 10 (ref 5–15)
BUN: 14 mg/dL (ref 8–23)
CO2: 22 mmol/L (ref 22–32)
Calcium: 8.5 mg/dL — ABNORMAL LOW (ref 8.9–10.3)
Chloride: 109 mmol/L (ref 98–111)
Creatinine: 0.85 mg/dL (ref 0.61–1.24)
GFR, Est AFR Am: >60 mL/min (ref >60)
GFR, Estimated: >60 mL/min (ref >60)
Glucose, Bld: 107 mg/dL — ABNORMAL HIGH (ref 70–99)
Potassium: 4.1 mmol/L (ref 3.5–5.1)
Sodium: 141 mmol/L (ref 135–145)
Total Bilirubin: 0.5 mg/dL (ref 0.3–1.2)
Total Protein: 6.8 g/dL (ref 6.5–8.1)

## 2018-11-05 LAB — CBC WITH DIFFERENTIAL (CANCER CENTER ONLY)
Abs Immature Granulocytes: 0 10*3/uL (ref 0.00–0.07)
Basophils Absolute: 0 10*3/uL (ref 0.0–0.1)
Basophils Relative: 0 %
Eosinophils Absolute: 0.5 10*3/uL (ref 0.0–0.5)
Eosinophils Relative: 8 %
HCT: 30.6 % — ABNORMAL LOW (ref 39.0–52.0)
Hemoglobin: 9.6 g/dL — ABNORMAL LOW (ref 13.0–17.0)
Immature Granulocytes: 0 %
Lymphocytes Relative: 26 %
Lymphs Abs: 1.4 10*3/uL (ref 0.7–4.0)
MCH: 27.2 pg (ref 26.0–34.0)
MCHC: 31.4 g/dL (ref 30.0–36.0)
MCV: 86.7 fL (ref 80.0–100.0)
Monocytes Absolute: 0.4 10*3/uL (ref 0.1–1.0)
Monocytes Relative: 7 %
Neutro Abs: 3.2 10*3/uL (ref 1.7–7.7)
Neutrophils Relative %: 59 %
Platelet Count: 222 10*3/uL (ref 150–400)
RBC: 3.53 MIL/uL — ABNORMAL LOW (ref 4.22–5.81)
RDW: 17.4 % — ABNORMAL HIGH (ref 11.5–15.5)
WBC Count: 5.5 10*3/uL (ref 4.0–10.5)
nRBC: 0 % (ref 0.0–0.2)

## 2018-11-05 MED ORDER — HEPARIN SOD (PORK) LOCK FLUSH 100 UNIT/ML IV SOLN
500.0000 [IU] | Freq: Once | INTRAVENOUS | Status: AC
  Administered 2018-11-05: 500 [IU]
  Filled 2018-11-05: qty 5

## 2018-11-05 MED ORDER — SODIUM CHLORIDE 0.9% FLUSH
10.0000 mL | Freq: Once | INTRAVENOUS | Status: AC
  Administered 2018-11-05: 10 mL
  Filled 2018-11-05: qty 10

## 2018-11-06 ENCOUNTER — Telehealth: Payer: Self-pay | Admitting: Oncology

## 2018-11-06 ENCOUNTER — Encounter: Payer: Self-pay | Admitting: Oncology

## 2018-11-06 ENCOUNTER — Inpatient Hospital Stay: Payer: Medicare Other

## 2018-11-06 ENCOUNTER — Inpatient Hospital Stay (HOSPITAL_BASED_OUTPATIENT_CLINIC_OR_DEPARTMENT_OTHER): Payer: Medicare Other | Admitting: Oncology

## 2018-11-06 VITALS — BP 131/73 | HR 80 | Temp 98.1°F | Resp 18 | Ht 69.0 in | Wt 219.2 lb

## 2018-11-06 DIAGNOSIS — Z79899 Other long term (current) drug therapy: Secondary | ICD-10-CM

## 2018-11-06 DIAGNOSIS — Z95828 Presence of other vascular implants and grafts: Secondary | ICD-10-CM

## 2018-11-06 DIAGNOSIS — C7951 Secondary malignant neoplasm of bone: Secondary | ICD-10-CM | POA: Diagnosis not present

## 2018-11-06 DIAGNOSIS — C61 Malignant neoplasm of prostate: Secondary | ICD-10-CM

## 2018-11-06 DIAGNOSIS — D630 Anemia in neoplastic disease: Secondary | ICD-10-CM | POA: Diagnosis not present

## 2018-11-06 DIAGNOSIS — Z9221 Personal history of antineoplastic chemotherapy: Secondary | ICD-10-CM | POA: Diagnosis not present

## 2018-11-06 DIAGNOSIS — Z79818 Long term (current) use of other agents affecting estrogen receptors and estrogen levels: Secondary | ICD-10-CM

## 2018-11-06 LAB — PROSTATE-SPECIFIC AG, SERUM (LABCORP): Prostate Specific Ag, Serum: 48.3 ng/mL — ABNORMAL HIGH (ref 0.0–4.0)

## 2018-11-06 MED ORDER — LEUPROLIDE ACETATE (4 MONTH) 30 MG IM KIT
30.0000 mg | PACK | Freq: Once | INTRAMUSCULAR | Status: AC
  Administered 2018-11-06: 30 mg via INTRAMUSCULAR
  Filled 2018-11-06: qty 30

## 2018-11-06 NOTE — Patient Instructions (Signed)

## 2018-11-06 NOTE — Addendum Note (Signed)
Addended by: Wyatt Portela on: 11/06/2018 10:25 AM   Modules accepted: Orders

## 2018-11-06 NOTE — Telephone Encounter (Signed)
Gave avs and calendar ° °

## 2018-11-06 NOTE — Progress Notes (Signed)
Hematology and Oncology Follow Up Visit  Breyer Tejera [id number] 05-05-1946 73 y.o. 11/06/2018 9:49 AM [name], MDFeng, [name], MD   Principle Diagnosis: 73 year old man with advanced prostate cancer with disease to the bone that is currently castration-resistant diagnosed in 2018.    Prior Therapy: He received definitive therapy using radiation therapy and androgen deprivation in 2016.  He developed advanced disease with pelvic adenopathy and was treated with androgen deprivation while was living in [place].  He subsequently developed castration resistant disease.  Zytiga 1000 mg daily restarted on 03/14/2017.  Therapy discontinued in August 2018 due to progression of disease.  Taxotere chemotherapy at 75 mg/m given every 3 weeks started on the first 2019.  He completed 8 cycles of therapy in January 2020.  Current therapy:   Lupron 30 mg every 4 months.       Interim History:  Mr. Liwanag presents today for repeat evaluation.  Since the last visit, he reports improvement in his health since stopping chemotherapy.  He reports improvement in his overall performance status and energy level.  His lower extremity edema has also improved significantly.  His quality of life and performance status continues to improve.  He denies any recent hospitalization or illnesses.  He does report nail changes related to chemotherapy.  Patient denied any alteration mental status, neuropathy, confusion or dizziness.  Denies any headaches or lethargy.  Denies any night sweats, weight loss or changes in appetite.  Denied orthopnea, dyspnea on exertion or chest discomfort.  Denies shortness of breath, difficulty breathing hemoptysis or cough.  Denies any abdominal distention, nausea, early satiety or dyspepsia.  Denies any hematuria, frequency, dysuria or nocturia.  Denies any skin irritation, dryness or rash.  Denies any ecchymosis or petechiae.  Denies any lymphadenopathy or clotting.  Denies any heat or  cold intolerance.  Denies any anxiety or depression.  Remaining review of system is negative.    Medications: I have reviewed the patient's current medications.  Current Outpatient Medications  Medication Sig Dispense Refill  . Acetaminophen (TYLENOL PO) Take 500 mg by mouth daily as needed.    Marland Kitchen amLODipine (NORVASC) 10 MG tablet Take 1 tablet (10 mg total) by mouth daily. 90 tablet 3  . aspirin 81 MG tablet Take 81 mg by mouth daily.    . calcium-vitamin D (OSCAL WITH D) 500-200 MG-UNIT tablet Take 1 tablet by mouth 2 (two) times daily. 60 tablet 3  . carvedilol (COREG) 12.5 MG tablet TAKE 1 TABLET BY MOUTH TWICE DAILY WITH A MEAL 180 tablet 3  . cephALEXin (KEFLEX) 500 MG capsule Take 1 capsule (500 mg total) by mouth 4 (four) times daily. 28 capsule 0  . furosemide (LASIX) 20 MG tablet TAKE 1 TABLET(20 MG) BY MOUTH DAILY 90 tablet 0  . lidocaine-prilocaine (EMLA) cream Apply 1 application topically as needed. 30 g 0  . Multiple Vitamin (MULTIVITAMIN) tablet Take 1 tablet by mouth daily.    Marland Kitchen omeprazole (PRILOSEC) 20 MG capsule Take 1 capsule (20 mg total) by mouth daily. 90 capsule 0  . potassium chloride SA (K-DUR,KLOR-CON) 20 MEQ tablet TAKE 1 TABLET(20 MEQ) BY MOUTH DAILY 90 tablet 0  . prochlorperazine (COMPAZINE) 10 MG tablet TAKE 1 TABLET BY MOUTH EVERY 6 HOURS AS NEEDED FOR NAUSEA OR VOMITING 368 tablet 3  . sildenafil (VIAGRA) 100 MG tablet Take 1 tablet (100 mg total) by mouth daily as needed for erectile dysfunction. 30 tablet 3  . simvastatin (ZOCOR) 20 MG tablet TAKE 1 TABLET BY  MOUTH EVERY DAY 90 tablet 3   No current facility-administered medications for this visit.      Allergies:  Allergies  Allergen Reactions  . Lisinopril Swelling    Facial swelling  . Mango Flavor   . Peanut-Containing Drug Products     Past Medical History, Surgical history, Social history, and Family History reviewed and unchanged at this time.    Physical Exam:  Blood pressure 131/73,  pulse 80, temperature 98.1 F (36.7 C), temperature source Oral, resp. rate 18, height 5\' 9"  (1.753 m), weight 219 lb 3.2 oz (99.4 kg), SpO2 99 %.     ECOG: 1   General appearance: Alert, awake without any distress. Head: Atraumatic without abnormalities Oropharynx: Without any thrush or ulcers. Eyes: No scleral icterus. Lymph nodes: No lymphadenopathy noted in the cervical, supraclavicular, or axillary nodes Heart:regular rate and rhythm, without any murmurs or gallops.   Lung: Clear to auscultation without any rhonchi, wheezes or dullness to percussion. Abdomin: Soft, nontender without any shifting dullness or ascites. Musculoskeletal: No clubbing or cyanosis. Neurological: No motor or sensory deficits. Skin: No rashes or lesions. Psychiatric: Mood and affect appeared normal.           Lab Results: Lab Results  Component Value Date   WBC 5.5 11/05/2018   HGB 9.6 (L) 11/05/2018   HCT 30.6 (L) 11/05/2018   MCV 86.7 11/05/2018   PLT 222 11/05/2018     Chemistry      Component Value Date/Time   NA 141 11/05/2018 0746   NA 141 08/08/2017 1116   K 4.1 11/05/2018 0746   K 3.6 08/08/2017 1116   CL 109 11/05/2018 0746   CO2 22 11/05/2018 0746   CO2 24 08/08/2017 1116   BUN 14 11/05/2018 0746   BUN 13.2 08/08/2017 1116   CREATININE 0.85 11/05/2018 0746   CREATININE 1.1 08/08/2017 1116      Component Value Date/Time   CALCIUM 8.5 (L) 11/05/2018 0746   CALCIUM 9.1 08/08/2017 1116   ALKPHOS 139 (H) 11/05/2018 0746   ALKPHOS 214 (H) 08/08/2017 1116   AST 18 11/05/2018 0746   AST 17 08/08/2017 1116   ALT 8 11/05/2018 0746   ALT 17 08/08/2017 1116   BILITOT 0.5 11/05/2018 0746   BILITOT 0.29 08/08/2017 1116        Results for ARRIS, MEYN (MRN [id number]) as of 11/06/2018 09:22  Ref. Range 09/26/2018 07:36 11/05/2018 07:46  Prostate Specific Ag, Serum Latest Ref Range: 0.0 - 4.0 ng/mL 37.2 (H) 48.3 (H)        Impression and Plan:  73 year old man  with:   1.  Advanced prostate cancer with disease to the bone that is currently castration-resistant noted in 2016.  He completed 8 cycles of Taxotere chemotherapy with excellent PSA response of more than 50%.  He requested a treatment break after 8 cycles of therapy.  His PSA did show a slight increase in the last month although no clinical signs or symptoms of disease progression.  The natural course of his disease and future treatment options were reviewed.  I recommended repeat imaging studies with CT scan and bone scan and consider salvage therapy if he has disease progression.  Options of therapy will be resuming Taxotere chemotherapy, Jevtana chemotherapy or Xofigo.  After discussion today, I will arrange for CT scan and bone scan and if he continues to show bone disease only Trudi Ida will be a reasonable option for him given his elevated alkaline phosphatase.  2. Androgen depravation: He will continue to receive Lupron indefinitely.  He is to this injection today.  3.  Anemia: Due to malignancy and chemotherapy.  Hemoglobin is improving.  4.  Goals of care: Therapy remains palliative although aggressive therapy is warranted given his excellent performance status.  5.  Bone directed therapy: He remains on calcium and vitamin D supplements.  Delton See has been deferred at this time.  6.  IV access: Port-A-Cath will remain in place and will be flushed periodically.   7.  Edema: Improved at this time.  Lasix will be discontinued.  9. Follow-up: in 4 to 6 weeks to follow his progress.  25  minutes was spent with the patient face-to-face today.  More than 50% of time was dedicated to discussing his disease status, treatment options and dealing with complications related to therapy.   Zola Button, MD 2/27/20209:49 AM

## 2018-11-17 ENCOUNTER — Ambulatory Visit (HOSPITAL_COMMUNITY)
Admission: RE | Admit: 2018-11-17 | Discharge: 2018-11-17 | Disposition: A | Discharge status: Home / Self Care | Payer: Medicare Other | Source: Ambulatory Visit | Attending: Oncology | Admitting: Oncology

## 2018-11-17 ENCOUNTER — Encounter (HOSPITAL_COMMUNITY)
Admission: RE | Admit: 2018-11-17 | Discharge: 2018-11-17 | Disposition: A | Discharge status: Home / Self Care | Payer: Medicare Other | Source: Ambulatory Visit | Attending: Oncology | Admitting: Oncology

## 2018-11-17 ENCOUNTER — Encounter (HOSPITAL_COMMUNITY): Payer: Self-pay

## 2018-11-17 DIAGNOSIS — C61 Malignant neoplasm of prostate: Secondary | ICD-10-CM | POA: Diagnosis not present

## 2018-11-17 HISTORY — DX: Secondary malignant neoplasm of bone: C79.51

## 2018-11-17 IMAGING — NM NUCLEAR MEDICINE WHOLE BODY BONE SCINTIGRAPHY
2 series · 2 of 2 positions shown · non-contrast
Comparison: Whole-body bone scan [DATE].

CLINICAL DATA: Metastatic prostate cancer.

EXAM:
NUCLEAR MEDICINE WHOLE BODY BONE SCAN
TECHNIQUE: Whole body anterior and posterior images were obtained approximately
3 hours after intravenous injection of radiopharmaceutical.
RADIOPHARMACEUTICALS:  20.8 mCi [FF] MDP IV

[Series 1: wbr_bone_40 whole body · 2.66mm/px · 1 of 1 slices shown (1 of 2)]
[im 1/1]
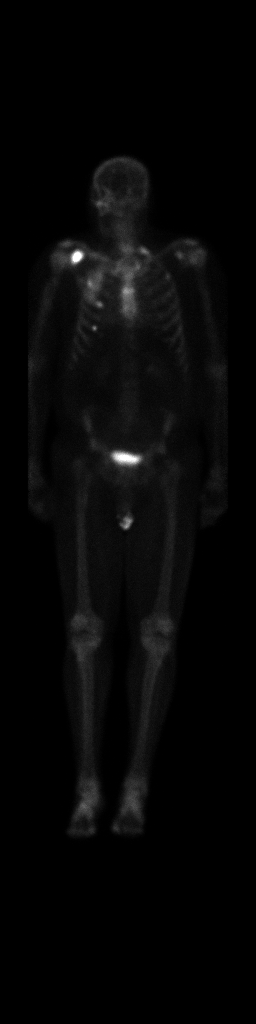

[Series 1: wbr_bone_40 whole body · 2.66mm/px · 1 of 1 slices shown (2 of 2)]
[im 1/1]
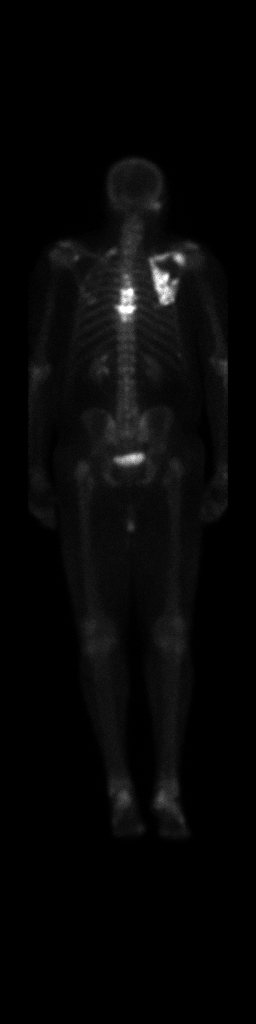

[2 of 2 positions shown; findings below may reference images not displayed]

FINDINGS: Extensive uptake is again noted in the right scapula. There is a
punctate focus uptake again seen posteriorly in the left scapula.
Right anterior rib uptake is present at the fifth and seventh right
ribs. Left lateral rib uptake is stable.

Extensive uptake in the midthoracic spine is similar the prior
study.

Focal uptake at the right mandible is again seen.
IMPRESSION: Multifocal uptake of radiotracer consistent with diffuse osseous
metastases. No new lesions are present.

## 2018-11-17 IMAGING — CT CT ABDOMEN AND PELVIS WITH CONTRAST
2 of 5 series · 16 of 46 positions shown, 18 images · IV contrast (OMNIPAQUE)
Comparison: [DATE] and [DATE].

CLINICAL DATA: Prostate cancer.  Assess treatment response.

EXAM:
CT ABDOMEN AND PELVIS WITH CONTRAST
TECHNIQUE: Multidetector CT imaging of the abdomen and pelvis was performed
using the standard protocol following bolus administration of
intravenous contrast.
CONTRAST:  100mL OMNIPAQUE IOHEXOL 300 MG/ML  SOLN

[Series 2: axial st · axial · 0.79mm/px · z∈[-661,-271]mm · 13 of 92 slices shown, 15 images]
[im 7/92  soft-tissue]
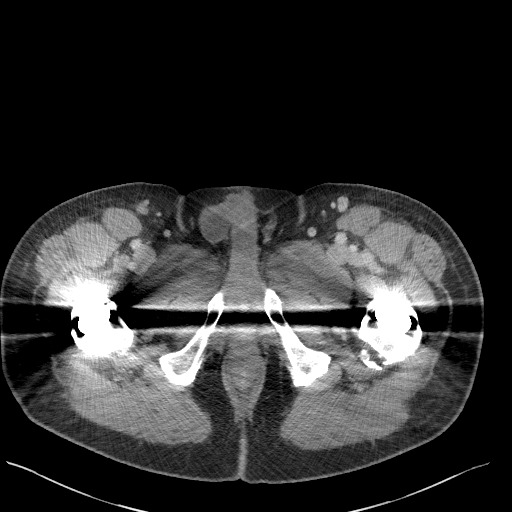
[im 7/92  bone]
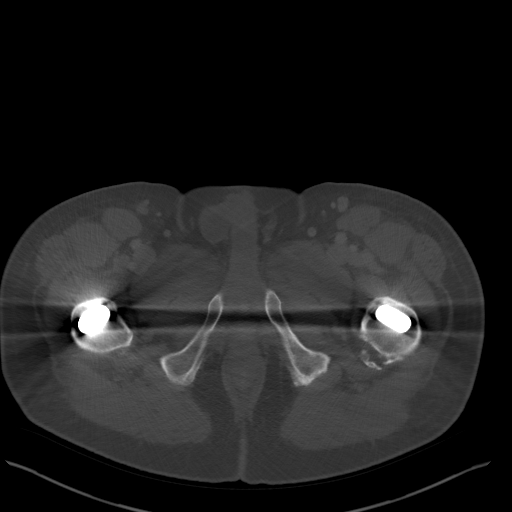
[im 13/92  soft-tissue]
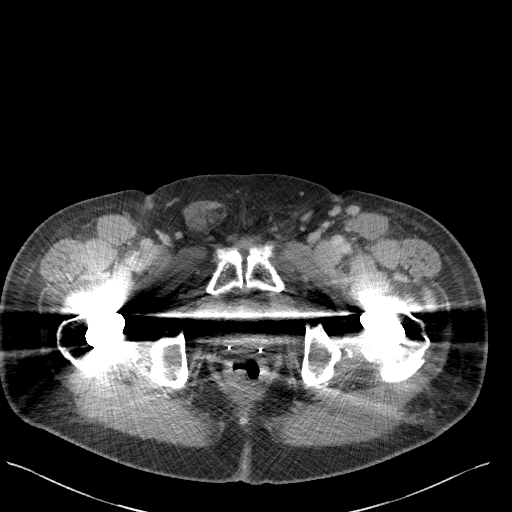
[im 19/92  soft-tissue]
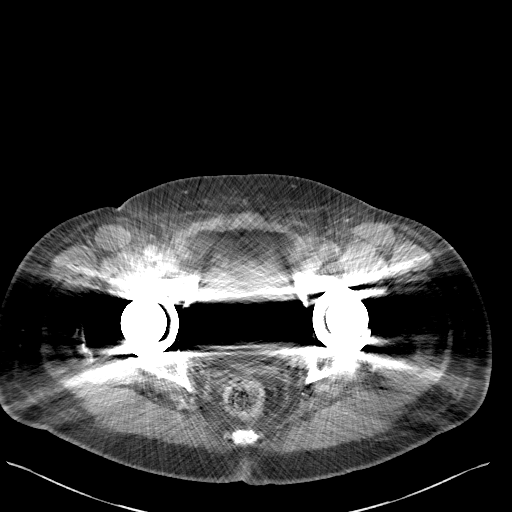
[im 25/92  soft-tissue]
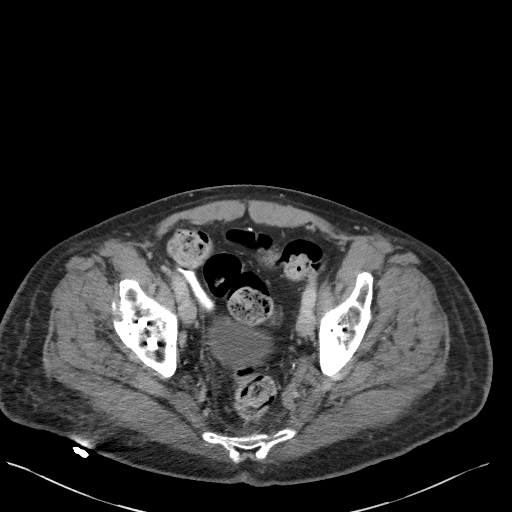
[im 31/92  soft-tissue]
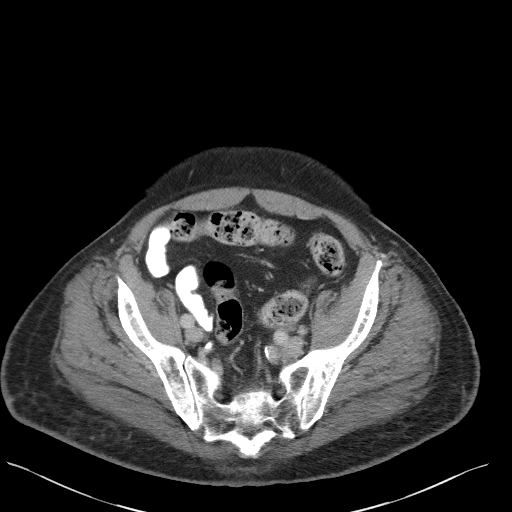
[im 37/92  soft-tissue]
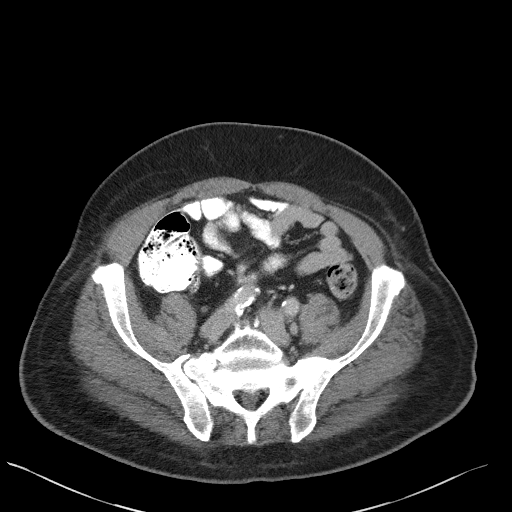
[im 49/92  soft-tissue]
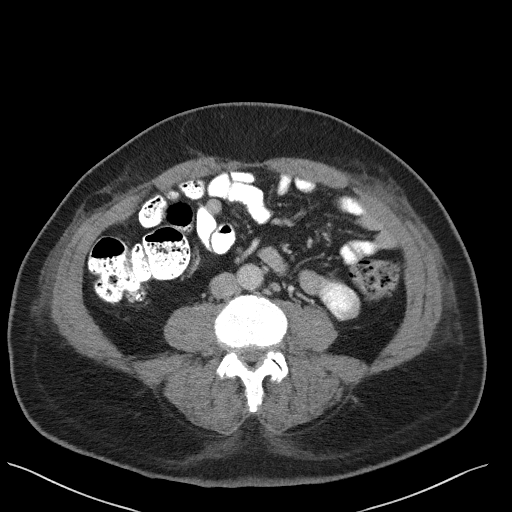
[im 55/92  soft-tissue]
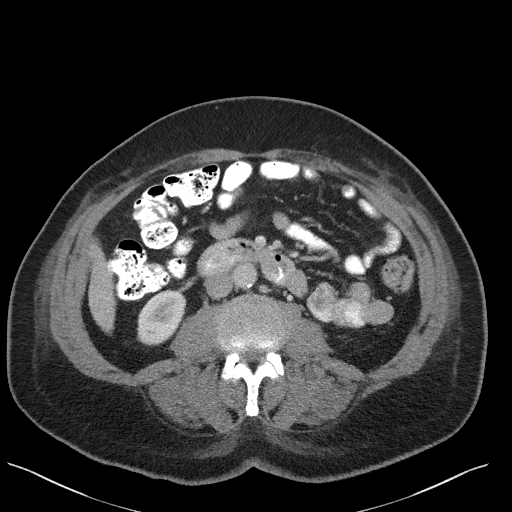
[im 61/92  soft-tissue]
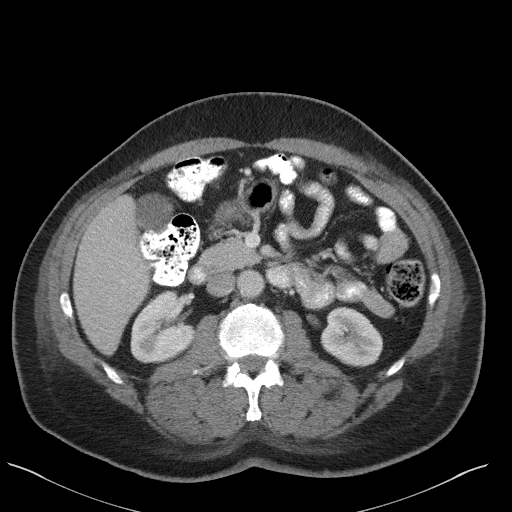
[im 61/92  bone]
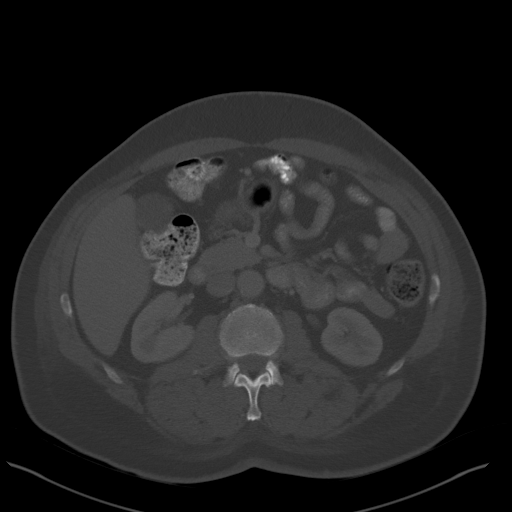
[im 67/92  soft-tissue]
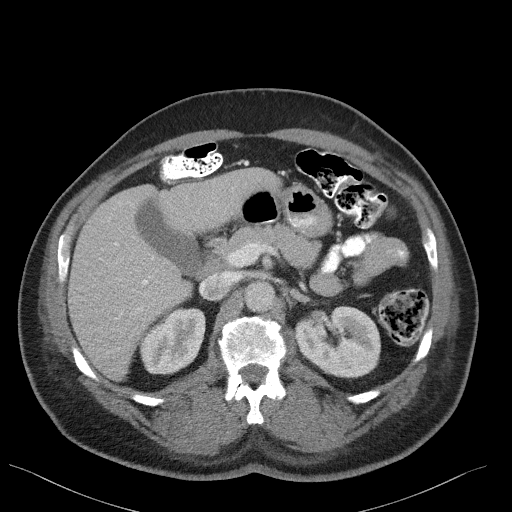
[im 73/92  soft-tissue]
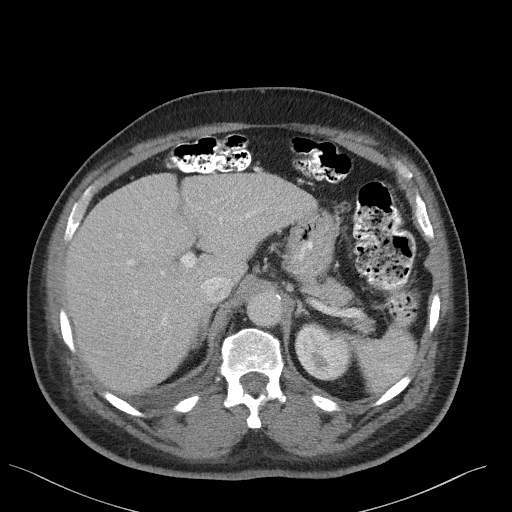
[im 79/92  soft-tissue]
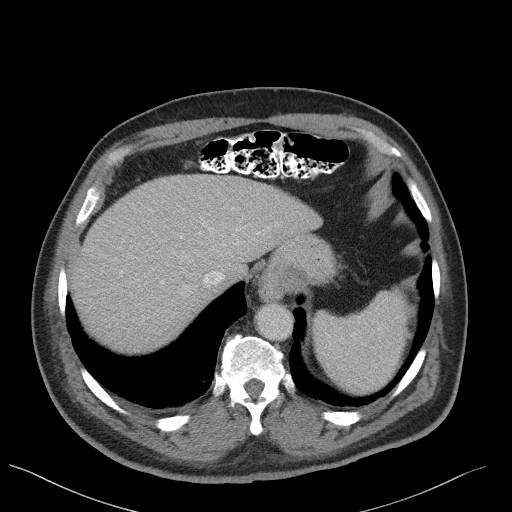
[im 85/92  soft-tissue]
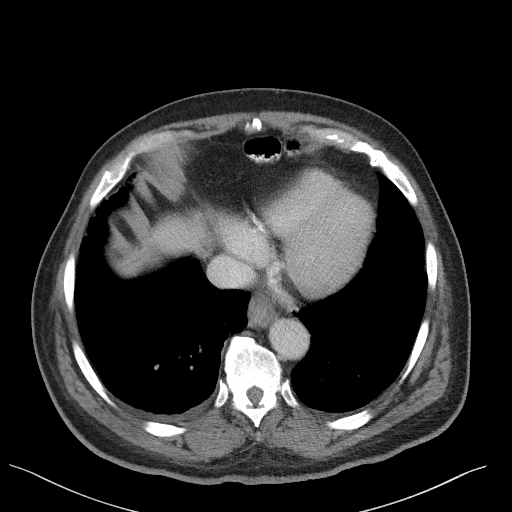

[Series 4: coronal st · coronal · 0.79mm/px · 3 of 89 slices shown]
[im 30/89  soft-tissue]
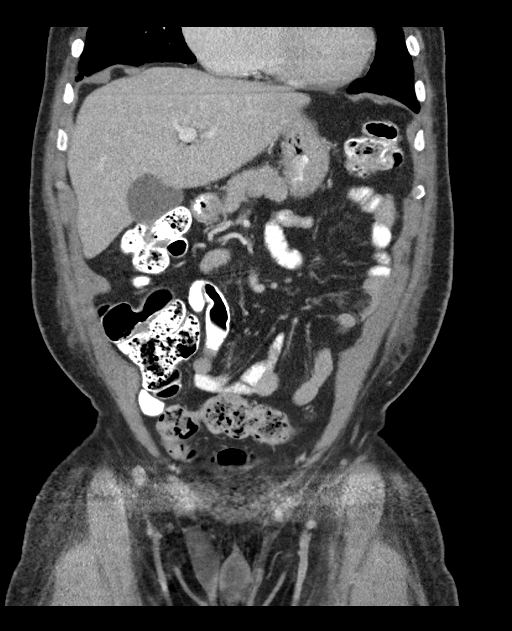
[im 40/89  soft-tissue]
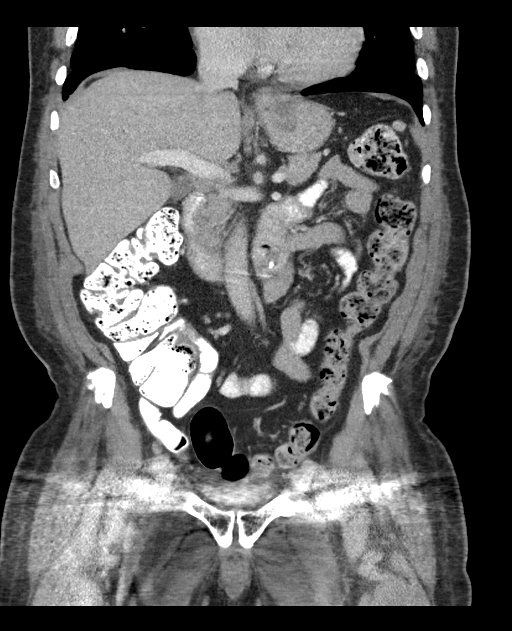
[im 49/89  soft-tissue]
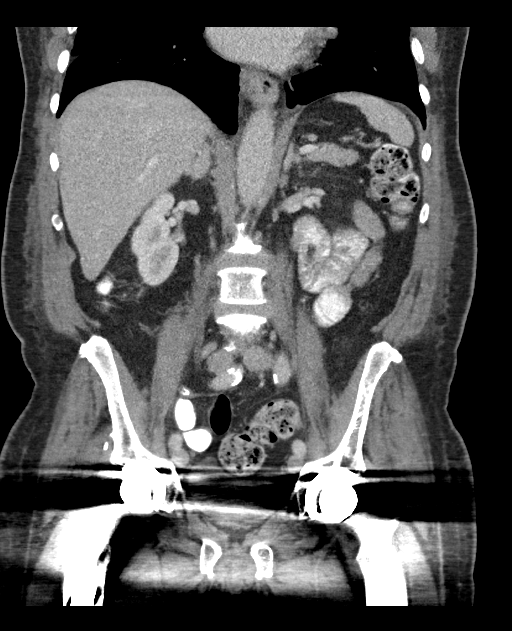

[16 of 46 positions shown; findings below may reference images not displayed]

FINDINGS: Lower chest: 4 mm subpleural left lower lobe nodule, stable. Trace
right pleural effusion. Heart is enlarged. Coronary artery
calcification. No pericardial effusion. Small hernia.

Hepatobiliary: Liver and gallbladder are unremarkable. No biliary
ductal dilatation.

Pancreas: Negative.

Spleen: Negative.

Adrenals/Urinary Tract: Adrenal glands and kidneys are unremarkable.
Ureters are decompressed. Bladder is largely obscured by streak
artifact from bilateral hip arthroplasties.

Stomach/Bowel: Small hiatal hernia. Stomach, small bowel appendix
and colon are otherwise unremarkable.

Vascular/Lymphatic: Atherosclerotic calcification of the aorta
without aneurysm. Retroperitoneal abdominal and pelvic lymph nodes
are subcentimeter in size, as before. Inguinal lymph nodes have
increased in size, now measuring up to 1.5 cm on the left (series 2,
image 90), previously 1.0 cm.

Reproductive: Brachytherapy seeds are seen in the prostate, which is
largely obscured by streak artifact from bilateral hip
arthroplasties.

Other: No free fluid.  Mesenteries and peritoneum are unremarkable.

Musculoskeletal: Bilateral hip arthroplasties. Degenerative changes
in the spine. Faint area of sclerosis in the medial left iliac wing
(56), as before. Partially imaged sclerotic lesion in the inferior
endplate of T8, area not previously imaged on [DATE]. Lesion is
new from [DATE]. Grade 1 anterolisthesis of L4 on L5, before.
IMPRESSION: 1. Bilateral inguinal adenopathy. Lymph nodes have enlarged
[DATE].
2. Partially imaged sclerotic lesion in the inferior endplate of T8,
indicative of metastatic disease.
3. Trace right pleural effusion.
4. Aortic atherosclerosis ([QC]-170.0). Coronary artery
calcification.

## 2018-11-17 MED ORDER — HEPARIN SOD (PORK) LOCK FLUSH 100 UNIT/ML IV SOLN
INTRAVENOUS | Status: AC
  Filled 2018-11-17: qty 5

## 2018-11-17 MED ORDER — IOHEXOL 300 MG/ML  SOLN
100.0000 mL | Freq: Once | INTRAMUSCULAR | Status: AC | PRN
  Administered 2018-11-17: 100 mL via INTRAVENOUS

## 2018-11-17 MED ORDER — TECHNETIUM TC 99M MEDRONATE IV KIT
20.8000 | PACK | Freq: Once | INTRAVENOUS | Status: AC | PRN
  Administered 2018-11-17: 20.8 via INTRAVENOUS

## 2018-11-17 MED ORDER — HEPARIN SOD (PORK) LOCK FLUSH 100 UNIT/ML IV SOLN
500.0000 [IU] | Freq: Once | INTRAVENOUS | Status: AC
  Administered 2018-11-17: 500 [IU] via INTRAVENOUS

## 2018-11-17 MED ORDER — SODIUM CHLORIDE (PF) 0.9 % IJ SOLN
INTRAMUSCULAR | Status: AC
  Filled 2018-11-17: qty 50

## 2018-11-20 ENCOUNTER — Encounter: Payer: Self-pay | Admitting: Radiation Oncology

## 2018-11-20 NOTE — Progress Notes (Signed)
Histology and Location of Primary Cancer: 73 year old man with advanced prostate cancer with disease to the bone that is currently castration-resistant diagnosed in 2018.    Sites of Visceral and Bony Metastatic Disease: Right scapula, left scapula, right anterior rib at the fifth and seventh right Ribs, midthoracic spine and right mandible is again seen.  Location(s) of Symptomatic Metastases: Denies pain or loss of function related to mets  Past/Anticipated chemotherapy by medical oncology, if any:   Prior Therapy: He received definitive therapy using radiation therapy and androgen deprivation in 2016.  He developed advanced disease with pelvic adenopathy and was treated with androgen deprivation while was living in [place].  He subsequently developed castration resistant disease.  Zytiga 1000 mg daily restarted on 03/14/2017.  Therapy discontinued in August 2018 due to progression of disease.  Taxotere chemotherapy at 75 mg/m given every 3 weeks started on the first 2019.  He completed 8 cycles of therapy in January 2020.  Current therapy:   Lupron 30 mg every 4 months.    Pain on a scale of 0-10 is: denies    If Spine Met(s), symptoms, if any, include:  Bowel/Bladder retention or incontinence (please describe): Reports nocturia x 5-6. Denies urinary or bowel retention or incontinence.  Numbness or weakness in extremities (please describe): Weakness resolved when chemo was stopped.   Current Decadron regimen, if applicable: no  Ambulatory status? Walker? Wheelchair?: Ambulatory  SAFETY ISSUES:  Prior radiation? Yes in 2016 in [place]  Pacemaker/ICD? no  Possible current pregnancy? no, male patient  Is the patient on methotrexate? no  Current Complaints / other details:  73 year old male. Referral for consideration of Xofigo.

## 2018-11-21 ENCOUNTER — Other Ambulatory Visit: Payer: Self-pay

## 2018-11-21 ENCOUNTER — Encounter: Payer: Self-pay | Admitting: Radiation Oncology

## 2018-11-21 ENCOUNTER — Ambulatory Visit
Admission: RE | Admit: 2018-11-21 | Discharge: 2018-11-21 | Disposition: A | Discharge status: Home / Self Care | Payer: Medicare Other | Source: Ambulatory Visit | Attending: Radiation Oncology | Admitting: Radiation Oncology

## 2018-11-21 VITALS — BP 133/80 | HR 76 | Temp 98.4°F | Resp 18 | Ht 69.0 in | Wt 207.2 lb

## 2018-11-21 DIAGNOSIS — C7951 Secondary malignant neoplasm of bone: Secondary | ICD-10-CM | POA: Insufficient documentation

## 2018-11-21 DIAGNOSIS — C61 Malignant neoplasm of prostate: Secondary | ICD-10-CM | POA: Diagnosis not present

## 2018-11-21 DIAGNOSIS — Z923 Personal history of irradiation: Secondary | ICD-10-CM | POA: Diagnosis not present

## 2018-11-21 DIAGNOSIS — Z7982 Long term (current) use of aspirin: Secondary | ICD-10-CM | POA: Diagnosis not present

## 2018-11-21 DIAGNOSIS — Z888 Allergy status to other drugs, medicaments and biological substances status: Secondary | ICD-10-CM | POA: Diagnosis not present

## 2018-11-21 DIAGNOSIS — C7952 Secondary malignant neoplasm of bone marrow: Secondary | ICD-10-CM | POA: Diagnosis not present

## 2018-11-21 DIAGNOSIS — I10 Essential (primary) hypertension: Secondary | ICD-10-CM | POA: Diagnosis not present

## 2018-11-21 DIAGNOSIS — Z87891 Personal history of nicotine dependence: Secondary | ICD-10-CM | POA: Diagnosis not present

## 2018-11-21 DIAGNOSIS — Z91010 Allergy to peanuts: Secondary | ICD-10-CM | POA: Diagnosis not present

## 2018-11-21 DIAGNOSIS — Z7952 Long term (current) use of systemic steroids: Secondary | ICD-10-CM | POA: Diagnosis not present

## 2018-11-21 DIAGNOSIS — Z8042 Family history of malignant neoplasm of prostate: Secondary | ICD-10-CM | POA: Diagnosis not present

## 2018-11-21 DIAGNOSIS — R9721 Rising PSA following treatment for malignant neoplasm of prostate: Secondary | ICD-10-CM | POA: Diagnosis not present

## 2018-11-21 DIAGNOSIS — Z79899 Other long term (current) drug therapy: Secondary | ICD-10-CM | POA: Insufficient documentation

## 2018-11-21 DIAGNOSIS — Z192 Hormone resistant malignancy status: Secondary | ICD-10-CM | POA: Diagnosis not present

## 2018-11-21 HISTORY — DX: Malignant neoplasm of prostate: C61

## 2018-11-21 NOTE — Progress Notes (Signed)
Radiation Oncology         [phone] ________________________________  Initial outpatient Consultation  Name: Dennis Fischer MRN: [id number]  Date of Service: 11/21/2018 DOB: 04-02-1946  KK:XFGH, [name], MD  Wyatt Portela, MD   REFERRING PHYSICIAN: Wyatt Portela, MD  DIAGNOSIS: 73 y.o. 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    ICD-10-CM   1. Secondary malignant neoplasm of bone and bone marrow (HCC) C79.51    C79.52   2. Prostate cancer ([place]) C61     HISTORY OF PRESENT ILLNESS: Dennis Fischer is a 73 y.o. male seen at the request of Dr. Alen Blew. He was initially diagnosed with prostate cancer while living in [place] in 2016, with T3a, N1 Gleason 4+3 disease with PSA of 12.8.  He was treated with 5 weeks of external beam radiation therapy in combination with LT- ADT as definitive treatment. His PSA initially responded with a nadir of 1.1 but rose to 12 in July 2017. He was started on Zytiga and prednisone in September 2017 due to evidence of bony metastases on restaging imaging.Marland Kitchen He took this for about a month and chose to discontinue. He moved to [place] around this time and met with Dr. Alen Blew on 07/10/2016 and there was discussion at that time regarding resuming the [facility] but the patient wished to remain in observation only. Unfortunately, his PSA continued to rise to 61.7 with evidence of bony progression on repeat imaging in May 2018. He resumed Zytiga and Lupron on 03/14/2017 and initially had an excellent response with a decrease in his PSA by approximately 50%.  However, the PSA began to slowly rise in November 2018 and was up to 100 on 04/11/2018 so Zytiga was discontinued at that time due to further disease progression. He was subsequently started on Taxotere chemotherapy on 04/30/2018 and completed 8 cycles in January 2020. He continues on Lupron.  Unfortunately, his PSA began to rise again to 48.3 on 11/05/2018, up  from 37.2 on 09/26/2018. Restaging CT abdomen/pelvis and bone scan were performed on 11/17/2018. CT scan showed increase in size of bilateral inguinal adenopathy, and a partially imaged sclerotic lesion in the inferior endplate of T8. Bone scan noted progressive diffuse bony metastases in bilateral scapula, ribs, thoracic spine and right mandible with no new lesions as compared with previous imaging.  He last saw Dr. Alen Blew on 11/06/2018 prior to his restaging scans. He has kindly been referred to [place] for consideration of Xofigo treatment in the management of his progressive bony metastatic disease.   PREVIOUS RADIATION THERAPY: Yes  02/02/2015 - 04/04/2015: Prostate (+boost) / 45 Gy in 25 fractions (+30.6 Gy in 17 fractions) (Dr. Luiz Ochoa at [facility])  [place]:  Past Medical History:  Diagnosis Date  . Hypertension   . Metastatic cancer to bone ([place]) dx'd 2018  . Prostate cancer ([place])       PAST SURGICAL HISTORY: Past Surgical History:  Procedure Laterality Date  . HIP SURGERY Bilateral   . IR IMAGING GUIDED PORT INSERTION  04/22/2018    FAMILY HISTORY:  Family History  Problem Relation Age of Onset  . Prostate cancer Father     SOCIAL HISTORY:  Social History   Socioeconomic History  . Marital status: Significant Other    Spouse name: [name]  . Number of children: 3  . Years of education: Not on file  . Highest education level: Not on file  Occupational History  . Not on file  Social Needs  . Financial resource strain: Not on file  . Food insecurity:    Worry: Not on file    Inability: Not on file  . Transportation needs:    Medical: Not on file    Non-medical: Not on file  Tobacco Use  . Smoking status: Former Smoker    Packs/day: 0.25    Years: 50.00    Pack years: 12.50    Types: Cigarettes    Last attempt to quit: 09/10/2014    Years since quitting: 4.2  . Smokeless tobacco: Never Used  . Tobacco comment: Reports smoking only when he drank.   Substance and Sexual Activity  . Alcohol use: Yes    Comment: Once every 2 weeks.   . Drug use: Yes    Types: Marijuana    Comment: Medical   . Sexual activity: Not Currently  Lifestyle  . Physical activity:    Days per week: Not on file    Minutes per session: Not on file  . Stress: Not on file  Relationships  . Social connections:    Talks on phone: Not on file    Gets together: Not on file    Attends religious service: Not on file    Active member of club or organization: Not on file    Attends meetings of clubs or organizations: Not on file    Relationship status: Not on file  . Intimate partner violence:    Fear of current or ex partner: Not on file    Emotionally abused: Not on file    Physically abused: Not on file    Forced sexual activity: Not on file  Other Topics Concern  . Not on file  Social History Narrative  . Not on file    ALLERGIES: Lisinopril; Mangifera indica; Other; and Peanut-containing drug products  MEDICATIONS:  Current Outpatient Medications  Medication Sig Dispense Refill  . acetaminophen (TYLENOL) 500 MG tablet Take by mouth.    Marland Kitchen amLODipine (NORVASC) 10 MG tablet Take 1 tablet (10 mg total) by mouth daily. 90 tablet 3  . aspirin 81 MG tablet Take 81 mg by mouth daily.    . calcium-vitamin D (OSCAL WITH D) 500-200 MG-UNIT tablet Take 1 tablet by mouth 2 (two) times daily. 60 tablet 3  . carvedilol (COREG) 12.5 MG tablet TAKE 1 TABLET BY MOUTH TWICE DAILY WITH A MEAL 180 tablet 3  . furosemide (LASIX) 20 MG tablet TAKE 1 TABLET(20 MG) BY MOUTH DAILY 90 tablet 0  . lidocaine-prilocaine (EMLA) cream Apply 1 application topically as needed. 30 g 0  . Multiple Vitamin (MULTIVITAMIN) tablet Take 1 tablet by mouth daily.    . NON FORMULARY Trimix 9    . omeprazole (PRILOSEC) 20 MG capsule Take 1 capsule (20 mg total) by mouth daily. 90 capsule 0  . oxybutynin (DITROPAN-XL) 10 MG 24 hr tablet TAKE 1 TABLET(10 MG) BY MOUTH DAILY    . potassium  chloride SA (K-DUR,KLOR-CON) 20 MEQ tablet TAKE 1 TABLET(20 MEQ) BY MOUTH DAILY 90 tablet 0  . sildenafil (VIAGRA) 100 MG tablet Take 1 tablet (100 mg total) by mouth daily as needed for erectile dysfunction. 30 tablet 3  . simvastatin (ZOCOR) 20 MG tablet TAKE 1 TABLET BY MOUTH EVERY DAY 90 tablet 3  . Vitamin D, Ergocalciferol, (DRISDOL) 1.25 MG (50000 UT) CAPS capsule TAKE 1 CAPSULE BY MOUTH 1 TIME A WEEK    . abiraterone acetate (ZYTIGA) 250 MG tablet Take by mouth.    Marland Kitchen  cephALEXin (KEFLEX) 500 MG capsule Take 1 capsule (500 mg total) by mouth 4 (four) times daily. (Patient not taking: Reported on 11/21/2018) 28 capsule 0  . predniSONE (DELTASONE) 5 MG tablet Take by mouth.    . prochlorperazine (COMPAZINE) 10 MG tablet TAKE 1 TABLET BY MOUTH EVERY 6 HOURS AS NEEDED FOR NAUSEA OR VOMITING 368 tablet 3   No current facility-administered medications for this encounter.     REVIEW OF SYSTEMS:  On review of systems, the patient reports that he is doing well overall. He denies any chest pain, shortness of breath, cough, fevers, chills, night sweats, unintended weight changes. He denies any bowel or bladder disturbances, and denies abdominal pain, nausea or vomiting. He denies any new focal musculoskeletal or joint aches or pains. A complete review of systems is obtained and is otherwise negative.  PHYSICAL EXAM:  Wt Readings from Last 3 Encounters:  11/21/18 207 lb 4 oz (94 kg)  11/06/18 219 lb 3.2 oz (99.4 kg)  09/26/18 241 lb (109.3 kg)   Temp Readings from Last 3 Encounters:  11/21/18 98.4 F (36.9 C) (Oral)  11/06/18 98.1 F (36.7 C) (Oral)  09/27/18 98.3 F (36.8 C) (Oral)   BP Readings from Last 3 Encounters:  11/21/18 133/80  11/06/18 131/73  09/27/18 130/82   Pulse Readings from Last 3 Encounters:  11/21/18 76  11/06/18 80  09/27/18 91    In general this is a well appearing African American gentleman in no acute distress. He is alert and oriented x4 and appropriate  throughout the examination. HEENT reveals that the patient is normocephalic, atraumatic. EOMs are intact. PERRLA. Skin is intact without any evidence of gross lesions. Cardiovascular exam reveals a regular rate and rhythm, no clicks rubs or murmurs are auscultated. Chest is clear to auscultation bilaterally. Lymphatic assessment is performed and does not reveal any adenopathy in the cervical, supraclavicular, axillary, or inguinal chains. Abdomen has active bowel sounds in all quadrants and is intact. The abdomen is soft, non tender, non distended. Lower extremities are negative for pretibial pitting edema, deep calf tenderness, cyanosis or clubbing.  KPS = 90  100 - Normal; no complaints; no evidence of disease. 90   - Able to carry on normal activity; minor signs or symptoms of disease. 80   - Normal activity with effort; some signs or symptoms of disease. 16   - Cares for self; unable to carry on normal activity or to do active work. 60   - Requires occasional assistance, but is able to care for most of his personal needs. 50   - Requires considerable assistance and frequent medical care. 27   - Disabled; requires special care and assistance. 27   - Severely disabled; hospital admission is indicated although death not imminent. 51   - Very sick; hospital admission necessary; active supportive treatment necessary. 10   - Moribund; fatal processes progressing rapidly. 0     - Dead  Karnofsky DA, Abelmann Ferney, Craver LS and Burchenal [facility] [phone]) The use of the nitrogen mustards in the palliative treatment of carcinoma: with particular reference to bronchogenic carcinoma Cancer 1 634-56  LABORATORY DATA:  Lab Results  Component Value Date   WBC 5.5 11/05/2018   HGB 9.6 (L) 11/05/2018   HCT 30.6 (L) 11/05/2018   MCV 86.7 11/05/2018   PLT 222 11/05/2018   Lab Results  Component Value Date   NA 141 11/05/2018   K 4.1 11/05/2018   CL 109 11/05/2018   CO2  22 11/05/2018   Lab Results   Component Value Date   ALT 8 11/05/2018   AST 18 11/05/2018   ALKPHOS 139 (H) 11/05/2018   BILITOT 0.5 11/05/2018     RADIOGRAPHY: Nm Bone Scan Whole Body  Result Date: 11/17/2018 CLINICAL DATA:  Metastatic prostate cancer. EXAM: NUCLEAR MEDICINE WHOLE BODY BONE SCAN TECHNIQUE: Whole body anterior and posterior images were obtained approximately 3 hours after intravenous injection of radiopharmaceutical. RADIOPHARMACEUTICALS:  20.8 mCi Technetium-52mMDP IV COMPARISON:  Whole-body bone scan 04/11/2018. FINDINGS: Extensive uptake is again noted in the right scapula. There is a punctate focus uptake again seen posteriorly in the left scapula. Right anterior rib uptake is present at the fifth and seventh right ribs. Left lateral rib uptake is stable. Extensive uptake in the midthoracic spine is similar the prior study. Focal uptake at the right mandible is again seen. IMPRESSION: Multifocal uptake of radiotracer consistent with diffuse osseous metastases. No new lesions are present. Electronically Signed   By: CSan MorelleM.D.   On: 11/17/2018 16:01   Ct Abdomen Pelvis W Contrast  Result Date: 11/17/2018 CLINICAL DATA:  Prostate cancer.  Assess treatment response. EXAM: CT ABDOMEN AND PELVIS WITH CONTRAST TECHNIQUE: Multidetector CT imaging of the abdomen and pelvis was performed using the standard protocol following bolus administration of intravenous contrast. CONTRAST:  1048mOMNIPAQUE IOHEXOL 300 MG/ML  SOLN COMPARISON:  04/11/2018 and 01/18/2017. FINDINGS: Lower chest: 4 mm subpleural left lower lobe nodule, stable. Trace right pleural effusion. Heart is enlarged. Coronary artery calcification. No pericardial effusion. Small hernia. Hepatobiliary: Liver and gallbladder are unremarkable. No biliary ductal dilatation. Pancreas: Negative. Spleen: Negative. Adrenals/Urinary Tract: Adrenal glands and kidneys are unremarkable. Ureters are decompressed. Bladder is largely obscured by streak artifact  from bilateral hip arthroplasties. Stomach/Bowel: Small hiatal hernia. Stomach, small bowel appendix and colon are otherwise unremarkable. Vascular/Lymphatic: Atherosclerotic calcification of the aorta without aneurysm. Retroperitoneal abdominal and pelvic lymph nodes are subcentimeter in size, as before. Inguinal lymph nodes have increased in size, now measuring up to 1.5 cm on the left (series 2, image 90), previously 1.0 cm. Reproductive: Brachytherapy seeds are seen in the prostate, which is largely obscured by streak artifact from bilateral hip arthroplasties. Other: No free fluid.  Mesenteries and peritoneum are unremarkable. Musculoskeletal: Bilateral hip arthroplasties. Degenerative changes in the spine. Faint area of sclerosis in the medial left iliac wing (56), as before. Partially imaged sclerotic lesion in the inferior endplate of T8, area not previously imaged on 04/11/2018. Lesion is new from 01/18/2017. Grade 1 anterolisthesis of L4 on L5, before. IMPRESSION: 1. Bilateral inguinal adenopathy. Lymph nodes have enlarged 04/11/2018. 2. Partially imaged sclerotic lesion in the inferior endplate of T8, indicative of metastatic disease. 3. Trace right pleural effusion. 4. Aortic atherosclerosis (ICD10-170.0). Coronary artery calcification. Electronically Signed   By: MeLorin Picket.D.   On: 11/17/2018 11:00      IMPRESSION/PLAN: 1. [age].o. 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  Today, we talked to the patient and family about the findings and workup thus far. We discussed the natural history of metastatic prostate cancer and general treatment, highlighting the role of Xogifo infusions in the management. We focused on the details of logistics and delivery. The recommendation is to proceed with monthly infusions of Xofigo x6. We will monitor labs prior to each infusion to ensure it is safe to proceed with each treatment. He has a port in place  which will be used to obtain labs  and accessed for infusions.  We reviewed the anticipated acute and late sequelae associated with Xofigo in this setting. The patient was encouraged to ask questions that were answered to his stated satisfaction.  At the end of our conversation, the patient elects to proceed with Xofigo infusions. We will share this information with Dr. Alen Blew and proceed with treatment planning accordingly in anticipation of beginning treatment in the near future.   Nicholos Johns, PA-C    Tyler Pita, MD  [facility] Direct Dial: [phone]  Fax: [phone] [email or web address]  Skype  [email or web address]   This document serves as a record of services personally performed by Tyler Pita, MD and Freeman Caldron, PA-C. It was created on their behalf by Wilburn Mylar, a trained medical scribe. The creation of this record is based on the scribe's personal observations and the provider's statements to them. This document has been checked and approved by the attending provider.

## 2018-11-21 NOTE — Progress Notes (Signed)
See progress note under physician encounter. 

## 2018-11-24 ENCOUNTER — Telehealth: Payer: Self-pay | Admitting: *Deleted

## 2018-11-24 ENCOUNTER — Other Ambulatory Visit (HOSPITAL_COMMUNITY): Payer: Self-pay | Admitting: Radiation Oncology

## 2018-11-24 DIAGNOSIS — C61 Malignant neoplasm of prostate: Secondary | ICD-10-CM

## 2018-11-24 DIAGNOSIS — C7951 Secondary malignant neoplasm of bone: Principal | ICD-10-CM

## 2018-11-24 NOTE — Telephone Encounter (Signed)
CALLED PATIENT TO INFORM OF LAB AND WEIGHT ON 12-09-18 @ 12:15 PM @ [place] AND HIS X. INJ. ON 12-16-18 @ 12:30 PM @ [facility], SPOKE WITH PATIENT AND HE IS AWARE OF THESE APPTS.

## 2018-11-26 ENCOUNTER — Other Ambulatory Visit: Payer: Self-pay | Admitting: Student in an Organized Health Care Education/Training Program

## 2018-11-26 DIAGNOSIS — C61 Malignant neoplasm of prostate: Secondary | ICD-10-CM

## 2018-12-08 ENCOUNTER — Telehealth: Payer: Self-pay | Admitting: *Deleted

## 2018-12-08 ENCOUNTER — Other Ambulatory Visit: Payer: Self-pay | Admitting: Radiation Oncology

## 2018-12-08 DIAGNOSIS — C7952 Secondary malignant neoplasm of bone marrow: Principal | ICD-10-CM

## 2018-12-08 DIAGNOSIS — C7951 Secondary malignant neoplasm of bone: Secondary | ICD-10-CM

## 2018-12-08 NOTE — Telephone Encounter (Signed)
Called patient to remind of lab and weight for 12-09-18 @ 12:15 pm, spoke with patient and he is aware of this appt.

## 2018-12-09 ENCOUNTER — Ambulatory Visit
Admission: RE | Admit: 2018-12-09 | Discharge: 2018-12-09 | Disposition: A | Discharge status: Home / Self Care | Payer: Medicare Other | Source: Ambulatory Visit | Attending: Radiation Oncology | Admitting: Radiation Oncology

## 2018-12-09 ENCOUNTER — Other Ambulatory Visit: Payer: Self-pay

## 2018-12-09 DIAGNOSIS — C7951 Secondary malignant neoplasm of bone: Secondary | ICD-10-CM

## 2018-12-09 DIAGNOSIS — I10 Essential (primary) hypertension: Secondary | ICD-10-CM | POA: Diagnosis not present

## 2018-12-09 DIAGNOSIS — C61 Malignant neoplasm of prostate: Secondary | ICD-10-CM | POA: Diagnosis not present

## 2018-12-09 DIAGNOSIS — Z8042 Family history of malignant neoplasm of prostate: Secondary | ICD-10-CM | POA: Diagnosis not present

## 2018-12-09 DIAGNOSIS — C7952 Secondary malignant neoplasm of bone marrow: Secondary | ICD-10-CM | POA: Diagnosis not present

## 2018-12-09 DIAGNOSIS — Z87891 Personal history of nicotine dependence: Secondary | ICD-10-CM | POA: Diagnosis not present

## 2018-12-09 LAB — CBC WITH DIFFERENTIAL (CANCER CENTER ONLY)
Abs Immature Granulocytes: 0.01 10*3/uL (ref 0.00–0.07)
Basophils Absolute: 0 10*3/uL (ref 0.0–0.1)
Basophils Relative: 1 %
Eosinophils Absolute: 0.4 10*3/uL (ref 0.0–0.5)
Eosinophils Relative: 6 %
HCT: 36.6 % — ABNORMAL LOW (ref 39.0–52.0)
Hemoglobin: 11.4 g/dL — ABNORMAL LOW (ref 13.0–17.0)
Immature Granulocytes: 0 %
Lymphocytes Relative: 34 %
Lymphs Abs: 2.1 10*3/uL (ref 0.7–4.0)
MCH: 27 pg (ref 26.0–34.0)
MCHC: 31.1 g/dL (ref 30.0–36.0)
MCV: 86.5 fL (ref 80.0–100.0)
Monocytes Absolute: 0.4 10*3/uL (ref 0.1–1.0)
Monocytes Relative: 7 %
Neutro Abs: 3.1 10*3/uL (ref 1.7–7.7)
Neutrophils Relative %: 52 %
Platelet Count: 256 10*3/uL (ref 150–400)
RBC: 4.23 MIL/uL (ref 4.22–5.81)
RDW: 17.6 % — ABNORMAL HIGH (ref 11.5–15.5)
WBC Count: 6 10*3/uL (ref 4.0–10.5)
nRBC: 0 % (ref 0.0–0.2)

## 2018-12-15 ENCOUNTER — Telehealth: Payer: Self-pay | Admitting: *Deleted

## 2018-12-15 NOTE — Telephone Encounter (Signed)
CALLED PATIENT TO REMIND OF XOFIGO INJ. - ARRIVAL TIME- 11:45 AM ON 12-16-18 @ [facility], SPOKE WITH PATIENT AND HE IS AWARE OF THIS INJ.

## 2018-12-16 ENCOUNTER — Other Ambulatory Visit: Payer: Medicare Other

## 2018-12-16 ENCOUNTER — Ambulatory Visit (HOSPITAL_COMMUNITY)
Admission: RE | Admit: 2018-12-16 | Discharge: 2018-12-16 | Disposition: A | Discharge status: Home / Self Care | Payer: Medicare Other | Source: Ambulatory Visit | Attending: Radiation Oncology | Admitting: Radiation Oncology

## 2018-12-16 ENCOUNTER — Ambulatory Visit: Payer: Medicare Other | Admitting: Oncology

## 2018-12-16 ENCOUNTER — Other Ambulatory Visit: Payer: Self-pay

## 2018-12-16 ENCOUNTER — Ambulatory Visit (HOSPITAL_COMMUNITY): Payer: Medicare Other

## 2018-12-16 DIAGNOSIS — C61 Malignant neoplasm of prostate: Secondary | ICD-10-CM

## 2018-12-16 DIAGNOSIS — C7951 Secondary malignant neoplasm of bone: Secondary | ICD-10-CM | POA: Diagnosis not present

## 2018-12-16 MED ORDER — RADIUM RA 223 DICHLORIDE 30 MCCI/ML IV SOLN
132.7700 | Freq: Once | INTRAVENOUS | Status: AC | PRN
  Administered 2018-12-16: 13:00:00 132.77 via INTRAVENOUS

## 2018-12-16 NOTE — Progress Notes (Signed)
  Radiation Oncology         [phone] ________________________________  Name: Dennis Fischer MRN: [id number]  Date: 12/16/2018  DOB: 1945-10-20  Radium-223 Infusion Note  Diagnosis:  Castration resistant prostate cancer with painful bone involvement  Current Infusion:    1  Planned Infusions:  6  Narrative: Dennis Fischer presented to nuclear medicine for treatment. His most recent blood counts were reviewed.  He remains a good candidate to proceed with Ra-223.  The patient was situated in an infusion suite with a contact barrier placed under his arm. Intravenous access was established, using sterile technique, and a normal saline infusion from a syringe was started.  Micro-dosimetry:  The prescribed radiation activity was assayed and confirmed to be within specified tolerance.  Special Treatment Procedure - Infusion:  The nuclear medicine technologist and I personally verified the dose activity to be delivered as specified in the written directive, and verified the patient identification via 2 separate methods.  The syringe containing the dose was attached to an intravenous access and the dose delivered over a minute. No complications were noted.  The total administered dose was 135.9 microcuries in a volume of 5 cc.   A saline flush of the line and the syringe that contained the isotope was then performed.  The residual radioactivity in the syringe was 3.13 microcuries, so the actual infused isotope activity was 132.77 microcuries.   Pressure was applied to the venipuncture site, and a compression bandage placed.   Radiation Safety personnel were present to perform the discharge survey, as detailed on their documentation.   After a short period of observation, the patient had his IV removed.  Impression:  The patient tolerated his infusion relatively well.  Plan:  The patient will return in one month for ongoing care.    ________________________________  Sheral Apley. Tammi Klippel, M.D.   This document serves as a record of services personally performed by Tyler Pita, MD. It was created on his behalf by Rae Lips, a trained medical scribe. The creation of this record is based on the scribe's personal observations and the provider's statements to them. This document has been checked and approved by the attending provider.

## 2018-12-22 DIAGNOSIS — C61 Malignant neoplasm of prostate: Secondary | ICD-10-CM | POA: Insufficient documentation

## 2018-12-22 DIAGNOSIS — C7951 Secondary malignant neoplasm of bone: Secondary | ICD-10-CM

## 2018-12-23 ENCOUNTER — Other Ambulatory Visit (HOSPITAL_COMMUNITY): Payer: Self-pay | Admitting: Radiation Oncology

## 2018-12-23 ENCOUNTER — Telehealth: Payer: Self-pay | Admitting: *Deleted

## 2018-12-23 DIAGNOSIS — C61 Malignant neoplasm of prostate: Secondary | ICD-10-CM

## 2018-12-23 DIAGNOSIS — C7951 Secondary malignant neoplasm of bone: Principal | ICD-10-CM

## 2018-12-23 NOTE — Telephone Encounter (Signed)
CALLED PATIENT TO INFORM OF LAB AND WEIGHT FOR 01-09-19 @ 12:15 PM @ [place]. ON 01-16-19 - ARRIVAL TIME - 11:45 AM @ [facility], SPOKE WITH PATIENT AND HE IS AWARE OF THESE APPTS.

## 2018-12-25 ENCOUNTER — Other Ambulatory Visit: Payer: Self-pay | Admitting: Oncology

## 2019-01-09 ENCOUNTER — Ambulatory Visit: Payer: Medicare Other

## 2019-01-12 ENCOUNTER — Other Ambulatory Visit: Payer: Self-pay | Admitting: Radiation Oncology

## 2019-01-12 ENCOUNTER — Telehealth: Payer: Self-pay | Admitting: *Deleted

## 2019-01-12 DIAGNOSIS — C7952 Secondary malignant neoplasm of bone marrow: Principal | ICD-10-CM

## 2019-01-12 DIAGNOSIS — C7951 Secondary malignant neoplasm of bone: Secondary | ICD-10-CM

## 2019-01-12 NOTE — Telephone Encounter (Signed)
CALLED PATIENT TO REMIND OF LAB AND WEIGHT APPT. FOR 01-13-19 - ARRIVAL TIME - 11:45 AM @ [place], SPOKE WITH PATIENT AND HE IS AWARE OF THIS APPT.

## 2019-01-13 ENCOUNTER — Ambulatory Visit
Admission: RE | Admit: 2019-01-13 | Discharge: 2019-01-13 | Disposition: A | Discharge status: Home / Self Care | Payer: Medicare Other | Source: Ambulatory Visit | Attending: Radiation Oncology | Admitting: Radiation Oncology

## 2019-01-13 ENCOUNTER — Inpatient Hospital Stay: Payer: Medicare Other | Attending: Oncology

## 2019-01-13 ENCOUNTER — Inpatient Hospital Stay (HOSPITAL_BASED_OUTPATIENT_CLINIC_OR_DEPARTMENT_OTHER): Payer: Medicare Other | Admitting: Oncology

## 2019-01-13 ENCOUNTER — Other Ambulatory Visit: Payer: Self-pay

## 2019-01-13 ENCOUNTER — Inpatient Hospital Stay: Payer: Medicare Other

## 2019-01-13 VITALS — BP 126/87 | HR 95 | Temp 98.7°F | Resp 18 | Ht 69.0 in | Wt 193.0 lb

## 2019-01-13 DIAGNOSIS — R531 Weakness: Secondary | ICD-10-CM | POA: Diagnosis not present

## 2019-01-13 DIAGNOSIS — C7951 Secondary malignant neoplasm of bone: Secondary | ICD-10-CM

## 2019-01-13 DIAGNOSIS — G629 Polyneuropathy, unspecified: Secondary | ICD-10-CM | POA: Insufficient documentation

## 2019-01-13 DIAGNOSIS — M48061 Spinal stenosis, lumbar region without neurogenic claudication: Secondary | ICD-10-CM | POA: Insufficient documentation

## 2019-01-13 DIAGNOSIS — Z91010 Allergy to peanuts: Secondary | ICD-10-CM | POA: Insufficient documentation

## 2019-01-13 DIAGNOSIS — M4316 Spondylolisthesis, lumbar region: Secondary | ICD-10-CM | POA: Insufficient documentation

## 2019-01-13 DIAGNOSIS — Z8042 Family history of malignant neoplasm of prostate: Secondary | ICD-10-CM | POA: Insufficient documentation

## 2019-01-13 DIAGNOSIS — Z7982 Long term (current) use of aspirin: Secondary | ICD-10-CM | POA: Insufficient documentation

## 2019-01-13 DIAGNOSIS — D649 Anemia, unspecified: Secondary | ICD-10-CM | POA: Insufficient documentation

## 2019-01-13 DIAGNOSIS — I10 Essential (primary) hypertension: Secondary | ICD-10-CM | POA: Insufficient documentation

## 2019-01-13 DIAGNOSIS — C7952 Secondary malignant neoplasm of bone marrow: Secondary | ICD-10-CM | POA: Insufficient documentation

## 2019-01-13 DIAGNOSIS — Z7952 Long term (current) use of systemic steroids: Secondary | ICD-10-CM | POA: Insufficient documentation

## 2019-01-13 DIAGNOSIS — Z79899 Other long term (current) drug therapy: Secondary | ICD-10-CM | POA: Insufficient documentation

## 2019-01-13 DIAGNOSIS — C61 Malignant neoplasm of prostate: Secondary | ICD-10-CM

## 2019-01-13 DIAGNOSIS — R296 Repeated falls: Secondary | ICD-10-CM | POA: Diagnosis not present

## 2019-01-13 DIAGNOSIS — Z87891 Personal history of nicotine dependence: Secondary | ICD-10-CM | POA: Insufficient documentation

## 2019-01-13 DIAGNOSIS — Z888 Allergy status to other drugs, medicaments and biological substances status: Secondary | ICD-10-CM | POA: Insufficient documentation

## 2019-01-13 LAB — CBC WITH DIFFERENTIAL (CANCER CENTER ONLY)
Abs Immature Granulocytes: 0 10*3/uL (ref 0.00–0.07)
Basophils Absolute: 0 10*3/uL (ref 0.0–0.1)
Basophils Relative: 0 %
Eosinophils Absolute: 0.1 10*3/uL (ref 0.0–0.5)
Eosinophils Relative: 2 %
HCT: 35.6 % — ABNORMAL LOW (ref 39.0–52.0)
Hemoglobin: 11.6 g/dL — ABNORMAL LOW (ref 13.0–17.0)
Immature Granulocytes: 0 %
Lymphocytes Relative: 45 %
Lymphs Abs: 1.7 10*3/uL (ref 0.7–4.0)
MCH: 27.5 pg (ref 26.0–34.0)
MCHC: 32.6 g/dL (ref 30.0–36.0)
MCV: 84.4 fL (ref 80.0–100.0)
Monocytes Absolute: 0.3 10*3/uL (ref 0.1–1.0)
Monocytes Relative: 7 %
Neutro Abs: 1.7 10*3/uL (ref 1.7–7.7)
Neutrophils Relative %: 46 %
Platelet Count: 275 10*3/uL (ref 150–400)
RBC: 4.22 MIL/uL (ref 4.22–5.81)
RDW: 19.1 % — ABNORMAL HIGH (ref 11.5–15.5)
WBC Count: 3.8 10*3/uL — ABNORMAL LOW (ref 4.0–10.5)
nRBC: 0 % (ref 0.0–0.2)

## 2019-01-13 LAB — CMP (CANCER CENTER ONLY)
ALT: 14 U/L (ref 0–44)
AST: 14 U/L — ABNORMAL LOW (ref 15–41)
Albumin: 3.5 g/dL (ref 3.5–5.0)
Alkaline Phosphatase: 116 U/L (ref 38–126)
Anion gap: 10 (ref 5–15)
BUN: 17 mg/dL (ref 8–23)
CO2: 23 mmol/L (ref 22–32)
Calcium: 9 mg/dL (ref 8.9–10.3)
Chloride: 108 mmol/L (ref 98–111)
Creatinine: 1.11 mg/dL (ref 0.61–1.24)
GFR, Est AFR Am: >60 mL/min (ref >60)
GFR, Estimated: >60 mL/min (ref >60)
Glucose, Bld: 111 mg/dL — ABNORMAL HIGH (ref 70–99)
Potassium: 4 mmol/L (ref 3.5–5.1)
Sodium: 141 mmol/L (ref 135–145)
Total Bilirubin: <0.2 mg/dL — ABNORMAL LOW (ref 0.3–1.2)
Total Protein: 7.6 g/dL (ref 6.5–8.1)

## 2019-01-13 NOTE — Progress Notes (Signed)
Hematology and Oncology Follow Up Visit  Dennis Fischer [id number] March 03, 1946 73 y.o. 01/13/2019 3:45 PM Dennis Fischer, Dennis Fischer, Dennis Purpura, MD   Principle Diagnosis: 73 year old man with castration-resistant prostate cancer with disease to the bone diagnosed in 2018.      Prior Therapy: He received definitive therapy using radiation therapy and androgen deprivation in 2016.  He developed advanced disease with pelvic adenopathy and was treated with androgen deprivation while was living in [place].  He subsequently developed castration resistant disease.  Zytiga 1000 mg daily restarted on 03/14/2017.  Therapy discontinued in August 2018 due to progression of disease.  Taxotere chemotherapy at 75 mg/m given every 3 weeks started on the first 2019.  He completed 8 cycles of therapy in January 2020.  Current therapy:   Lupron 30 mg every 4 months.    Xofigo started on December 16, 2018.  His next infusion of 6 to be given on Jan 20, 2019.    Interim History:  Dennis Fischer for a follow-up visit.  He continues to have residual complications related to chemotherapy including peripheral neuropathy and pain in his lower extremities.  He is still able to ambulate with the help of a cane without any falls or syncope.  His appetite and performance status remains unchanged.  He denies any worsening bone pain or pathological fractures.  His quality of life is unchanged.   He denied headaches, blurry vision, syncope or seizures.  Denies any fevers, chills or sweats.  Denied chest pain, palpitation, orthopnea or leg edema.  Denied cough, wheezing or hemoptysis.  Denied nausea, vomiting or abdominal pain.  Denies any constipation or diarrhea.  Denies any frequency urgency or hesitancy.  Denies any arthralgias or myalgias.  Denies any skin rashes or lesions.  Denies any bleeding or clotting tendency.  Denies any easy bruising.  Denies any hair or nail changes.  Denies any anxiety or depression.  Remaining  review of system is negative.     Medications: I have reviewed the patient's current medications.  Current Outpatient Medications  Medication Sig Dispense Refill  . abiraterone acetate (ZYTIGA) 250 MG tablet Take by mouth.    Marland Kitchen acetaminophen (TYLENOL) 500 MG tablet Take by mouth.    Marland Kitchen amLODipine (NORVASC) 10 MG tablet Take 1 tablet (10 mg total) by mouth daily. 90 tablet 3  . aspirin 81 MG tablet Take 81 mg by mouth daily.    . calcium-vitamin D (OSCAL WITH D) 500-200 MG-UNIT tablet Take 1 tablet by mouth 2 (two) times daily. 60 tablet 3  . carvedilol (COREG) 12.5 MG tablet TAKE 1 TABLET BY MOUTH TWICE DAILY WITH A MEAL 180 tablet 3  . cephALEXin (KEFLEX) 500 MG capsule Take 1 capsule (500 mg total) by mouth 4 (four) times daily. (Patient not taking: Reported on 11/21/2018) 28 capsule 0  . furosemide (LASIX) 20 MG tablet TAKE 1 TABLET(20 MG) BY MOUTH DAILY 90 tablet 0  . lidocaine-prilocaine (EMLA) cream Apply 1 application topically as needed. 30 g 0  . Multiple Vitamin (MULTIVITAMIN) tablet Take 1 tablet by mouth daily.    . NON FORMULARY Trimix 9    . omeprazole (PRILOSEC) 20 MG capsule TAKE 1 CAPSULE(20 MG) BY MOUTH DAILY 90 capsule 0  . oxybutynin (DITROPAN-XL) 10 MG 24 hr tablet TAKE 1 TABLET(10 MG) BY MOUTH DAILY    . potassium chloride SA (K-DUR,KLOR-CON) 20 MEQ tablet TAKE 1 TABLET(20 MEQ) BY MOUTH DAILY 90 tablet 0  . predniSONE (DELTASONE) 5 MG tablet Take by  mouth.    . prochlorperazine (COMPAZINE) 10 MG tablet TAKE 1 TABLET BY MOUTH EVERY 6 HOURS AS NEEDED FOR NAUSEA OR VOMITING 368 tablet 3  . sildenafil (VIAGRA) 100 MG tablet Take 1 tablet (100 mg total) by mouth daily as needed for erectile dysfunction. 30 tablet 3  . simvastatin (ZOCOR) 20 MG tablet TAKE 1 TABLET BY MOUTH EVERY DAY 90 tablet 3  . Vitamin D, Ergocalciferol, (DRISDOL) 1.25 MG (50000 UT) CAPS capsule TAKE 1 CAPSULE BY MOUTH 1 TIME A WEEK     No current facility-administered medications for this visit.       Allergies:  Allergies  Allergen Reactions  . Lisinopril Swelling    Facial swelling  . Mangifera Indica     MANGO --  . Other   . Peanut-Containing Drug Products     Past Medical History, Surgical history, Social history, and Family History reviewed and unchanged at this time.    Physical Exam:  Blood pressure 126/87, pulse 95, temperature 98.7 F (37.1 C), temperature source Oral, resp. rate 18, height 5\' 9"  (1.753 m), weight 193 lb (87.5 kg), SpO2 100 %.     ECOG: 1   General appearance: Comfortable appearing without any discomfort Head: Normocephalic without any trauma Oropharynx: Mucous membranes are moist and pink without any thrush or ulcers. Eyes: Pupils are equal and round reactive to light. Lymph nodes: No cervical, supraclavicular, inguinal or axillary lymphadenopathy.   Heart:regular rate and rhythm.  S1 and S2 without leg edema. Lung: Clear without any rhonchi or wheezes.  No dullness to percussion. Abdomin: Soft, nontender, nondistended with good bowel sounds.  No hepatosplenomegaly. Musculoskeletal: No joint deformity or effusion.  Full range of motion noted. Neurological: No deficits noted on motor, sensory and deep tendon reflex exam. Skin: No petechial rash or dryness.  Appeared moist.             Lab Results: Lab Results  Component Value Date   WBC 6.0 12/09/2018   HGB 11.4 (L) 12/09/2018   HCT 36.6 (L) 12/09/2018   MCV 86.5 12/09/2018   PLT 256 12/09/2018     Chemistry      Component Value Date/Time   NA 141 11/05/2018 0746   NA 141 08/08/2017 1116   K 4.1 11/05/2018 0746   K 3.6 08/08/2017 1116   CL 109 11/05/2018 0746   CO2 22 11/05/2018 0746   CO2 24 08/08/2017 1116   BUN 14 11/05/2018 0746   BUN 13.2 08/08/2017 1116   CREATININE 0.85 11/05/2018 0746   CREATININE 1.1 08/08/2017 1116      Component Value Date/Time   CALCIUM 8.5 (L) 11/05/2018 0746   CALCIUM 9.1 08/08/2017 1116   ALKPHOS 139 (H) 11/05/2018 0746    ALKPHOS 214 (H) 08/08/2017 1116   AST 18 11/05/2018 0746   AST 17 08/08/2017 1116   ALT 8 11/05/2018 0746   ALT 17 08/08/2017 1116   BILITOT 0.5 11/05/2018 0746   BILITOT 0.29 08/08/2017 1116         Results for RAYDIN, BIELINSKI (MRN [id number]) as of 01/13/2019 15:48  Ref. Range 11/05/2018 07:46  Prostate Specific Ag, Serum Latest Ref Range: 0.0 - 4.0 ng/mL 48.3 (H)        Impression and Plan:  73 year old man with:   1.  Castration-resistant prostate cancer with disease to the bone diagnosed in 2018.   He is currently on Xofigo and has not reported any major complaints at this time.  Risks and benefits of  continuing this treatment was discussed and he is agreeable to continue.  The plan is to complete 6 months of therapy.  Different salvage therapy options were reviewed which include systemic chemotherapy.  He still has some residual toxicity from Taxotere and would like to defer option of chemotherapy unless needed.    2. Androgen depravation: Risks and benefits of continuing Lupron long-term was discussed.  These complications including weight gain, hot flashes among others.  His next injection will be in the end of June.  3.  Anemia: His hemoglobin is pending from Fischer and anemia is related to malignancy.  We will continue to monitor that on Xofigo.  His hemoglobin did improve after stopping chemotherapy.  4.  Goals of care: His disease remains incurable but aggressive therapy is warranted given his reasonable performance status.  5.  Bone directed therapy: He is on calcium and vitamin D supplements without any issues.  6.  IV access: Port-A-Cath will be flushed periodically.  No issues reported at this time.  7. Follow-up: in 8 weeks for repeat evaluation and Lupron injection.  25  minutes was spent with the patient face-to-face Fischer.  More than 50% of time was dedicated to discussing his disease status, treatment options and answering questions regarding future plan  of care.Zola Button, MD 5/5/20203:45 PM

## 2019-01-14 ENCOUNTER — Telehealth: Payer: Self-pay | Admitting: Oncology

## 2019-01-14 NOTE — Telephone Encounter (Signed)
Scheduled June appt per sch msg. Mailed printout  °

## 2019-01-16 ENCOUNTER — Inpatient Hospital Stay (HOSPITAL_COMMUNITY): Admission: RE | Admit: 2019-01-16 | Payer: Medicare Other | Source: Ambulatory Visit

## 2019-01-19 ENCOUNTER — Telehealth: Payer: Self-pay | Admitting: *Deleted

## 2019-01-19 NOTE — Telephone Encounter (Signed)
CALLED PATIENT TO REMIND OF XOFIGO INJ. FOR 01-20-19 - ARRIVAL TIME- 11:45 AM @ [facility], SPOKE WITH PATIENT AND HE IS AWARE OF THIS INJ.

## 2019-01-20 ENCOUNTER — Other Ambulatory Visit: Payer: Self-pay

## 2019-01-20 ENCOUNTER — Ambulatory Visit (HOSPITAL_COMMUNITY)
Admission: RE | Admit: 2019-01-20 | Discharge: 2019-01-20 | Disposition: A | Discharge status: Home / Self Care | Payer: Medicare Other | Source: Ambulatory Visit | Attending: Radiation Oncology | Admitting: Radiation Oncology

## 2019-01-20 DIAGNOSIS — C61 Malignant neoplasm of prostate: Secondary | ICD-10-CM | POA: Insufficient documentation

## 2019-01-20 DIAGNOSIS — C7951 Secondary malignant neoplasm of bone: Secondary | ICD-10-CM | POA: Diagnosis not present

## 2019-01-20 MED ORDER — RADIUM RA 223 DICHLORIDE 30 MCCI/ML IV SOLN
127.8000 | Freq: Once | INTRAVENOUS | Status: AC | PRN
  Administered 2019-01-20: 127.8 via INTRAVENOUS

## 2019-01-22 ENCOUNTER — Telehealth: Payer: Self-pay | Admitting: *Deleted

## 2019-01-22 ENCOUNTER — Other Ambulatory Visit (HOSPITAL_COMMUNITY): Payer: Self-pay | Admitting: Radiation Oncology

## 2019-01-22 DIAGNOSIS — C61 Malignant neoplasm of prostate: Secondary | ICD-10-CM

## 2019-01-22 DIAGNOSIS — C7951 Secondary malignant neoplasm of bone: Secondary | ICD-10-CM

## 2019-01-22 NOTE — Telephone Encounter (Signed)
CALLED PATIENT TO REMIND OF LAB AND WEIGHT ON 02-17-19 @ 12 PM @ [facility]. ON 02-24-19- ARRIVAL TIME- 11:45 AM @ [facility], LVM  FOR A RETURN CALL

## 2019-01-23 ENCOUNTER — Ambulatory Visit (HOSPITAL_BASED_OUTPATIENT_CLINIC_OR_DEPARTMENT_OTHER): Payer: Medicare Other | Admitting: Medical

## 2019-01-23 ENCOUNTER — Telehealth: Payer: Self-pay | Admitting: Medical

## 2019-01-23 ENCOUNTER — Telehealth: Payer: Self-pay | Admitting: *Deleted

## 2019-01-23 ENCOUNTER — Other Ambulatory Visit: Payer: Self-pay | Admitting: Medical

## 2019-01-23 ENCOUNTER — Telehealth: Payer: Self-pay

## 2019-01-23 DIAGNOSIS — R6889 Other general symptoms and signs: Secondary | ICD-10-CM

## 2019-01-23 DIAGNOSIS — D649 Anemia, unspecified: Secondary | ICD-10-CM

## 2019-01-23 DIAGNOSIS — C7951 Secondary malignant neoplasm of bone: Secondary | ICD-10-CM

## 2019-01-23 DIAGNOSIS — G629 Polyneuropathy, unspecified: Secondary | ICD-10-CM

## 2019-01-23 DIAGNOSIS — C61 Malignant neoplasm of prostate: Secondary | ICD-10-CM

## 2019-01-23 NOTE — Progress Notes (Signed)
Erroneous encounter

## 2019-01-23 NOTE — Telephone Encounter (Signed)
Called patient to inform that he needs to speak with Dr. Hazeline Junker nurse regarding the fact of his feet feeling like lead weights and his falling, spoke with patient and he verified understanding this

## 2019-01-23 NOTE — Telephone Encounter (Signed)
Scheduled appt per 5/15 sch message - pt is aware of appt date and time

## 2019-01-23 NOTE — Telephone Encounter (Signed)
Received a call from the patient stating that his peripheral neuropathy has worsened and his feet and lower extremities are now numb. He states that he has a difficult time standing and ambulating and now uses a walker. He reports falling 8/9 times in the past 2 weeks. Made Dr. Alen Blew aware and [facility] visit recommended and Sandi Mealy PA made aware. Patient stated that he is not able to come today because his wife is out. He is agreeable to be seen in [facility] on Monday and is aware that he will be called with appt time.

## 2019-01-26 ENCOUNTER — Inpatient Hospital Stay: Payer: Medicare Other

## 2019-01-26 ENCOUNTER — Ambulatory Visit (HOSPITAL_COMMUNITY)
Admission: RE | Admit: 2019-01-26 | Discharge: 2019-01-26 | Disposition: A | Discharge status: Home / Self Care | Payer: Medicare Other | Source: Ambulatory Visit | Attending: Medical | Admitting: Medical

## 2019-01-26 ENCOUNTER — Other Ambulatory Visit: Payer: Self-pay

## 2019-01-26 ENCOUNTER — Inpatient Hospital Stay (HOSPITAL_BASED_OUTPATIENT_CLINIC_OR_DEPARTMENT_OTHER): Payer: Medicare Other | Admitting: Medical

## 2019-01-26 VITALS — BP 127/82 | HR 96 | Temp 98.9°F | Resp 18 | Ht 69.0 in | Wt 187.4 lb

## 2019-01-26 DIAGNOSIS — Z79899 Other long term (current) drug therapy: Secondary | ICD-10-CM

## 2019-01-26 DIAGNOSIS — C7951 Secondary malignant neoplasm of bone: Secondary | ICD-10-CM

## 2019-01-26 DIAGNOSIS — D649 Anemia, unspecified: Secondary | ICD-10-CM | POA: Diagnosis not present

## 2019-01-26 DIAGNOSIS — G629 Polyneuropathy, unspecified: Secondary | ICD-10-CM

## 2019-01-26 DIAGNOSIS — C61 Malignant neoplasm of prostate: Secondary | ICD-10-CM

## 2019-01-26 DIAGNOSIS — R296 Repeated falls: Secondary | ICD-10-CM | POA: Insufficient documentation

## 2019-01-26 DIAGNOSIS — S3992XA Unspecified injury of lower back, initial encounter: Secondary | ICD-10-CM | POA: Diagnosis not present

## 2019-01-26 DIAGNOSIS — R29898 Other symptoms and signs involving the musculoskeletal system: Secondary | ICD-10-CM | POA: Insufficient documentation

## 2019-01-26 DIAGNOSIS — R531 Weakness: Secondary | ICD-10-CM | POA: Diagnosis not present

## 2019-01-26 DIAGNOSIS — R6889 Other general symptoms and signs: Secondary | ICD-10-CM

## 2019-01-26 LAB — CBC WITH DIFFERENTIAL (CANCER CENTER ONLY)
Abs Immature Granulocytes: 0.01 10*3/uL (ref 0.00–0.07)
Basophils Absolute: 0 10*3/uL (ref 0.0–0.1)
Basophils Relative: 0 %
Eosinophils Absolute: 0.1 10*3/uL (ref 0.0–0.5)
Eosinophils Relative: 2 %
HCT: 38.9 % — ABNORMAL LOW (ref 39.0–52.0)
Hemoglobin: 12.6 g/dL — ABNORMAL LOW (ref 13.0–17.0)
Immature Granulocytes: 0 %
Lymphocytes Relative: 23 %
Lymphs Abs: 1.1 10*3/uL (ref 0.7–4.0)
MCH: 27.3 pg (ref 26.0–34.0)
MCHC: 32.4 g/dL (ref 30.0–36.0)
MCV: 84.4 fL (ref 80.0–100.0)
Monocytes Absolute: 0.3 10*3/uL (ref 0.1–1.0)
Monocytes Relative: 5 %
Neutro Abs: 3.4 10*3/uL (ref 1.7–7.7)
Neutrophils Relative %: 70 %
Platelet Count: 261 10*3/uL (ref 150–400)
RBC: 4.61 MIL/uL (ref 4.22–5.81)
RDW: 19.9 % — ABNORMAL HIGH (ref 11.5–15.5)
WBC Count: 4.8 10*3/uL (ref 4.0–10.5)
nRBC: 0 % (ref 0.0–0.2)

## 2019-01-26 LAB — VITAMIN B12: Vitamin B-12: 414 pg/mL (ref 180–914)

## 2019-01-26 LAB — FOLATE: Folate: 30.1 ng/mL (ref >5.9)

## 2019-01-26 IMAGING — MR MRI LUMBAR SPINE WITHOUT AND WITH CONTRAST
4 of 7 series · 19 of 48 positions shown · IV contrast (Yes)
Comparison: Whole body bone scan [DATE]

CLINICAL DATA: Recurrent falls. Neuropathy. Right leg weakness.
Prostate cancer

EXAM:
MRI LUMBAR SPINE WITHOUT AND WITH CONTRAST
TECHNIQUE: Multiplanar and multiecho pulse sequences of the lumbar spine were
obtained without and with intravenous contrast.
CONTRAST:  9 mL Gadovist IV

[Series 2: T1 · sagittal · 4.0mm · 0.51mm/px · 3 of 14 slices shown (1 of 2)]
[im 1/14]
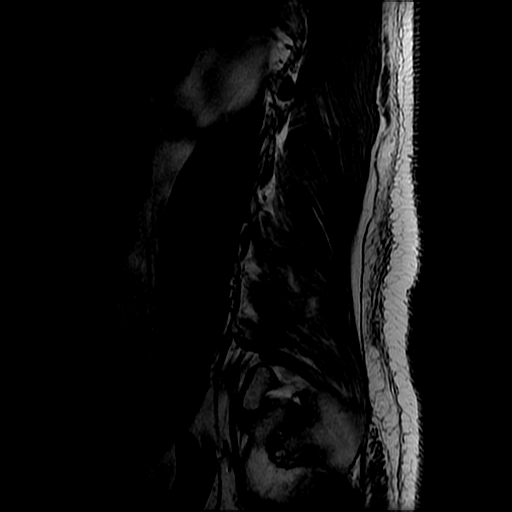
[im 7/14]
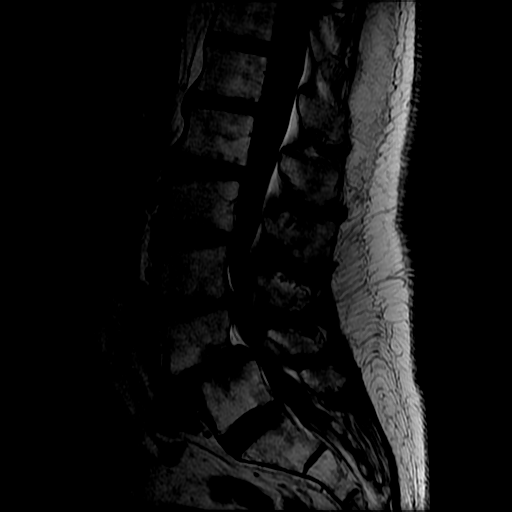
[im 14/14]
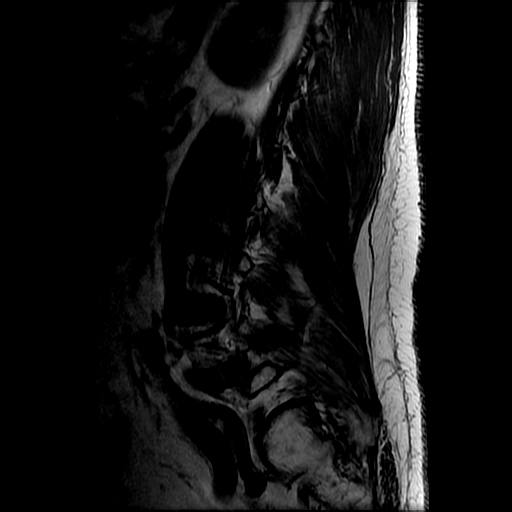

[Series 5: T2 · axial · 4.0mm · 0.41mm/px · z∈[+44,+242]mm · 10 of 39 slices shown]
[im 1/39]
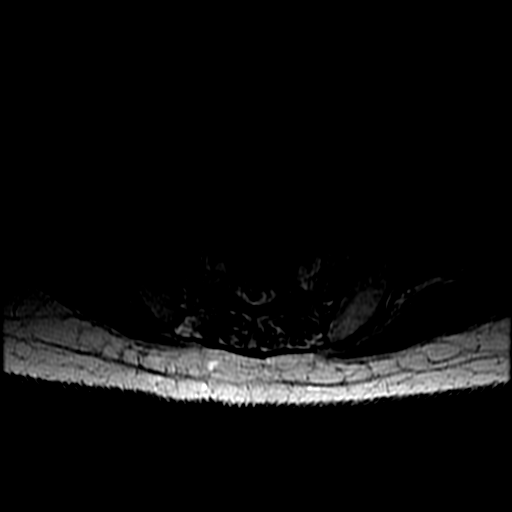
[im 4/39]
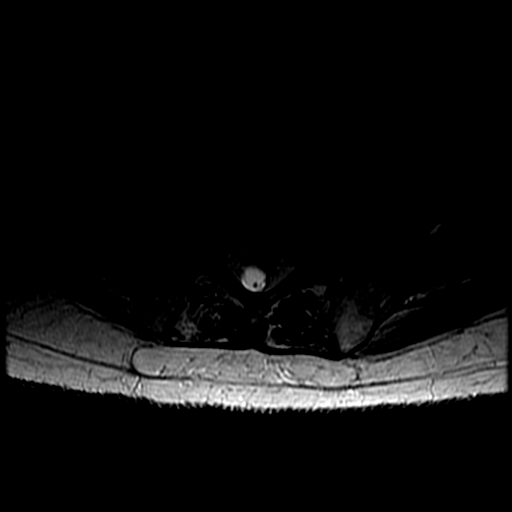
[im 8/39]
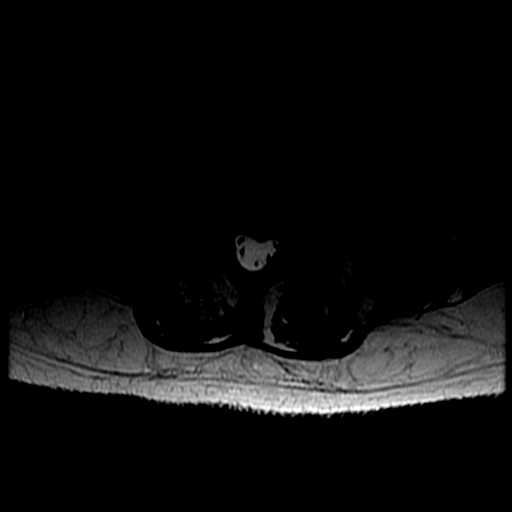
[im 12/39]
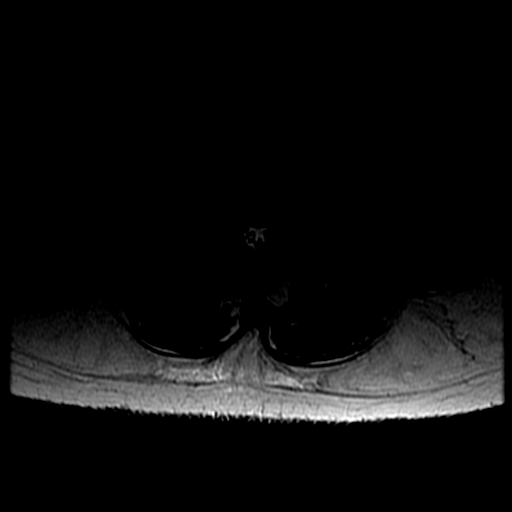
[im 16/39]
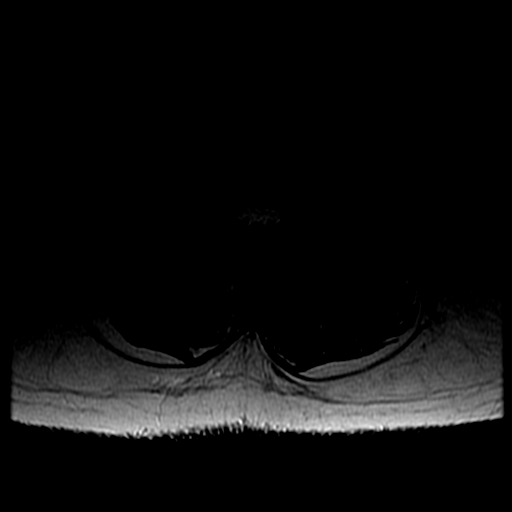
[im 20/39]
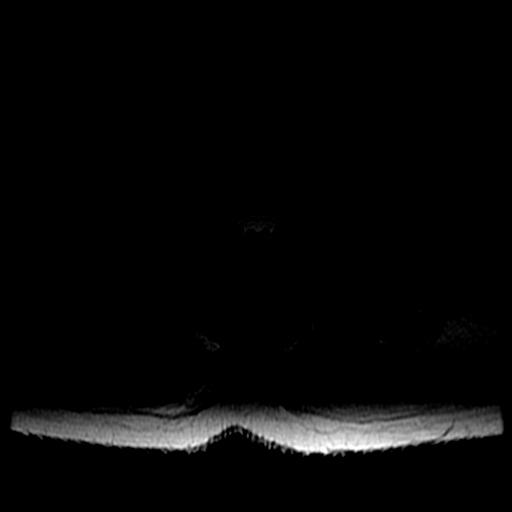
[im 23/39]
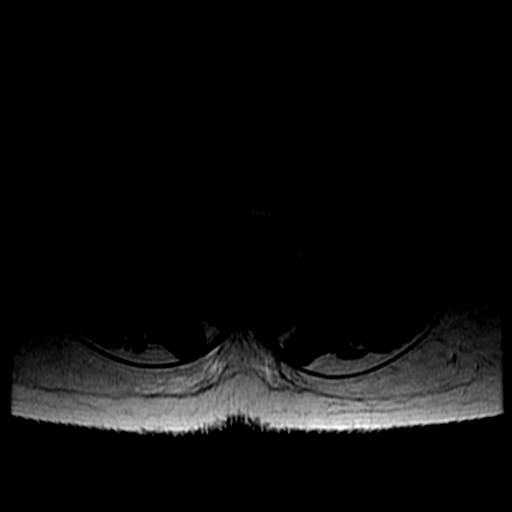
[im 27/39]
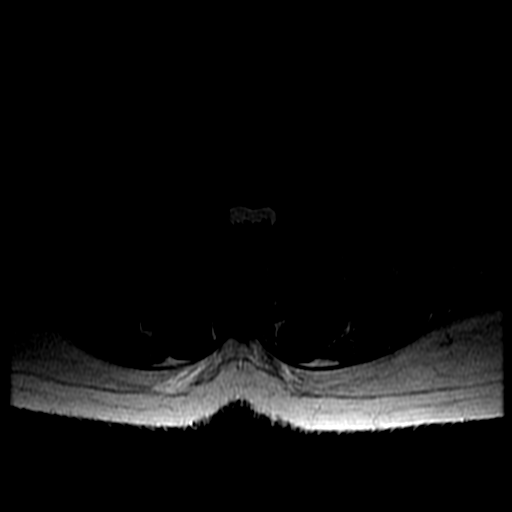
[im 31/39]
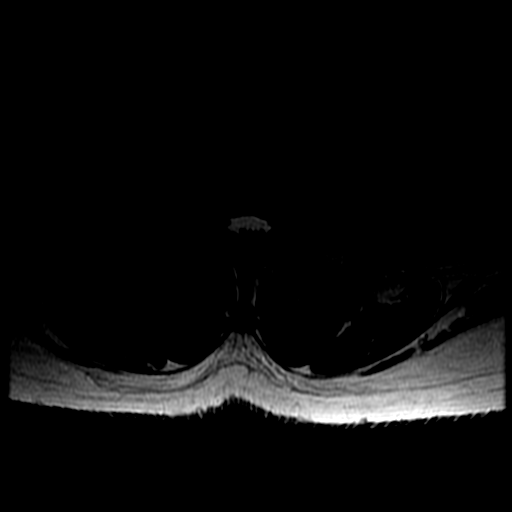
[im 35/39]
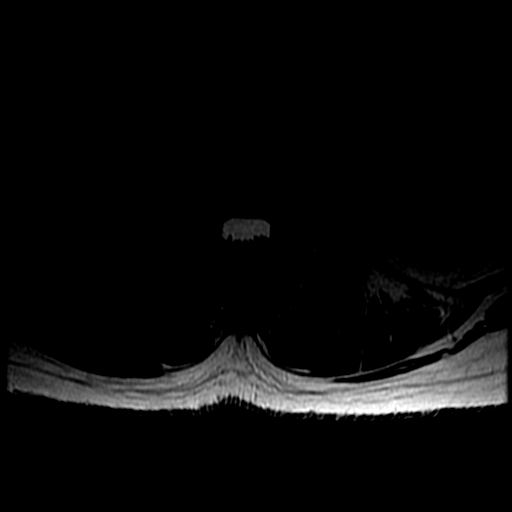

[Series 6: T1 · axial · 4.0mm · 0.41mm/px · z∈[+59,+242]mm · 3 of 39 slices shown (2 of 2)]
[im 4/39]
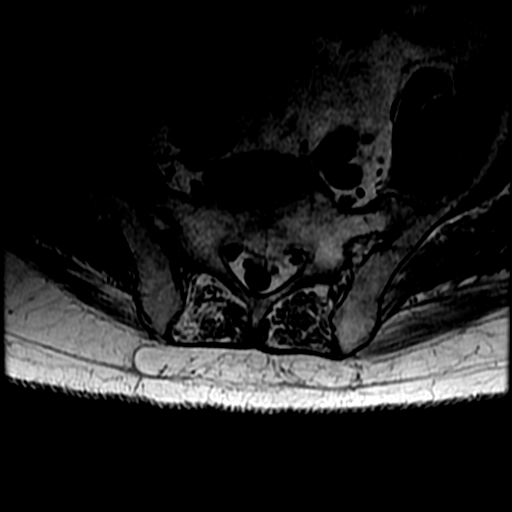
[im 20/39]
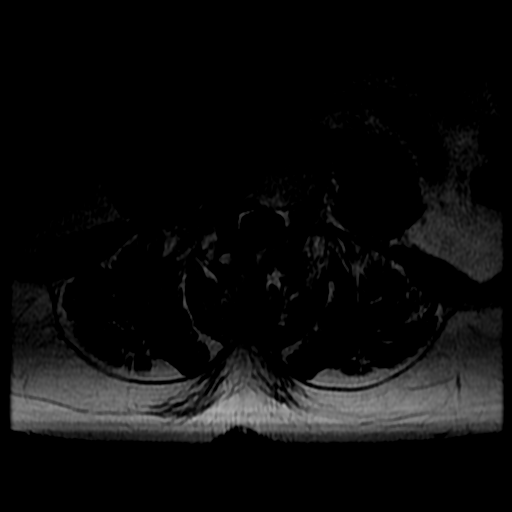
[im 35/39]
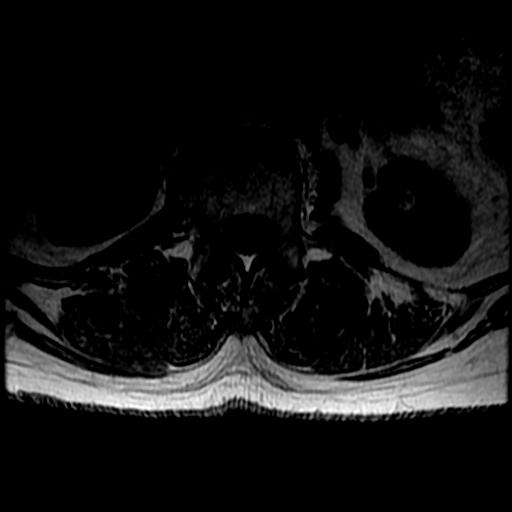

[Series 7: T2 post-contrast · sagittal · 4.0mm · 0.53mm/px · 3 of 14 slices shown]
[im 1/14]
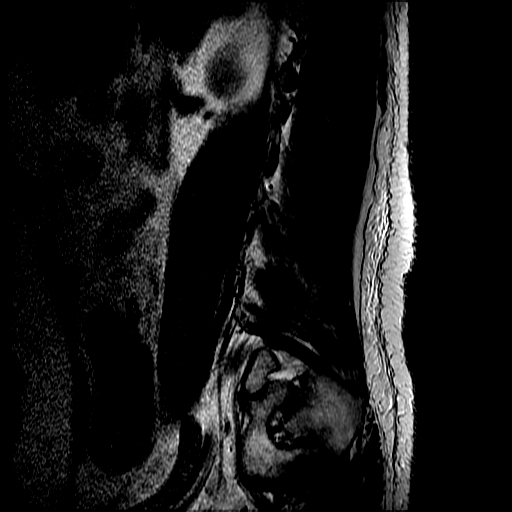
[im 9/14]
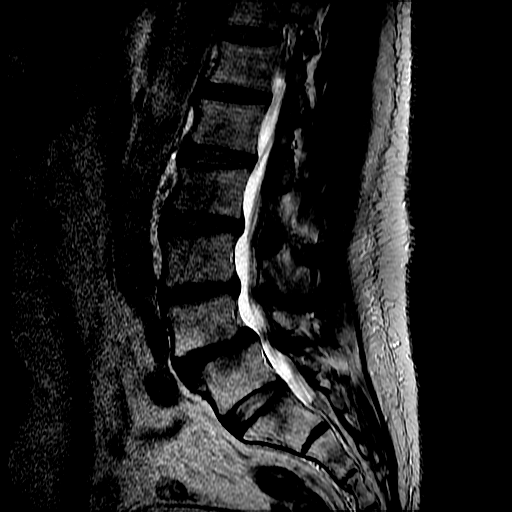
[im 14/14]
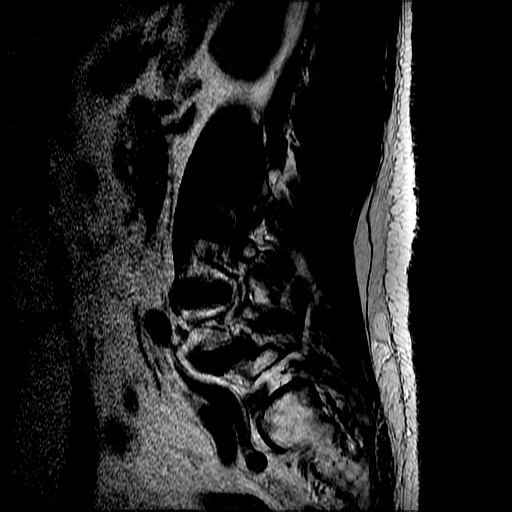

[19 of 48 positions shown; findings below may reference images not displayed]

FINDINGS: Segmentation:  Normal

Alignment: Mild retrolisthesis L2-3. Mild anterolisthesis L3-4. 6 mm
anterolisthesis L4-5

Vertebrae:  Negative for fracture or metastatic disease.

Conus medullaris and cauda equina: Conus extends to the T12-L1
level. Conus and cauda equina appear normal.

Paraspinal and other soft tissues: Negative for paraspinous mass or
adenopathy.

Disc levels:

T12-L1: Mild disc degeneration.  Negative for stenosis.

L1-2: Mild disc and mild facet degeneration.  Negative for stenosis

L2-3: Mild retrolisthesis. Disc bulging and facet degeneration. Mild
spinal stenosis and moderate subarticular stenosis bilaterally

L3-4: Mild retrolisthesis. Disc bulging and facet degeneration. Mild
spinal stenosis and moderate subarticular stenosis bilaterally

L4-5: 6 mm anterolisthesis with advanced facet degeneration. Mild
spinal stenosis. Severe subarticular stenosis bilaterally with
impingement of the L4 and L5 nerve roots

L5-S1: Negative
IMPRESSION: Mild spinal stenosis and moderate subarticular stenosis bilaterally
L2-3 and L3-4

6 mm anterolisthesis L4-5 with severe subarticular stenosis
bilaterally and mild spinal stenosis.

Negative for metastatic disease.

## 2019-01-26 MED ORDER — GADOBUTROL 1 MMOL/ML IV SOLN
9.0000 mL | Freq: Once | INTRAVENOUS | Status: AC | PRN
  Administered 2019-01-26: 9 mL via INTRAVENOUS

## 2019-01-26 NOTE — Patient Instructions (Signed)
Magnetic Resonance Imaging  Magnetic resonance imaging (MRI) is a painless test that produces images of the inside of the body without using X-rays. During an MRI, strong magnets and radio waves work together in a magnetic field to form detailed images. MRI images may provide more details about a medical condition than X-rays, CT scans, and ultrasounds can provide.  For a standard MRI, you will lie on a platform that slides into a tunnel. The tunnel contains magnets that scan your body. If you have an open MRI, the tunnel will be open at the sides. In some cases, dye (contrast material or contrast dye) may be injected into your bloodstream to make the MRI images even clearer.  Tell a health care provider about:   Any surgeries you have had.   Any medical conditions you have.   Any metal you may have in your body. The magnet used in MRI can cause metal objects in your body to move. Metal can also make it hard to get high-quality images. Objects that may contain metal include:  ? Any joint replacement (prosthesis), such as an artificial knee or hip.  ? An implanted defibrillator, pacemaker, or neurostimulator.  ? A metallic ear implant (cochlear implant).  ? An artificial heart valve.  ? A metallic object in the eye.  ? Metal splinters.  ? Bullet fragments.  ? A port for delivering insulin or chemotherapy.   Any tattoos. Some of the darker inks can cause problems with testing.   Whether you are pregnant, may be pregnant, or are breastfeeding.   Any fear of cramped spaces (claustrophobia). If this is a problem, it usually can be managed with medicines given prior to the MRI.   Any allergies you have.   All medicines you are taking, including vitamins, herbs, eye drops, creams, and over-the-counter medicines.  What are the risks?  Generally, this is a safe test. However, problems can occur:   If you have metal in your body, it may be affected by the magnet used during the test. If you have a metallic implant  close to the area being tested, it may be hard to get high-quality images.   If you are pregnant, you should avoid MRI tests during the first three months of pregnancy. MRI may have effects on an unborn baby.   If you are breastfeeding and contrast material will be used during your test, you may need to stop breastfeeding temporarily. Your breast milk may contain contrast material until the material naturally leaves your body.  What happens before the procedure?   You will be asked to remove all metal, including:  ? Your watch, jewelry, and other metal objects.  ? Hearing aids.  ? Dentures.  ? Underwire bra.  ? Makeup. Certain kinds of makeup contain small amounts of metal.  ? Braces and fillings normally are not a problem.   If you are breastfeeding, ask your health care provider if you need to pump before your test and stop breastfeeding temporarily. You may need to do this if contrast material will be used.  What happens during the procedure?     You may be given earplugs or headphones to listen to music. The MRI machine can be noisy.   You will lie down on a platform, similar to a long table.   If a contrast material will be used, an IV will be inserted into one of your veins. Contrast material will be injected into your IV at a certain time as   images are taken.   The platform will slide into a tunnel that has magnets inside of it. When you are inside the tunnel, you will still be able to talk to your health care provider.   You will be asked to lie very still while images are taken. Your health care provider will tell you when you can move. You may have to wait a few minutes to make sure that the images produced during the test are readable.   When all images are produced, the platform will slide out of the tunnel.  The procedure may vary among health care providers and hospitals.  What happens after the procedure?   You may be taken to a recovery area if sedation medicines were used. Your blood  pressure, heart rate, breathing rate, and blood oxygen level will be monitored until you leave the hospital or clinic.   If contrast material was used:  ? It will leave your body through your urine within a day. You may be told to drink plenty of fluids to help flush the contrast material out of your system.  ? If you are breastfeeding, do not breastfeed your child until your health care provider says that this is safe.   You may return to your normal activities right away, or as told by your health care provider.   It is up to you to get your test results. Ask your health care provider, or the department that is doing the test, when your results will be ready.  Summary   Magnetic resonance imaging (MRI) is a painless test that produces detailed pictures of the inside of your body without using X-rays. Instead, strong magnets and radio waves work together in a magnetic field to form very detailed and sharp images.   Contrast material, also called contrast dye, may be injected into your body to make MRI images even clearer.   Before your MRI, be sure to tell your health care provider about any metal you may have in your body.   Talk with your health care provider about what your test results mean.  This information is not intended to replace advice given to you by your health care provider. Make sure you discuss any questions you have with your health care provider.  Document Released: 08/24/2000 Document Revised: 07/22/2017 Document Reviewed: 07/22/2017  Elsevier Interactive Patient Education  2019 [employer]

## 2019-01-26 NOTE — Progress Notes (Signed)
Pt reports increasing weakness in legs & fatigue & dizziness that has caused multiple recent falls.  Reports hitting his head several times but denies HA, blurred vision, or neurological changes or injury.  A&Ox4.  No facial droop noted.  Pt denies tachycardia or SOB/DOE.  Reports feeling "like I'm walking on a cushion, like my legs don't want to work".  Denies loss of bowel control but reports constipation.

## 2019-01-26 NOTE — Progress Notes (Signed)
[facility] Progress Note   Dennis Fischer [id number] Jul 13, 1946 73 y.o.  Dennis Fischer is managed by Dr. Alen Blew  Actively treated with chemotherapy/immunotherapy/hormonal therapy: yes  Current therapy: Lupron and Abiraterone  Next scheduled appointment with provider: 03/10/2019  Assessment: Plan:    Recurrent falls - Plan: MR Lumbar Spine W Wo Contrast  Neuropathy - Plan: MR Lumbar Spine W Wo Contrast  Weakness of right lower extremity - Plan: MR Lumbar Spine W Wo Contrast  Prostate cancer metastatic to bone [facility]) - Plan: MR Lumbar Spine W Wo Contrast   Recurrent falls, neuropathy, and weakness of the right lower extremity.  The patient was referred for an MRI of his thoracic spine today which returned showing:  Mild spinal stenosis and moderate subarticular stenosis bilaterally L2-3 and L3-4  6 mm anterolisthesis L4-5 with severe subarticular stenosis bilaterally and mild spinal stenosis.  Negative for metastatic disease.  I discussed with the patient that I would review these results with Dr. Alen Blew and would obtain his recommendations as to what should take place next.   Metastatic prostate cancer with bony metastatic disease: Patient continues to be followed by Dr. Alen Blew and is currently treated with Lupron and Abiraterone.  He is scheduled to be seen in follow-up on 03/10/2019.  Please see After Visit Summary for patient specific instructions.  Future Appointments  Date Time Provider [place]  02/17/2019 12:00 PM [facility] None  02/24/2019 12:00 PM [facility] 1 [facility] [place]  03/10/2019 11:45 AM [facility] LAB 1 [facility] None  03/10/2019 12:00 PM [facility] [place] [facility] None  03/10/2019 12:15 PM Shadad, Mathis Dad, MD [facility] None  03/10/2019  1:00 PM [place] [facility] None    Orders Placed This Encounter  Procedures  . MR Lumbar Spine W Wo Contrast       Subjective:   Patient ID:  Dennis Fischer is a 73 y.o. (DOB 08-10-1946) male.  Chief Complaint:  Chief Complaint  Patient presents with  . Peripheral Neuropathy    HPI Dennis Fischer   is a 73 year old male with a history of a metastatic prostate cancer with bony metastatic disease to is followed by Dr. Alen Blew and is currently treated with Lupron and Abiraterone.  He presents to the office today with a report that he has had increasing lower extremity weakness over the past 3 to 4 weeks with numbness in his feet bilaterally.  He reports that he feels as if he is walking on cushions.  He fell several times over the weekend and reports having difficulty getting out of the bathtub.  He reports generalized weakness, fatigue, and difficulty walking.  He hit his head several times when he fell but did not lose consciousness.  He has had no headache and no blurring vision.  Medications: I have reviewed the patient's current medications.  Allergies:  Allergies  Allergen Reactions  . Lisinopril Swelling    Facial swelling  . Cat Hair Extract     eyes swell  . Mangifera Indica     MANGO --  . Mango Flavor     throat swells with mango  . Other   . Peanut-Containing Drug Products   . Pistachio Nut (Diagnostic)     hands and feet burn/itch  . Sunflower Oil     Sunflower seed allergy    Past Medical History:  Diagnosis Date  . Hypertension   . Metastatic cancer to bone ([place]) dx'd 2018  . Prostate cancer ([place])  Past Surgical History:  Procedure Laterality Date  . HIP SURGERY Bilateral   . IR IMAGING GUIDED PORT INSERTION  04/22/2018    Family History  Problem Relation Age of Onset  . Prostate cancer Father     Social History   Socioeconomic History  . Marital status: Significant Other    Spouse name: [name]  . Number of children: 3  . Years of education: Not on file  . Highest education level: Not on file  Occupational History  . Not on file  Social Needs  . Financial resource strain: Not on file  .  Food insecurity:    Worry: Not on file    Inability: Not on file  . Transportation needs:    Medical: Not on file    Non-medical: Not on file  Tobacco Use  . Smoking status: Former Smoker    Packs/day: 0.25    Years: 50.00    Pack years: 12.50    Types: Cigarettes    Last attempt to quit: 09/10/2014    Years since quitting: 4.3  . Smokeless tobacco: Never Used  . Tobacco comment: Reports smoking only when he drank.  Substance and Sexual Activity  . Alcohol use: Yes    Comment: Once every 2 weeks.   . Drug use: Yes    Types: Marijuana    Comment: Medical   . Sexual activity: Not Currently  Lifestyle  . Physical activity:    Days per week: Not on file    Minutes per session: Not on file  . Stress: Not on file  Relationships  . Social connections:    Talks on phone: Not on file    Gets together: Not on file    Attends religious service: Not on file    Active member of club or organization: Not on file    Attends meetings of clubs or organizations: Not on file    Relationship status: Not on file  . Intimate partner violence:    Fear of current or ex partner: Not on file    Emotionally abused: Not on file    Physically abused: Not on file    Forced sexual activity: Not on file  Other Topics Concern  . Not on file  Social History Narrative  . Not on file    Past Medical History, Surgical history, Social history, and Family history were reviewed and updated as appropriate.   Please see review of systems for further details on the patient's review from today.   Review of Systems:  Review of Systems  Constitutional: Positive for fatigue. Negative for chills, diaphoresis and fever.  HENT: Negative for trouble swallowing and voice change.   Respiratory: Negative for cough, chest tightness, shortness of breath and wheezing.   Cardiovascular: Negative for chest pain and palpitations.  Gastrointestinal: Negative for abdominal pain, constipation, diarrhea, nausea and vomiting.   Musculoskeletal: Positive for gait problem. Negative for back pain and myalgias.  Neurological: Positive for weakness. Negative for dizziness, light-headedness and headaches.       Multiple recent falls    Objective:   Physical Exam:  BP 127/82 (BP Location: Right Arm, Patient Position: Sitting)   Pulse 96   Temp 98.9 F (37.2 C) (Oral)   Resp 18   Ht 5\' 9"  (1.753 m)   Wt 187 lb 6.4 oz (85 kg)   SpO2 100%   BMI 27.67 kg/m  ECOG: 1  Physical Exam Constitutional:      General: He is not in  acute distress.    Appearance: He is not diaphoretic.  HENT:     Head: Normocephalic and atraumatic.  Eyes:     General: No scleral icterus.       Right eye: No discharge.        Left eye: No discharge.     Conjunctiva/sclera: Conjunctivae normal.  Cardiovascular:     Rate and Rhythm: Normal rate and regular rhythm.     Heart sounds: Normal heart sounds. No murmur. No friction rub. No gallop.   Pulmonary:     Effort: Pulmonary effort is normal. No respiratory distress.     Breath sounds: Normal breath sounds. No wheezing or rales.  Abdominal:     General: Bowel sounds are normal. There is no distension.     Tenderness: There is no abdominal tenderness. There is no guarding.  Skin:    General: Skin is warm and dry.     Findings: No erythema or rash.  Neurological:     Mental Status: He is alert.     Motor: Weakness present.     Gait: Gait abnormal.     Comments: The patient appears weak and has an unstable gait.  He is having difficulty getting up out of of his exam room chair to get into the wheelchair.   Left lower extremity strength 5/5 Right lower extremity strength 4/5      Lab Review:     Component Value Date/Time   NA 141 01/13/2019 1057   NA 141 08/08/2017 1116   K 4.0 01/13/2019 1057   K 3.6 08/08/2017 1116   CL 108 01/13/2019 1057   CO2 23 01/13/2019 1057   CO2 24 08/08/2017 1116   GLUCOSE 111 (H) 01/13/2019 1057   GLUCOSE 100 08/08/2017 1116   BUN 17  01/13/2019 1057   BUN 13.2 08/08/2017 1116   CREATININE 1.11 01/13/2019 1057   CREATININE 1.1 08/08/2017 1116   CALCIUM 9.0 01/13/2019 1057   CALCIUM 9.1 08/08/2017 1116   PROT 7.6 01/13/2019 1057   PROT 7.5 08/08/2017 1116   ALBUMIN 3.5 01/13/2019 1057   ALBUMIN 3.9 08/08/2017 1116   AST 14 (L) 01/13/2019 1057   AST 17 08/08/2017 1116   ALT 14 01/13/2019 1057   ALT 17 08/08/2017 1116   ALKPHOS 116 01/13/2019 1057   ALKPHOS 214 (H) 08/08/2017 1116   BILITOT <0.2 (L) 01/13/2019 1057   BILITOT 0.29 08/08/2017 1116   GFRNONAA >60 01/13/2019 1057   GFRAA >60 01/13/2019 1057       Component Value Date/Time   WBC 4.8 01/26/2019 1144   WBC 6.4 04/22/2018 1259   RBC 4.61 01/26/2019 1144   HGB 12.6 (L) 01/26/2019 1144   HGB 12.9 (L) 08/08/2017 1117   HCT 38.9 (L) 01/26/2019 1144   HCT 38.7 08/08/2017 1117   PLT 261 01/26/2019 1144   PLT 243 08/08/2017 1117   MCV 84.4 01/26/2019 1144   MCV 89.4 08/08/2017 1117   MCH 27.3 01/26/2019 1144   MCHC 32.4 01/26/2019 1144   RDW 19.9 (H) 01/26/2019 1144   RDW 14.1 08/08/2017 1117   LYMPHSABS 1.1 01/26/2019 1144   LYMPHSABS 2.0 08/08/2017 1117   MONOABS 0.3 01/26/2019 1144   MONOABS 0.3 08/08/2017 1117   EOSABS 0.1 01/26/2019 1144   EOSABS 0.3 08/08/2017 1117   BASOSABS 0.0 01/26/2019 1144   BASOSABS 0.0 08/08/2017 1117   -------------------------------  Imaging from last 24 hours (if applicable):  Radiology interpretation: Mr Lumbar Spine W Wo Contrast  Result Date: 01/26/2019 CLINICAL DATA:  Recurrent falls. Neuropathy. Right leg weakness. Prostate cancer EXAM: MRI LUMBAR SPINE WITHOUT AND WITH CONTRAST TECHNIQUE: Multiplanar and multiecho pulse sequences of the lumbar spine were obtained without and with intravenous contrast. CONTRAST:  9 mL Gadovist IV COMPARISON:  Whole body bone scan 11/17/2018 FINDINGS: Segmentation:  Normal Alignment: Mild retrolisthesis L2-3. Mild anterolisthesis L3-4. 6 mm anterolisthesis L4-5 Vertebrae:   Negative for fracture or metastatic disease. Conus medullaris and cauda equina: Conus extends to the T12-L1 level. Conus and cauda equina appear normal. Paraspinal and other soft tissues: Negative for paraspinous mass or adenopathy. Disc levels: T12-L1: Mild disc degeneration.  Negative for stenosis. L1-2: Mild disc and mild facet degeneration.  Negative for stenosis L2-3: Mild retrolisthesis. Disc bulging and facet degeneration. Mild spinal stenosis and moderate subarticular stenosis bilaterally L3-4: Mild retrolisthesis. Disc bulging and facet degeneration. Mild spinal stenosis and moderate subarticular stenosis bilaterally L4-5: 6 mm anterolisthesis with advanced facet degeneration. Mild spinal stenosis. Severe subarticular stenosis bilaterally with impingement of the L4 and L5 nerve roots L5-S1: Negative IMPRESSION: Mild spinal stenosis and moderate subarticular stenosis bilaterally L2-3 and L3-4 6 mm anterolisthesis L4-5 with severe subarticular stenosis bilaterally and mild spinal stenosis. Negative for metastatic disease. Electronically Signed   By: Franchot Gallo M.D.   On: 01/26/2019 15:23   Nm Xofigo Injection  Result Date: 01/20/2019  Trudi Ida was injected intravenously in Nuclear Medicine under the supervision of the attending radiologist

## 2019-01-26 NOTE — Progress Notes (Unsigned)
correc

## 2019-01-27 ENCOUNTER — Other Ambulatory Visit: Payer: Self-pay | Admitting: Medical

## 2019-01-27 DIAGNOSIS — G629 Polyneuropathy, unspecified: Secondary | ICD-10-CM

## 2019-01-27 DIAGNOSIS — M48061 Spinal stenosis, lumbar region without neurogenic claudication: Secondary | ICD-10-CM

## 2019-01-27 DIAGNOSIS — R29898 Other symptoms and signs involving the musculoskeletal system: Secondary | ICD-10-CM

## 2019-01-27 MED ORDER — GABAPENTIN 300 MG PO CAPS: 300.0000 mg | ORAL_CAPSULE | Freq: Every day | ORAL | 5 refills | Status: DC

## 2019-01-29 ENCOUNTER — Telehealth: Payer: Self-pay | Admitting: *Deleted

## 2019-01-29 NOTE — Telephone Encounter (Signed)
[place] Work  Clinical Social Work received phone call from patient and caregiver- he has difficulty walking due to neuropathy and has fallen at home.  Patient requesting home aide to assist.  CSW explained this service is not covered by Medicare.  Patient has VA benefits, plans to call his [place] provider to request home care/equipment.  Gwinda Maine, LCSW  Clinical Social Worker [facility]

## 2019-02-03 ENCOUNTER — Ambulatory Visit: Payer: Medicare Other | Admitting: Orthopaedic Surgery

## 2019-02-05 ENCOUNTER — Telehealth: Payer: Self-pay | Admitting: *Deleted

## 2019-02-05 NOTE — Telephone Encounter (Signed)
[place] Work  Clinical Social Work contacted [place] regarding patient's request for durable medical equipment and personal care services. CSW spoke with [place]- she reported this order has already been made.  CSW notified patient and encouraged him to call [place] with questions or updates.  Gwinda Maine, LCSW  Clinical Social Worker [facility]

## 2019-02-06 ENCOUNTER — Ambulatory Visit: Payer: Medicare Other | Admitting: Orthopaedic Surgery

## 2019-02-16 ENCOUNTER — Telehealth: Payer: Self-pay | Admitting: *Deleted

## 2019-02-16 ENCOUNTER — Other Ambulatory Visit: Payer: Self-pay | Admitting: Radiation Oncology

## 2019-02-16 DIAGNOSIS — C7951 Secondary malignant neoplasm of bone: Secondary | ICD-10-CM

## 2019-02-16 DIAGNOSIS — C7952 Secondary malignant neoplasm of bone marrow: Secondary | ICD-10-CM

## 2019-02-16 NOTE — Telephone Encounter (Signed)
CALLED PATIENT TO REMIND OF LAB AND WEIGHT APPT. FOR 02-17-19- ARRIVAL TIME- 11:45 AM @ [place], SPOKE WITH PATIENT AND HE IS AWARE OF THIS APPT.

## 2019-02-17 ENCOUNTER — Ambulatory Visit
Admission: RE | Admit: 2019-02-17 | Discharge: 2019-02-17 | Disposition: A | Discharge status: Home / Self Care | Payer: Medicare Other | Source: Ambulatory Visit | Attending: Radiation Oncology | Admitting: Radiation Oncology

## 2019-02-17 ENCOUNTER — Encounter: Payer: Self-pay | Admitting: Orthopaedic Surgery

## 2019-02-17 ENCOUNTER — Ambulatory Visit (INDEPENDENT_AMBULATORY_CARE_PROVIDER_SITE_OTHER): Payer: Medicare Other | Admitting: Orthopaedic Surgery

## 2019-02-17 ENCOUNTER — Other Ambulatory Visit: Payer: Self-pay

## 2019-02-17 VITALS — Ht 72.0 in | Wt 187.0 lb

## 2019-02-17 DIAGNOSIS — Z91010 Allergy to peanuts: Secondary | ICD-10-CM | POA: Diagnosis not present

## 2019-02-17 DIAGNOSIS — C7951 Secondary malignant neoplasm of bone: Secondary | ICD-10-CM | POA: Insufficient documentation

## 2019-02-17 DIAGNOSIS — C61 Malignant neoplasm of prostate: Secondary | ICD-10-CM | POA: Diagnosis not present

## 2019-02-17 DIAGNOSIS — I10 Essential (primary) hypertension: Secondary | ICD-10-CM | POA: Diagnosis not present

## 2019-02-17 DIAGNOSIS — G629 Polyneuropathy, unspecified: Secondary | ICD-10-CM

## 2019-02-17 DIAGNOSIS — Z7982 Long term (current) use of aspirin: Secondary | ICD-10-CM | POA: Insufficient documentation

## 2019-02-17 DIAGNOSIS — Z8042 Family history of malignant neoplasm of prostate: Secondary | ICD-10-CM | POA: Insufficient documentation

## 2019-02-17 DIAGNOSIS — C7952 Secondary malignant neoplasm of bone marrow: Secondary | ICD-10-CM | POA: Insufficient documentation

## 2019-02-17 DIAGNOSIS — R29898 Other symptoms and signs involving the musculoskeletal system: Secondary | ICD-10-CM | POA: Diagnosis not present

## 2019-02-17 DIAGNOSIS — Z888 Allergy status to other drugs, medicaments and biological substances status: Secondary | ICD-10-CM | POA: Diagnosis not present

## 2019-02-17 DIAGNOSIS — Z79899 Other long term (current) drug therapy: Secondary | ICD-10-CM | POA: Insufficient documentation

## 2019-02-17 DIAGNOSIS — Z87891 Personal history of nicotine dependence: Secondary | ICD-10-CM | POA: Diagnosis not present

## 2019-02-17 DIAGNOSIS — Z7952 Long term (current) use of systemic steroids: Secondary | ICD-10-CM | POA: Diagnosis not present

## 2019-02-17 LAB — CBC WITH DIFFERENTIAL (CANCER CENTER ONLY)
Abs Immature Granulocytes: 0.01 10*3/uL (ref 0.00–0.07)
Basophils Absolute: 0 10*3/uL (ref 0.0–0.1)
Basophils Relative: 1 %
Eosinophils Absolute: 0.1 10*3/uL (ref 0.0–0.5)
Eosinophils Relative: 2 %
HCT: 36.9 % — ABNORMAL LOW (ref 39.0–52.0)
Hemoglobin: 12 g/dL — ABNORMAL LOW (ref 13.0–17.0)
Immature Granulocytes: 0 %
Lymphocytes Relative: 30 %
Lymphs Abs: 1.2 10*3/uL (ref 0.7–4.0)
MCH: 28 pg (ref 26.0–34.0)
MCHC: 32.5 g/dL (ref 30.0–36.0)
MCV: 86 fL (ref 80.0–100.0)
Monocytes Absolute: 0.3 10*3/uL (ref 0.1–1.0)
Monocytes Relative: 7 %
Neutro Abs: 2.5 10*3/uL (ref 1.7–7.7)
Neutrophils Relative %: 60 %
Platelet Count: 245 10*3/uL (ref 150–400)
RBC: 4.29 MIL/uL (ref 4.22–5.81)
RDW: 20.4 % — ABNORMAL HIGH (ref 11.5–15.5)
WBC Count: 4.1 10*3/uL (ref 4.0–10.5)
nRBC: 0 % (ref 0.0–0.2)

## 2019-02-17 NOTE — Progress Notes (Signed)
Office Visit Note   Patient: Dennis Fischer           Date of Birth: 1945-12-11           MRN: [id number] Visit Date: 02/17/2019              Requested by: [name], MD [address] [place], [place] [address] PCP: [name], MD   Assessment & Plan: Visit Diagnoses:  1. Neuropathy   2. Bilateral leg weakness     Plan: Patient has peripheral neuropathy without symptoms of neurogenic claudication.  He does have some spondylolisthesis at L4-5 but no severe spinal stenosis present.  Patient has good cervical range of motion.  With previous bone scan showing metastatic disease in the thoracic spine I recommend thoracic MRI as well as a cervical MRI without contrast to rule out compression since his legs are so weak he cannot stand and cannot walk.  Follow-Up Instructions: Return in about 6 weeks (around 03/31/2019).   Orders:  Orders Placed This Encounter  Procedures  . MR Cervical Spine w/o contrast  . MR Thoracic Spine w/o contrast  . Ambulatory referral to Physical Therapy   No orders of the defined types were placed in this encounter.     Procedures: No procedures performed   Clinical Data: No additional findings.   Subjective: Chief Complaint  Patient presents with  . Right Leg - Weakness  . Left Leg - Weakness    HPI 72 year old male here with problems with severe neuropathy and history of prostate cancer undergoing chemotherapy.  He has bilateral lower extremity numbness and has had progressive weakness in his legs to the point where now he cannot stand and cannot walk.  He originally used a cane but 2 months ago and then had to use a walker after that and now is gotten to the point where he is barely able to walk even with a walker.  He denies associated weakness with his arms.  Patient has prostate cancer which is metastatic with mets to scapula also increased uptake right fifth and seventh ribs.  Thoracic spine mets.  Patient's had  chemotherapy/immunotherapy/hormonal therapy.  Lupron and abiraterone.  Review of Systems positive for metastatic prostate cancer history of right knee pain.  Port-A-Cath placement GERD, hypertension, Prediabetes, high cholesterol, smoking history, peripheral neuropathy related to chemotherapy.previous right left total hip arthroplasties done at [facility].  Objective: Vital Signs: Ht 6' (1.829 m)   Wt 187 lb (84.8 kg)   BMI 25.36 kg/m   Physical Exam Constitutional:      Appearance: He is well-developed.  HENT:     Head: Normocephalic and atraumatic.  Eyes:     Pupils: Pupils are equal, round, and reactive to light.  Neck:     Thyroid: No thyromegaly.     Trachea: No tracheal deviation.  Cardiovascular:     Rate and Rhythm: Normal rate.  Pulmonary:     Effort: Pulmonary effort is normal.     Breath sounds: No wheezing.  Abdominal:     General: Bowel sounds are normal.     Palpations: Abdomen is soft.  Skin:    General: Skin is warm and dry.     Capillary Refill: Capillary refill takes less than 2 seconds.  Neurological:     Mental Status: He is alert and oriented to person, place, and time.  Psychiatric:        Behavior: Behavior normal.        Thought Content:  Thought content normal.        Judgment: Judgment normal.     Ortho Exam patient is in a wheelchair.  He has some weakness of anterior tib gastrocsoleus peroneals and posterior tib which is symmetrical.  He can extend his knees but of weakness of quads as well as hamstrings.  Upper extremity strength significantly better than lower extremities.  Specialty Comments:  No specialty comments available.  Imaging: No results found.   PMFS History: Patient Active Problem List   Diagnosis Date Noted  . Prostate cancer metastatic to bone ([place]) 12/22/2018  . Port-A-Cath in place 07/02/2018  . Goals of care, counseling/discussion 01/20/2018  . Right knee pain 01/17/2018  . Prostate cancer ([place]) 01/22/2017   . Prediabetes 03/30/2016  . Positive FIT (fecal immunochemical test) 11/22/2015  . GERD (gastroesophageal reflux disease) 12/21/2014  . Age-related nuclear cataract of both eyes 01/13/2013  . Hypertensive retinopathy 01/13/2013  . Essential (primary) hypertension 11/25/2012  . Pure hypercholesterolemia 11/25/2012  . History of total hip replacement 08/28/2010  . Tobacco dependence syndrome 03/22/2006   Past Medical History:  Diagnosis Date  . Hypertension   . Metastatic cancer to bone ([place]) dx'd 2018  . Prostate cancer [facility])     Family History  Problem Relation Age of Onset  . Prostate cancer Father     Past Surgical History:  Procedure Laterality Date  . HIP SURGERY Bilateral   . IR IMAGING GUIDED PORT INSERTION  04/22/2018   Social History   Occupational History  . Not on file  Tobacco Use  . Smoking status: Former Smoker    Packs/day: 0.25    Years: 50.00    Pack years: 12.50    Types: Cigarettes    Last attempt to quit: 09/10/2014    Years since quitting: 4.4  . Smokeless tobacco: Never Used  . Tobacco comment: Reports smoking only when he drank.  Substance and Sexual Activity  . Alcohol use: Yes    Comment: Once every 2 weeks.   . Drug use: Yes    Types: Marijuana    Comment: Medical   . Sexual activity: Not Currently

## 2019-02-22 ENCOUNTER — Other Ambulatory Visit: Payer: Self-pay | Admitting: Family Medicine

## 2019-02-22 DIAGNOSIS — C61 Malignant neoplasm of prostate: Secondary | ICD-10-CM

## 2019-02-23 ENCOUNTER — Telehealth: Payer: Self-pay | Admitting: *Deleted

## 2019-02-23 ENCOUNTER — Ambulatory Visit: Payer: Medicare Other | Admitting: Physical Therapy

## 2019-02-23 NOTE — Telephone Encounter (Signed)
CALLED PATIENT TO REMIND OF XOFIGO INJ. FOR 02-24-19 - ARRIVAL TIME - 11:45 AM @ [facility], SPOKE WITH PATIENT AND HE IS AWARE OF THIS INJ.

## 2019-02-24 ENCOUNTER — Telehealth: Payer: Self-pay | Admitting: *Deleted

## 2019-02-24 ENCOUNTER — Inpatient Hospital Stay (HOSPITAL_COMMUNITY): Admission: RE | Admit: 2019-02-24 | Payer: Medicare Other | Source: Ambulatory Visit

## 2019-02-24 NOTE — Telephone Encounter (Signed)
CALLED PATIENT TO INFORM THAT XOFIGO INJ. WILL BE ON 03-03-19 - ARRIVAL TIME- 11:45 AM @ [facility], SPOKE WITH PATIENT AND HE IS AWARE OF THIS INJ.

## 2019-02-24 NOTE — Telephone Encounter (Signed)
Called patient to inform that Nuc Med neglected to order dose, informed that patient doesn't need to come today, informed that we are talking about rescheduling for June 19, spoke with patient and he understood this

## 2019-02-26 ENCOUNTER — Ambulatory Visit (HOSPITAL_COMMUNITY): Payer: Medicare Other

## 2019-03-02 ENCOUNTER — Telehealth: Payer: Self-pay | Admitting: *Deleted

## 2019-03-02 NOTE — Telephone Encounter (Signed)
CALLED PATIENT TO REMIND OF XOFIGO INJ. FOR 03-03-19 - ARRIVAL TIME - 11:45 AM @ [facility], SPOKE WITH PATIENT AND HE IS AWARE OF THIS INJ. 

## 2019-03-03 ENCOUNTER — Other Ambulatory Visit: Payer: Self-pay

## 2019-03-03 ENCOUNTER — Ambulatory Visit (HOSPITAL_COMMUNITY)
Admission: RE | Admit: 2019-03-03 | Discharge: 2019-03-03 | Disposition: A | Discharge status: Home / Self Care | Payer: Medicare Other | Source: Ambulatory Visit | Attending: Radiation Oncology | Admitting: Radiation Oncology

## 2019-03-03 DIAGNOSIS — C61 Malignant neoplasm of prostate: Secondary | ICD-10-CM | POA: Insufficient documentation

## 2019-03-03 DIAGNOSIS — C7951 Secondary malignant neoplasm of bone: Secondary | ICD-10-CM | POA: Diagnosis not present

## 2019-03-03 MED ORDER — RADIUM RA 223 DICHLORIDE 30 MCCI/ML IV SOLN
121.5600 | Freq: Once | INTRAVENOUS | Status: AC | PRN
  Administered 2019-03-03: 121.56 via INTRAVENOUS

## 2019-03-04 ENCOUNTER — Telehealth: Payer: Self-pay | Admitting: *Deleted

## 2019-03-04 ENCOUNTER — Other Ambulatory Visit (HOSPITAL_COMMUNITY): Payer: Self-pay | Admitting: Radiation Oncology

## 2019-03-04 DIAGNOSIS — C61 Malignant neoplasm of prostate: Secondary | ICD-10-CM

## 2019-03-04 NOTE — Telephone Encounter (Signed)
CALLED PATIENT TO INFORM OF LAB AND WEIGHT ON 03-24-19 @ 12 PM @ [place] ON 03-31-19 - ARRIVAL TIME - 11:45 AM @ [facility], SPOKE WITH PATIENT AND HE IS AWARE OF THESE APPTS.

## 2019-03-05 ENCOUNTER — Ambulatory Visit: Payer: Medicare Other | Attending: Orthopaedic Surgery | Admitting: Physical Therapy

## 2019-03-09 ENCOUNTER — Inpatient Hospital Stay (HOSPITAL_COMMUNITY)
Admission: EM | Admit: 2019-03-09 | Discharge: 2019-03-11 | DRG: 478 | Disposition: A | Discharge status: Rehabilitation Facility | Payer: Medicare Other | Attending: Family Medicine | Admitting: Family Medicine

## 2019-03-09 ENCOUNTER — Emergency Department (HOSPITAL_COMMUNITY): Payer: Medicare Other | Admitting: Anesthesiology

## 2019-03-09 ENCOUNTER — Other Ambulatory Visit: Payer: Self-pay | Admitting: Lab

## 2019-03-09 ENCOUNTER — Ambulatory Visit
Admission: RE | Admit: 2019-03-09 | Discharge: 2019-03-09 | Disposition: A | Discharge status: Home / Self Care | Payer: Medicare Other | Source: Ambulatory Visit | Attending: Orthopaedic Surgery | Admitting: Orthopaedic Surgery

## 2019-03-09 ENCOUNTER — Telehealth: Payer: Self-pay | Admitting: *Deleted

## 2019-03-09 ENCOUNTER — Telehealth: Payer: Self-pay | Admitting: Orthopaedic Surgery

## 2019-03-09 ENCOUNTER — Telehealth: Payer: Self-pay | Admitting: Radiology

## 2019-03-09 ENCOUNTER — Inpatient Hospital Stay (HOSPITAL_COMMUNITY): Payer: Medicare Other

## 2019-03-09 ENCOUNTER — Other Ambulatory Visit: Payer: Self-pay

## 2019-03-09 ENCOUNTER — Encounter (HOSPITAL_COMMUNITY)
Admission: EM | Disposition: A | Discharge status: Rehabilitation Facility | Payer: Self-pay | Source: Home / Self Care | Attending: Family Medicine

## 2019-03-09 ENCOUNTER — Encounter (HOSPITAL_COMMUNITY): Payer: Self-pay | Admitting: Emergency Medicine

## 2019-03-09 DIAGNOSIS — Z419 Encounter for procedure for purposes other than remedying health state, unspecified: Secondary | ICD-10-CM

## 2019-03-09 DIAGNOSIS — G992 Myelopathy in diseases classified elsewhere: Secondary | ICD-10-CM | POA: Diagnosis not present

## 2019-03-09 DIAGNOSIS — Z87891 Personal history of nicotine dependence: Secondary | ICD-10-CM

## 2019-03-09 DIAGNOSIS — R531 Weakness: Secondary | ICD-10-CM | POA: Diagnosis not present

## 2019-03-09 DIAGNOSIS — R799 Abnormal finding of blood chemistry, unspecified: Secondary | ICD-10-CM | POA: Diagnosis not present

## 2019-03-09 DIAGNOSIS — R739 Hyperglycemia, unspecified: Secondary | ICD-10-CM | POA: Diagnosis present

## 2019-03-09 DIAGNOSIS — Z96649 Presence of unspecified artificial hip joint: Secondary | ICD-10-CM | POA: Diagnosis present

## 2019-03-09 DIAGNOSIS — M792 Neuralgia and neuritis, unspecified: Secondary | ICD-10-CM

## 2019-03-09 DIAGNOSIS — Z91010 Allergy to peanuts: Secondary | ICD-10-CM | POA: Diagnosis not present

## 2019-03-09 DIAGNOSIS — M48061 Spinal stenosis, lumbar region without neurogenic claudication: Secondary | ICD-10-CM | POA: Diagnosis not present

## 2019-03-09 DIAGNOSIS — F1490 Cocaine use, unspecified, uncomplicated: Secondary | ICD-10-CM | POA: Diagnosis present

## 2019-03-09 DIAGNOSIS — R740 Nonspecific elevation of levels of transaminase and lactic acid dehydrogenase [LDH]: Secondary | ICD-10-CM | POA: Diagnosis not present

## 2019-03-09 DIAGNOSIS — D62 Acute posthemorrhagic anemia: Secondary | ICD-10-CM | POA: Diagnosis not present

## 2019-03-09 DIAGNOSIS — R296 Repeated falls: Secondary | ICD-10-CM | POA: Diagnosis present

## 2019-03-09 DIAGNOSIS — Z7982 Long term (current) use of aspirin: Secondary | ICD-10-CM | POA: Diagnosis not present

## 2019-03-09 DIAGNOSIS — K219 Gastro-esophageal reflux disease without esophagitis: Secondary | ICD-10-CM | POA: Diagnosis not present

## 2019-03-09 DIAGNOSIS — C61 Malignant neoplasm of prostate: Secondary | ICD-10-CM | POA: Diagnosis present

## 2019-03-09 DIAGNOSIS — E46 Unspecified protein-calorie malnutrition: Secondary | ICD-10-CM | POA: Diagnosis not present

## 2019-03-09 DIAGNOSIS — I5032 Chronic diastolic (congestive) heart failure: Secondary | ICD-10-CM | POA: Diagnosis present

## 2019-03-09 DIAGNOSIS — Z8042 Family history of malignant neoplasm of prostate: Secondary | ICD-10-CM

## 2019-03-09 DIAGNOSIS — Z1159 Encounter for screening for other viral diseases: Secondary | ICD-10-CM | POA: Diagnosis not present

## 2019-03-09 DIAGNOSIS — R51 Headache: Secondary | ICD-10-CM | POA: Diagnosis present

## 2019-03-09 DIAGNOSIS — C7949 Secondary malignant neoplasm of other parts of nervous system: Secondary | ICD-10-CM | POA: Diagnosis not present

## 2019-03-09 DIAGNOSIS — D696 Thrombocytopenia, unspecified: Secondary | ICD-10-CM | POA: Diagnosis present

## 2019-03-09 DIAGNOSIS — C801 Malignant (primary) neoplasm, unspecified: Secondary | ICD-10-CM | POA: Diagnosis not present

## 2019-03-09 DIAGNOSIS — G629 Polyneuropathy, unspecified: Secondary | ICD-10-CM | POA: Diagnosis not present

## 2019-03-09 DIAGNOSIS — R32 Unspecified urinary incontinence: Secondary | ICD-10-CM | POA: Diagnosis not present

## 2019-03-09 DIAGNOSIS — M4804 Spinal stenosis, thoracic region: Secondary | ICD-10-CM | POA: Diagnosis not present

## 2019-03-09 DIAGNOSIS — N3289 Other specified disorders of bladder: Secondary | ICD-10-CM | POA: Diagnosis present

## 2019-03-09 DIAGNOSIS — C7951 Secondary malignant neoplasm of bone: Principal | ICD-10-CM | POA: Diagnosis present

## 2019-03-09 DIAGNOSIS — Z91018 Allergy to other foods: Secondary | ICD-10-CM | POA: Diagnosis not present

## 2019-03-09 DIAGNOSIS — R0989 Other specified symptoms and signs involving the circulatory and respiratory systems: Secondary | ICD-10-CM | POA: Diagnosis not present

## 2019-03-09 DIAGNOSIS — Z993 Dependence on wheelchair: Secondary | ICD-10-CM

## 2019-03-09 DIAGNOSIS — G959 Disease of spinal cord, unspecified: Secondary | ICD-10-CM | POA: Diagnosis not present

## 2019-03-09 DIAGNOSIS — E441 Mild protein-calorie malnutrition: Secondary | ICD-10-CM | POA: Diagnosis present

## 2019-03-09 DIAGNOSIS — I10 Essential (primary) hypertension: Secondary | ICD-10-CM | POA: Diagnosis not present

## 2019-03-09 DIAGNOSIS — D497 Neoplasm of unspecified behavior of endocrine glands and other parts of nervous system: Secondary | ICD-10-CM | POA: Diagnosis not present

## 2019-03-09 DIAGNOSIS — Z923 Personal history of irradiation: Secondary | ICD-10-CM

## 2019-03-09 DIAGNOSIS — G9520 Unspecified cord compression: Secondary | ICD-10-CM | POA: Diagnosis present

## 2019-03-09 DIAGNOSIS — R7303 Prediabetes: Secondary | ICD-10-CM | POA: Diagnosis present

## 2019-03-09 DIAGNOSIS — M6281 Muscle weakness (generalized): Secondary | ICD-10-CM | POA: Diagnosis not present

## 2019-03-09 DIAGNOSIS — X58XXXA Exposure to other specified factors, initial encounter: Secondary | ICD-10-CM | POA: Diagnosis present

## 2019-03-09 DIAGNOSIS — E8809 Other disorders of plasma-protein metabolism, not elsewhere classified: Secondary | ICD-10-CM | POA: Diagnosis present

## 2019-03-09 DIAGNOSIS — T380X5A Adverse effect of glucocorticoids and synthetic analogues, initial encounter: Secondary | ICD-10-CM | POA: Diagnosis present

## 2019-03-09 DIAGNOSIS — K5900 Constipation, unspecified: Secondary | ICD-10-CM | POA: Diagnosis present

## 2019-03-09 DIAGNOSIS — R410 Disorientation, unspecified: Secondary | ICD-10-CM | POA: Diagnosis not present

## 2019-03-09 DIAGNOSIS — Z4789 Encounter for other orthopedic aftercare: Secondary | ICD-10-CM | POA: Diagnosis present

## 2019-03-09 DIAGNOSIS — K592 Neurogenic bowel, not elsewhere classified: Secondary | ICD-10-CM | POA: Diagnosis present

## 2019-03-09 DIAGNOSIS — C412 Malignant neoplasm of vertebral column: Secondary | ICD-10-CM | POA: Diagnosis not present

## 2019-03-09 DIAGNOSIS — G9529 Other cord compression: Secondary | ICD-10-CM | POA: Diagnosis not present

## 2019-03-09 DIAGNOSIS — E785 Hyperlipidemia, unspecified: Secondary | ICD-10-CM | POA: Diagnosis not present

## 2019-03-09 DIAGNOSIS — G8220 Paraplegia, unspecified: Secondary | ICD-10-CM | POA: Diagnosis not present

## 2019-03-09 DIAGNOSIS — E7800 Pure hypercholesterolemia, unspecified: Secondary | ICD-10-CM | POA: Diagnosis not present

## 2019-03-09 DIAGNOSIS — I110 Hypertensive heart disease with heart failure: Secondary | ICD-10-CM | POA: Diagnosis present

## 2019-03-09 DIAGNOSIS — Z192 Hormone resistant malignancy status: Secondary | ICD-10-CM | POA: Diagnosis not present

## 2019-03-09 DIAGNOSIS — Z888 Allergy status to other drugs, medicaments and biological substances status: Secondary | ICD-10-CM | POA: Diagnosis not present

## 2019-03-09 DIAGNOSIS — I5030 Unspecified diastolic (congestive) heart failure: Secondary | ICD-10-CM | POA: Diagnosis not present

## 2019-03-09 DIAGNOSIS — Z79818 Long term (current) use of other agents affecting estrogen receptors and estrogen levels: Secondary | ICD-10-CM | POA: Diagnosis not present

## 2019-03-09 DIAGNOSIS — Z79899 Other long term (current) drug therapy: Secondary | ICD-10-CM | POA: Diagnosis not present

## 2019-03-09 HISTORY — PX: LAMINECTOMY: SHX219

## 2019-03-09 LAB — TYPE AND SCREEN
ABO/RH(D): O POS
Antibody Screen: NEGATIVE

## 2019-03-09 LAB — ABO/RH: ABO/RH(D): O POS

## 2019-03-09 LAB — PROTIME-INR
INR: 1.1 (ref 0.8–1.2)
Prothrombin Time: 13.8 seconds (ref 11.4–15.2)

## 2019-03-09 LAB — SARS CORONAVIRUS 2 BY RT PCR (HOSPITAL ORDER, PERFORMED IN ~~LOC~~ HOSPITAL LAB): SARS Coronavirus 2: NEGATIVE

## 2019-03-09 IMAGING — MR MRI CERVICAL SPINE WITHOUT CONTRAST
4 of 12 series · 15 of 48 positions shown · non-contrast
Comparison: MR lumbar spine [DATE]. Abdominopelvic CT
[DATE].
COMPARISON: MR lumbar spine [DATE]. Abdominopelvic CT
[DATE].

Addendum:
CLINICAL DATA: Bilateral extremity weakness with inability to
ambulate. History of prostate cancer with osseous metastatic
disease. Bowel changes.

EXAM:
MRI CERVICAL AND THORACIC SPINE WITHOUT CONTRAST
TECHNIQUE: Multiplanar and multiecho pulse sequences of the cervical spine, to
include the cervicothoracic junction, and the thoracic spine, were
obtained without intravenous contrast.

[Series 2: T2 · sagittal · 3.0mm · 0.41mm/px · 2 of 13 slices shown (1 of 4)]
[im 1/13]
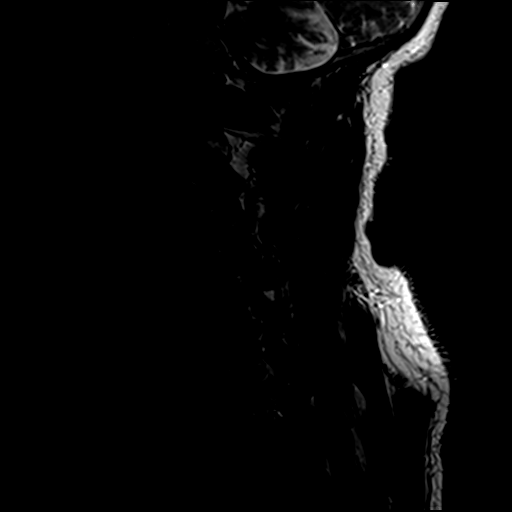
[im 13/13]
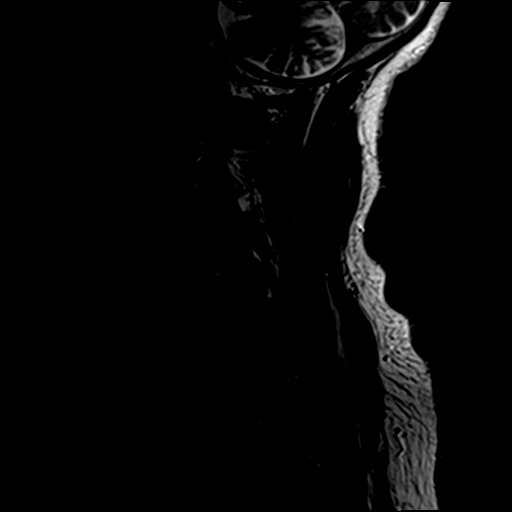

[Series 9: T2 · axial · 3.0mm · 0.94mm/px · z∈[-103,-8]mm · 5 of 26 slices shown (2 of 4)]
[im 1/26]
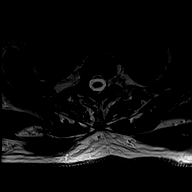
[im 7/26]
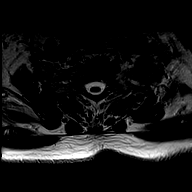
[im 13/26]
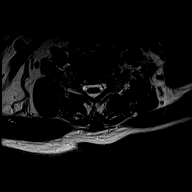
[im 19/26]
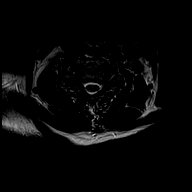
[im 26/26]
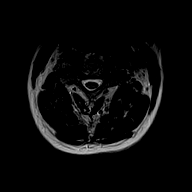

[Series 14: T2 · sagittal · 4.0mm · 0.38mm/px · 3 of 12 slices shown (3 of 4)]
[im 1/12]
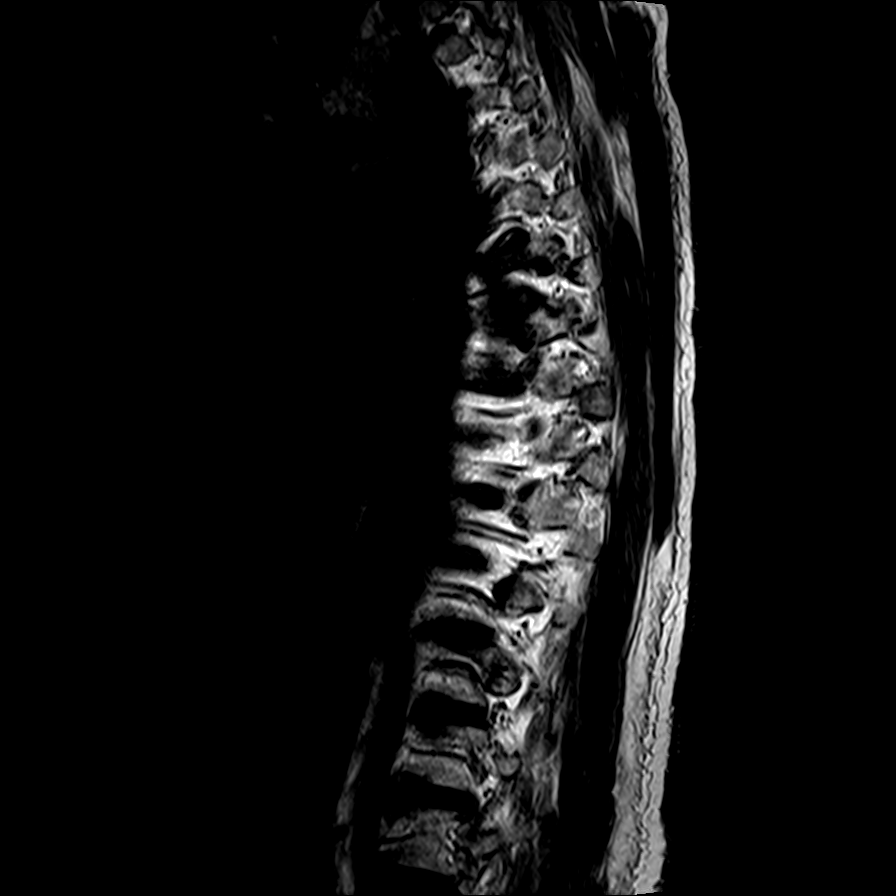
[im 6/12]
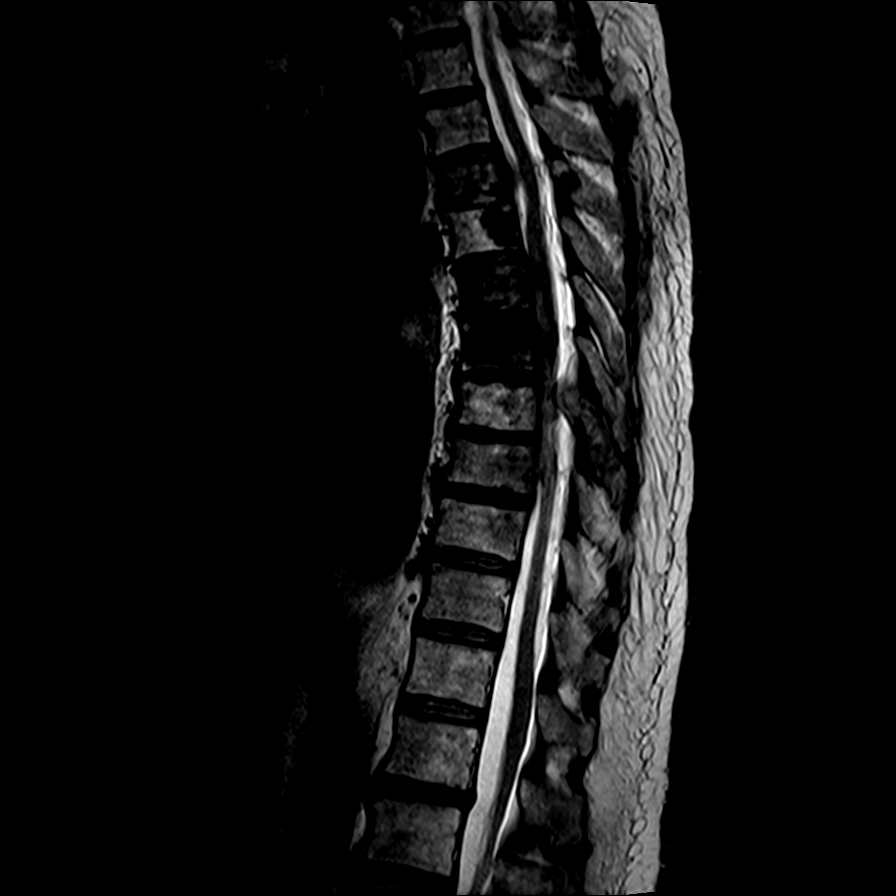
[im 12/12]
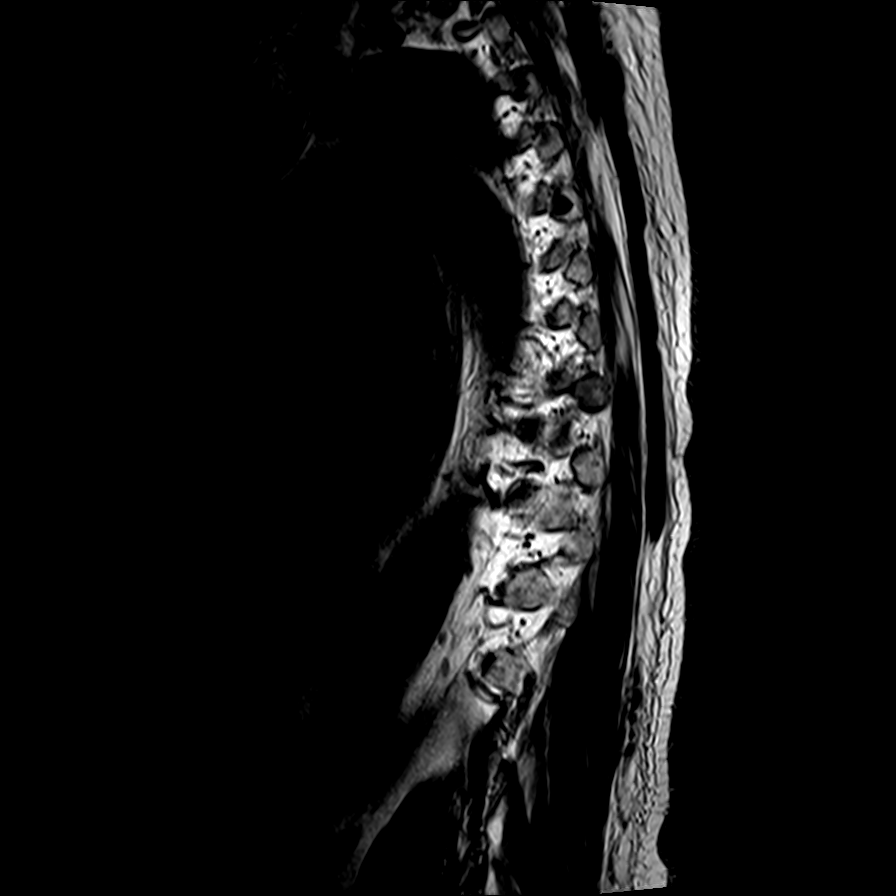

[Series 17: T2 · axial · 4.0mm · 0.52mm/px · z∈[-346,-115]mm · 5 of 41 slices shown (4 of 4)]
[im 1/41]
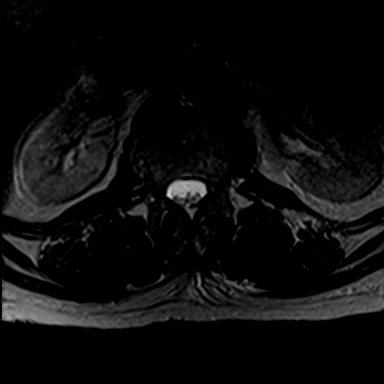
[im 6/41]
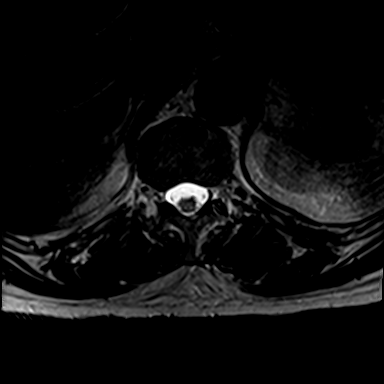
[im 11/41]
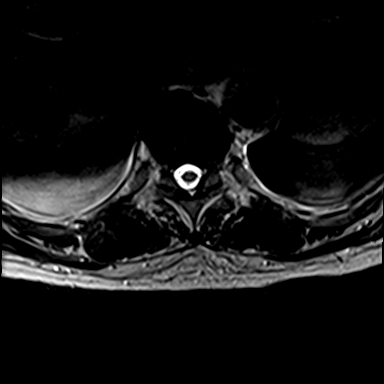
[im 21/41]
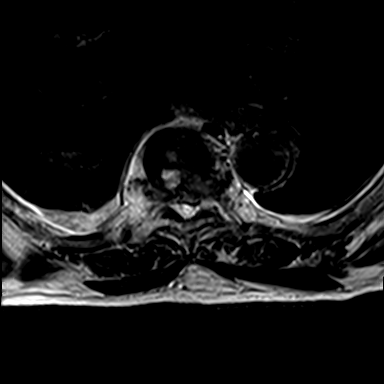
[im 36/41]
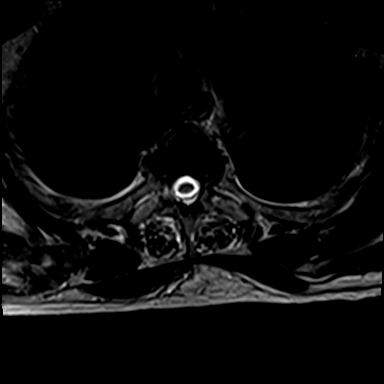

[15 of 48 positions shown; findings below may reference images not displayed]

FINDINGS: MRI CERVICAL SPINE FINDINGS

Alignment: Straightening without focal angulation or listhesis.

Vertebrae: No evidence of acute fracture. Probable ankylosis across
the C2-3 disc and facet joints. There is a small lesion posteriorly
in the C3 vertebral body which is best seen on image [DATE], likely a
metastasis. No other definite metastases within the cervical spine.

Cord: Normal in signal and caliber.

Posterior Fossa, vertebral arteries, paraspinal tissues: The
craniocervical junction appears normal. There are bilateral
vertebral artery flow voids. No significant paraspinal findings.

Disc levels:

C2-3: As above, probable ankylosis across the vertebral body and
facet joints. The axial images do not extend across this disc space
level.

C3-4: Spondylosis with disc bulging, uncinate spurring and facet
hypertrophy. Mild spinal stenosis with mild to moderate foraminal
narrowing bilaterally. No cord deformity.

C4-5: Spondylosis with uncinate spurring and facet hypertrophy. Mild
spinal stenosis with mild to moderate foraminal narrowing
bilaterally. No cord deformity.

C5-6: Spondylosis with uncinate spurring and facet hypertrophy. No
cord deformity. Mild to moderate foraminal narrowing bilaterally.

C6-7: Spondylosis with posterior osteophytes and bilateral uncinate
spurring. No cord deformity. Mild to moderate foraminal narrowing
bilaterally.

C7-T1: Spondylosis with posterior osteophytes contributing to severe
foraminal narrowing bilaterally. No cord deformity.

MRI THORACIC SPINE FINDINGS

Alignment:  Normal.

Vertebrae: There is widespread osseous metastatic disease within the
midthoracic spine with involvement of all the vertebral bodies from
T4 through T10. There is also involvement of the posterior elements
at T4 and T8. There is a mild pathologic fracture at T4 with osseous
retropulsion. The right T8 pedicle and lamina appear expanded with
possible adjacent epidural tumor.

Cord: There is cord compression, most notably at the T4 and T8
levels. Mild cord T2 hyperintensity is present at those levels. No
definite cord hemorrhage. The conus medullaris extends to the upper
L2 level.

Paraspinal and other soft tissues: There is paraspinous edema in the
midthoracic spine and small bilateral pleural effusions.

Disc levels:

No significant disc space findings at T1-2, T2-3 or T3-4.

T4-5: No significant disc pathology. As above, there is a T4
metastasis with a mild pathologic fracture and possible ventral
epidural tumor. There is mild cord flattening.

T5-6: No significant findings.

T6-7: There is osseous metastatic disease within the T6 and T7
vertebral bodies with prominence of the posterior epidural fat. The
CSF surrounding the cord is effaced without apparent focal epidural
tumor at this level.

T7-8: There is osseous metastatic disease with posterior epidural
tumor, especially within the right T8 pedicle. There is resulting
mild cord compression and cord T2 hyperintensity.

T8-9: The CSF surrounding the cord is effaced without focal epidural
tumor.

Mild degenerative changes are present within the lower thoracic
spine with disc bulging at T9-10 and L1-2. No cord deformity.
IMPRESSION: 1. Multifocal osseous metastatic disease in the midthoracic spine
extending from T4 through T10. Mild pathologic fracture at T4.
2. Cord compression at the T4 and T8 levels. The CSF surrounding the
cord is largely effaced from T4 through T9.
3. Probable solitary metastasis within the C3 vertebral body. No
evidence of cord compression in the cervical spine.
4. Multilevel cervical spondylosis.

ADDENDUM:
Critical Value/emergent results were called by telephone at the time
of interpretation on [DATE] at [DATE] to Dr. MIEHLEKETO , who
verbally acknowledged these results.

*** End of Addendum ***
FINDINGS: MRI CERVICAL SPINE FINDINGS

Alignment: Straightening without focal angulation or listhesis.

Vertebrae: No evidence of acute fracture. Probable ankylosis across
the C2-3 disc and facet joints. There is a small lesion posteriorly
in the C3 vertebral body which is best seen on image [DATE], likely a
metastasis. No other definite metastases within the cervical spine.

Cord: Normal in signal and caliber.

Posterior Fossa, vertebral arteries, paraspinal tissues: The
craniocervical junction appears normal. There are bilateral
vertebral artery flow voids. No significant paraspinal findings.

Disc levels:

C2-3: As above, probable ankylosis across the vertebral body and
facet joints. The axial images do not extend across this disc space
level.

C3-4: Spondylosis with disc bulging, uncinate spurring and facet
hypertrophy. Mild spinal stenosis with mild to moderate foraminal
narrowing bilaterally. No cord deformity.

C4-5: Spondylosis with uncinate spurring and facet hypertrophy. Mild
spinal stenosis with mild to moderate foraminal narrowing
bilaterally. No cord deformity.

C5-6: Spondylosis with uncinate spurring and facet hypertrophy. No
cord deformity. Mild to moderate foraminal narrowing bilaterally.

C6-7: Spondylosis with posterior osteophytes and bilateral uncinate
spurring. No cord deformity. Mild to moderate foraminal narrowing
bilaterally.

C7-T1: Spondylosis with posterior osteophytes contributing to severe
foraminal narrowing bilaterally. No cord deformity.

MRI THORACIC SPINE FINDINGS

Alignment:  Normal.

Vertebrae: There is widespread osseous metastatic disease within the
midthoracic spine with involvement of all the vertebral bodies from
T4 through T10. There is also involvement of the posterior elements
at T4 and T8. There is a mild pathologic fracture at T4 with osseous
retropulsion. The right T8 pedicle and lamina appear expanded with
possible adjacent epidural tumor.

Cord: There is cord compression, most notably at the T4 and T8
levels. Mild cord T2 hyperintensity is present at those levels. No
definite cord hemorrhage. The conus medullaris extends to the upper
L2 level.

Paraspinal and other soft tissues: There is paraspinous edema in the
midthoracic spine and small bilateral pleural effusions.

Disc levels:

No significant disc space findings at T1-2, T2-3 or T3-4.

T4-5: No significant disc pathology. As above, there is a T4
metastasis with a mild pathologic fracture and possible ventral
epidural tumor. There is mild cord flattening.

T5-6: No significant findings.

T6-7: There is osseous metastatic disease within the T6 and T7
vertebral bodies with prominence of the posterior epidural fat. The
CSF surrounding the cord is effaced without apparent focal epidural
tumor at this level.

T7-8: There is osseous metastatic disease with posterior epidural
tumor, especially within the right T8 pedicle. There is resulting
mild cord compression and cord T2 hyperintensity.

T8-9: The CSF surrounding the cord is effaced without focal epidural
tumor.

Mild degenerative changes are present within the lower thoracic
spine with disc bulging at T9-10 and L1-2. No cord deformity.
IMPRESSION: 1. Multifocal osseous metastatic disease in the midthoracic spine
extending from T4 through T10. Mild pathologic fracture at T4.
2. Cord compression at the T4 and T8 levels. The CSF surrounding the
cord is largely effaced from T4 through T9.
3. Probable solitary metastasis within the C3 vertebral body. No
evidence of cord compression in the cervical spine.
4. Multilevel cervical spondylosis.

## 2019-03-09 IMAGING — DX THORACIC SPINE - 4+ VIEW
2 series · 4 of 4 positions shown · non-contrast
Comparison: Cervical and thoracic spine MRI earlier today.

Lumbar MRI [DATE]. CT Abdomen and Pelvis [DATE]. Chest
radiographs [DATE].

CLINICAL DATA: 72-year-old male undergoing T7 through T9
laminectomy for spinal metastatic disease with cord compression.

EXAM:
THORACIC SPINE - 4+ VIEW

[t-spine ap]
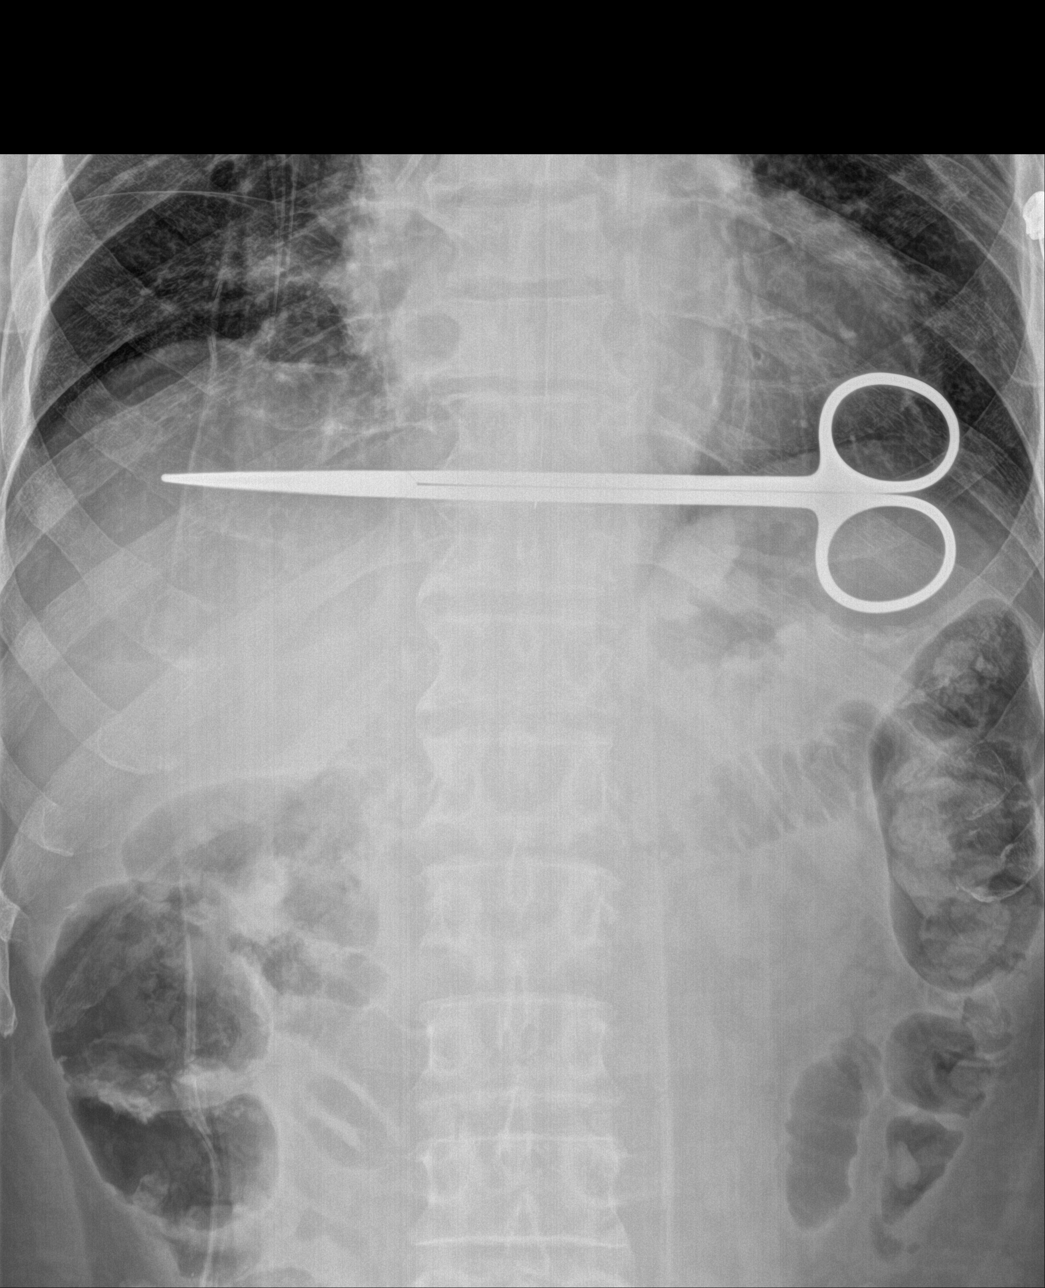

[Series 2: t-spine lat · 0.14mm/px · 3 of 3 slices shown]
[im 1/3]
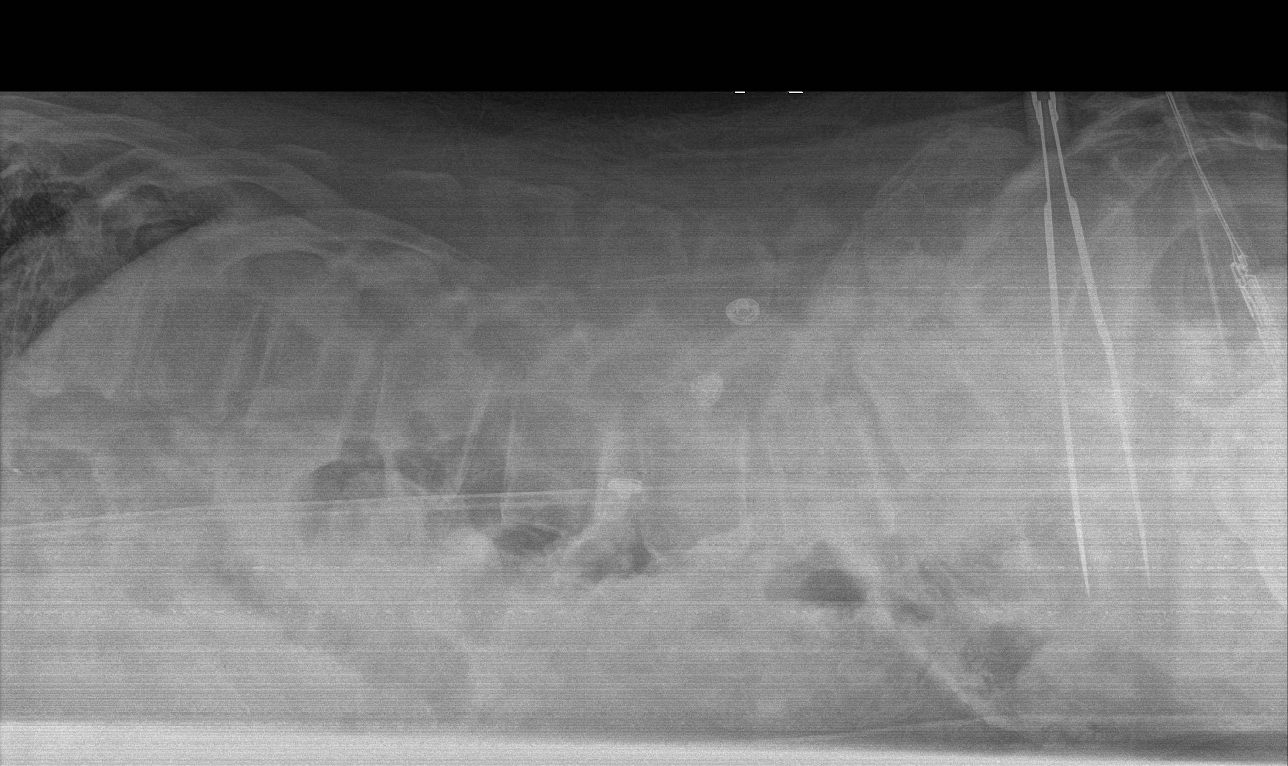
[im 2/3]
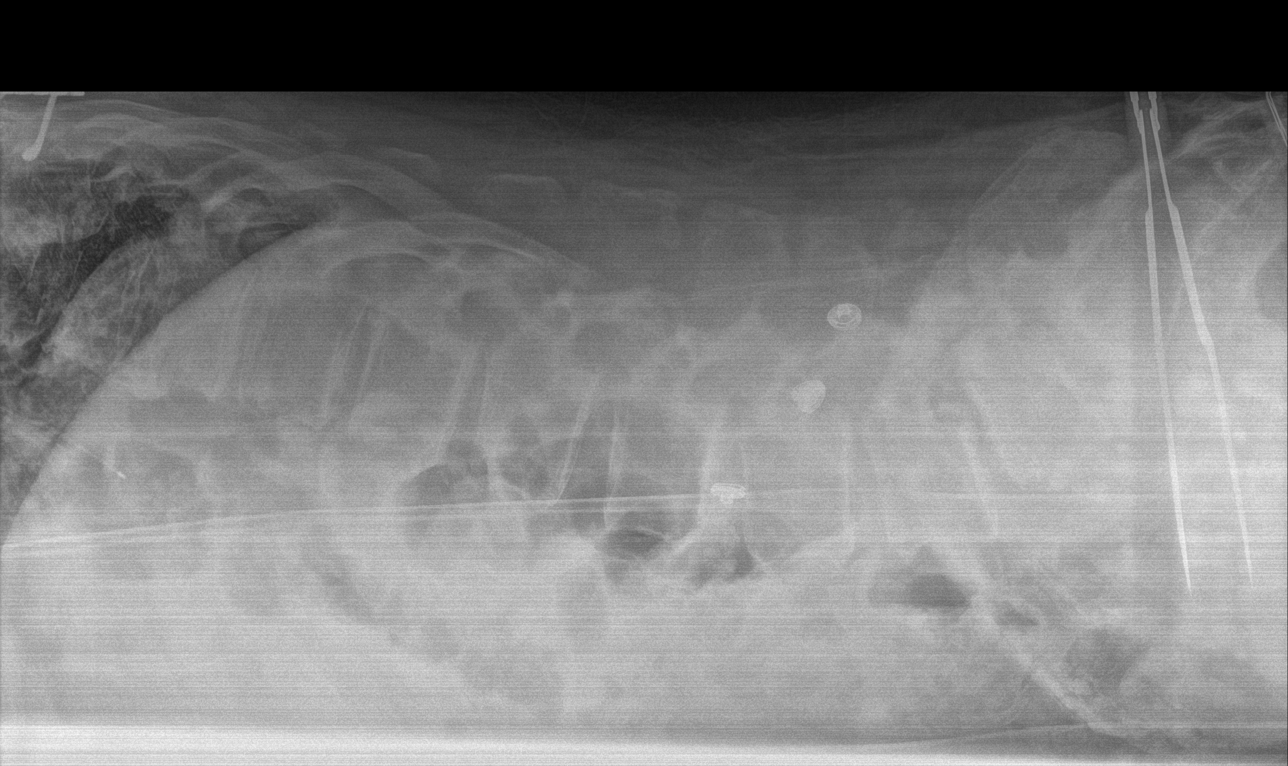
[im 3/3]
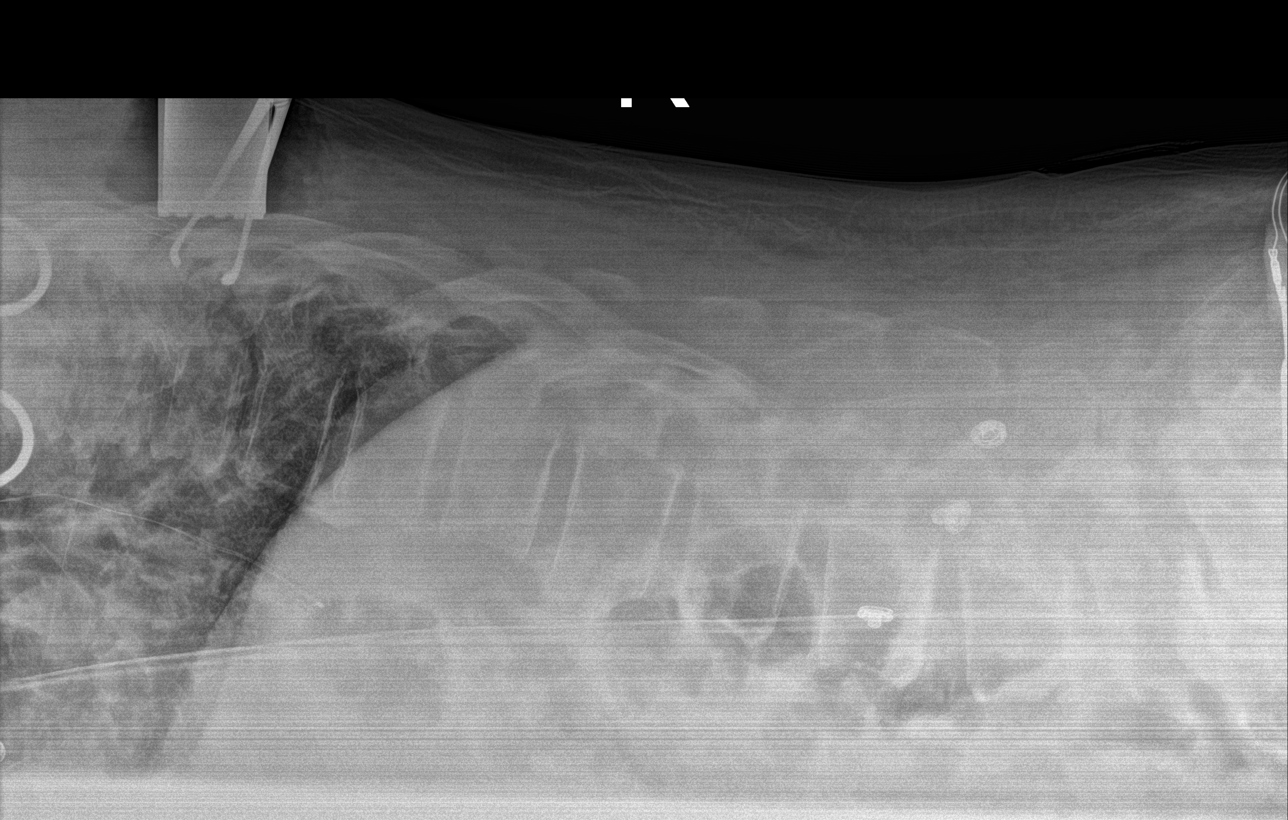

[4 of 4 positions shown; findings below may reference images not displayed]

FINDINGS: Transitional lumbosacral anatomy suspected when comparing these
various exams. The same numbering system will be used as on the
thoracic spine MRI today, with full size ribs suspected at T12 and
hypoplastic ribs at L1. Subsequently, there is a fully lumbarized S1
level suspected which differs from the lumbar spine numbering on
prior CT and MRI.

Portable AP supine view at at [4Y] hours. The T10-T11 disc spaces
marked with scissors.

Portable intraoperative cross-table lateral views of the thoracic
and lumbar spine. These cross-table lateral images demonstrate
thoracic spine localization at T8-T9.
IMPRESSION: 1. Transitional lumbosacral anatomy suspected, probably with a fully
lumbarized S1 level which differs from the MRI and CT numbering
[DATE] and [DATE].
2. Same numbering system used as on the cervical and thoracic spine
MRI earlier today.
3. Intraoperative localization at T10-T11 on the AP and T8-T9 on the
cross-table lateral views.

## 2019-03-09 SURGERY — THORACIC LAMINECTOMY FOR TUMOR
Anesthesia: General | Site: Back

## 2019-03-09 MED ORDER — THROMBIN 5000 UNITS EX SOLR
CUTANEOUS | Status: AC
  Filled 2019-03-09: qty 5000

## 2019-03-09 MED ORDER — THROMBIN 5000 UNITS EX SOLR
OROMUCOSAL | Status: DC | PRN
  Administered 2019-03-09 (×3): 5 mL via TOPICAL

## 2019-03-09 MED ORDER — ONDANSETRON HCL 4 MG/2ML IJ SOLN
INTRAMUSCULAR | Status: AC
  Filled 2019-03-09: qty 2

## 2019-03-09 MED ORDER — SUGAMMADEX SODIUM 500 MG/5ML IV SOLN
INTRAVENOUS | Status: DC | PRN
  Administered 2019-03-09: 200 mg via INTRAVENOUS

## 2019-03-09 MED ORDER — PHENYLEPHRINE 40 MCG/ML (10ML) SYRINGE FOR IV PUSH (FOR BLOOD PRESSURE SUPPORT)
PREFILLED_SYRINGE | INTRAVENOUS | Status: AC
  Filled 2019-03-09: qty 10

## 2019-03-09 MED ORDER — ROCURONIUM BROMIDE 100 MG/10ML IV SOLN
INTRAVENOUS | Status: DC | PRN
  Administered 2019-03-09: 50 mg via INTRAVENOUS

## 2019-03-09 MED ORDER — DEXAMETHASONE SODIUM PHOSPHATE 10 MG/ML IJ SOLN
10.0000 mg | Freq: Once | INTRAMUSCULAR | Status: AC
  Administered 2019-03-09: 20:00:00 10 mg via INTRAVENOUS
  Filled 2019-03-09: qty 1

## 2019-03-09 MED ORDER — ROCURONIUM BROMIDE 10 MG/ML (PF) SYRINGE
PREFILLED_SYRINGE | INTRAVENOUS | Status: AC
  Filled 2019-03-09: qty 10

## 2019-03-09 MED ORDER — SODIUM CHLORIDE 0.9 % IV SOLN
INTRAVENOUS | Status: DC | PRN
  Administered 2019-03-09: 500 mL

## 2019-03-09 MED ORDER — ARTIFICIAL TEARS OPHTHALMIC OINT
TOPICAL_OINTMENT | OPHTHALMIC | Status: AC
  Filled 2019-03-09: qty 3.5

## 2019-03-09 MED ORDER — FENTANYL CITRATE (PF) 250 MCG/5ML IJ SOLN
INTRAMUSCULAR | Status: AC
  Filled 2019-03-09: qty 5

## 2019-03-09 MED ORDER — LACTATED RINGERS IV SOLN
INTRAVENOUS | Status: DC | PRN
  Administered 2019-03-09: 21:00:00 via INTRAVENOUS

## 2019-03-09 MED ORDER — BUPIVACAINE HCL (PF) 0.25 % IJ SOLN
INTRAMUSCULAR | Status: DC | PRN
  Administered 2019-03-09: 15 mL

## 2019-03-09 MED ORDER — SODIUM CHLORIDE (PF) 0.9 % IJ SOLN
INTRAMUSCULAR | Status: AC
  Filled 2019-03-09: qty 10

## 2019-03-09 MED ORDER — PROPOFOL 10 MG/ML IV BOLUS
INTRAVENOUS | Status: AC
  Filled 2019-03-09: qty 20

## 2019-03-09 MED ORDER — EPHEDRINE 5 MG/ML INJ
INTRAVENOUS | Status: AC
  Filled 2019-03-09: qty 10

## 2019-03-09 MED ORDER — FENTANYL CITRATE (PF) 250 MCG/5ML IJ SOLN
INTRAMUSCULAR | Status: DC | PRN
  Administered 2019-03-09 (×2): 50 ug via INTRAVENOUS
  Administered 2019-03-09: 100 ug via INTRAVENOUS
  Administered 2019-03-09: 50 ug via INTRAVENOUS

## 2019-03-09 MED ORDER — LABETALOL HCL 5 MG/ML IV SOLN
5.0000 mg | INTRAVENOUS | Status: DC | PRN
  Administered 2019-03-09: 5 mg via INTRAVENOUS

## 2019-03-09 MED ORDER — BUPIVACAINE HCL (PF) 0.25 % IJ SOLN
INTRAMUSCULAR | Status: AC
  Filled 2019-03-09: qty 30

## 2019-03-09 MED ORDER — DEXAMETHASONE SODIUM PHOSPHATE 10 MG/ML IJ SOLN
10.0000 mg | Freq: Four times a day (QID) | INTRAMUSCULAR | Status: DC
  Administered 2019-03-10 – 2019-03-11 (×8): 10 mg via INTRAVENOUS
  Filled 2019-03-09 (×8): qty 1

## 2019-03-09 MED ORDER — 0.9 % SODIUM CHLORIDE (POUR BTL) OPTIME
TOPICAL | Status: DC | PRN
  Administered 2019-03-09: 1000 mL

## 2019-03-09 MED ORDER — SUCCINYLCHOLINE CHLORIDE 20 MG/ML IJ SOLN
INTRAMUSCULAR | Status: DC | PRN
  Administered 2019-03-09: 120 mg via INTRAVENOUS

## 2019-03-09 MED ORDER — LACTATED RINGERS IV SOLN
INTRAVENOUS | Status: DC | PRN
  Administered 2019-03-09 (×2): via INTRAVENOUS

## 2019-03-09 MED ORDER — MIDAZOLAM HCL 2 MG/2ML IJ SOLN
INTRAMUSCULAR | Status: AC
  Filled 2019-03-09: qty 2

## 2019-03-09 MED ORDER — PROPOFOL 10 MG/ML IV BOLUS
INTRAVENOUS | Status: DC | PRN
  Administered 2019-03-09: 130 mg via INTRAVENOUS
  Administered 2019-03-09: 40 mg via INTRAVENOUS
  Administered 2019-03-09: 20 mg via INTRAVENOUS

## 2019-03-09 MED ORDER — THROMBIN 20000 UNITS EX SOLR
CUTANEOUS | Status: DC | PRN
  Administered 2019-03-09: 20 mL via TOPICAL

## 2019-03-09 MED ORDER — SODIUM CHLORIDE 0.9 % IV BOLUS
1000.0000 mL | Freq: Once | INTRAVENOUS | Status: AC
  Administered 2019-03-09: 1000 mL via INTRAVENOUS

## 2019-03-09 MED ORDER — LABETALOL HCL 5 MG/ML IV SOLN
INTRAVENOUS | Status: AC
  Filled 2019-03-09: qty 4

## 2019-03-09 MED ORDER — CEFAZOLIN SODIUM-DEXTROSE 2-4 GM/100ML-% IV SOLN
2.0000 g | INTRAVENOUS | Status: AC
  Administered 2019-03-09: 2 g via INTRAVENOUS
  Filled 2019-03-09: qty 100

## 2019-03-09 MED ORDER — DEXAMETHASONE SODIUM PHOSPHATE 10 MG/ML IJ SOLN
INTRAMUSCULAR | Status: AC
  Filled 2019-03-09: qty 1

## 2019-03-09 MED ORDER — SUCCINYLCHOLINE CHLORIDE 200 MG/10ML IV SOSY
PREFILLED_SYRINGE | INTRAVENOUS | Status: AC
  Filled 2019-03-09: qty 10

## 2019-03-09 MED ORDER — LIDOCAINE HCL (CARDIAC) PF 100 MG/5ML IV SOSY
PREFILLED_SYRINGE | INTRAVENOUS | Status: DC | PRN
  Administered 2019-03-09: 60 mg via INTRATRACHEAL

## 2019-03-09 MED ORDER — ONDANSETRON HCL 4 MG/2ML IJ SOLN
INTRAMUSCULAR | Status: DC | PRN
  Administered 2019-03-09: 4 mg via INTRAVENOUS

## 2019-03-09 MED ORDER — ESMOLOL HCL 100 MG/10ML IV SOLN
INTRAVENOUS | Status: DC | PRN
  Administered 2019-03-09: 30 mg via INTRAVENOUS

## 2019-03-09 MED ORDER — DEXAMETHASONE SODIUM PHOSPHATE 10 MG/ML IJ SOLN
INTRAMUSCULAR | Status: DC | PRN
  Administered 2019-03-09: 10 mg via INTRAVENOUS

## 2019-03-09 MED ORDER — FENTANYL CITRATE (PF) 100 MCG/2ML IJ SOLN
25.0000 ug | INTRAMUSCULAR | Status: DC | PRN
  Administered 2019-03-10: 25 ug via INTRAVENOUS

## 2019-03-09 MED ORDER — LIDOCAINE 2% (20 MG/ML) 5 ML SYRINGE
INTRAMUSCULAR | Status: AC
  Filled 2019-03-09: qty 5

## 2019-03-09 MED ORDER — ONDANSETRON HCL 4 MG/2ML IJ SOLN: 4.0000 mg | Freq: Once | INTRAMUSCULAR | Status: DC | PRN

## 2019-03-09 MED ORDER — SODIUM CHLORIDE 0.9 % IV SOLN
INTRAVENOUS | Status: DC | PRN
  Administered 2019-03-09: 21:00:00 via INTRAVENOUS

## 2019-03-09 MED ORDER — THROMBIN 20000 UNITS EX SOLR
CUTANEOUS | Status: AC
  Filled 2019-03-09: qty 20000

## 2019-03-09 MED ORDER — MIDAZOLAM HCL 5 MG/5ML IJ SOLN
INTRAMUSCULAR | Status: DC | PRN
  Administered 2019-03-09: 2 mg via INTRAVENOUS

## 2019-03-09 SURGICAL SUPPLY — 67 items
BAG DECANTER FOR FLEXI CONT (MISCELLANEOUS) ×3 IMPLANT
BENZOIN TINCTURE PRP APPL 2/3 (GAUZE/BANDAGES/DRESSINGS) ×3 IMPLANT
BLADE CLIPPER SURG (BLADE) IMPLANT
BLADE SURG 11 STRL SS (BLADE) IMPLANT
BUR CUTTER 7.0 ROUND (BURR) IMPLANT
BUR MATCHSTICK NEURO 3.0 LAGG (BURR) IMPLANT
CANISTER SUCT 3000ML PPV (MISCELLANEOUS) ×3 IMPLANT
CARTRIDGE OIL MAESTRO DRILL (MISCELLANEOUS) ×1 IMPLANT
CLOSURE STERI-STRIP 1/2X4 (GAUZE/BANDAGES/DRESSINGS) ×1
CLOSURE WOUND 1/2 X4 (GAUZE/BANDAGES/DRESSINGS) ×1
CLSR STERI-STRIP ANTIMIC 1/2X4 (GAUZE/BANDAGES/DRESSINGS) ×2 IMPLANT
COVER WAND RF STERILE (DRAPES) ×3 IMPLANT
DERMABOND ADVANCED (GAUZE/BANDAGES/DRESSINGS) ×2
DERMABOND ADVANCED .7 DNX12 (GAUZE/BANDAGES/DRESSINGS) ×1 IMPLANT
DIFFUSER DRILL AIR PNEUMATIC (MISCELLANEOUS) ×3 IMPLANT
DRAPE LAPAROTOMY 100X72X124 (DRAPES) ×3 IMPLANT
DRAPE LAPAROTOMY T 102X78X121 (DRAPES) IMPLANT
DRAPE MICROSCOPE LEICA (MISCELLANEOUS) ×3 IMPLANT
DRAPE POUCH INSTRU U-SHP 10X18 (DRAPES) ×3 IMPLANT
DRAPE SURG 17X23 STRL (DRAPES) ×6 IMPLANT
DRSG OPSITE POSTOP 4X6 (GAUZE/BANDAGES/DRESSINGS) ×3 IMPLANT
ELECT REM PT RETURN 9FT ADLT (ELECTROSURGICAL) ×3
ELECTRODE REM PT RTRN 9FT ADLT (ELECTROSURGICAL) ×1 IMPLANT
GAUZE 4X4 16PLY RFD (DISPOSABLE) IMPLANT
GAUZE SPONGE 4X4 12PLY STRL (GAUZE/BANDAGES/DRESSINGS) ×3 IMPLANT
GLOVE BIO SURGEON STRL SZ7 (GLOVE) ×6 IMPLANT
GLOVE BIOGEL PI IND STRL 7.5 (GLOVE) ×2 IMPLANT
GLOVE BIOGEL PI INDICATOR 7.5 (GLOVE) ×4
GLOVE ECLIPSE 9.0 STRL (GLOVE) ×3 IMPLANT
GLOVE EXAM NITRILE XL STR (GLOVE) IMPLANT
GOWN STRL REUS W/ TWL LRG LVL3 (GOWN DISPOSABLE) IMPLANT
GOWN STRL REUS W/ TWL XL LVL3 (GOWN DISPOSABLE) IMPLANT
GOWN STRL REUS W/TWL 2XL LVL3 (GOWN DISPOSABLE) IMPLANT
GOWN STRL REUS W/TWL LRG LVL3 (GOWN DISPOSABLE)
GOWN STRL REUS W/TWL XL LVL3 (GOWN DISPOSABLE)
HEMOSTAT SURGICEL 2X14 (HEMOSTASIS) IMPLANT
KIT BASIN OR (CUSTOM PROCEDURE TRAY) ×3 IMPLANT
KIT TURNOVER KIT B (KITS) ×3 IMPLANT
NEEDLE HYPO 22GX1.5 SAFETY (NEEDLE) ×3 IMPLANT
NEEDLE HYPO 25X1 1.5 SAFETY (NEEDLE) ×3 IMPLANT
NEEDLE SPNL 20GX3.5 QUINCKE YW (NEEDLE) ×3 IMPLANT
NS IRRIG 1000ML POUR BTL (IV SOLUTION) ×3 IMPLANT
OIL CARTRIDGE MAESTRO DRILL (MISCELLANEOUS) ×3
PACK LAMINECTOMY NEURO (CUSTOM PROCEDURE TRAY) ×3 IMPLANT
PATTIES SURGICAL .5 X.5 (GAUZE/BANDAGES/DRESSINGS) IMPLANT
PATTIES SURGICAL .5 X3 (DISPOSABLE) ×3 IMPLANT
PATTIES SURGICAL 1X1 (DISPOSABLE) ×3 IMPLANT
RUBBERBAND STERILE (MISCELLANEOUS) ×6 IMPLANT
SPONGE LAP 4X18 RFD (DISPOSABLE) IMPLANT
SPONGE SURGIFOAM ABS GEL 100 (HEMOSTASIS) IMPLANT
STAPLER VISISTAT 35W (STAPLE) ×3 IMPLANT
STRIP CLOSURE SKIN 1/2X4 (GAUZE/BANDAGES/DRESSINGS) ×2 IMPLANT
SUT BONE WAX W31G (SUTURE) IMPLANT
SUT ETHILON 4 0 PS 2 18 (SUTURE) ×3 IMPLANT
SUT NURALON 4 0 TR CR/8 (SUTURE) ×3 IMPLANT
SUT PROLENE 6 0 BV (SUTURE) IMPLANT
SUT VIC AB 0 CT1 18XCR BRD8 (SUTURE) ×1 IMPLANT
SUT VIC AB 0 CT1 8-18 (SUTURE) ×2
SUT VIC AB 2-0 CT1 18 (SUTURE) ×3 IMPLANT
SUT VIC AB 3-0 SH 8-18 (SUTURE) ×3 IMPLANT
SUT VICRYL 4-0 PS2 18IN ABS (SUTURE) IMPLANT
TOWEL GREEN STERILE (TOWEL DISPOSABLE) ×3 IMPLANT
TOWEL GREEN STERILE FF (TOWEL DISPOSABLE) ×3 IMPLANT
TRAY FOLEY MTR SLVR 16FR STAT (SET/KITS/TRAYS/PACK) IMPLANT
TUBE CONNECTING 12'X1/4 (SUCTIONS)
TUBE CONNECTING 12X1/4 (SUCTIONS) IMPLANT
WATER STERILE IRR 1000ML POUR (IV SOLUTION) ×3 IMPLANT

## 2019-03-09 NOTE — ED Notes (Signed)
Patient port was accessed. Blood pulled back, but not enough for labs at this time. [facility] here to pick up the patient.

## 2019-03-09 NOTE — Consult Note (Signed)
Reason for Consult: Weakness Referring Physician: Dr. Tiney Rouge is an 73 y.o. male.  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  Patient status post recent treatment with Zomig.  Patient has been experiencing progressive bilateral lower extremity weakness.  He has changes in sensation from his mid chest distally.  He has no longer able to ambulate.  He has gone from being able to ambulate independently to using a walker and now is wheelchair dependent.  He still has some bladder control.  He has diagnosis of neuropathy but his symptoms have progressed in a typical way for this.  He has had a recent MRI scan of his cervical and thoracic spine.  Thoracic MRI scanning demonstrates evidence of extensive metastatic disease to his thoracic spine with moderate spinal stenosis at T4.  At T8 the patient has evidence of severe dorsal epidural tumor with marked spinal cord compression.  Past Medical History:  Diagnosis Date  . Hypertension   . Metastatic cancer to bone ([place]) dx'd 2018  . Prostate cancer [facility])     Past Surgical History:  Procedure Laterality Date  . HIP SURGERY Bilateral   . IR IMAGING GUIDED PORT INSERTION  04/22/2018    Family History  Problem Relation Age of Onset  . Prostate cancer Father     Social History:  reports that he quit smoking about 4 years ago. His smoking use included cigarettes. He has a 12.50 pack-year smoking history. He has never used smokeless tobacco. He reports current alcohol use. He reports current drug use. Drug: Marijuana.  Allergies:  Allergies  Allergen Reactions  . Lisinopril Swelling    Facial swelling  . Cat Hair Extract     eyes swell  . Mangifera Indica     MANGO --  . Mango Flavor     throat swells with mango  . Other   . Peanut-Containing Drug Products   . Pistachio Nut (Diagnostic)     hands and feet burn/itch  . Sunflower Oil     Sunflower seed allergy    Medications: I have reviewed the  patient's current medications.  No results found for this or any previous visit (from the past 48 hour(s)).  Mr Cervical Spine W/o Contrast  Addendum Date: 03/09/2019   ADDENDUM REPORT: 03/09/2019 14:58 ADDENDUM: Critical Value/emergent results were called by telephone at the time of interpretation on 03/09/2019 at 2:55 pm to Dr. Rodell Perna , who verbally acknowledged these results. Electronically Signed   By: Richardean Sale M.D.   On: 03/09/2019 14:58   Result Date: 03/09/2019 CLINICAL DATA:  Bilateral extremity weakness with inability to ambulate. History of prostate cancer with osseous metastatic disease. Bowel changes. EXAM: MRI CERVICAL AND THORACIC SPINE WITHOUT CONTRAST TECHNIQUE: Multiplanar and multiecho pulse sequences of the cervical spine, to include the cervicothoracic junction, and the thoracic spine, were obtained without intravenous contrast. COMPARISON:  MR lumbar spine 01/26/2019. Abdominopelvic CT 11/17/2018. FINDINGS: MRI CERVICAL SPINE FINDINGS Alignment: Straightening without focal angulation or listhesis. Vertebrae: No evidence of acute fracture. Probable ankylosis across the C2-3 disc and facet joints. There is a small lesion posteriorly in the C3 vertebral body which is best seen on image 5/3, likely a metastasis. No other definite metastases within the cervical spine. Cord: Normal in signal and caliber. Posterior Fossa, vertebral arteries, paraspinal tissues: The craniocervical junction appears normal. There are bilateral vertebral artery flow voids. No significant paraspinal findings. Disc levels: C2-3: As above, probable ankylosis across the vertebral body and facet  joints. The axial images do not extend across this disc space level. C3-4: Spondylosis with disc bulging, uncinate spurring and facet hypertrophy. Mild spinal stenosis with mild to moderate foraminal narrowing bilaterally. No cord deformity. C4-5: Spondylosis with uncinate spurring and facet hypertrophy. Mild spinal  stenosis with mild to moderate foraminal narrowing bilaterally. No cord deformity. C5-6: Spondylosis with uncinate spurring and facet hypertrophy. No cord deformity. Mild to moderate foraminal narrowing bilaterally. C6-7: Spondylosis with posterior osteophytes and bilateral uncinate spurring. No cord deformity. Mild to moderate foraminal narrowing bilaterally. C7-T1: Spondylosis with posterior osteophytes contributing to severe foraminal narrowing bilaterally. No cord deformity. MRI THORACIC SPINE FINDINGS Alignment:  Normal. Vertebrae: There is widespread osseous metastatic disease within the midthoracic spine with involvement of all the vertebral bodies from T4 through T10. There is also involvement of the posterior elements at T4 and T8. There is a mild pathologic fracture at T4 with osseous retropulsion. The right T8 pedicle and lamina appear expanded with possible adjacent epidural tumor. Cord: There is cord compression, most notably at the T4 and T8 levels. Mild cord T2 hyperintensity is present at those levels. No definite cord hemorrhage. The conus medullaris extends to the upper L2 level. Paraspinal and other soft tissues: There is paraspinous edema in the midthoracic spine and small bilateral pleural effusions. Disc levels: No significant disc space findings at T1-2, T2-3 or T3-4. T4-5: No significant disc pathology. As above, there is a T4 metastasis with a mild pathologic fracture and possible ventral epidural tumor. There is mild cord flattening. T5-6: No significant findings. T6-7: There is osseous metastatic disease within the T6 and T7 vertebral bodies with prominence of the posterior epidural fat. The CSF surrounding the cord is effaced without apparent focal epidural tumor at this level. T7-8: There is osseous metastatic disease with posterior epidural tumor, especially within the right T8 pedicle. There is resulting mild cord compression and cord T2 hyperintensity. T8-9: The CSF surrounding the  cord is effaced without focal epidural tumor. Mild degenerative changes are present within the lower thoracic spine with disc bulging at T9-10 and L1-2. No cord deformity. IMPRESSION: 1. Multifocal osseous metastatic disease in the midthoracic spine extending from T4 through T10. Mild pathologic fracture at T4. 2. Cord compression at the T4 and T8 levels. The CSF surrounding the cord is largely effaced from T4 through T9. 3. Probable solitary metastasis within the C3 vertebral body. No evidence of cord compression in the cervical spine. 4. Multilevel cervical spondylosis. Electronically Signed: By: Richardean Sale M.D. On: 03/09/2019 14:51   Mr Thoracic Spine W/o Contrast  Addendum Date: 03/09/2019   ADDENDUM REPORT: 03/09/2019 14:58 ADDENDUM: Critical Value/emergent results were called by telephone at the time of interpretation on 03/09/2019 at 2:55 pm to Dr. Rodell Perna , who verbally acknowledged these results. Electronically Signed   By: Richardean Sale M.D.   On: 03/09/2019 14:58   Result Date: 03/09/2019 CLINICAL DATA:  Bilateral extremity weakness with inability to ambulate. History of prostate cancer with osseous metastatic disease. Bowel changes. EXAM: MRI CERVICAL AND THORACIC SPINE WITHOUT CONTRAST TECHNIQUE: Multiplanar and multiecho pulse sequences of the cervical spine, to include the cervicothoracic junction, and the thoracic spine, were obtained without intravenous contrast. COMPARISON:  MR lumbar spine 01/26/2019. Abdominopelvic CT 11/17/2018. FINDINGS: MRI CERVICAL SPINE FINDINGS Alignment: Straightening without focal angulation or listhesis. Vertebrae: No evidence of acute fracture. Probable ankylosis across the C2-3 disc and facet joints. There is a small lesion posteriorly in the C3 vertebral body which is best  seen on image 5/3, likely a metastasis. No other definite metastases within the cervical spine. Cord: Normal in signal and caliber. Posterior Fossa, vertebral arteries, paraspinal  tissues: The craniocervical junction appears normal. There are bilateral vertebral artery flow voids. No significant paraspinal findings. Disc levels: C2-3: As above, probable ankylosis across the vertebral body and facet joints. The axial images do not extend across this disc space level. C3-4: Spondylosis with disc bulging, uncinate spurring and facet hypertrophy. Mild spinal stenosis with mild to moderate foraminal narrowing bilaterally. No cord deformity. C4-5: Spondylosis with uncinate spurring and facet hypertrophy. Mild spinal stenosis with mild to moderate foraminal narrowing bilaterally. No cord deformity. C5-6: Spondylosis with uncinate spurring and facet hypertrophy. No cord deformity. Mild to moderate foraminal narrowing bilaterally. C6-7: Spondylosis with posterior osteophytes and bilateral uncinate spurring. No cord deformity. Mild to moderate foraminal narrowing bilaterally. C7-T1: Spondylosis with posterior osteophytes contributing to severe foraminal narrowing bilaterally. No cord deformity. MRI THORACIC SPINE FINDINGS Alignment:  Normal. Vertebrae: There is widespread osseous metastatic disease within the midthoracic spine with involvement of all the vertebral bodies from T4 through T10. There is also involvement of the posterior elements at T4 and T8. There is a mild pathologic fracture at T4 with osseous retropulsion. The right T8 pedicle and lamina appear expanded with possible adjacent epidural tumor. Cord: There is cord compression, most notably at the T4 and T8 levels. Mild cord T2 hyperintensity is present at those levels. No definite cord hemorrhage. The conus medullaris extends to the upper L2 level. Paraspinal and other soft tissues: There is paraspinous edema in the midthoracic spine and small bilateral pleural effusions. Disc levels: No significant disc space findings at T1-2, T2-3 or T3-4. T4-5: No significant disc pathology. As above, there is a T4 metastasis with a mild pathologic  fracture and possible ventral epidural tumor. There is mild cord flattening. T5-6: No significant findings. T6-7: There is osseous metastatic disease within the T6 and T7 vertebral bodies with prominence of the posterior epidural fat. The CSF surrounding the cord is effaced without apparent focal epidural tumor at this level. T7-8: There is osseous metastatic disease with posterior epidural tumor, especially within the right T8 pedicle. There is resulting mild cord compression and cord T2 hyperintensity. T8-9: The CSF surrounding the cord is effaced without focal epidural tumor. Mild degenerative changes are present within the lower thoracic spine with disc bulging at T9-10 and L1-2. No cord deformity. IMPRESSION: 1. Multifocal osseous metastatic disease in the midthoracic spine extending from T4 through T10. Mild pathologic fracture at T4. 2. Cord compression at the T4 and T8 levels. The CSF surrounding the cord is largely effaced from T4 through T9. 3. Probable solitary metastasis within the C3 vertebral body. No evidence of cord compression in the cervical spine. 4. Multilevel cervical spondylosis. Electronically Signed: By: Richardean Sale M.D. On: 03/09/2019 14:51    Pertinent items noted in HPI and remainder of comprehensive ROS otherwise negative. Blood pressure (!) 144/90, pulse 83, temperature 98.8 F (37.1 C), temperature source Oral, resp. rate 16, height 6' (1.829 m), weight 84.8 kg, SpO2 99 %. Patient is awake and alert.  He is oriented and appropriate.  His speech is fluent.  His judgment and insight are intact.  His cranial nerve function is normal bilateral.  Motor examination is upper extremities is 5/5.  Motor examination of his lower extremities is 3/5 in his hip flexors knee extensors and plantar flexors bilaterally.  He has 2/5 strength in his dorsiflexors bilaterally.  He has a relative sensory level around T8.  Reflexes are normal active in both upper extremities.  He has brisk in both  lower extremities.  Toes are upgoing to plantar stimulation.  Examination head ears eyes nose throat is unremarked.  Chest and abdomen are benign except patient has an indwelling Port-A-Cath in his right anterior chest wall.  Extremities are free from injury or deformity.  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  I discussed situation with the patient.  Although he is on maximum medical therapy I think he would benefit from decompression and resection of his thoracic epidural tumor followed by adjunctive radiation treatment to his thoracic spine.  I discussed the risks involved with T7, T8-T9 decompressive laminectomy with resection of epidural tumor including but not limited to the risk of anesthesia, bleeding, infection, CSF leak, nerve root injury, spinal cord injury, later instability, continued pain, and non-benefit.  Patient is also aware of the risk of tumor recurrence and the need for further treatment.  He has been given the opportunity ask questions.  He appears to understand.  He wishes to proceed with surgery emergently.  Cooper Render Bethanee Redondo 03/09/2019, 6:49 PM

## 2019-03-09 NOTE — ED Provider Notes (Signed)
[facility] EMERGENCY DEPARTMENT Provider Note   CSN: [id number] Arrival date & time: 03/09/19  1527     History   Chief Complaint Chief Complaint  Patient presents with   sent by PCP    imaging showed tumor on spine    HPI Dennis Fischer is a 73 y.o. male.  He has a history of prostate cancer with mets to spine.  He has had progressive weakness of his lower extremities and has not really walked well for about a month.  He just had some thoracic and lumbar MRIs and was referred here by [name] but unfortunately went to [facility].  He is now been transferred over here and is anticipated to be evaluated by Dr. Annette Stable.  He denies any pain and has had no fever cough shortness of breath.  He said he had a bunch of falls which ultimately got him evaluated for his lower extremity weakness.     The history is provided by the patient.  Extremity Weakness This is a new problem. The current episode started more than 1 week ago. The problem occurs constantly. The problem has been gradually worsening. Pertinent negatives include no chest pain, no abdominal pain, no headaches and no shortness of breath. Nothing aggravates the symptoms. Nothing relieves the symptoms. He has tried nothing for the symptoms. The treatment provided no relief.    Past Medical History:  Diagnosis Date   Hypertension    Metastatic cancer to bone ([place]) dx'd 2018   Prostate cancer [facility])     Patient Active Problem List   Diagnosis Date Noted   Prostate cancer metastatic to bone ([place]) 12/22/2018   Port-A-Cath in place 07/02/2018   Goals of care, counseling/discussion 01/20/2018   Right knee pain 01/17/2018   Prostate cancer ([place]) 01/22/2017   Prediabetes 03/30/2016   Positive FIT (fecal immunochemical test) 11/22/2015   GERD (gastroesophageal reflux disease) 12/21/2014   Age-related nuclear cataract of both eyes 01/13/2013   Hypertensive retinopathy 01/13/2013   Essential  (primary) hypertension 11/25/2012   Pure hypercholesterolemia 11/25/2012   History of total hip replacement 08/28/2010   Tobacco dependence syndrome 03/22/2006    Past Surgical History:  Procedure Laterality Date   HIP SURGERY Bilateral    IR IMAGING GUIDED PORT INSERTION  04/22/2018        Home Medications    Prior to Admission medications   Medication Sig Start Date End Date Taking? Authorizing Provider  abiraterone acetate (ZYTIGA) 250 MG tablet Take by mouth. 04/20/16   [provider]  acetaminophen (TYLENOL) 500 MG tablet Take by mouth. 11/22/15   [provider]  amLODipine (NORVASC) 10 MG tablet TAKE 1 TABLET(10 MG) BY MOUTH DAILY 02/23/19   [name], MD  aspirin 81 MG tablet Take 81 mg by mouth daily. 07/10/16   [provider]  calcium-vitamin D (OSCAL WITH D) 500-200 MG-UNIT tablet Take 1 tablet by mouth 2 (two) times daily. 04/15/18   Wyatt Portela, MD  carvedilol (COREG) 12.5 MG tablet TAKE 1 TABLET BY MOUTH TWICE DAILY WITH A MEAL 04/25/18   [name], MD  furosemide (LASIX) 20 MG tablet TAKE 1 TABLET(20 MG) BY MOUTH DAILY 12/25/18   Wyatt Portela, MD  gabapentin (NEURONTIN) 300 MG capsule Take 1 capsule (300 mg total) by mouth at bedtime. 01/27/19   Tanner, Lyndon Code., PA-C  lidocaine-prilocaine (EMLA) cream Apply 1 application topically as needed. 04/15/18   Wyatt Portela, MD  Multiple Vitamin (MULTIVITAMIN) tablet  Take 1 tablet by mouth daily. 07/10/16   [provider]  NON FORMULARY Trimix 9 12/14/15   [provider]  omeprazole (PRILOSEC) 20 MG capsule TAKE 1 CAPSULE(20 MG) BY MOUTH DAILY 02/23/19   [name], MD  oxybutynin (DITROPAN-XL) 10 MG 24 hr tablet TAKE 1 TABLET(10 MG) BY MOUTH DAILY 04/09/16   [provider]  potassium chloride SA (K-DUR,KLOR-CON) 20 MEQ tablet TAKE 1 TABLET(20 MEQ) BY MOUTH DAILY 06/09/18   Wyatt Portela, MD  predniSONE (DELTASONE) 5 MG tablet Take by mouth. 04/20/16   [provider]  prochlorperazine (COMPAZINE) 10 MG tablet TAKE 1 TABLET BY MOUTH EVERY 6 HOURS AS NEEDED FOR NAUSEA OR VOMITING 06/11/18   Wyatt Portela, MD  sildenafil (VIAGRA) 100 MG tablet Take 1 tablet (100 mg total) by mouth daily as needed for erectile dysfunction. 05/15/18   [name], MD  simvastatin (ZOCOR) 20 MG tablet TAKE 1 TABLET BY MOUTH EVERY DAY 05/26/18   [name], MD  Vitamin D, Ergocalciferol, (DRISDOL) 1.25 MG (50000 UT) CAPS capsule TAKE 1 CAPSULE BY MOUTH 1 TIME A WEEK 04/13/16   [provider]    Family History Family History  Problem Relation Age of Onset   Prostate cancer Father     Social History Social History   Tobacco Use   Smoking status: Former Smoker    Packs/day: 0.25    Years: 50.00    Pack years: 12.50    Types: Cigarettes    Quit date: 09/10/2014    Years since quitting: 4.4   Smokeless tobacco: Never Used   Tobacco comment: Reports smoking only when he drank.  Substance Use Topics   Alcohol use: Yes    Comment: Once every 2 weeks.    Drug use: Yes    Types: Marijuana    Comment: Medical      Allergies   Lisinopril, Cat hair extract, Mangifera indica, Mango flavor, Other, Peanut-containing drug products, Pistachio nut (diagnostic), and Sunflower oil   Review of Systems Review of Systems  Constitutional: Negative for fever.  HENT: Negative for sore throat.   Eyes: Negative for visual disturbance.  Respiratory: Negative for shortness of breath.   Cardiovascular: Negative for chest pain.  Gastrointestinal: Negative for abdominal pain.  Genitourinary: Negative for dysuria.  Musculoskeletal: Positive for extremity weakness and gait problem.  Skin: Negative for rash.  Neurological: Positive for weakness. Negative for headaches.     Physical Exam Updated Vital Signs BP (!) 144/90    Pulse 83    Temp 98.8 F (37.1 C) (Oral)    Resp 16    Ht 6' (1.829 m)    Wt 84.8 kg    SpO2 99%    BMI 25.36 kg/m   Physical  Exam Vitals signs and nursing note reviewed.  Constitutional:      Appearance: He is well-developed.  HENT:     Head: Normocephalic and atraumatic.  Eyes:     Conjunctiva/sclera: Conjunctivae normal.  Neck:     Musculoskeletal: Neck supple.  Cardiovascular:     Rate and Rhythm: Normal rate and regular rhythm.     Heart sounds: No murmur.  Pulmonary:     Effort: Pulmonary effort is normal. No respiratory distress.     Breath sounds: Normal breath sounds.  Abdominal:     Palpations: Abdomen is soft.     Tenderness: There is no abdominal tenderness.  Musculoskeletal:     Right lower leg: No edema.  Left lower leg: No edema.  Skin:    General: Skin is warm and dry.     Capillary Refill: Capillary refill takes less than 2 seconds.  Neurological:     Mental Status: He is alert.     Comments: Patient is awake and alert.  He has full use of his upper extremities with no appreciable weakness.  He is a lot of weakness with hip flexor and any extension or flexion in his ankles.      ED Treatments / Results  Labs (all labs ordered are listed, but only abnormal results are displayed) Labs Reviewed  IRON AND TIBC - Abnormal; Notable for the following components:      Result Value   Saturation Ratios 16 (*)    All other components within normal limits  SARS CORONAVIRUS 2 (HOSPITAL ORDER, [facility])  SARS CORONAVIRUS 2 ([facility])  PROTIME-INR  FERRITIN  CBC WITH DIFFERENTIAL/PLATELET  COMPREHENSIVE METABOLIC PANEL  BASIC METABOLIC PANEL  CBC  TYPE AND SCREEN  ABO/RH  SURGICAL PATHOLOGY    EKG EKG Interpretation  Date/Time:  Monday March 09 2019 18:47:43 EDT Ventricular Rate:  78 PR Interval:    QRS Duration: 92 QT Interval:  551 QTC Calculation: 628 R Axis:   37 Text Interpretation:  Sinus rhythm Low voltage, extremity leads Nonspecific T abnormalities, lateral leads Prolonged QT interval no prior to compare  with Confirmed by Aletta Edouard [phone]) on 03/09/2019 7:03:29 PM Also confirmed by Aletta Edouard [phone]), editor Philomena Doheny [phone])  on 03/10/2019 7:08:57 AM   Radiology Mr Cervical Spine W/o Contrast  Addendum Date: 03/09/2019   ADDENDUM REPORT: 03/09/2019 14:58 ADDENDUM: Critical Value/emergent results were called by telephone at the time of interpretation on 03/09/2019 at 2:55 pm to Dr. Rodell Perna , who verbally acknowledged these results. Electronically Signed   By: Richardean Sale M.D.   On: 03/09/2019 14:58   Result Date: 03/09/2019 CLINICAL DATA:  Bilateral extremity weakness with inability to ambulate. History of prostate cancer with osseous metastatic disease. Bowel changes. EXAM: MRI CERVICAL AND THORACIC SPINE WITHOUT CONTRAST TECHNIQUE: Multiplanar and multiecho pulse sequences of the cervical spine, to include the cervicothoracic junction, and the thoracic spine, were obtained without intravenous contrast. COMPARISON:  MR lumbar spine 01/26/2019. Abdominopelvic CT 11/17/2018. FINDINGS: MRI CERVICAL SPINE FINDINGS Alignment: Straightening without focal angulation or listhesis. Vertebrae: No evidence of acute fracture. Probable ankylosis across the C2-3 disc and facet joints. There is a small lesion posteriorly in the C3 vertebral body which is best seen on image 5/3, likely a metastasis. No other definite metastases within the cervical spine. Cord: Normal in signal and caliber. Posterior Fossa, vertebral arteries, paraspinal tissues: The craniocervical junction appears normal. There are bilateral vertebral artery flow voids. No significant paraspinal findings. Disc levels: C2-3: As above, probable ankylosis across the vertebral body and facet joints. The axial images do not extend across this disc space level. C3-4: Spondylosis with disc bulging, uncinate spurring and facet hypertrophy. Mild spinal stenosis with mild to moderate foraminal narrowing bilaterally. No cord deformity. C4-5:  Spondylosis with uncinate spurring and facet hypertrophy. Mild spinal stenosis with mild to moderate foraminal narrowing bilaterally. No cord deformity. C5-6: Spondylosis with uncinate spurring and facet hypertrophy. No cord deformity. Mild to moderate foraminal narrowing bilaterally. C6-7: Spondylosis with posterior osteophytes and bilateral uncinate spurring. No cord deformity. Mild to moderate foraminal narrowing bilaterally. C7-T1: Spondylosis with posterior osteophytes contributing to severe foraminal narrowing bilaterally. No  cord deformity. MRI THORACIC SPINE FINDINGS Alignment:  Normal. Vertebrae: There is widespread osseous metastatic disease within the midthoracic spine with involvement of all the vertebral bodies from T4 through T10. There is also involvement of the posterior elements at T4 and T8. There is a mild pathologic fracture at T4 with osseous retropulsion. The right T8 pedicle and lamina appear expanded with possible adjacent epidural tumor. Cord: There is cord compression, most notably at the T4 and T8 levels. Mild cord T2 hyperintensity is present at those levels. No definite cord hemorrhage. The conus medullaris extends to the upper L2 level. Paraspinal and other soft tissues: There is paraspinous edema in the midthoracic spine and small bilateral pleural effusions. Disc levels: No significant disc space findings at T1-2, T2-3 or T3-4. T4-5: No significant disc pathology. As above, there is a T4 metastasis with a mild pathologic fracture and possible ventral epidural tumor. There is mild cord flattening. T5-6: No significant findings. T6-7: There is osseous metastatic disease within the T6 and T7 vertebral bodies with prominence of the posterior epidural fat. The CSF surrounding the cord is effaced without apparent focal epidural tumor at this level. T7-8: There is osseous metastatic disease with posterior epidural tumor, especially within the right T8 pedicle. There is resulting mild cord  compression and cord T2 hyperintensity. T8-9: The CSF surrounding the cord is effaced without focal epidural tumor. Mild degenerative changes are present within the lower thoracic spine with disc bulging at T9-10 and L1-2. No cord deformity. IMPRESSION: 1. Multifocal osseous metastatic disease in the midthoracic spine extending from T4 through T10. Mild pathologic fracture at T4. 2. Cord compression at the T4 and T8 levels. The CSF surrounding the cord is largely effaced from T4 through T9. 3. Probable solitary metastasis within the C3 vertebral body. No evidence of cord compression in the cervical spine. 4. Multilevel cervical spondylosis. Electronically Signed: By: Richardean Sale M.D. On: 03/09/2019 14:51   Mr Thoracic Spine W/o Contrast  Addendum Date: 03/09/2019   ADDENDUM REPORT: 03/09/2019 14:58 ADDENDUM: Critical Value/emergent results were called by telephone at the time of interpretation on 03/09/2019 at 2:55 pm to Dr. Rodell Perna , who verbally acknowledged these results. Electronically Signed   By: Richardean Sale M.D.   On: 03/09/2019 14:58   Result Date: 03/09/2019 CLINICAL DATA:  Bilateral extremity weakness with inability to ambulate. History of prostate cancer with osseous metastatic disease. Bowel changes. EXAM: MRI CERVICAL AND THORACIC SPINE WITHOUT CONTRAST TECHNIQUE: Multiplanar and multiecho pulse sequences of the cervical spine, to include the cervicothoracic junction, and the thoracic spine, were obtained without intravenous contrast. COMPARISON:  MR lumbar spine 01/26/2019. Abdominopelvic CT 11/17/2018. FINDINGS: MRI CERVICAL SPINE FINDINGS Alignment: Straightening without focal angulation or listhesis. Vertebrae: No evidence of acute fracture. Probable ankylosis across the C2-3 disc and facet joints. There is a small lesion posteriorly in the C3 vertebral body which is best seen on image 5/3, likely a metastasis. No other definite metastases within the cervical spine. Cord: Normal in  signal and caliber. Posterior Fossa, vertebral arteries, paraspinal tissues: The craniocervical junction appears normal. There are bilateral vertebral artery flow voids. No significant paraspinal findings. Disc levels: C2-3: As above, probable ankylosis across the vertebral body and facet joints. The axial images do not extend across this disc space level. C3-4: Spondylosis with disc bulging, uncinate spurring and facet hypertrophy. Mild spinal stenosis with mild to moderate foraminal narrowing bilaterally. No cord deformity. C4-5: Spondylosis with uncinate spurring and facet hypertrophy. Mild spinal stenosis with  mild to moderate foraminal narrowing bilaterally. No cord deformity. C5-6: Spondylosis with uncinate spurring and facet hypertrophy. No cord deformity. Mild to moderate foraminal narrowing bilaterally. C6-7: Spondylosis with posterior osteophytes and bilateral uncinate spurring. No cord deformity. Mild to moderate foraminal narrowing bilaterally. C7-T1: Spondylosis with posterior osteophytes contributing to severe foraminal narrowing bilaterally. No cord deformity. MRI THORACIC SPINE FINDINGS Alignment:  Normal. Vertebrae: There is widespread osseous metastatic disease within the midthoracic spine with involvement of all the vertebral bodies from T4 through T10. There is also involvement of the posterior elements at T4 and T8. There is a mild pathologic fracture at T4 with osseous retropulsion. The right T8 pedicle and lamina appear expanded with possible adjacent epidural tumor. Cord: There is cord compression, most notably at the T4 and T8 levels. Mild cord T2 hyperintensity is present at those levels. No definite cord hemorrhage. The conus medullaris extends to the upper L2 level. Paraspinal and other soft tissues: There is paraspinous edema in the midthoracic spine and small bilateral pleural effusions. Disc levels: No significant disc space findings at T1-2, T2-3 or T3-4. T4-5: No significant disc  pathology. As above, there is a T4 metastasis with a mild pathologic fracture and possible ventral epidural tumor. There is mild cord flattening. T5-6: No significant findings. T6-7: There is osseous metastatic disease within the T6 and T7 vertebral bodies with prominence of the posterior epidural fat. The CSF surrounding the cord is effaced without apparent focal epidural tumor at this level. T7-8: There is osseous metastatic disease with posterior epidural tumor, especially within the right T8 pedicle. There is resulting mild cord compression and cord T2 hyperintensity. T8-9: The CSF surrounding the cord is effaced without focal epidural tumor. Mild degenerative changes are present within the lower thoracic spine with disc bulging at T9-10 and L1-2. No cord deformity. IMPRESSION: 1. Multifocal osseous metastatic disease in the midthoracic spine extending from T4 through T10. Mild pathologic fracture at T4. 2. Cord compression at the T4 and T8 levels. The CSF surrounding the cord is largely effaced from T4 through T9. 3. Probable solitary metastasis within the C3 vertebral body. No evidence of cord compression in the cervical spine. 4. Multilevel cervical spondylosis. Electronically Signed: By: Richardean Sale M.D. On: 03/09/2019 14:51    Procedures Procedures (including critical care time)  Medications Ordered in ED Medications - No data to display   Initial Impression / Assessment and Plan / ED Course  I have reviewed the triage vital signs and the nursing notes.  Pertinent labs & imaging results that were available during my care of the patient were reviewed by me and considered in my medical decision making (see chart for details).  Clinical Course as of Mar 09 906  Mon Mar 08, 6265  [id number] 73 year old male with prostate cancer here with metastatic lesions to the spine that are compromising his motor function.  Please to call into neurosurgery.  He will likely need emergent decompression.  Have  ordered preop labs and EKG.   [MB]  1903 He was able to see Dr. Trenton Gammon as he was leaving the patient's room.  He said the patient needs to go to the operating room as soon as possible for decompression of his thoracic lesion.  He asked if he can start some steroids get preop labs and try to get him admitted to a medical team.   [MB]  1913 I reached out to the family practice team who said they would check in with Dr. Trenton Gammon and make  sure he felt like he was going to be able to come out to the floor not need ICU care after his surgery.   [MB]    Clinical Course User Index [MB] Hayden Rasmussen, MD   Dennis Fischer was evaluated in Emergency Department on 03/09/2019 for the symptoms described in the history of present illness. He was evaluated in the context of the global COVID-19 pandemic, which necessitated consideration that the patient might be at risk for infection with the SARS-CoV-2 virus that causes COVID-19. Institutional protocols and algorithms that pertain to the evaluation of patients at risk for COVID-19 are in a state of rapid change based on information released by regulatory bodies including the CDC and federal and state organizations. These policies and algorithms were followed during the patient's care in the ED.     Final Clinical Impressions(s) / ED Diagnoses   Final diagnoses:  Cord compression myelopathy [facility])  Prostate cancer metastatic to bone [facility])    ED Discharge Orders    None       Hayden Rasmussen, MD 03/10/19 [phone]

## 2019-03-09 NOTE — ED Triage Notes (Signed)
Pt had imaging today and showed a tumor on his spine and was called by MD to go to ED. Pt been having weakness in legs for about a month. Pt has prostate cancer.

## 2019-03-09 NOTE — ED Notes (Signed)
Bed: WA04 Expected date:  Expected time:  Means of arrival:  Comments: Hold triage 1

## 2019-03-09 NOTE — Brief Op Note (Signed)
03/09/2019  11:27 PM  PATIENT:  Dennis Fischer  73 y.o. male  PRE-OPERATIVE DIAGNOSIS:  Thoracic Epidural Tumor  POST-OPERATIVE DIAGNOSIS:  Thoracic Epidural Tumor  PROCEDURE:  Procedure(s): THORACIC SEVEN - THORACIC EIGHT - THORACIC NINE LAMINECTOMY WITH RESECTION OF EPIDURAL TUMOR (N/A)  SURGEON:  Surgeon(s) and Role:    * Earnie Larsson, MD - Primary  PHYSICIAN ASSISTANT:   ASSISTANTSMearl Latin   ANESTHESIA:   general  EBL:  350 mL   BLOOD ADMINISTERED:none  DRAINS: (med) Hemovact drain(s) in the epidural space with  Suction Open   LOCAL MEDICATIONS USED:  MARCAINE     SPECIMEN:  Source of Specimen:  thoracic epidural space T8  DISPOSITION OF SPECIMEN:  PATHOLOGY  COUNTS:  YES  TOURNIQUET:  * No tourniquets in log *  DICTATION: .Dragon Dictation  PLAN OF CARE: Admit to inpatient   PATIENT DISPOSITION:  PACU - hemodynamically stable.   Delay start of Pharmacological VTE agent (>24hrs) due to surgical blood loss or risk of bleeding: yes

## 2019-03-09 NOTE — Telephone Encounter (Signed)
Received vm message from patient requesting to clarify appts he has for 03/10/19.  Spoke with patient, reviewed his appts. He was able to repeat back to me appts and he also states he wrote them down. No further questions or concerns.

## 2019-03-09 NOTE — ED Notes (Signed)
Attempted to call report to receiving facility.   

## 2019-03-09 NOTE — Anesthesia Preprocedure Evaluation (Signed)
Anesthesia Evaluation  Patient identified by MRN, date of birth, ID band Patient awake    Reviewed: Allergy & Precautions, NPO status , Patient's Chart, lab work & pertinent test results  Airway Mallampati: II  TM Distance: >3 FB     Dental  (+) Dental Advisory Given   Pulmonary former smoker,    breath sounds clear to auscultation       Cardiovascular hypertension, Pt. on medications and Pt. on home beta blockers  Rhythm:Regular Rate:Normal     Neuro/Psych Metastatic prostate CA with neurologic compromise     GI/Hepatic Neg liver ROS, GERD  ,  Endo/Other  negative endocrine ROS  Renal/GU negative Renal ROS     Musculoskeletal   Abdominal   Peds  Hematology negative hematology ROS (+)   Anesthesia Other Findings   Reproductive/Obstetrics                             Lab Results  Component Value Date   WBC 4.1 02/17/2019   HGB 12.0 (L) 02/17/2019   HCT 36.9 (L) 02/17/2019   MCV 86.0 02/17/2019   PLT 245 02/17/2019   Lab Results  Component Value Date   CREATININE 1.11 01/13/2019   BUN 17 01/13/2019   NA 141 01/13/2019   K 4.0 01/13/2019   CL 108 01/13/2019   CO2 23 01/13/2019    Anesthesia Physical Anesthesia Plan  ASA: III and emergent  Anesthesia Plan: General   Post-op Pain Management:    Induction: Intravenous  PONV Risk Score and Plan: 2 and Dexamethasone, Ondansetron and Treatment may vary due to age or medical condition  Airway Management Planned: Oral ETT  Additional Equipment:   Intra-op Plan:   Post-operative Plan: Extubation in OR  Informed Consent: I have reviewed the patients History and Physical, chart, labs and discussed the procedure including the risks, benefits and alternatives for the proposed anesthesia with the patient or authorized representative who has indicated his/her understanding and acceptance.     Dental advisory given  Plan  Discussed with: CRNA  Anesthesia Plan Comments:         Anesthesia Quick Evaluation

## 2019-03-09 NOTE — ED Notes (Signed)
Report called to Receiving ED charge RN.

## 2019-03-09 NOTE — Telephone Encounter (Signed)
MRI results and admission.

## 2019-03-09 NOTE — Telephone Encounter (Signed)
Urgent results called the patient and wife and they just got home after the MRI scan.  I told them to take patient to [facility] for admission and Dr. Earnie Larsson would see him in the emergency room.  Unfortunately wife took him to [facility] long emergency room.  Called [facility] ER spoke with Dr. Roderic Palau will get patient from [place] to [facility].  I will send him to [facility] ER were Dr. Annette Stable can be paged and he can see the patient for likely surgery thoracic decompression this evening.

## 2019-03-09 NOTE — Anesthesia Procedure Notes (Signed)
Procedure Name: Intubation Date/Time: 03/09/2019 10:01 PM Performed by: Clovis Cao, CRNA Pre-anesthesia Checklist: Patient identified, Emergency Drugs available, Suction available, Patient being monitored and Timeout performed Patient Re-evaluated:Patient Re-evaluated prior to induction Oxygen Delivery Method: Circle system utilized Preoxygenation: Pre-oxygenation with 100% oxygen Induction Type: IV induction, Rapid sequence and Cricoid Pressure applied Laryngoscope Size: Miller and 2 Grade View: Grade I Tube type: Oral Tube size: 7.5 mm Number of attempts: 1 Airway Equipment and Method: Stylet Placement Confirmation: ETT inserted through vocal cords under direct vision,  positive ETCO2 and breath sounds checked- equal and bilateral Secured at: 22 cm Tube secured with: Tape Dental Injury: Teeth and Oropharynx as per pre-operative assessment

## 2019-03-09 NOTE — ED Provider Notes (Addendum)
[facility] DEPT Provider Note   CSN: [id number] Arrival date & time: 03/09/19  1527     History   Chief Complaint Chief Complaint  Patient presents with  . sent by PCP    imaging showed tumor on spine    HPI Dennis Fischer is a 73 y.o. male.     Patient complains of lower back pain and weakness in his legs.  He has prostate cancer with metastatic disease.  He had an MRI that shows cord compression.  He is scheduled to have surgery to his back by Dr. Dierdre Harness [facility].  He accidentally ended up at the emergency department at [facility] long.  We will transfer him to [facility], ED and he will be seen by Dr. Trenton Gammon  The history is provided by the patient. No language interpreter was used.  Back Pain Location:  Thoracic spine Quality:  Aching Radiates to:  Does not radiate Pain severity:  Moderate Pain is:  Worse during the day Onset quality:  Sudden Timing:  Constant Progression:  Worsening Chronicity:  New Context: not emotional stress   Worsened by:  Nothing Associated symptoms: no abdominal pain, no chest pain and no headaches     Past Medical History:  Diagnosis Date  . Hypertension   . Metastatic cancer to bone ([place]) dx'd 2018  . Prostate cancer [facility])     Patient Active Problem List   Diagnosis Date Noted  . Prostate cancer metastatic to bone ([place]) 12/22/2018  . Port-A-Cath in place 07/02/2018  . Goals of care, counseling/discussion 01/20/2018  . Right knee pain 01/17/2018  . Prostate cancer ([place]) 01/22/2017  . Prediabetes 03/30/2016  . Positive FIT (fecal immunochemical test) 11/22/2015  . GERD (gastroesophageal reflux disease) 12/21/2014  . Age-related nuclear cataract of both eyes 01/13/2013  . Hypertensive retinopathy 01/13/2013  . Essential (primary) hypertension 11/25/2012  . Pure hypercholesterolemia 11/25/2012  . History of total hip replacement 08/28/2010  . Tobacco dependence syndrome 03/22/2006     Past Surgical History:  Procedure Laterality Date  . HIP SURGERY Bilateral   . IR IMAGING GUIDED PORT INSERTION  04/22/2018        Home Medications    Prior to Admission medications   Medication Sig Start Date End Date Taking? Authorizing Provider  abiraterone acetate (ZYTIGA) 250 MG tablet Take by mouth. 04/20/16   [provider]  acetaminophen (TYLENOL) 500 MG tablet Take by mouth. 11/22/15   [provider]  amLODipine (NORVASC) 10 MG tablet TAKE 1 TABLET(10 MG) BY MOUTH DAILY 02/23/19   [name], MD  aspirin 81 MG tablet Take 81 mg by mouth daily. 07/10/16   [provider]  calcium-vitamin D (OSCAL WITH D) 500-200 MG-UNIT tablet Take 1 tablet by mouth 2 (two) times daily. 04/15/18   Wyatt Portela, MD  carvedilol (COREG) 12.5 MG tablet TAKE 1 TABLET BY MOUTH TWICE DAILY WITH A MEAL 04/25/18   [name], MD  furosemide (LASIX) 20 MG tablet TAKE 1 TABLET(20 MG) BY MOUTH DAILY 12/25/18   Wyatt Portela, MD  gabapentin (NEURONTIN) 300 MG capsule Take 1 capsule (300 mg total) by mouth at bedtime. 01/27/19   Tanner, Lyndon Code., PA-C  lidocaine-prilocaine (EMLA) cream Apply 1 application topically as needed. 04/15/18   Wyatt Portela, MD  Multiple Vitamin (MULTIVITAMIN) tablet Take 1 tablet by mouth daily. 07/10/16   [provider]  NON FORMULARY Trimix 9 12/14/15   [provider]  omeprazole ([place])  20 MG capsule TAKE 1 CAPSULE(20 MG) BY MOUTH DAILY 02/23/19   [name], MD  oxybutynin (DITROPAN-XL) 10 MG 24 hr tablet TAKE 1 TABLET(10 MG) BY MOUTH DAILY 04/09/16   [provider]  potassium chloride SA (K-DUR,KLOR-CON) 20 MEQ tablet TAKE 1 TABLET(20 MEQ) BY MOUTH DAILY 06/09/18   Wyatt Portela, MD  predniSONE (DELTASONE) 5 MG tablet Take by mouth. 04/20/16   [provider]  prochlorperazine (COMPAZINE) 10 MG tablet TAKE 1 TABLET BY MOUTH EVERY 6 HOURS AS NEEDED FOR NAUSEA OR VOMITING 06/11/18   Wyatt Portela, MD  sildenafil  (VIAGRA) 100 MG tablet Take 1 tablet (100 mg total) by mouth daily as needed for erectile dysfunction. 05/15/18   [name], MD  simvastatin (ZOCOR) 20 MG tablet TAKE 1 TABLET BY MOUTH EVERY DAY 05/26/18   [name], MD  Vitamin D, Ergocalciferol, (DRISDOL) 1.25 MG (50000 UT) CAPS capsule TAKE 1 CAPSULE BY MOUTH 1 TIME A WEEK 04/13/16   [provider]    Family History Family History  Problem Relation Age of Onset  . Prostate cancer Father     Social History Social History   Tobacco Use  . Smoking status: Former Smoker    Packs/day: 0.25    Years: 50.00    Pack years: 12.50    Types: Cigarettes    Quit date: 09/10/2014    Years since quitting: 4.4  . Smokeless tobacco: Never Used  . Tobacco comment: Reports smoking only when he drank.  Substance Use Topics  . Alcohol use: Yes    Comment: Once every 2 weeks.   . Drug use: Yes    Types: Marijuana    Comment: Medical      Allergies   Lisinopril, Cat hair extract, Mangifera indica, Mango flavor, Other, Peanut-containing drug products, Pistachio nut (diagnostic), and Sunflower oil   Review of Systems Review of Systems  Constitutional: Negative for appetite change and fatigue.  HENT: Negative for congestion, ear discharge and sinus pressure.   Eyes: Negative for discharge.  Respiratory: Negative for cough.   Cardiovascular: Negative for chest pain.  Gastrointestinal: Negative for abdominal pain and diarrhea.  Genitourinary: Negative for frequency and hematuria.  Musculoskeletal: Positive for back pain.  Skin: Negative for rash.  Neurological: Negative for seizures and headaches.  Psychiatric/Behavioral: Negative for hallucinations.     Physical Exam Updated Vital Signs BP 133/82 (BP Location: Left Arm)   Pulse 82   Temp 98.8 F (37.1 C) (Oral)   Resp 16   SpO2 99%   Physical Exam Vitals signs and nursing note reviewed.  Constitutional:      Appearance: He is well-developed.  HENT:     Head:  Normocephalic.     Mouth/Throat:     Mouth: Mucous membranes are moist.  Eyes:     General: No scleral icterus.    Conjunctiva/sclera: Conjunctivae normal.  Neck:     Musculoskeletal: Neck supple.     Thyroid: No thyromegaly.  Cardiovascular:     Rate and Rhythm: Normal rate and regular rhythm.     Heart sounds: No murmur. No friction rub. No gallop.   Pulmonary:     Breath sounds: No stridor. No wheezing or rales.  Chest:     Chest wall: No tenderness.  Abdominal:     General: There is no distension.     Tenderness: There is no abdominal tenderness. There is no rebound.  Musculoskeletal: Normal range of motion.     Comments: Lumbar  spine and thoracic spine tenderness.  Patient with significant weakness to lower legs  Lymphadenopathy:     Cervical: No cervical adenopathy.  Skin:    Findings: No erythema or rash.  Neurological:     Mental Status: He is oriented to person, place, and time.     Motor: No abnormal muscle tone.     Coordination: Coordination normal.  Psychiatric:        Behavior: Behavior normal.      ED Treatments / Results  Labs (all labs ordered are listed, but only abnormal results are displayed) Labs Reviewed - No data to display  EKG    Radiology Mr Cervical Spine W/o Contrast  Addendum Date: 03/09/2019   ADDENDUM REPORT: 03/09/2019 14:58 ADDENDUM: Critical Value/emergent results were called by telephone at the time of interpretation on 03/09/2019 at 2:55 pm to Dr. Rodell Perna , who verbally acknowledged these results. Electronically Signed   By: Richardean Sale M.D.   On: 03/09/2019 14:58   Result Date: 03/09/2019 CLINICAL DATA:  Bilateral extremity weakness with inability to ambulate. History of prostate cancer with osseous metastatic disease. Bowel changes. EXAM: MRI CERVICAL AND THORACIC SPINE WITHOUT CONTRAST TECHNIQUE: Multiplanar and multiecho pulse sequences of the cervical spine, to include the cervicothoracic junction, and the thoracic spine,  were obtained without intravenous contrast. COMPARISON:  MR lumbar spine 01/26/2019. Abdominopelvic CT 11/17/2018. FINDINGS: MRI CERVICAL SPINE FINDINGS Alignment: Straightening without focal angulation or listhesis. Vertebrae: No evidence of acute fracture. Probable ankylosis across the C2-3 disc and facet joints. There is a small lesion posteriorly in the C3 vertebral body which is best seen on image 5/3, likely a metastasis. No other definite metastases within the cervical spine. Cord: Normal in signal and caliber. Posterior Fossa, vertebral arteries, paraspinal tissues: The craniocervical junction appears normal. There are bilateral vertebral artery flow voids. No significant paraspinal findings. Disc levels: C2-3: As above, probable ankylosis across the vertebral body and facet joints. The axial images do not extend across this disc space level. C3-4: Spondylosis with disc bulging, uncinate spurring and facet hypertrophy. Mild spinal stenosis with mild to moderate foraminal narrowing bilaterally. No cord deformity. C4-5: Spondylosis with uncinate spurring and facet hypertrophy. Mild spinal stenosis with mild to moderate foraminal narrowing bilaterally. No cord deformity. C5-6: Spondylosis with uncinate spurring and facet hypertrophy. No cord deformity. Mild to moderate foraminal narrowing bilaterally. C6-7: Spondylosis with posterior osteophytes and bilateral uncinate spurring. No cord deformity. Mild to moderate foraminal narrowing bilaterally. C7-T1: Spondylosis with posterior osteophytes contributing to severe foraminal narrowing bilaterally. No cord deformity. MRI THORACIC SPINE FINDINGS Alignment:  Normal. Vertebrae: There is widespread osseous metastatic disease within the midthoracic spine with involvement of all the vertebral bodies from T4 through T10. There is also involvement of the posterior elements at T4 and T8. There is a mild pathologic fracture at T4 with osseous retropulsion. The right T8  pedicle and lamina appear expanded with possible adjacent epidural tumor. Cord: There is cord compression, most notably at the T4 and T8 levels. Mild cord T2 hyperintensity is present at those levels. No definite cord hemorrhage. The conus medullaris extends to the upper L2 level. Paraspinal and other soft tissues: There is paraspinous edema in the midthoracic spine and small bilateral pleural effusions. Disc levels: No significant disc space findings at T1-2, T2-3 or T3-4. T4-5: No significant disc pathology. As above, there is a T4 metastasis with a mild pathologic fracture and possible ventral epidural tumor. There is mild cord flattening. T5-6: No significant findings.  T6-7: There is osseous metastatic disease within the T6 and T7 vertebral bodies with prominence of the posterior epidural fat. The CSF surrounding the cord is effaced without apparent focal epidural tumor at this level. T7-8: There is osseous metastatic disease with posterior epidural tumor, especially within the right T8 pedicle. There is resulting mild cord compression and cord T2 hyperintensity. T8-9: The CSF surrounding the cord is effaced without focal epidural tumor. Mild degenerative changes are present within the lower thoracic spine with disc bulging at T9-10 and L1-2. No cord deformity. IMPRESSION: 1. Multifocal osseous metastatic disease in the midthoracic spine extending from T4 through T10. Mild pathologic fracture at T4. 2. Cord compression at the T4 and T8 levels. The CSF surrounding the cord is largely effaced from T4 through T9. 3. Probable solitary metastasis within the C3 vertebral body. No evidence of cord compression in the cervical spine. 4. Multilevel cervical spondylosis. Electronically Signed: By: Richardean Sale M.D. On: 03/09/2019 14:51   Mr Thoracic Spine W/o Contrast  Addendum Date: 03/09/2019   ADDENDUM REPORT: 03/09/2019 14:58 ADDENDUM: Critical Value/emergent results were called by telephone at the time of  interpretation on 03/09/2019 at 2:55 pm to Dr. Rodell Perna , who verbally acknowledged these results. Electronically Signed   By: Richardean Sale M.D.   On: 03/09/2019 14:58   Result Date: 03/09/2019 CLINICAL DATA:  Bilateral extremity weakness with inability to ambulate. History of prostate cancer with osseous metastatic disease. Bowel changes. EXAM: MRI CERVICAL AND THORACIC SPINE WITHOUT CONTRAST TECHNIQUE: Multiplanar and multiecho pulse sequences of the cervical spine, to include the cervicothoracic junction, and the thoracic spine, were obtained without intravenous contrast. COMPARISON:  MR lumbar spine 01/26/2019. Abdominopelvic CT 11/17/2018. FINDINGS: MRI CERVICAL SPINE FINDINGS Alignment: Straightening without focal angulation or listhesis. Vertebrae: No evidence of acute fracture. Probable ankylosis across the C2-3 disc and facet joints. There is a small lesion posteriorly in the C3 vertebral body which is best seen on image 5/3, likely a metastasis. No other definite metastases within the cervical spine. Cord: Normal in signal and caliber. Posterior Fossa, vertebral arteries, paraspinal tissues: The craniocervical junction appears normal. There are bilateral vertebral artery flow voids. No significant paraspinal findings. Disc levels: C2-3: As above, probable ankylosis across the vertebral body and facet joints. The axial images do not extend across this disc space level. C3-4: Spondylosis with disc bulging, uncinate spurring and facet hypertrophy. Mild spinal stenosis with mild to moderate foraminal narrowing bilaterally. No cord deformity. C4-5: Spondylosis with uncinate spurring and facet hypertrophy. Mild spinal stenosis with mild to moderate foraminal narrowing bilaterally. No cord deformity. C5-6: Spondylosis with uncinate spurring and facet hypertrophy. No cord deformity. Mild to moderate foraminal narrowing bilaterally. C6-7: Spondylosis with posterior osteophytes and bilateral uncinate spurring.  No cord deformity. Mild to moderate foraminal narrowing bilaterally. C7-T1: Spondylosis with posterior osteophytes contributing to severe foraminal narrowing bilaterally. No cord deformity. MRI THORACIC SPINE FINDINGS Alignment:  Normal. Vertebrae: There is widespread osseous metastatic disease within the midthoracic spine with involvement of all the vertebral bodies from T4 through T10. There is also involvement of the posterior elements at T4 and T8. There is a mild pathologic fracture at T4 with osseous retropulsion. The right T8 pedicle and lamina appear expanded with possible adjacent epidural tumor. Cord: There is cord compression, most notably at the T4 and T8 levels. Mild cord T2 hyperintensity is present at those levels. No definite cord hemorrhage. The conus medullaris extends to the upper L2 level. Paraspinal and other soft tissues:  There is paraspinous edema in the midthoracic spine and small bilateral pleural effusions. Disc levels: No significant disc space findings at T1-2, T2-3 or T3-4. T4-5: No significant disc pathology. As above, there is a T4 metastasis with a mild pathologic fracture and possible ventral epidural tumor. There is mild cord flattening. T5-6: No significant findings. T6-7: There is osseous metastatic disease within the T6 and T7 vertebral bodies with prominence of the posterior epidural fat. The CSF surrounding the cord is effaced without apparent focal epidural tumor at this level. T7-8: There is osseous metastatic disease with posterior epidural tumor, especially within the right T8 pedicle. There is resulting mild cord compression and cord T2 hyperintensity. T8-9: The CSF surrounding the cord is effaced without focal epidural tumor. Mild degenerative changes are present within the lower thoracic spine with disc bulging at T9-10 and L1-2. No cord deformity. IMPRESSION: 1. Multifocal osseous metastatic disease in the midthoracic spine extending from T4 through T10. Mild pathologic  fracture at T4. 2. Cord compression at the T4 and T8 levels. The CSF surrounding the cord is largely effaced from T4 through T9. 3. Probable solitary metastasis within the C3 vertebral body. No evidence of cord compression in the cervical spine. 4. Multilevel cervical spondylosis. Electronically Signed: By: Richardean Sale M.D. On: 03/09/2019 14:51    Procedures Procedures (including critical care time)  Medications Ordered in ED Medications - No data to display   Initial Impression / Assessment and Plan / ED Course  I have reviewed the triage vital signs and the nursing notes.  Pertinent labs & imaging results that were available during my care of the patient were reviewed by me and considered in my medical decision making (see chart for details).  Clinical Course as of Mar 09 1322  Mon Mar 09, 7363  [id number] 73 year old male with prostate cancer here with metastatic lesions to the spine that are compromising his motor function.  Please to call into neurosurgery.  He will likely need emergent decompression.  Have ordered preop labs and EKG.   [MB]  1903 He was able to see Dr. Trenton Gammon as he was leaving the patient's room.  He said the patient needs to go to the operating room as soon as possible for decompression of his thoracic lesion.  He asked if he can start some steroids get preop labs and try to get him admitted to a medical team.   [MB]  1913 I reached out to the family practice team who said they would check in with Dr. Trenton Gammon and make sure he felt like he was going to be able to come out to the floor not need ICU care after his surgery.   [MB]    Clinical Course User Index [MB] Hayden Rasmussen, MD       Patient will be transferred to [facility] emergency department for Dr. Trenton Gammon to see and take to the OR Final Clinical Impressions(s) / ED Diagnoses   Final diagnoses:  None    ED Discharge Orders    None       Milton Ferguson, MD 03/09/19 1701    Milton Ferguson, MD  03/10/19 1324

## 2019-03-09 NOTE — Transfer of Care (Signed)
Immediate Anesthesia Transfer of Care Note  Patient: Dennis Fischer  Procedure(s) Performed: THORACIC SEVEN - THORACIC EIGHT - THORACIC NINE LAMINECTOMY WITH RESECTION OF EPIDURAL TUMOR (N/A Back)  Patient Location: PACU  Anesthesia Type:General  Level of Consciousness: awake  Airway & Oxygen Therapy: Patient Spontanous Breathing and Patient connected to nasal cannula oxygen  Post-op Assessment: Report given to RN and Post -op Vital signs reviewed and stable  Post vital signs: Reviewed and stable  Last Vitals:  Vitals Value Taken Time  BP 186/108 03/09/19 2336  Temp    Pulse 93 03/09/19 2338  Resp 13 03/09/19 2338  SpO2 100 % 03/09/19 2338  Vitals shown include unvalidated device data.  Last Pain:  Vitals:   03/09/19 1754  TempSrc:   PainSc: 0-No pain         Complications: No apparent anesthesia complications

## 2019-03-09 NOTE — Op Note (Signed)
Date of procedure: 03/09/2019  Date of dictation: Same  Service: Neurosurgery  Preoperative diagnosis: Metastatic prostate cancer to spine with epidural extension and progressive myelopathy  Postoperative diagnosis: Same  Procedure Name: T7-T8-T9 decompressive laminectomy with resection of epidural tumor, microdissection  Surgeon:Jaina Morin A.Karianne Nogueira, M.D.  Asst. Surgeon: Reinaldo Meeker, NP  Anesthesia: General  Indication: 73 year old male with known metastatic prostate cancer presents with worsening bilateral lower extremity weakness.  Work-up demonstrates evidence of metastatic disease to his thoracic spine with severe stenosis and cord compression at the T8 level.  Patient presents now for emergent decompressive surgery.  Operative note: After induction anesthesia, patient position prone onto bolsters and was appropriately padded.  Thoracic region prepped and draped sterilely.  Incision made overlying T8.  Dissection performed bilaterally.  Retractors placed.  Multiple x-rays taken.  There is some confusion regarding spinal levels given his transitional anatomy at his lumbosacral spine.  The marker was placed to what I think was T8 although it may have been T9.  I decided to proceed with the decompression at this point.  I performed a decompressive laminectomy of T7-T8 and T9 assuming the marker was on the T9 level.  The tumor itself was mostly confined to the bone.  There was some epidural tumor which was taken as specimen.  The cord was completely decompressed.  There is no evidence of injury to thecal sac and nerve roots.  Microscope was used for microdissection.  Gelfoam and Surgifoam was used for hemostasis.  A medium Hemovac drain was left in the epidural space.  Wounds and closed in layers of Vicryl sutures.  Steri-Strips and sterile dressing were applied.  No apparent complications.  Patient tolerated the procedure well and he returns to the recovery room postop.

## 2019-03-09 NOTE — ED Triage Notes (Signed)
Pt transferred here from [facility]. Pt here for a spinal cord compression injury and here for surgery.

## 2019-03-09 NOTE — ED Notes (Signed)
ED TO INPATIENT HANDOFF REPORT  ED Nurse Name and Phone #: Sherrine Maples [phone]  S Name/Age/Gender Harrell Gave 73 y.o. male Room/Bed: 028C/028C  Code Status   Code Status: Not on file  Home/SNF/Other Home Patient oriented to: self, place, time and situation Is this baseline? Yes   Triage Complete: Triage complete  Chief Complaint Sent by dr  Triage Note Pt had imaging today and showed a tumor on his spine and was called by MD to go to ED. Pt been having weakness in legs for about a month. Pt has prostate cancer.   Pt transferred here from [place] [facility]. Pt here for a spinal cord compression injury and here for surgery.    Allergies Allergies  Allergen Reactions  . Lisinopril Swelling    Facial swelling  . Cat Hair Extract     eyes swell  . Mangifera Indica     MANGO --  . Mango Flavor     throat swells with mango  . Other   . Peanut-Containing Drug Products   . Pistachio Nut (Diagnostic)     hands and feet burn/itch  . Sunflower Oil     Sunflower seed allergy    Level of Care/Admitting Diagnosis ED Disposition    ED Disposition Condition [facility] Area: [place] [[id number]]  Level of Care: Telemetry Medical [104]  Covid Evaluation: Screening Protocol (No Symptoms)  Diagnosis: Spinal cord compression [facility]) [[id number]]  Admitting Physician: Matilde Haymaker [[id number]]  Attending Physician: Leeanne Rio [phone]  Estimated length of stay: past midnight tomorrow  Certification:: I certify this patient will need inpatient services for at least 2 midnights  PT Class (Do Not Modify): Inpatient [101]  PT Acc Code (Do Not Modify): Private [1]       B Medical/Surgery History Past Medical History:  Diagnosis Date  . Hypertension   . Metastatic cancer to bone ([place]) dx'd 2018  . Prostate cancer [facility])    Past Surgical History:  Procedure Laterality Date  . HIP SURGERY Bilateral   . IR IMAGING GUIDED PORT INSERTION  04/22/2018      A IV Location/Drains/Wounds Patient Lines/Drains/Airways Status   Active Line/Drains/Airways    Name:   Placement date:   Placement time:   Site:   Days:   Implanted Port 04/22/18 Left Chest   04/22/18    -    Chest   321   Peripheral IV 11/17/18 Posterior;Right Hand   11/17/18    0802    Hand   112          Intake/Output Last 24 hours No intake or output data in the 24 hours ending 03/09/19 2039  Labs/Imaging Results for orders placed or performed during the hospital encounter of 03/09/19 (from the past 48 hour(s))  Protime-INR     Status: None   Collection Time: 03/09/19  6:40 PM  Result Value Ref Range   Prothrombin Time 13.8 11.4 - 15.2 seconds   INR 1.1 0.8 - 1.2    Comment: (NOTE) INR goal varies based on device and disease states. Performed at [facility], [place] [address], [place],  [address]   Type and screen [place]     Status: None   Collection Time: 03/09/19  6:51 PM  Result Value Ref Range   ABO/RH(D) O POS    Antibody Screen NEG    Sample Expiration      03/12/2019,2359 Performed at [facility],  [address], [place], [place] [address]   ABO/Rh     Status: None (Preliminary result)   Collection Time: 03/09/19  6:51 PM  Result Value Ref Range   ABO/RH(D)      O POS Performed at [facility] [address] [place], [place] [address]    Mr Cervical Spine W/o Contrast  Addendum Date: 03/09/2019   ADDENDUM REPORT: 03/09/2019 14:58 ADDENDUM: Critical Value/emergent results were called by telephone at the time of interpretation on 03/09/2019 at 2:55 pm to Dr. Rodell Perna , who verbally acknowledged these results. Electronically Signed   By: Richardean Sale M.D.   On: 03/09/2019 14:58   Result Date: 03/09/2019 CLINICAL DATA:  Bilateral extremity weakness with inability to ambulate. History of prostate cancer with osseous metastatic disease. Bowel changes. EXAM: MRI CERVICAL AND THORACIC SPINE WITHOUT CONTRAST  TECHNIQUE: Multiplanar and multiecho pulse sequences of the cervical spine, to include the cervicothoracic junction, and the thoracic spine, were obtained without intravenous contrast. COMPARISON:  MR lumbar spine 01/26/2019. Abdominopelvic CT 11/17/2018. FINDINGS: MRI CERVICAL SPINE FINDINGS Alignment: Straightening without focal angulation or listhesis. Vertebrae: No evidence of acute fracture. Probable ankylosis across the C2-3 disc and facet joints. There is a small lesion posteriorly in the C3 vertebral body which is best seen on image 5/3, likely a metastasis. No other definite metastases within the cervical spine. Cord: Normal in signal and caliber. Posterior Fossa, vertebral arteries, paraspinal tissues: The craniocervical junction appears normal. There are bilateral vertebral artery flow voids. No significant paraspinal findings. Disc levels: C2-3: As above, probable ankylosis across the vertebral body and facet joints. The axial images do not extend across this disc space level. C3-4: Spondylosis with disc bulging, uncinate spurring and facet hypertrophy. Mild spinal stenosis with mild to moderate foraminal narrowing bilaterally. No cord deformity. C4-5: Spondylosis with uncinate spurring and facet hypertrophy. Mild spinal stenosis with mild to moderate foraminal narrowing bilaterally. No cord deformity. C5-6: Spondylosis with uncinate spurring and facet hypertrophy. No cord deformity. Mild to moderate foraminal narrowing bilaterally. C6-7: Spondylosis with posterior osteophytes and bilateral uncinate spurring. No cord deformity. Mild to moderate foraminal narrowing bilaterally. C7-T1: Spondylosis with posterior osteophytes contributing to severe foraminal narrowing bilaterally. No cord deformity. MRI THORACIC SPINE FINDINGS Alignment:  Normal. Vertebrae: There is widespread osseous metastatic disease within the midthoracic spine with involvement of all the vertebral bodies from T4 through T10. There is  also involvement of the posterior elements at T4 and T8. There is a mild pathologic fracture at T4 with osseous retropulsion. The right T8 pedicle and lamina appear expanded with possible adjacent epidural tumor. Cord: There is cord compression, most notably at the T4 and T8 levels. Mild cord T2 hyperintensity is present at those levels. No definite cord hemorrhage. The conus medullaris extends to the upper L2 level. Paraspinal and other soft tissues: There is paraspinous edema in the midthoracic spine and small bilateral pleural effusions. Disc levels: No significant disc space findings at T1-2, T2-3 or T3-4. T4-5: No significant disc pathology. As above, there is a T4 metastasis with a mild pathologic fracture and possible ventral epidural tumor. There is mild cord flattening. T5-6: No significant findings. T6-7: There is osseous metastatic disease within the T6 and T7 vertebral bodies with prominence of the posterior epidural fat. The CSF surrounding the cord is effaced without apparent focal epidural tumor at this level. T7-8: There is osseous metastatic disease with posterior epidural tumor, especially within the right T8 pedicle. There is resulting mild  cord compression and cord T2 hyperintensity. T8-9: The CSF surrounding the cord is effaced without focal epidural tumor. Mild degenerative changes are present within the lower thoracic spine with disc bulging at T9-10 and L1-2. No cord deformity. IMPRESSION: 1. Multifocal osseous metastatic disease in the midthoracic spine extending from T4 through T10. Mild pathologic fracture at T4. 2. Cord compression at the T4 and T8 levels. The CSF surrounding the cord is largely effaced from T4 through T9. 3. Probable solitary metastasis within the C3 vertebral body. No evidence of cord compression in the cervical spine. 4. Multilevel cervical spondylosis. Electronically Signed: By: Richardean Sale M.D. On: 03/09/2019 14:51   Mr Thoracic Spine W/o Contrast  Addendum  Date: 03/09/2019   ADDENDUM REPORT: 03/09/2019 14:58 ADDENDUM: Critical Value/emergent results were called by telephone at the time of interpretation on 03/09/2019 at 2:55 pm to Dr. Rodell Perna , who verbally acknowledged these results. Electronically Signed   By: Richardean Sale M.D.   On: 03/09/2019 14:58   Result Date: 03/09/2019 CLINICAL DATA:  Bilateral extremity weakness with inability to ambulate. History of prostate cancer with osseous metastatic disease. Bowel changes. EXAM: MRI CERVICAL AND THORACIC SPINE WITHOUT CONTRAST TECHNIQUE: Multiplanar and multiecho pulse sequences of the cervical spine, to include the cervicothoracic junction, and the thoracic spine, were obtained without intravenous contrast. COMPARISON:  MR lumbar spine 01/26/2019. Abdominopelvic CT 11/17/2018. FINDINGS: MRI CERVICAL SPINE FINDINGS Alignment: Straightening without focal angulation or listhesis. Vertebrae: No evidence of acute fracture. Probable ankylosis across the C2-3 disc and facet joints. There is a small lesion posteriorly in the C3 vertebral body which is best seen on image 5/3, likely a metastasis. No other definite metastases within the cervical spine. Cord: Normal in signal and caliber. Posterior Fossa, vertebral arteries, paraspinal tissues: The craniocervical junction appears normal. There are bilateral vertebral artery flow voids. No significant paraspinal findings. Disc levels: C2-3: As above, probable ankylosis across the vertebral body and facet joints. The axial images do not extend across this disc space level. C3-4: Spondylosis with disc bulging, uncinate spurring and facet hypertrophy. Mild spinal stenosis with mild to moderate foraminal narrowing bilaterally. No cord deformity. C4-5: Spondylosis with uncinate spurring and facet hypertrophy. Mild spinal stenosis with mild to moderate foraminal narrowing bilaterally. No cord deformity. C5-6: Spondylosis with uncinate spurring and facet hypertrophy. No cord  deformity. Mild to moderate foraminal narrowing bilaterally. C6-7: Spondylosis with posterior osteophytes and bilateral uncinate spurring. No cord deformity. Mild to moderate foraminal narrowing bilaterally. C7-T1: Spondylosis with posterior osteophytes contributing to severe foraminal narrowing bilaterally. No cord deformity. MRI THORACIC SPINE FINDINGS Alignment:  Normal. Vertebrae: There is widespread osseous metastatic disease within the midthoracic spine with involvement of all the vertebral bodies from T4 through T10. There is also involvement of the posterior elements at T4 and T8. There is a mild pathologic fracture at T4 with osseous retropulsion. The right T8 pedicle and lamina appear expanded with possible adjacent epidural tumor. Cord: There is cord compression, most notably at the T4 and T8 levels. Mild cord T2 hyperintensity is present at those levels. No definite cord hemorrhage. The conus medullaris extends to the upper L2 level. Paraspinal and other soft tissues: There is paraspinous edema in the midthoracic spine and small bilateral pleural effusions. Disc levels: No significant disc space findings at T1-2, T2-3 or T3-4. T4-5: No significant disc pathology. As above, there is a T4 metastasis with a mild pathologic fracture and possible ventral epidural tumor. There is mild cord flattening. T5-6: No significant  findings. T6-7: There is osseous metastatic disease within the T6 and T7 vertebral bodies with prominence of the posterior epidural fat. The CSF surrounding the cord is effaced without apparent focal epidural tumor at this level. T7-8: There is osseous metastatic disease with posterior epidural tumor, especially within the right T8 pedicle. There is resulting mild cord compression and cord T2 hyperintensity. T8-9: The CSF surrounding the cord is effaced without focal epidural tumor. Mild degenerative changes are present within the lower thoracic spine with disc bulging at T9-10 and L1-2. No  cord deformity. IMPRESSION: 1. Multifocal osseous metastatic disease in the midthoracic spine extending from T4 through T10. Mild pathologic fracture at T4. 2. Cord compression at the T4 and T8 levels. The CSF surrounding the cord is largely effaced from T4 through T9. 3. Probable solitary metastasis within the C3 vertebral body. No evidence of cord compression in the cervical spine. 4. Multilevel cervical spondylosis. Electronically Signed: By: Richardean Sale M.D. On: 03/09/2019 14:51    Pending Labs Unresulted Labs (From admission, onward)    Start     Ordered   03/09/19 1857  SARS Coronavirus 2 (Performed in [facility] hospital lab)  ([place])  Once,   STAT    Question:  Pre-procedural testing  Answer:  Yes   03/09/19 1856   03/09/19 1820  SARS Coronavirus 2 (CEPHEID - Performed in [place] hospital lab), [place]  (Asymptomatic Patients Labs)  Once,   STAT    Question:  Rule Out  Answer:  Yes   03/09/19 1819   03/09/19 1705  CBC with Differential/Platelet  Once,   STAT     03/09/19 1704   03/09/19 1705  Comprehensive metabolic panel  ONCE - STAT,   STAT     03/09/19 1704   Signed and Held  Basic metabolic panel  Tomorrow morning,   R     Signed and Held   Signed and Held  CBC  Tomorrow morning,   R     Signed and Held          Vitals/Pain Today's Vitals   03/09/19 1700 03/09/19 1730 03/09/19 1743 03/09/19 1754  BP: (!) 144/90     Pulse: 78 83    Resp:      Temp:      TempSrc:      SpO2: 99% 99%    Weight:    84.8 kg  Height:    6' (1.829 m)  PainSc:   0-No pain 0-No pain    Isolation Precautions No active isolations  Medications Medications  ceFAZolin (ANCEF) IVPB 2g/100 mL premix (has no administration in time range)  dexamethasone (DECADRON) injection 10 mg (10 mg Intravenous Given 03/09/19 2022)  sodium chloride 0.9 % bolus 1,000 mL (1,000 mLs Intravenous New Bag/Given 03/09/19 2022)  propofol (DIPRIVAN) 10 mg/mL bolus/IV push (has no administration in  time range)  midazolam (VERSED) 2 MG/2ML injection (has no administration in time range)  fentaNYL (SUBLIMAZE) 250 MCG/5ML injection (has no administration in time range)  bupivacaine (PF) (MARCAINE) 0.25 % injection (has no administration in time range)  thrombin 20000 units spray (has no administration in time range)    Mobility walks with device Low fall risk   Focused Assessments    R Recommendations: See Admitting Provider Note  Report given to:   Additional Notes:

## 2019-03-09 NOTE — Telephone Encounter (Signed)
Called by Dr. Lin Landsman with MRI results showing spondylosis in the cervical spine and multifocal metastatic disease extending from T4-T8 10 with cord compression at T4 and T8.  Reviewed images with Dr. Earnie Larsson who agreed to see him for decompression surgery likely tonight.  I called patient and his wife told him to come to [facility] emergency room for admission and Dr. Trenton Gammon would be called.  I called triage nurse at [facility] and left message.  Unfortunately wife took him to [facility].  I called [facility] spoke with Dr. Roderic Palau will arrange transfer promptly so patient can get to [facility] and when he arrives they can page Dr. Deri Fuelling so he can see the patient and discuss urgent decompression surgery before patient gets worse cord compression and loses more motor function.

## 2019-03-09 NOTE — H&P (Addendum)
[facility] Admission History and Physical Service Pager: [phone]  Patient name: Dennis [facility] Medical record number: [id number] Date of birth: 07-22-1946 Age: 73 y.o. Gender: male  Primary Care Provider: [name], MD Consultants: neurosurgery Code Status: Full  Chief Complaint: Lower extremity weakness  Assessment and Plan: Dennis [facility] is a 73 y.o. male presenting with lower extremity weakness for about 2 months.  His past medical history is remarkable for metastatic prostate cancer(Dx 2015), GERD, prediabetes, HLD.  Worsening lower extremity weakness/numbness likely secondary to spinal cord compression from metastatic prostate cancer He reports worsening weakness/numbness over a period of months with no acute changes.  His vitals are within normal limits on admission.  His physical exam was notable for 4/5 strength for hip flexion and reduce strength for dorsal and plantar flexion with no sensation to light touch in his lower extremities.  MRI thoracic spine showed evidence of multifocal bone mets from T4-T10 in addition to evidence of spinal cord compression at the level of T4 and T8.  Dennis [facility] initially presented to [facility] for being redirected to [facility].  In the [facility], ED, he was assessed by neurosurgery who recommended urgent spinal cord decompression.  Family medicine team was notified for post surgical care. -Admit to [place], attending Dr. Ardelia Mems -Neurosurgery following, appreciate recommendations -Consult oncology 6/30 -N.p.o. -Hold ASA -Follow-up neurosurgery recommendations regarding PT/OT -A.m. CBC/BMP -post-op pain regimen per neurosurgery - f/u surgical pathology  Prostate cancer, stage IV has been following for several years with oncology and has a port in his right chest for his current chemotherapeutic treatment.  We will plan to follow-up with Dr. Inda Merlin post surgery for further recommendations.  Neurosurgery  mention the possibility of further radiation therapy to the spine. -Follow-up with oncology regarding further treatment -Holding Zytiga in the setting of surgery  Anemia, normocytic CBC from 6/9 shows a hemoglobin of 12.0 with an MCV of 86.  He reports occasional bright red blood per rectum with bowel movements.  We will follow-up with iron panel in the morning. -Follow-up iron studies -Follow-up FOBT - am cbc  History of heart failure? Dennis [facility] denies any history of heart failure.  There is no documentation of an echocardiogram in his chart.  His home medication historically included carvedilol, Lasix, potassium supplementation.  He now only takes carvedilol.  He appears euvolemic on exam. -Continue carvedilol 12.5 mg twice daily - consider echo if becomes volume overloaded  Urinary incontinence/bladder spasms Home medication includes oxybutynin 10 mg daily. - continue home oxybutynin 10mg   History of headaches He reports taking gabapentin 300 mg at night for headaches. -Gabapentin 300 mg nightly  GERD Home medication includes omeprazole 20 mg daily. -Pantoprazole 40 mg p.o. per formulary  HLD No lipid panel in chart. Consider lipid panel as outpatient -Continue simvastatin 20 mg daily  FEN/GI: NPO Prophylaxis: SCDs  Disposition: pending neurosurgery and onc recs  History of Present Illness:  Dennis [facility] is a 73 y.o. male presenting with lower extremity weakness for about 2 months.  His past medical history is remarkable for metastatic prostate cancer, GERD, prediabetes, HLD.  He reports that he first noticed lower extremity weakness in April 2020, around the time he began his most recent trial of chemotherapy.  He reports that his lower extremity weakness has been increasing slowly since that time.  Several months ago, he could walk unassisted he has progressed to using a cane and then a walker in order to be mobile.  He  now struggles to walk at all.  On 5/18 an MRI  lumbar spine was performed to assess for spinal metastases.  Oncology, Dennis [facility], ordered reimaging of the thoracic and cervical spine to further assess for spinal cord compression.  Dennis [facility] contacted Dennis [facility] later in the day on 6/29 to form he should immediately go to the emergency department to be assessed for urgent spinal surgery.  In the ED, he was assessed by neurosurgery with the plan to perform spinal cord decompression later in the evening.  Review Of Systems: Per HPI with the following additions:   Review of Systems  Constitutional: Negative for fever, malaise/fatigue and weight loss.  HENT: Negative for congestion, hearing loss and sore throat.   Eyes: Negative for blurred vision.  Respiratory: Negative for cough, shortness of breath and wheezing.   Cardiovascular: Negative for chest pain, palpitations and leg swelling.  Gastrointestinal: Positive for abdominal pain (band like discomfort) and blood in stool. Negative for nausea and vomiting.  Genitourinary: Negative for dysuria.  Musculoskeletal: Negative for myalgias.  Neurological: Positive for sensory change, focal weakness (LE weakness/numbness sub acute) and weakness. Negative for tremors.    Patient Active Problem List   Diagnosis Date Noted  . Spinal cord compression ([place]) 03/09/2019  . Prostate cancer metastatic to bone ([place]) 12/22/2018  . Port-A-Cath in place 07/02/2018  . Goals of care, counseling/discussion 01/20/2018  . Right knee pain 01/17/2018  . Prostate cancer ([place]) 01/22/2017  . Prediabetes 03/30/2016  . Positive FIT (fecal immunochemical test) 11/22/2015  . GERD (gastroesophageal reflux disease) 12/21/2014  . Age-related nuclear cataract of both eyes 01/13/2013  . Hypertensive retinopathy 01/13/2013  . Essential (primary) hypertension 11/25/2012  . Pure hypercholesterolemia 11/25/2012  . History of total hip replacement 08/28/2010  . Tobacco dependence syndrome 03/22/2006    Past Medical  History: Past Medical History:  Diagnosis Date  . Hypertension   . Metastatic cancer to bone ([place]) dx'd 2018  . Prostate cancer [facility])     Past Surgical History: Past Surgical History:  Procedure Laterality Date  . HIP SURGERY Bilateral   . IR IMAGING GUIDED PORT INSERTION  04/22/2018    Social History: Social History   Tobacco Use  . Smoking status: Former Smoker    Packs/day: 0.25    Years: 50.00    Pack years: 12.50    Types: Cigarettes    Quit date: 09/10/2014    Years since quitting: 4.4  . Smokeless tobacco: Never Used  . Tobacco comment: Reports smoking only when he drank.  Substance Use Topics  . Alcohol use: Yes    Comment: Once every 2 weeks.   . Drug use: Yes    Types: Marijuana    Comment: Medical    Additional social history: lives with wife  Please also refer to relevant sections of EMR.  Family History: Family History  Problem Relation Age of Onset  . Prostate cancer Father     Allergies and Medications: Allergies  Allergen Reactions  . Lisinopril Swelling    Facial swelling  . Cat Hair Extract     eyes swell  . Mangifera Indica     MANGO --  . Mango Flavor     throat swells with mango  . Other   . Peanut-Containing Drug Products   . Pistachio Nut (Diagnostic)     hands and feet burn/itch  . Sunflower Oil     Sunflower seed allergy   No current facility-administered medications on file prior to encounter.  Current Outpatient Medications on File Prior to Encounter  Medication Sig Dispense Refill  . abiraterone acetate (ZYTIGA) 250 MG tablet Take by mouth.    Marland Kitchen acetaminophen (TYLENOL) 500 MG tablet Take by mouth.    Marland Kitchen amLODipine (NORVASC) 10 MG tablet TAKE 1 TABLET(10 MG) BY MOUTH DAILY 90 tablet 3  . aspirin 81 MG tablet Take 81 mg by mouth daily.    . calcium-vitamin D (OSCAL WITH D) 500-200 MG-UNIT tablet Take 1 tablet by mouth 2 (two) times daily. 60 tablet 3  . carvedilol (COREG) 12.5 MG tablet TAKE 1 TABLET BY MOUTH TWICE DAILY  WITH A MEAL 180 tablet 3  . furosemide (LASIX) 20 MG tablet TAKE 1 TABLET(20 MG) BY MOUTH DAILY 90 tablet 0  . gabapentin (NEURONTIN) 300 MG capsule Take 1 capsule (300 mg total) by mouth at bedtime. 30 capsule 5  . lidocaine-prilocaine (EMLA) cream Apply 1 application topically as needed. 30 g 0  . Multiple Vitamin (MULTIVITAMIN) tablet Take 1 tablet by mouth daily.    . NON FORMULARY Trimix 9    . omeprazole (PRILOSEC) 20 MG capsule TAKE 1 CAPSULE(20 MG) BY MOUTH DAILY 90 capsule 0  . oxybutynin (DITROPAN-XL) 10 MG 24 hr tablet TAKE 1 TABLET(10 MG) BY MOUTH DAILY    . potassium chloride SA (K-DUR,KLOR-CON) 20 MEQ tablet TAKE 1 TABLET(20 MEQ) BY MOUTH DAILY 90 tablet 0  . predniSONE (DELTASONE) 5 MG tablet Take by mouth.    . prochlorperazine (COMPAZINE) 10 MG tablet TAKE 1 TABLET BY MOUTH EVERY 6 HOURS AS NEEDED FOR NAUSEA OR VOMITING 368 tablet 3  . sildenafil (VIAGRA) 100 MG tablet Take 1 tablet (100 mg total) by mouth daily as needed for erectile dysfunction. 30 tablet 3  . simvastatin (ZOCOR) 20 MG tablet TAKE 1 TABLET BY MOUTH EVERY DAY 90 tablet 3  . Vitamin D, Ergocalciferol, (DRISDOL) 1.25 MG (50000 UT) CAPS capsule TAKE 1 CAPSULE BY MOUTH 1 TIME A WEEK      Objective: BP (!) 144/90   Pulse 83   Temp 98.8 F (37.1 C) (Oral)   Resp 16   Ht 6' (1.829 m)   Wt 84.8 kg   SpO2 99%   BMI 25.36 kg/m  Exam: Physical Exam Constitutional:      General: He is not in acute distress.    Appearance: Normal appearance. He is normal weight. He is not ill-appearing.  HENT:     Head: Normocephalic and atraumatic.     Nose: No congestion.     Mouth/Throat:     Pharynx: No oropharyngeal exudate.  Eyes:     Conjunctiva/sclera: Conjunctivae normal.     Pupils: Pupils are equal, round, and reactive to light.  Neck:     Musculoskeletal: Normal range of motion and neck supple.  Cardiovascular:     Rate and Rhythm: Normal rate and regular rhythm.     Pulses: Normal pulses.     Heart  sounds: Normal heart sounds. No murmur.  Pulmonary:     Effort: Pulmonary effort is normal.     Breath sounds: Normal breath sounds.  Abdominal:     General: Bowel sounds are normal. There is distension.     Tenderness: There is no abdominal tenderness. There is no guarding.     Comments: Moderately tense  Musculoskeletal:        General: No swelling.     Right lower leg: No edema.     Left lower leg: No edema.  Skin:  General: Skin is warm and dry.  Neurological:     Mental Status: He is alert and oriented to person, place, and time.     Cranial Nerves: No cranial nerve deficit.     Sensory: Sensory deficit present.     Motor: Weakness present.     Comments: CN II-XII intact.  5/5 strength in upper extremities, intact sensation to light touch in upper extremities.  4/5 strength for hip flexion bilaterally, 4/5 strength for plantarflexion of left foot, 3/5 strength for dorsiflexion of right foot.  No sensation to light touch in lower extremities.      Labs and Imaging: CBC BMET  No results for input(s): WBC, HGB, HCT, PLT in the last 168 hours. No results for input(s): NA, K, CL, CO2, BUN, CREATININE, GLUCOSE, CALCIUM in the last 168 hours.   Mr Cervical Spine W/o Contrast  Addendum Date: 03/09/2019   ADDENDUM REPORT: 03/09/2019 14:58 ADDENDUM: Critical Value/emergent results were called by telephone at the time of interpretation on 03/09/2019 at 2:55 pm to Dr. Rodell Perna , who verbally acknowledged these results. Electronically Signed   By: Richardean Sale M.D.   On: 03/09/2019 14:58   Result Date: 03/09/2019 CLINICAL DATA:  Bilateral extremity weakness with inability to ambulate. History of prostate cancer with osseous metastatic disease. Bowel changes. EXAM: MRI CERVICAL AND THORACIC SPINE WITHOUT CONTRAST TECHNIQUE: Multiplanar and multiecho pulse sequences of the cervical spine, to include the cervicothoracic junction, and the thoracic spine, were obtained without intravenous  contrast. COMPARISON:  MR lumbar spine 01/26/2019. Abdominopelvic CT 11/17/2018. FINDINGS: MRI CERVICAL SPINE FINDINGS Alignment: Straightening without focal angulation or listhesis. Vertebrae: No evidence of acute fracture. Probable ankylosis across the C2-3 disc and facet joints. There is a small lesion posteriorly in the C3 vertebral body which is best seen on image 5/3, likely a metastasis. No other definite metastases within the cervical spine. Cord: Normal in signal and caliber. Posterior Fossa, vertebral arteries, paraspinal tissues: The craniocervical junction appears normal. There are bilateral vertebral artery flow voids. No significant paraspinal findings. Disc levels: C2-3: As above, probable ankylosis across the vertebral body and facet joints. The axial images do not extend across this disc space level. C3-4: Spondylosis with disc bulging, uncinate spurring and facet hypertrophy. Mild spinal stenosis with mild to moderate foraminal narrowing bilaterally. No cord deformity. C4-5: Spondylosis with uncinate spurring and facet hypertrophy. Mild spinal stenosis with mild to moderate foraminal narrowing bilaterally. No cord deformity. C5-6: Spondylosis with uncinate spurring and facet hypertrophy. No cord deformity. Mild to moderate foraminal narrowing bilaterally. C6-7: Spondylosis with posterior osteophytes and bilateral uncinate spurring. No cord deformity. Mild to moderate foraminal narrowing bilaterally. C7-T1: Spondylosis with posterior osteophytes contributing to severe foraminal narrowing bilaterally. No cord deformity. MRI THORACIC SPINE FINDINGS Alignment:  Normal. Vertebrae: There is widespread osseous metastatic disease within the midthoracic spine with involvement of all the vertebral bodies from T4 through T10. There is also involvement of the posterior elements at T4 and T8. There is a mild pathologic fracture at T4 with osseous retropulsion. The right T8 pedicle and lamina appear expanded  with possible adjacent epidural tumor. Cord: There is cord compression, most notably at the T4 and T8 levels. Mild cord T2 hyperintensity is present at those levels. No definite cord hemorrhage. The conus medullaris extends to the upper L2 level. Paraspinal and other soft tissues: There is paraspinous edema in the midthoracic spine and small bilateral pleural effusions. Disc levels: No significant disc space findings at T1-2, T2-3 or  T3-4. T4-5: No significant disc pathology. As above, there is a T4 metastasis with a mild pathologic fracture and possible ventral epidural tumor. There is mild cord flattening. T5-6: No significant findings. T6-7: There is osseous metastatic disease within the T6 and T7 vertebral bodies with prominence of the posterior epidural fat. The CSF surrounding the cord is effaced without apparent focal epidural tumor at this level. T7-8: There is osseous metastatic disease with posterior epidural tumor, especially within the right T8 pedicle. There is resulting mild cord compression and cord T2 hyperintensity. T8-9: The CSF surrounding the cord is effaced without focal epidural tumor. Mild degenerative changes are present within the lower thoracic spine with disc bulging at T9-10 and L1-2. No cord deformity. IMPRESSION: 1. Multifocal osseous metastatic disease in the midthoracic spine extending from T4 through T10. Mild pathologic fracture at T4. 2. Cord compression at the T4 and T8 levels. The CSF surrounding the cord is largely effaced from T4 through T9. 3. Probable solitary metastasis within the C3 vertebral body. No evidence of cord compression in the cervical spine. 4. Multilevel cervical spondylosis. Electronically Signed: By: Richardean Sale M.D. On: 03/09/2019 14:51   Mr Thoracic Spine W/o Contrast  Addendum Date: 03/09/2019   ADDENDUM REPORT: 03/09/2019 14:58 ADDENDUM: Critical Value/emergent results were called by telephone at the time of interpretation on 03/09/2019 at 2:55 pm  to Dr. Rodell Perna , who verbally acknowledged these results. Electronically Signed   By: Richardean Sale M.D.   On: 03/09/2019 14:58   Result Date: 03/09/2019 CLINICAL DATA:  Bilateral extremity weakness with inability to ambulate. History of prostate cancer with osseous metastatic disease. Bowel changes. EXAM: MRI CERVICAL AND THORACIC SPINE WITHOUT CONTRAST TECHNIQUE: Multiplanar and multiecho pulse sequences of the cervical spine, to include the cervicothoracic junction, and the thoracic spine, were obtained without intravenous contrast. COMPARISON:  MR lumbar spine 01/26/2019. Abdominopelvic CT 11/17/2018. FINDINGS: MRI CERVICAL SPINE FINDINGS Alignment: Straightening without focal angulation or listhesis. Vertebrae: No evidence of acute fracture. Probable ankylosis across the C2-3 disc and facet joints. There is a small lesion posteriorly in the C3 vertebral body which is best seen on image 5/3, likely a metastasis. No other definite metastases within the cervical spine. Cord: Normal in signal and caliber. Posterior Fossa, vertebral arteries, paraspinal tissues: The craniocervical junction appears normal. There are bilateral vertebral artery flow voids. No significant paraspinal findings. Disc levels: C2-3: As above, probable ankylosis across the vertebral body and facet joints. The axial images do not extend across this disc space level. C3-4: Spondylosis with disc bulging, uncinate spurring and facet hypertrophy. Mild spinal stenosis with mild to moderate foraminal narrowing bilaterally. No cord deformity. C4-5: Spondylosis with uncinate spurring and facet hypertrophy. Mild spinal stenosis with mild to moderate foraminal narrowing bilaterally. No cord deformity. C5-6: Spondylosis with uncinate spurring and facet hypertrophy. No cord deformity. Mild to moderate foraminal narrowing bilaterally. C6-7: Spondylosis with posterior osteophytes and bilateral uncinate spurring. No cord deformity. Mild to moderate  foraminal narrowing bilaterally. C7-T1: Spondylosis with posterior osteophytes contributing to severe foraminal narrowing bilaterally. No cord deformity. MRI THORACIC SPINE FINDINGS Alignment:  Normal. Vertebrae: There is widespread osseous metastatic disease within the midthoracic spine with involvement of all the vertebral bodies from T4 through T10. There is also involvement of the posterior elements at T4 and T8. There is a mild pathologic fracture at T4 with osseous retropulsion. The right T8 pedicle and lamina appear expanded with possible adjacent epidural tumor. Cord: There is cord compression, most notably at  the T4 and T8 levels. Mild cord T2 hyperintensity is present at those levels. No definite cord hemorrhage. The conus medullaris extends to the upper L2 level. Paraspinal and other soft tissues: There is paraspinous edema in the midthoracic spine and small bilateral pleural effusions. Disc levels: No significant disc space findings at T1-2, T2-3 or T3-4. T4-5: No significant disc pathology. As above, there is a T4 metastasis with a mild pathologic fracture and possible ventral epidural tumor. There is mild cord flattening. T5-6: No significant findings. T6-7: There is osseous metastatic disease within the T6 and T7 vertebral bodies with prominence of the posterior epidural fat. The CSF surrounding the cord is effaced without apparent focal epidural tumor at this level. T7-8: There is osseous metastatic disease with posterior epidural tumor, especially within the right T8 pedicle. There is resulting mild cord compression and cord T2 hyperintensity. T8-9: The CSF surrounding the cord is effaced without focal epidural tumor. Mild degenerative changes are present within the lower thoracic spine with disc bulging at T9-10 and L1-2. No cord deformity. IMPRESSION: 1. Multifocal osseous metastatic disease in the midthoracic spine extending from T4 through T10. Mild pathologic fracture at T4. 2. Cord compression  at the T4 and T8 levels. The CSF surrounding the cord is largely effaced from T4 through T9. 3. Probable solitary metastasis within the C3 vertebral body. No evidence of cord compression in the cervical spine. 4. Multilevel cervical spondylosis. Electronically Signed: By: Richardean Sale M.D. On: 03/09/2019 14:51   Nm Xofigo Injection  Result Date: 03/03/2019  Trudi Ida was injected intravenously in Nuclear Medicine under the supervision of the attending radiologist     Matilde Haymaker, MD 03/09/2019, 8:21 PM PGY-1, [place] [facility] pager: [phone], text pages welcome ------------------------------------------------------------------------------------------------------------- Upper Level Addendum: I have seen and evaluated this patient along with Dr. Pilar Plate and reviewed the above note, making necessary revisions in blue.  Guadalupe Dawn MD PGY-2 Family Medicine Resident

## 2019-03-10 ENCOUNTER — Inpatient Hospital Stay (HOSPITAL_COMMUNITY): Payer: Medicare Other

## 2019-03-10 ENCOUNTER — Inpatient Hospital Stay: Payer: Medicare Other | Attending: Oncology | Admitting: Oncology

## 2019-03-10 ENCOUNTER — Inpatient Hospital Stay: Payer: Medicare Other

## 2019-03-10 ENCOUNTER — Encounter (HOSPITAL_COMMUNITY): Payer: Self-pay | Admitting: Family Medicine

## 2019-03-10 DIAGNOSIS — G9520 Unspecified cord compression: Secondary | ICD-10-CM

## 2019-03-10 DIAGNOSIS — I5030 Unspecified diastolic (congestive) heart failure: Secondary | ICD-10-CM

## 2019-03-10 DIAGNOSIS — F1490 Cocaine use, unspecified, uncomplicated: Secondary | ICD-10-CM

## 2019-03-10 LAB — IRON AND TIBC
Iron: 45 ug/dL (ref 45–182)
Saturation Ratios: 16 % — ABNORMAL LOW (ref 17.9–39.5)
TIBC: 274 ug/dL (ref 250–450)
UIBC: 229 ug/dL

## 2019-03-10 LAB — CBC
HCT: 30.2 % — ABNORMAL LOW (ref 39.0–52.0)
Hemoglobin: 10.1 g/dL — ABNORMAL LOW (ref 13.0–17.0)
MCH: 29.4 pg (ref 26.0–34.0)
MCHC: 33.4 g/dL (ref 30.0–36.0)
MCV: 87.8 fL (ref 80.0–100.0)
Platelets: 221 10*3/uL (ref 150–400)
RBC: 3.44 MIL/uL — ABNORMAL LOW (ref 4.22–5.81)
RDW: 18 % — ABNORMAL HIGH (ref 11.5–15.5)
WBC: 8.2 10*3/uL (ref 4.0–10.5)
nRBC: 0 % (ref 0.0–0.2)

## 2019-03-10 LAB — RAPID URINE DRUG SCREEN, HOSP PERFORMED
Amphetamines: NOT DETECTED
Barbiturates: NOT DETECTED
Benzodiazepines: POSITIVE — AB
Cocaine: POSITIVE — AB
Opiates: NOT DETECTED
Tetrahydrocannabinol: NOT DETECTED

## 2019-03-10 LAB — GLUCOSE, CAPILLARY
Glucose-Capillary: 188 mg/dL — ABNORMAL HIGH (ref 70–99)
Glucose-Capillary: 153 mg/dL — ABNORMAL HIGH (ref 70–99)
Glucose-Capillary: 149 mg/dL — ABNORMAL HIGH (ref 70–99)

## 2019-03-10 LAB — ECHOCARDIOGRAM COMPLETE
Height: 72 in
Weight: 3118.19 oz

## 2019-03-10 LAB — BASIC METABOLIC PANEL
Anion gap: 13 (ref 5–15)
BUN: 11 mg/dL (ref 8–23)
CO2: 22 mmol/L (ref 22–32)
Calcium: 8.7 mg/dL — ABNORMAL LOW (ref 8.9–10.3)
Chloride: 102 mmol/L (ref 98–111)
Creatinine, Ser: 1.14 mg/dL (ref 0.61–1.24)
GFR calc Af Amer: >60 mL/min (ref >60)
GFR calc non Af Amer: >60 mL/min (ref >60)
Glucose, Bld: 211 mg/dL — ABNORMAL HIGH (ref 70–99)
Potassium: 4 mmol/L (ref 3.5–5.1)
Sodium: 137 mmol/L (ref 135–145)

## 2019-03-10 LAB — FERRITIN: Ferritin: 214 ng/mL (ref 24–336)

## 2019-03-10 MED ORDER — CEFAZOLIN SODIUM-DEXTROSE 1-4 GM/50ML-% IV SOLN
1.0000 g | Freq: Three times a day (TID) | INTRAVENOUS | Status: AC
  Administered 2019-03-10 (×2): 1 g via INTRAVENOUS
  Filled 2019-03-10 (×2): qty 50

## 2019-03-10 MED ORDER — ONDANSETRON HCL 4 MG/2ML IJ SOLN: 4.0000 mg | Freq: Four times a day (QID) | INTRAMUSCULAR | Status: DC | PRN

## 2019-03-10 MED ORDER — ACETAMINOPHEN 325 MG PO TABS: 650.0000 mg | ORAL_TABLET | ORAL | Status: DC | PRN

## 2019-03-10 MED ORDER — SODIUM CHLORIDE 0.9% FLUSH
3.0000 mL | Freq: Two times a day (BID) | INTRAVENOUS | Status: DC
  Administered 2019-03-10 – 2019-03-11 (×2): 3 mL via INTRAVENOUS

## 2019-03-10 MED ORDER — MENTHOL 3 MG MT LOZG: 1.0000 | LOZENGE | OROMUCOSAL | Status: DC | PRN

## 2019-03-10 MED ORDER — SODIUM CHLORIDE 0.9 % IV SOLN
250.0000 mL | INTRAVENOUS | Status: DC
  Administered 2019-03-10: 01:00:00 250 mL via INTRAVENOUS

## 2019-03-10 MED ORDER — HYDROMORPHONE HCL 1 MG/ML IJ SOLN: 1.0000 mg | INTRAMUSCULAR | Status: DC | PRN

## 2019-03-10 MED ORDER — PROCHLORPERAZINE MALEATE 10 MG PO TABS
10.0000 mg | ORAL_TABLET | Freq: Four times a day (QID) | ORAL | Status: DC | PRN
  Filled 2019-03-10: qty 1

## 2019-03-10 MED ORDER — HYDROCODONE-ACETAMINOPHEN 10-325 MG PO TABS: 2.0000 | ORAL_TABLET | ORAL | Status: DC | PRN

## 2019-03-10 MED ORDER — CYCLOBENZAPRINE HCL 10 MG PO TABS: 10.0000 mg | ORAL_TABLET | Freq: Three times a day (TID) | ORAL | Status: DC | PRN

## 2019-03-10 MED ORDER — SODIUM CHLORIDE 0.9% FLUSH: 3.0000 mL | INTRAVENOUS | Status: DC | PRN

## 2019-03-10 MED ORDER — HYDROCODONE-ACETAMINOPHEN 5-325 MG PO TABS
1.0000 | ORAL_TABLET | ORAL | Status: DC | PRN
  Administered 2019-03-10: 1 via ORAL
  Filled 2019-03-10: qty 1

## 2019-03-10 MED ORDER — FENTANYL CITRATE (PF) 100 MCG/2ML IJ SOLN
INTRAMUSCULAR | Status: AC
  Filled 2019-03-10: qty 2

## 2019-03-10 MED ORDER — PANTOPRAZOLE SODIUM 40 MG PO TBEC
40.0000 mg | DELAYED_RELEASE_TABLET | Freq: Every day | ORAL | Status: DC
  Administered 2019-03-10 – 2019-03-11 (×2): 40 mg via ORAL
  Filled 2019-03-10 (×2): qty 1

## 2019-03-10 MED ORDER — KETOROLAC TROMETHAMINE 15 MG/ML IJ SOLN
30.0000 mg | Freq: Four times a day (QID) | INTRAMUSCULAR | Status: AC
  Administered 2019-03-10 (×4): 30 mg via INTRAVENOUS
  Filled 2019-03-10 (×4): qty 2

## 2019-03-10 MED ORDER — ACETAMINOPHEN 650 MG RE SUPP: 650.0000 mg | RECTAL | Status: DC | PRN

## 2019-03-10 MED ORDER — ONDANSETRON HCL 4 MG PO TABS
4.0000 mg | ORAL_TABLET | Freq: Four times a day (QID) | ORAL | Status: DC | PRN
  Administered 2019-03-11: 4 mg via ORAL
  Filled 2019-03-10: qty 1

## 2019-03-10 MED ORDER — AMLODIPINE BESYLATE 10 MG PO TABS
10.0000 mg | ORAL_TABLET | Freq: Every day | ORAL | Status: DC
  Administered 2019-03-10 – 2019-03-11 (×2): 10 mg via ORAL
  Filled 2019-03-10 (×2): qty 1

## 2019-03-10 MED ORDER — CARVEDILOL 12.5 MG PO TABS
12.5000 mg | ORAL_TABLET | Freq: Two times a day (BID) | ORAL | Status: DC
  Administered 2019-03-10 – 2019-03-11 (×4): 12.5 mg via ORAL
  Filled 2019-03-10 (×4): qty 1

## 2019-03-10 MED ORDER — GABAPENTIN 300 MG PO CAPS
300.0000 mg | ORAL_CAPSULE | Freq: Every day | ORAL | Status: DC
  Administered 2019-03-10 (×2): 300 mg via ORAL
  Filled 2019-03-10 (×2): qty 1

## 2019-03-10 MED ORDER — PHENOL 1.4 % MT LIQD: 1.0000 | OROMUCOSAL | Status: DC | PRN

## 2019-03-10 MED ORDER — SIMVASTATIN 20 MG PO TABS
20.0000 mg | ORAL_TABLET | Freq: Every day | ORAL | Status: DC
  Administered 2019-03-10 – 2019-03-11 (×2): 20 mg via ORAL
  Filled 2019-03-10 (×2): qty 1

## 2019-03-10 MED ORDER — OXYBUTYNIN CHLORIDE ER 10 MG PO TB24
10.0000 mg | ORAL_TABLET | Freq: Every day | ORAL | Status: DC
  Administered 2019-03-10: 21:00:00 10 mg via ORAL
  Filled 2019-03-10 (×3): qty 1

## 2019-03-10 MED FILL — Gelatin Absorbable MT Powder: OROMUCOSAL | Qty: 1 | Status: AC

## 2019-03-10 MED FILL — Thrombin For Soln 5000 Unit: CUTANEOUS | Qty: 5000 | Status: AC

## 2019-03-10 NOTE — Discharge Summary (Signed)
[facility] Discharge Summary  Patient name: Dennis Fischer Medical record number: [id number] Date of birth: [birth year] Age: 73 y.o. Gender: male Date of Admission: 03/09/2019  Date of Discharge: 03/11/2019 Admitting Physician: Leeanne Rio, MD  Primary Care Provider: [name], MD Consultants: Neurosurgery  Indication for Hospitalization: Lower extremity weakness  Discharge Diagnoses/Problem List:    Disposition: Discharge to CIR Discharge Condition: Improved Discharge Exam:  Gen: Alert and in no apparent distress, sitting in chair Heart: RRR with no murmurs noted Resp: CTAB, no wheezing Abd:  bowel sounds present MSK: Hip flexors 4/5 strength, plantarflexion 4/5 strength, dorsiflexion 5/5 strength. Fine touch sensation present laterally and medially in lower limbs  Brief Hospital Course:  Dennis Chaneyis a 73 y.o.malepresenting with lower extremity weakness for about 2 months. His past medical history is remarkable for metastatic prostate cancer(Dx 2015), GERD, prediabetes, HLD, HTN.  His hospital course is outlined below.  Spinal cord compression 2/2 metastatic prostate cancer Patient presented with progressive lower extremity weakness and numbness.  Had been advised to come to ED urgently by Ortho, after MRI of cervical and thoracic spine showed spinal cord compression.  Admission details can be found in H&P.  On admission, patient received emergent neurosurgery consultation and was taken to the OR.  On 6/29, neurosurgery performed T7-T8-T9 decompressive laminectomy with resection of epidural tumor, microdissection.  Patient tolerated the procedure well and the following morning had improved sensation and strength in his bilateral lower extremities.  PT/OT worked with patient well inpatient and recommended CIR.  Questionable Heart failure? Patient did not have heart failure listed on his problem list, but had a prescription for Lasix and  Coreg.  Given this, echo was ordered, which showed 60-65% ejection fraction. Weight 84.8kg 6/29, 88.4kg 6/30.  Issues for Follow Up:  1. Intensive rehab 2. Follow up with Onc for continued radiation in 2 weeks 3. Wean pain meds as tolerated 4. Consider restarted aspirin 5. Recommend CBC since patient had anemia with unknown source  Significant Procedures: Spinal cord decompression, echo  Significant Labs and Imaging:  Recent Labs  Lab 03/10/19 1056 03/11/19 0500  WBC 8.2 9.8  HGB 10.1* 8.0*  HCT 30.2* 23.4*  PLT 221 186   Recent Labs  Lab 03/10/19 1056 03/11/19 0500  NA 137 140  K 4.0 3.7  CL 102 108  CO2 22 23  GLUCOSE 211* 170*  BUN 11 19  CREATININE 1.14 0.98  CALCIUM 8.7* 8.6*    Dg Thoracic Spine 4v  Result Date: 03/09/2019 CLINICAL DATA:  73 year old male undergoing T7 through T9 laminectomy for spinal metastatic disease with cord compression. EXAM: THORACIC SPINE - 4+ VIEW COMPARISON:  Cervical and thoracic spine MRI earlier today. Lumbar MRI 01/26/2019. CT Abdomen and Pelvis 11/17/2018. Chest radiographs 09/04/2017. FINDINGS: Transitional lumbosacral anatomy suspected when comparing these various exams. The same numbering system will be used as on the thoracic spine MRI today, with full size ribs suspected at T12 and hypoplastic ribs at L1. Subsequently, there is a fully lumbarized S1 level suspected which differs from the lumbar spine numbering on prior CT and MRI. Portable AP supine view at at 2136 hours. The T10-T11 disc spaces marked with scissors. Portable intraoperative cross-table lateral views of the thoracic and lumbar spine. These cross-table lateral images demonstrate thoracic spine localization at T8-T9. IMPRESSION: 1. Transitional lumbosacral anatomy suspected, probably with a fully lumbarized S1 level which differs from the MRI and CT numbering 01/26/2019 and 11/17/2018. 2. Same numbering system used  as on the cervical and thoracic spine MRI earlier today.  3. Intraoperative localization at T10-T11 on the AP and T8-T9 on the cross-table lateral views. Electronically Signed   By: Genevie Ann M.D.   On: 03/09/2019 23:10   Mr Cervical Spine W/o Contrast  Addendum Date: 03/09/2019   ADDENDUM REPORT: 03/09/2019 14:58 ADDENDUM: Critical Value/emergent results were called by telephone at the time of interpretation on 03/09/2019 at 2:55 pm to Dr. Rodell Perna , who verbally acknowledged these results. Electronically Signed   By: Richardean Sale M.D.   On: 03/09/2019 14:58   Result Date: 03/09/2019 CLINICAL DATA:  Bilateral extremity weakness with inability to ambulate. History of prostate cancer with osseous metastatic disease. Bowel changes. EXAM: MRI CERVICAL AND THORACIC SPINE WITHOUT CONTRAST TECHNIQUE: Multiplanar and multiecho pulse sequences of the cervical spine, to include the cervicothoracic junction, and the thoracic spine, were obtained without intravenous contrast. COMPARISON:  MR lumbar spine 01/26/2019. Abdominopelvic CT 11/17/2018. FINDINGS: MRI CERVICAL SPINE FINDINGS Alignment: Straightening without focal angulation or listhesis. Vertebrae: No evidence of acute fracture. Probable ankylosis across the C2-3 disc and facet joints. There is a small lesion posteriorly in the C3 vertebral body which is best seen on image 5/3, likely a metastasis. No other definite metastases within the cervical spine. Cord: Normal in signal and caliber. Posterior Fossa, vertebral arteries, paraspinal tissues: The craniocervical junction appears normal. There are bilateral vertebral artery flow voids. No significant paraspinal findings. Disc levels: C2-3: As above, probable ankylosis across the vertebral body and facet joints. The axial images do not extend across this disc space level. C3-4: Spondylosis with disc bulging, uncinate spurring and facet hypertrophy. Mild spinal stenosis with mild to moderate foraminal narrowing bilaterally. No cord deformity. C4-5: Spondylosis with  uncinate spurring and facet hypertrophy. Mild spinal stenosis with mild to moderate foraminal narrowing bilaterally. No cord deformity. C5-6: Spondylosis with uncinate spurring and facet hypertrophy. No cord deformity. Mild to moderate foraminal narrowing bilaterally. C6-7: Spondylosis with posterior osteophytes and bilateral uncinate spurring. No cord deformity. Mild to moderate foraminal narrowing bilaterally. C7-T1: Spondylosis with posterior osteophytes contributing to severe foraminal narrowing bilaterally. No cord deformity. MRI THORACIC SPINE FINDINGS Alignment:  Normal. Vertebrae: There is widespread osseous metastatic disease within the midthoracic spine with involvement of all the vertebral bodies from T4 through T10. There is also involvement of the posterior elements at T4 and T8. There is a mild pathologic fracture at T4 with osseous retropulsion. The right T8 pedicle and lamina appear expanded with possible adjacent epidural tumor. Cord: There is cord compression, most notably at the T4 and T8 levels. Mild cord T2 hyperintensity is present at those levels. No definite cord hemorrhage. The conus medullaris extends to the upper L2 level. Paraspinal and other soft tissues: There is paraspinous edema in the midthoracic spine and small bilateral pleural effusions. Disc levels: No significant disc space findings at T1-2, T2-3 or T3-4. T4-5: No significant disc pathology. As above, there is a T4 metastasis with a mild pathologic fracture and possible ventral epidural tumor. There is mild cord flattening. T5-6: No significant findings. T6-7: There is osseous metastatic disease within the T6 and T7 vertebral bodies with prominence of the posterior epidural fat. The CSF surrounding the cord is effaced without apparent focal epidural tumor at this level. T7-8: There is osseous metastatic disease with posterior epidural tumor, especially within the right T8 pedicle. There is resulting mild cord compression and  cord T2 hyperintensity. T8-9: The CSF surrounding the cord is effaced without  focal epidural tumor. Mild degenerative changes are present within the lower thoracic spine with disc bulging at T9-10 and L1-2. No cord deformity. IMPRESSION: 1. Multifocal osseous metastatic disease in the midthoracic spine extending from T4 through T10. Mild pathologic fracture at T4. 2. Cord compression at the T4 and T8 levels. The CSF surrounding the cord is largely effaced from T4 through T9. 3. Probable solitary metastasis within the C3 vertebral body. No evidence of cord compression in the cervical spine. 4. Multilevel cervical spondylosis. Electronically Signed: By: Richardean Sale M.D. On: 03/09/2019 14:51   Mr Thoracic Spine W/o Contrast  Addendum Date: 03/09/2019   ADDENDUM REPORT: 03/09/2019 14:58 ADDENDUM: Critical Value/emergent results were called by telephone at the time of interpretation on 03/09/2019 at 2:55 pm to Dr. Rodell Perna , who verbally acknowledged these results. Electronically Signed   By: Richardean Sale M.D.   On: 03/09/2019 14:58   Result Date: 03/09/2019 CLINICAL DATA:  Bilateral extremity weakness with inability to ambulate. History of prostate cancer with osseous metastatic disease. Bowel changes. EXAM: MRI CERVICAL AND THORACIC SPINE WITHOUT CONTRAST TECHNIQUE: Multiplanar and multiecho pulse sequences of the cervical spine, to include the cervicothoracic junction, and the thoracic spine, were obtained without intravenous contrast. COMPARISON:  MR lumbar spine 01/26/2019. Abdominopelvic CT 11/17/2018. FINDINGS: MRI CERVICAL SPINE FINDINGS Alignment: Straightening without focal angulation or listhesis. Vertebrae: No evidence of acute fracture. Probable ankylosis across the C2-3 disc and facet joints. There is a small lesion posteriorly in the C3 vertebral body which is best seen on image 5/3, likely a metastasis. No other definite metastases within the cervical spine. Cord: Normal in signal and  caliber. Posterior Fossa, vertebral arteries, paraspinal tissues: The craniocervical junction appears normal. There are bilateral vertebral artery flow voids. No significant paraspinal findings. Disc levels: C2-3: As above, probable ankylosis across the vertebral body and facet joints. The axial images do not extend across this disc space level. C3-4: Spondylosis with disc bulging, uncinate spurring and facet hypertrophy. Mild spinal stenosis with mild to moderate foraminal narrowing bilaterally. No cord deformity. C4-5: Spondylosis with uncinate spurring and facet hypertrophy. Mild spinal stenosis with mild to moderate foraminal narrowing bilaterally. No cord deformity. C5-6: Spondylosis with uncinate spurring and facet hypertrophy. No cord deformity. Mild to moderate foraminal narrowing bilaterally. C6-7: Spondylosis with posterior osteophytes and bilateral uncinate spurring. No cord deformity. Mild to moderate foraminal narrowing bilaterally. C7-T1: Spondylosis with posterior osteophytes contributing to severe foraminal narrowing bilaterally. No cord deformity. MRI THORACIC SPINE FINDINGS Alignment:  Normal. Vertebrae: There is widespread osseous metastatic disease within the midthoracic spine with involvement of all the vertebral bodies from T4 through T10. There is also involvement of the posterior elements at T4 and T8. There is a mild pathologic fracture at T4 with osseous retropulsion. The right T8 pedicle and lamina appear expanded with possible adjacent epidural tumor. Cord: There is cord compression, most notably at the T4 and T8 levels. Mild cord T2 hyperintensity is present at those levels. No definite cord hemorrhage. The conus medullaris extends to the upper L2 level. Paraspinal and other soft tissues: There is paraspinous edema in the midthoracic spine and small bilateral pleural effusions. Disc levels: No significant disc space findings at T1-2, T2-3 or T3-4. T4-5: No significant disc pathology. As  above, there is a T4 metastasis with a mild pathologic fracture and possible ventral epidural tumor. There is mild cord flattening. T5-6: No significant findings. T6-7: There is osseous metastatic disease within the T6 and T7 vertebral bodies with  prominence of the posterior epidural fat. The CSF surrounding the cord is effaced without apparent focal epidural tumor at this level. T7-8: There is osseous metastatic disease with posterior epidural tumor, especially within the right T8 pedicle. There is resulting mild cord compression and cord T2 hyperintensity. T8-9: The CSF surrounding the cord is effaced without focal epidural tumor. Mild degenerative changes are present within the lower thoracic spine with disc bulging at T9-10 and L1-2. No cord deformity. IMPRESSION: 1. Multifocal osseous metastatic disease in the midthoracic spine extending from T4 through T10. Mild pathologic fracture at T4. 2. Cord compression at the T4 and T8 levels. The CSF surrounding the cord is largely effaced from T4 through T9. 3. Probable solitary metastasis within the C3 vertebral body. No evidence of cord compression in the cervical spine. 4. Multilevel cervical spondylosis. Electronically Signed: By: Richardean Sale M.D. On: 03/09/2019 14:51   Nm Xofigo Injection  Result Date: 03/03/2019  Trudi Ida was injected intravenously in Nuclear Medicine under the supervision of the attending radiologist    Results/Tests Pending at Time of Discharge: FOBT  Discharge Medications:  Allergies as of 03/11/2019      Reactions   Lisinopril Swelling   Facial swelling   Cat Hair Extract    eyes swell   Mangifera Indica    MANGO --   Mango Flavor    throat swells with mango   Other    Peanut-containing Drug Products    Pistachio Nut (diagnostic)    hands and feet burn/itch   Sunflower Oil    Sunflower seed allergy      Medication List    STOP taking these medications   aspirin 81 MG tablet   calcium-vitamin D 500-200 MG-UNIT  tablet Commonly known as: OSCAL WITH D   multivitamin tablet   omeprazole 20 MG capsule Commonly known as: PRILOSEC Replaced by: pantoprazole 40 MG tablet   potassium chloride SA 20 MEQ tablet Commonly known as: K-DUR   sildenafil 100 MG tablet Commonly known as: Viagra     TAKE these medications   amLODipine 10 MG tablet Commonly known as: NORVASC TAKE 1 TABLET(10 MG) BY MOUTH DAILY What changed: See the new instructions.   carvedilol 12.5 MG tablet Commonly known as: COREG TAKE 1 TABLET BY MOUTH TWICE DAILY WITH A MEAL What changed: See the new instructions.   cyclobenzaprine 10 MG tablet Commonly known as: FLEXERIL Take 1 tablet (10 mg total) by mouth 3 (three) times daily as needed for muscle spasms.   gabapentin 300 MG capsule Commonly known as: NEURONTIN Take 1 capsule (300 mg total) by mouth at bedtime.   HYDROcodone-acetaminophen 10-325 MG tablet Commonly known as: NORCO Take 2 tablets by mouth every 4 (four) hours as needed for severe pain ((score 7 to 10)).   HYDROcodone-acetaminophen 5-325 MG tablet Commonly known as: NORCO/VICODIN Take 1 tablet by mouth every 4 (four) hours as needed for moderate pain ((score 4 to 6)).   lidocaine-prilocaine cream Commonly known as: EMLA Apply 1 application topically as needed. What changed: reasons to take this   ondansetron 4 MG tablet Commonly known as: ZOFRAN Take 1 tablet (4 mg total) by mouth every 6 (six) hours as needed for nausea or vomiting.   ondansetron 4 MG/2ML Soln injection Commonly known as: ZOFRAN Inject 2 mLs (4 mg total) into the vein every 6 (six) hours as needed for nausea or vomiting.   oxybutynin 10 MG 24 hr tablet Commonly known as: DITROPAN-XL Take 1 tablet (10 mg  total) by mouth at bedtime. What changed: See the new instructions.   pantoprazole 40 MG tablet Commonly known as: PROTONIX Take 1 tablet (40 mg total) by mouth daily. Start taking on: March 12, 2019 Replaces: omeprazole 20  MG capsule   prochlorperazine 10 MG tablet Commonly known as: COMPAZINE TAKE 1 TABLET BY MOUTH EVERY 6 HOURS AS NEEDED FOR NAUSEA OR VOMITING What changed: See the new instructions.   simvastatin 20 MG tablet Commonly known as: ZOCOR TAKE 1 TABLET BY MOUTH EVERY DAY       Discharge Instructions: Please refer to Patient Instructions section of EMR for full details.  Patient was counseled important signs and symptoms that should prompt return to medical care, changes in medications, dietary instructions, activity restrictions, and follow up appointments.   Follow-Up Appointments: Follow up with neurosurgery  Lurline Del, MD 03/11/2019, 3:07 PM PGY-1, [place]

## 2019-03-10 NOTE — Progress Notes (Signed)
Arrived from PACU at 0100 to 3w13. Alert and oriented. Denies any pain. Surgical H.C. dressing C/D/I and hemovac to suction. Call light within reach.

## 2019-03-10 NOTE — Progress Notes (Signed)
Rehab Admissions Coordinator Note:  Patient was screened by Michel Santee for appropriateness for an Inpatient Acute Rehab Consult.  At this time, we are recommending Inpatient Rehab consult.  Please place IP Rehab MD order.   Michel Santee 03/10/2019, 10:17 AM  I can be reached at [phone].

## 2019-03-10 NOTE — Progress Notes (Signed)
Postop day 1.  Overall patient feels much better.  Pain well controlled.  Sensation and strength improved in both lower extremities.  Afebrile.  Vital signs are stable.  Drain output low.  Awake and alert.  Oriented and appropriate.  Relative sensory level much improved and very close to normal.  Motor examination with 4 to 4+/5 strength in both lower extremities also much improved from preop.  Wound clean and dry.  Chest and abdomen benign.  Overall doing very well.  Continue drain for 1 more day.  Mobilize with therapy.  I agree the patient would be an excellent candidate for inpatient rehabilitation.  Patient will need adjunctive radiation therapy to his thoracic spine.  My office will contact Dr. Tammi Klippel with regard to this.

## 2019-03-10 NOTE — Progress Notes (Addendum)
Family Medicine Teaching Service Daily Progress Note Intern Pager: [phone]  Patient name: Dennis Fischer Medical record number: [id number] Date of birth: Aug 06, 1946 Age: 73 y.o. Gender: male  Primary Care Provider: [name], MD Consultants: Neurosurgery Code Status: Full  Pt Overview and Major Events to Date:  6/29 Admitted to [facility], T7-T8-T9 Laminectomy  Assessment and Plan: Dennis Fischer is a 73 y.o. male presenting with lower extremity weakness for about 2 months.  His past medical history is remarkable for metastatic prostate cancer(Dx 2015), GERD, prediabetes, HLD, HTN.  Worsening lower extremity weakness/numbness 2/2 spinal cord compression from metastatic prostate cancer, S/P T7-T8-T9 Laminectomy POD #0.  Neurosurgery performed T7-T8-T9 decompressive laminectomy with resection of epidural tumor, microdissection on 6/29 at 11 PM.  CBC pending this AM.  Vitals stable after surgery, BP 148/99 this a.m.  On exam patient endorses sensation in his bilateral lower extremities, and is able to move BLE. -Neurosurgery following, appreciate recommendations -Will await neurosurgery recommendations before ordering PT/OT -We will reach out to oncology regarding further recommendations -Hold ASA -Follow-up a.m. labs -post-op pain regimen per neurosurgery: Norco 1-2 tabs every 4 hours as needed, Dilaudid 1 mg every 2 hours as needed, Toradol 30 mg every 6 hours, Tylenol 650 mg every 4 hours as needed, Flexeril 10 mg 3 times daily as needed - dexamethasone per neurosurgery, given prediabetes, monitor CBGs AC/HS while on steroids  Prostate cancer, stage IV has been following for several years with oncology and has a port in his right chest for his current chemotherapeutic treatment.    Neurosurgery did mention possibility of further radiation therapy to spine. -We will follow-up with oncology today regarding further treatment -Holding Zytiga in the setting of surgery  Anemia,  normocytic CBC from 6/9 shows a hemoglobin of 12.0 with an MCV of 86.  He reports occasional bright red blood per rectum with bowel movements.    CBC pending this AM.  Iron saturation mildly low at 16, iron panel otherwise within normal limits. -F/U FOBT - f/u cbc  Possible history of heart failure [name] denies any history of heart failure.  There is no documentation of an echocardiogram in his chart.  His home medication historically included carvedilol, Lasix, potassium supplementation.  He now only takes carvedilol.    He remains euvolemic on exam. -Continue carvedilol 12.5 mg twice daily - obtain Echo  HTN Home meds: norvasc 10mg  QD, coreg 12.5mg  BID.  BP 138/82 this AM. - cont norvasc, coreg  History of Cocaine Use Patient noted cocaine use on admission.  He is on Coreg for unclear indication.  Obtaining echo to determine if HF.  If coreg for HTN, could consider another agent for HTN.   - obtain UDS - coreg at current dose should be safe in cocaine use given alpha activity  Urinary incontinence/bladder spasms Home medication includes oxybutynin 10 mg daily. - continue home oxybutynin 10mg   History of headaches He reports taking gabapentin 300 mg at night for headaches. -Gabapentin 300 mg nightly  GERD Home medication includes omeprazole 20 mg daily. -Pantoprazole 40 mg p.o. per formulary  HLD No lipid panel in chart. Consider lipid panel as outpatient -Continue simvastatin 20 mg daily  FEN/GI: NPO PPx: SCDs  Disposition: pending clinical improvement and oncology/neurosurg recs  Subjective:  Patient denies complaints this morning.  States that his pain after surgery is well controlled.  States "my legs move much better, I can feel them again."  Objective: Temp:  [97.6 F (36.4 C)-98.9 F (37.2 C)] 98.1  F (36.7 C) (06/30 0428) Pulse Rate:  [71-96] 81 (06/30 0428) Resp:  [13-39] 18 (06/30 0428) BP: (125-186)/(69-108) 148/99 (06/30 0428) SpO2:  [95 %-100  %] 100 % (06/30 0428) Weight:  [84.8 kg-88.4 kg] 88.4 kg (06/30 0049)  Physical Exam:  General: 73 y.o. male in NAD Cardio: RRR no m/r/g Lungs: CTAB, no wheezing, no rhonchi, no crackles, no IWOB on RA Abdomen: Soft, non-tender to palpation, non-distended, positive bowel sounds Skin: warm and dry Extremities: No edema, sensation intact BLE, able to move BLE   Laboratory: No results for input(s): WBC, HGB, HCT, PLT in the last 168 hours. No results for input(s): NA, K, CL, CO2, BUN, CREATININE, CALCIUM, PROT, BILITOT, ALKPHOS, ALT, AST, GLUCOSE in the last 168 hours.  Invalid input(s): LABALBU   Imaging/Diagnostic Tests: Dg Thoracic Spine 4v  Result Date: 03/09/2019 CLINICAL DATA:  73 year old male undergoing T7 through T9 laminectomy for spinal metastatic disease with cord compression. EXAM: THORACIC SPINE - 4+ VIEW COMPARISON:  Cervical and thoracic spine MRI earlier today. Lumbar MRI 01/26/2019. CT Abdomen and Pelvis 11/17/2018. Chest radiographs 09/04/2017. FINDINGS: Transitional lumbosacral anatomy suspected when comparing these various exams. The same numbering system will be used as on the thoracic spine MRI today, with full size ribs suspected at T12 and hypoplastic ribs at L1. Subsequently, there is a fully lumbarized S1 level suspected which differs from the lumbar spine numbering on prior CT and MRI. Portable AP supine view at at 2136 hours. The T10-T11 disc spaces marked with scissors. Portable intraoperative cross-table lateral views of the thoracic and lumbar spine. These cross-table lateral images demonstrate thoracic spine localization at T8-T9. IMPRESSION: 1. Transitional lumbosacral anatomy suspected, probably with a fully lumbarized S1 level which differs from the MRI and CT numbering 01/26/2019 and 11/17/2018. 2. Same numbering system used as on the cervical and thoracic spine MRI earlier today. 3. Intraoperative localization at T10-T11 on the AP and T8-T9 on the cross-table  lateral views. Electronically Signed   By: Genevie Ann M.D.   On: 03/09/2019 23:10   Mr Cervical Spine W/o Contrast  Addendum Date: 03/09/2019   ADDENDUM REPORT: 03/09/2019 14:58 ADDENDUM: Critical Value/emergent results were called by telephone at the time of interpretation on 03/09/2019 at 2:55 pm to Dr. Rodell Perna , who verbally acknowledged these results. Electronically Signed   By: Richardean Sale M.D.   On: 03/09/2019 14:58   Result Date: 03/09/2019 CLINICAL DATA:  Bilateral extremity weakness with inability to ambulate. History of prostate cancer with osseous metastatic disease. Bowel changes. EXAM: MRI CERVICAL AND THORACIC SPINE WITHOUT CONTRAST TECHNIQUE: Multiplanar and multiecho pulse sequences of the cervical spine, to include the cervicothoracic junction, and the thoracic spine, were obtained without intravenous contrast. COMPARISON:  MR lumbar spine 01/26/2019. Abdominopelvic CT 11/17/2018. FINDINGS: MRI CERVICAL SPINE FINDINGS Alignment: Straightening without focal angulation or listhesis. Vertebrae: No evidence of acute fracture. Probable ankylosis across the C2-3 disc and facet joints. There is a small lesion posteriorly in the C3 vertebral body which is best seen on image 5/3, likely a metastasis. No other definite metastases within the cervical spine. Cord: Normal in signal and caliber. Posterior Fossa, vertebral arteries, paraspinal tissues: The craniocervical junction appears normal. There are bilateral vertebral artery flow voids. No significant paraspinal findings. Disc levels: C2-3: As above, probable ankylosis across the vertebral body and facet joints. The axial images do not extend across this disc space level. C3-4: Spondylosis with disc bulging, uncinate spurring and facet hypertrophy. Mild spinal stenosis with mild to  moderate foraminal narrowing bilaterally. No cord deformity. C4-5: Spondylosis with uncinate spurring and facet hypertrophy. Mild spinal stenosis with mild to moderate  foraminal narrowing bilaterally. No cord deformity. C5-6: Spondylosis with uncinate spurring and facet hypertrophy. No cord deformity. Mild to moderate foraminal narrowing bilaterally. C6-7: Spondylosis with posterior osteophytes and bilateral uncinate spurring. No cord deformity. Mild to moderate foraminal narrowing bilaterally. C7-T1: Spondylosis with posterior osteophytes contributing to severe foraminal narrowing bilaterally. No cord deformity. MRI THORACIC SPINE FINDINGS Alignment:  Normal. Vertebrae: There is widespread osseous metastatic disease within the midthoracic spine with involvement of all the vertebral bodies from T4 through T10. There is also involvement of the posterior elements at T4 and T8. There is a mild pathologic fracture at T4 with osseous retropulsion. The right T8 pedicle and lamina appear expanded with possible adjacent epidural tumor. Cord: There is cord compression, most notably at the T4 and T8 levels. Mild cord T2 hyperintensity is present at those levels. No definite cord hemorrhage. The conus medullaris extends to the upper L2 level. Paraspinal and other soft tissues: There is paraspinous edema in the midthoracic spine and small bilateral pleural effusions. Disc levels: No significant disc space findings at T1-2, T2-3 or T3-4. T4-5: No significant disc pathology. As above, there is a T4 metastasis with a mild pathologic fracture and possible ventral epidural tumor. There is mild cord flattening. T5-6: No significant findings. T6-7: There is osseous metastatic disease within the T6 and T7 vertebral bodies with prominence of the posterior epidural fat. The CSF surrounding the cord is effaced without apparent focal epidural tumor at this level. T7-8: There is osseous metastatic disease with posterior epidural tumor, especially within the right T8 pedicle. There is resulting mild cord compression and cord T2 hyperintensity. T8-9: The CSF surrounding the cord is effaced without focal  epidural tumor. Mild degenerative changes are present within the lower thoracic spine with disc bulging at T9-10 and L1-2. No cord deformity. IMPRESSION: 1. Multifocal osseous metastatic disease in the midthoracic spine extending from T4 through T10. Mild pathologic fracture at T4. 2. Cord compression at the T4 and T8 levels. The CSF surrounding the cord is largely effaced from T4 through T9. 3. Probable solitary metastasis within the C3 vertebral body. No evidence of cord compression in the cervical spine. 4. Multilevel cervical spondylosis. Electronically Signed: By: Richardean Sale M.D. On: 03/09/2019 14:51   Mr Thoracic Spine W/o Contrast  Addendum Date: 03/09/2019   ADDENDUM REPORT: 03/09/2019 14:58 ADDENDUM: Critical Value/emergent results were called by telephone at the time of interpretation on 03/09/2019 at 2:55 pm to Dr. Rodell Perna , who verbally acknowledged these results. Electronically Signed   By: Richardean Sale M.D.   On: 03/09/2019 14:58   Result Date: 03/09/2019 CLINICAL DATA:  Bilateral extremity weakness with inability to ambulate. History of prostate cancer with osseous metastatic disease. Bowel changes. EXAM: MRI CERVICAL AND THORACIC SPINE WITHOUT CONTRAST TECHNIQUE: Multiplanar and multiecho pulse sequences of the cervical spine, to include the cervicothoracic junction, and the thoracic spine, were obtained without intravenous contrast. COMPARISON:  MR lumbar spine 01/26/2019. Abdominopelvic CT 11/17/2018. FINDINGS: MRI CERVICAL SPINE FINDINGS Alignment: Straightening without focal angulation or listhesis. Vertebrae: No evidence of acute fracture. Probable ankylosis across the C2-3 disc and facet joints. There is a small lesion posteriorly in the C3 vertebral body which is best seen on image 5/3, likely a metastasis. No other definite metastases within the cervical spine. Cord: Normal in signal and caliber. Posterior Fossa, vertebral arteries, paraspinal tissues: The  craniocervical  junction appears normal. There are bilateral vertebral artery flow voids. No significant paraspinal findings. Disc levels: C2-3: As above, probable ankylosis across the vertebral body and facet joints. The axial images do not extend across this disc space level. C3-4: Spondylosis with disc bulging, uncinate spurring and facet hypertrophy. Mild spinal stenosis with mild to moderate foraminal narrowing bilaterally. No cord deformity. C4-5: Spondylosis with uncinate spurring and facet hypertrophy. Mild spinal stenosis with mild to moderate foraminal narrowing bilaterally. No cord deformity. C5-6: Spondylosis with uncinate spurring and facet hypertrophy. No cord deformity. Mild to moderate foraminal narrowing bilaterally. C6-7: Spondylosis with posterior osteophytes and bilateral uncinate spurring. No cord deformity. Mild to moderate foraminal narrowing bilaterally. C7-T1: Spondylosis with posterior osteophytes contributing to severe foraminal narrowing bilaterally. No cord deformity. MRI THORACIC SPINE FINDINGS Alignment:  Normal. Vertebrae: There is widespread osseous metastatic disease within the midthoracic spine with involvement of all the vertebral bodies from T4 through T10. There is also involvement of the posterior elements at T4 and T8. There is a mild pathologic fracture at T4 with osseous retropulsion. The right T8 pedicle and lamina appear expanded with possible adjacent epidural tumor. Cord: There is cord compression, most notably at the T4 and T8 levels. Mild cord T2 hyperintensity is present at those levels. No definite cord hemorrhage. The conus medullaris extends to the upper L2 level. Paraspinal and other soft tissues: There is paraspinous edema in the midthoracic spine and small bilateral pleural effusions. Disc levels: No significant disc space findings at T1-2, T2-3 or T3-4. T4-5: No significant disc pathology. As above, there is a T4 metastasis with a mild pathologic fracture and possible ventral  epidural tumor. There is mild cord flattening. T5-6: No significant findings. T6-7: There is osseous metastatic disease within the T6 and T7 vertebral bodies with prominence of the posterior epidural fat. The CSF surrounding the cord is effaced without apparent focal epidural tumor at this level. T7-8: There is osseous metastatic disease with posterior epidural tumor, especially within the right T8 pedicle. There is resulting mild cord compression and cord T2 hyperintensity. T8-9: The CSF surrounding the cord is effaced without focal epidural tumor. Mild degenerative changes are present within the lower thoracic spine with disc bulging at T9-10 and L1-2. No cord deformity. IMPRESSION: 1. Multifocal osseous metastatic disease in the midthoracic spine extending from T4 through T10. Mild pathologic fracture at T4. 2. Cord compression at the T4 and T8 levels. The CSF surrounding the cord is largely effaced from T4 through T9. 3. Probable solitary metastasis within the C3 vertebral body. No evidence of cord compression in the cervical spine. 4. Multilevel cervical spondylosis. Electronically Signed: By: Richardean Sale M.D. On: 03/09/2019 14:51    [place], Bernita Raisin, DO 03/10/2019, 7:42 AM PGY-1, [facility] Intern pager: [phone], text pages welcome

## 2019-03-10 NOTE — Evaluation (Signed)
Occupational Therapy Evaluation Patient Details Name: Dennis Fischer MRN: [id number] DOB: Apr 26, 1946 Today's Date: 03/10/2019    History of Present Illness Patient is a 73 year old male who [facility] the [facility] folloing a progressive onset of weakness. Over the past month he has become weaker until the point where he was using a wheelchair for primary mobility. His wife has been assisting him. He was found to have a epidural tumor 2nd to prostate cancer in his spine. He had a  t-7,8,9 decompression on 03/09/2019. PMH : prostate cancer, metastatic bone caner, HTN   Clinical Impression   Patient is s/p T7-8-9- decompression surgery resulting in functional limitations due to the deficits listed below (see OT problem list). Pt currently requires mod (A) for basic transfer and utilized a stedy this session. Pt motivated and eager to engage in therapy.  Patient will benefit from skilled OT acutely to increase independence and safety with ADLS to allow discharge CIR.     Follow Up Recommendations  CIR    Equipment Recommendations  3 in 1 bedside commode    Recommendations for Other Services Rehab consult     Precautions / Restrictions Precautions Precautions: Fall Restrictions Weight Bearing Restrictions: No      Mobility Bed Mobility Overal bed mobility: Needs Assistance Bed Mobility: Sit to Supine       Sit to supine: Mod assist   General bed mobility comments: requires mod cues for sequences and mod (A) to elevate bil le onto bed surface  Transfers Overall transfer level: Needs assistance   Transfers: Sit to/from Stand Sit to Stand: Mod assist         General transfer comment: pt able to sequence task and use stedy . pt sustain position in stedy for >10 minutes    Balance Overall balance assessment: Needs assistance Sitting-balance support: Single extremity supported;Feet supported Sitting balance-Leahy Scale: Fair     Standing balance support: Bilateral  upper extremity supported Standing balance-Leahy Scale: Good Standing balance comment: reliant on BIL UE support                           ADL either performed or assessed with clinical judgement   ADL Overall ADL's : Needs assistance/impaired Eating/Feeding: Set up;Sitting   Grooming: Oral care;Wash/dry face;Set up;Sitting Grooming Details (indicate cue type and reason): using stedy at sink level and washing hands for the first time in over a month. pt very excited and states "this is so nice"  Upper Body Bathing: Minimal assistance;Sitting   Lower Body Bathing: Total assistance       Lower Body Dressing: Total assistance   Toilet Transfer: Moderate assistance(stedy) Toilet Transfer Details (indicate cue type and reason): pt able to pull up with bil Ue on stedy with mod (A) from standard chair height. pt able to sustain standing and alternating weight shift in stedy           General ADL Comments: pt requesting to transfer from chair back to bed. pt expressing his like for the stedy use. pt provided room phone number to give to family . pt appreciates OT session and wants more therapy prior to home     Vision Baseline Vision/History: Wears glasses Wears Glasses: At all times       Perception     Praxis      Pertinent Vitals/Pain Pain Assessment: No/denies pain     Hand Dominance Right   Extremity/Trunk Assessment Upper Extremity Assessment Upper  Extremity Assessment: Overall WFL for tasks assessed   Lower Extremity Assessment Lower Extremity Assessment: Defer to PT evaluation   Cervical / Trunk Assessment Cervical / Trunk Assessment: Other exceptions(s/p surg)   Communication Communication Communication: No difficulties   Cognition Arousal/Alertness: Awake/alert Behavior During Therapy: WFL for tasks assessed/performed Overall Cognitive Status: Within Functional Limits for tasks assessed                                 General  Comments: Aox3    General Comments  hemovac drain in place    Exercises     Shoulder Instructions      Home Living Family/patient expects to be discharged to:: Inpatient rehab Living Arrangements: Spouse/significant other   Type of Home: Apartment Home Access: Stairs to enter Entrance Stairs-Number of Steps: 3 Entrance Stairs-Rails: Can reach both Home Layout: One level     Bathroom Shower/Tub: Tub/shower unit         Home Equipment: Environmental consultant - 2 wheels;Tub bench          Prior Functioning/Environment Level of Independence: Needs assistance  Gait / Transfers Assistance Needed: Patient reports over the past few weeeks his wife helped him into his wheelchair and up the stairs  ADL's / Homemaking Assistance Needed: Wife made meals and assisted him with ADL's             OT Problem List: Decreased strength;Decreased activity tolerance;Impaired balance (sitting and/or standing);Decreased safety awareness;Decreased knowledge of use of DME or AE;Decreased knowledge of precautions      OT Treatment/Interventions: Self-care/ADL training;Therapeutic exercise;Neuromuscular education;Energy conservation;DME and/or AE instruction;Manual therapy;Therapeutic activities;Patient/family education;Balance training    OT Goals(Current goals can be found in the care plan section) Acute Rehab OT Goals Patient Stated Goal: to get therapy OT Goal Formulation: With patient Time For Goal Achievement: 03/24/19 Potential to Achieve Goals: Good  OT Frequency: Min 3X/week   Barriers to D/C:            Co-evaluation              AM-PAC OT "6 Clicks" Daily Activity     Outcome Measure Help from another person eating meals?: A Little Help from another person taking care of personal grooming?: A Little Help from another person toileting, which includes using toliet, bedpan, or urinal?: A Lot Help from another person bathing (including washing, rinsing, drying)?: A Lot Help from  another person to put on and taking off regular upper body clothing?: A Little Help from another person to put on and taking off regular lower body clothing?: Total 6 Click Score: 14   End of Session Equipment Utilized During Treatment: Gait belt Nurse Communication: Mobility status;Precautions  Activity Tolerance: Patient tolerated treatment well Patient left: in bed;with call bell/phone within reach;with bed alarm set  OT Visit Diagnosis: Unsteadiness on feet (R26.81);Muscle weakness (generalized) (M62.81)                Time: 6222-9798 OT Time Calculation (min): 19 min Charges:  OT General Charges $OT Visit: 1 Visit OT Evaluation $OT Eval Moderate Complexity: 1 Mod   Jeri Modena, OTR/L  Acute Rehabilitation Services Pager: [phone] Office: [phone] .   Jeri Modena 03/10/2019, 1:57 PM

## 2019-03-10 NOTE — Progress Notes (Signed)
Inpatient Rehabilitation Admissions Coordinator  Inpatient Rehab Consult received. I met with patient at the bedside for rehabilitation assessment. We discussed goals and expectations of an inpatient rehab admission.  He is an excellant candidate to admit to inpt rehab and he is in agreement. I will await medical work up completion to plan when pt medically ready to admit.   Danne Baxter, RN, MSN Rehab Admissions Coordinator [phone] 03/10/2019 11:45 AM

## 2019-03-10 NOTE — Progress Notes (Addendum)
FPTS Interim Progress Note  Spoke with patient's oncologist, Dr. Alen Blew.  Ensured that he was updated on patient's condition.  He recommended continuing with post-op care and radiation oncology would be involved with post-op radiation.  Plan to restart Zytiga, not on formulary, will need to contact significant other to ensure that this is delivered to hospital.  Update: patient reports that he is no longer taking Zytiga.   [place], DO 03/10/2019, 3:04 PM PGY-1, [facility] Medicine Service pager [phone]

## 2019-03-10 NOTE — Progress Notes (Signed)
  Echocardiogram 2D Echocardiogram has been performed.  Dennis Fischer 03/10/2019, 2:59 PM

## 2019-03-10 NOTE — Evaluation (Signed)
Physical Therapy Evaluation Patient Details Name: Dennis Fischer MRN: [id number] DOB: [birth year] Today's Date: 03/10/2019   History of Present Illness  Patient is a 73 year old male who presented to the hopsital folloing a progressive onset of weakness. Over the past month he has become weaker until the point where he was using a wheelchair for primary mobility. His wife has been assisting him. He was found to have a epidural tumor 2nd to prostate cancer in his spine. He had a  t-7,8,9 decompression on 03/09/2019. PMH : prostate cancer, metastatic bone caner, HTN  Clinical Impression  Patient reports an improvement in his baseline mobility but he requires significant assist for mobility. He required max a to transfer to the chair and had decreased coordination in his left leg with short distance ambulation. He reports his wife was helping him up the stairs at home. He will likely require significant assist for stairs. His best option at this time would be CIR to improve functional mobility and safety. Acute therapy will continue working with the patient to improve ability to transfer. If he dies decide to go home he will need stair training.     Follow Up Recommendations CIR;Home health PT    Equipment Recommendations  Rolling walker with 5" wheels    Recommendations for Other Services Rehab consult     Precautions / Restrictions Precautions Precautions: Fall Restrictions Weight Bearing Restrictions: No      Mobility  Bed Mobility Overal bed mobility: Needs Assistance Bed Mobility: Supine to Sit     Supine to sit: Mod assist     General bed mobility comments: mod a to the edge of the bed and mod a  to scoot forward. Mod cuing for log roll. Will need further education on log roll   Transfers Overall transfer level: Needs assistance Equipment used: Rolling walker (2 wheeled) Transfers: Sit to/from Stand Sit to Stand: Mod assist         General transfer comment: Mod a to  stand. Reports continued numbness in the patients feet. Max a to transfer to the bed.   Ambulation/Gait Ambulation/Gait assistance: Max assist Gait Distance (Feet): 3 Feet Assistive device: Rolling walker (2 wheeled)   Gait velocity: decreased   General Gait Details: decreased coordination in the left leg. Mod cuingt to stay in the walker and to  back up all the way to the chair.r  Stairs            Wheelchair Mobility    Modified Rankin (Stroke Patients Only)       Balance Overall balance assessment: Needs assistance Sitting-balance support: Single extremity supported;Feet supported Sitting balance-Leahy Scale: Fair Sitting balance - Comments: mild syncope sitting edge of the bed    Standing balance support: Bilateral upper extremity supported Standing balance-Leahy Scale: Good Standing balance comment: poor                              Pertinent Vitals/Pain Pain Assessment: No/denies pain(no pain even with movement)    Home Living Family/patient expects to be discharged to:: Inpatient rehab Living Arrangements: Spouse/significant other   Type of Home: Apartment Home Access: Stairs to enter Entrance Stairs-Rails: Can reach both Entrance Stairs-Number of Steps: 3 Home Layout: One level Home Equipment: Walker - 2 wheels;Tub bench      Prior Function Level of Independence: Needs assistance   Gait / Transfers Assistance Needed: Patient reports over the past few weeeks his wife  helped him into his wheelchair and up the stairs   ADL's / Homemaking Assistance Needed: Wife made meals and assisted him with ADL's         Hand Dominance   Dominant Hand: Right    Extremity/Trunk Assessment   Upper Extremity Assessment Upper Extremity Assessment: Defer to OT evaluation    Lower Extremity Assessment Lower Extremity Assessment: Generalized weakness;LLE deficits/detail LLE Deficits / Details: 3/5 hip flexion and abduction  LLE Coordination:  decreased gross motor       Communication   Communication: No difficulties  Cognition Arousal/Alertness: Awake/alert Behavior During Therapy: WFL for tasks assessed/performed Overall Cognitive Status: Within Functional Limits for tasks assessed                                 General Comments: Aox3       General Comments General comments (skin integrity, edema, etc.): drain in thoracic spine     Exercises     Assessment/Plan    PT Assessment Patient needs continued PT services  PT Problem List Decreased strength;Decreased range of motion;Decreased activity tolerance;Decreased mobility;Decreased balance;Decreased knowledge of use of DME;Decreased safety awareness;Decreased knowledge of precautions;Decreased coordination       PT Treatment Interventions DME instruction;Gait training;Stair training;Functional mobility training;Therapeutic activities;Therapeutic exercise;Neuromuscular re-education;Patient/family education    PT Goals (Current goals can be found in the Care Plan section)  Acute Rehab PT Goals Patient Stated Goal: to get stronger  PT Goal Formulation: With patient Time For Goal Achievement: 03/17/19 Potential to Achieve Goals: Good    Frequency Min 4X/week   Barriers to discharge        Co-evaluation               AM-PAC PT "6 Clicks" Mobility  Outcome Measure Help needed turning from your back to your side while in a flat bed without using bedrails?: A Lot Help needed moving from lying on your back to sitting on the side of a flat bed without using bedrails?: A Lot Help needed moving to and from a bed to a chair (including a wheelchair)?: A Lot Help needed standing up from a chair using your arms (e.g., wheelchair or bedside chair)?: A Lot Help needed to walk in hospital room?: Total Help needed climbing 3-5 steps with a railing? : Total 6 Click Score: 10    End of Session Equipment Utilized During Treatment: Gait belt Activity  Tolerance: Patient tolerated treatment well Patient left: in chair;with call bell/phone within reach Nurse Communication: Mobility status PT Visit Diagnosis: Muscle weakness (generalized) (M62.81);Other abnormalities of gait and mobility (R26.89)    Time: 8299-3716 PT Time Calculation (min) (ACUTE ONLY): 28 min   Charges:   PT Evaluation $PT Eval Moderate Complexity: 1 Mod             Carney Living PT DPT  03/10/2019, 9:52 AM

## 2019-03-11 ENCOUNTER — Ambulatory Visit
Admit: 2019-03-11 | Discharge: 2019-03-11 | Disposition: A | Discharge status: Home / Self Care | Payer: PRIVATE HEALTH INSURANCE | Attending: Radiation Oncology | Admitting: Radiation Oncology

## 2019-03-11 ENCOUNTER — Inpatient Hospital Stay (HOSPITAL_COMMUNITY)
Admission: RE | Admit: 2019-03-11 | Discharge: 2019-03-27 | DRG: 560 | Disposition: A | Discharge status: Home Health | Payer: Medicare Other | Source: Intra-hospital | Attending: Physical Medicine & Rehabilitation | Admitting: Physical Medicine & Rehabilitation

## 2019-03-11 ENCOUNTER — Encounter (HOSPITAL_COMMUNITY): Payer: Self-pay | Admitting: Physical Medicine and Rehabilitation

## 2019-03-11 ENCOUNTER — Telehealth: Payer: PRIVATE HEALTH INSURANCE | Admitting: Student in an Organized Health Care Education/Training Program

## 2019-03-11 DIAGNOSIS — G9520 Unspecified cord compression: Secondary | ICD-10-CM

## 2019-03-11 DIAGNOSIS — Z79899 Other long term (current) drug therapy: Secondary | ICD-10-CM | POA: Diagnosis not present

## 2019-03-11 DIAGNOSIS — D696 Thrombocytopenia, unspecified: Secondary | ICD-10-CM | POA: Diagnosis present

## 2019-03-11 DIAGNOSIS — D62 Acute posthemorrhagic anemia: Secondary | ICD-10-CM | POA: Diagnosis present

## 2019-03-11 DIAGNOSIS — Z91018 Allergy to other foods: Secondary | ICD-10-CM | POA: Diagnosis not present

## 2019-03-11 DIAGNOSIS — R799 Abnormal finding of blood chemistry, unspecified: Secondary | ICD-10-CM

## 2019-03-11 DIAGNOSIS — X58XXXA Exposure to other specified factors, initial encounter: Secondary | ICD-10-CM | POA: Diagnosis present

## 2019-03-11 DIAGNOSIS — C7952 Secondary malignant neoplasm of bone marrow: Secondary | ICD-10-CM | POA: Diagnosis not present

## 2019-03-11 DIAGNOSIS — C61 Malignant neoplasm of prostate: Secondary | ICD-10-CM | POA: Diagnosis present

## 2019-03-11 DIAGNOSIS — E441 Mild protein-calorie malnutrition: Secondary | ICD-10-CM | POA: Diagnosis present

## 2019-03-11 DIAGNOSIS — I10 Essential (primary) hypertension: Secondary | ICD-10-CM | POA: Diagnosis present

## 2019-03-11 DIAGNOSIS — R740 Nonspecific elevation of levels of transaminase and lactic acid dehydrogenase [LDH]: Secondary | ICD-10-CM | POA: Diagnosis not present

## 2019-03-11 DIAGNOSIS — R739 Hyperglycemia, unspecified: Secondary | ICD-10-CM | POA: Diagnosis present

## 2019-03-11 DIAGNOSIS — T380X5A Adverse effect of glucocorticoids and synthetic analogues, initial encounter: Secondary | ICD-10-CM | POA: Diagnosis present

## 2019-03-11 DIAGNOSIS — R410 Disorientation, unspecified: Secondary | ICD-10-CM | POA: Diagnosis not present

## 2019-03-11 DIAGNOSIS — Z888 Allergy status to other drugs, medicaments and biological substances status: Secondary | ICD-10-CM | POA: Diagnosis not present

## 2019-03-11 DIAGNOSIS — R944 Abnormal results of kidney function studies: Secondary | ICD-10-CM | POA: Diagnosis not present

## 2019-03-11 DIAGNOSIS — M792 Neuralgia and neuritis, unspecified: Secondary | ICD-10-CM | POA: Diagnosis not present

## 2019-03-11 DIAGNOSIS — R7401 Elevation of levels of liver transaminase levels: Secondary | ICD-10-CM

## 2019-03-11 DIAGNOSIS — Z87891 Personal history of nicotine dependence: Secondary | ICD-10-CM | POA: Diagnosis not present

## 2019-03-11 DIAGNOSIS — R0989 Other specified symptoms and signs involving the circulatory and respiratory systems: Secondary | ICD-10-CM

## 2019-03-11 DIAGNOSIS — Z9181 History of falling: Secondary | ICD-10-CM

## 2019-03-11 DIAGNOSIS — E46 Unspecified protein-calorie malnutrition: Secondary | ICD-10-CM

## 2019-03-11 DIAGNOSIS — Z91010 Allergy to peanuts: Secondary | ICD-10-CM | POA: Diagnosis not present

## 2019-03-11 DIAGNOSIS — G8220 Paraplegia, unspecified: Secondary | ICD-10-CM | POA: Diagnosis not present

## 2019-03-11 DIAGNOSIS — Z6825 Body mass index (BMI) 25.0-25.9, adult: Secondary | ICD-10-CM

## 2019-03-11 DIAGNOSIS — K592 Neurogenic bowel, not elsewhere classified: Secondary | ICD-10-CM | POA: Diagnosis present

## 2019-03-11 DIAGNOSIS — C7951 Secondary malignant neoplasm of bone: Secondary | ICD-10-CM

## 2019-03-11 DIAGNOSIS — Z7952 Long term (current) use of systemic steroids: Secondary | ICD-10-CM | POA: Diagnosis not present

## 2019-03-11 DIAGNOSIS — Z4789 Encounter for other orthopedic aftercare: Principal | ICD-10-CM

## 2019-03-11 DIAGNOSIS — N319 Neuromuscular dysfunction of bladder, unspecified: Secondary | ICD-10-CM | POA: Diagnosis present

## 2019-03-11 DIAGNOSIS — Z7982 Long term (current) use of aspirin: Secondary | ICD-10-CM

## 2019-03-11 DIAGNOSIS — G629 Polyneuropathy, unspecified: Secondary | ICD-10-CM | POA: Diagnosis present

## 2019-03-11 DIAGNOSIS — R531 Weakness: Secondary | ICD-10-CM | POA: Diagnosis not present

## 2019-03-11 DIAGNOSIS — R278 Other lack of coordination: Secondary | ICD-10-CM | POA: Diagnosis present

## 2019-03-11 DIAGNOSIS — E8809 Other disorders of plasma-protein metabolism, not elsewhere classified: Secondary | ICD-10-CM | POA: Diagnosis present

## 2019-03-11 DIAGNOSIS — K5900 Constipation, unspecified: Secondary | ICD-10-CM | POA: Diagnosis present

## 2019-03-11 DIAGNOSIS — R296 Repeated falls: Secondary | ICD-10-CM | POA: Diagnosis present

## 2019-03-11 DIAGNOSIS — M48061 Spinal stenosis, lumbar region without neurogenic claudication: Secondary | ICD-10-CM | POA: Diagnosis not present

## 2019-03-11 DIAGNOSIS — G959 Disease of spinal cord, unspecified: Secondary | ICD-10-CM | POA: Diagnosis present

## 2019-03-11 DIAGNOSIS — Z8042 Family history of malignant neoplasm of prostate: Secondary | ICD-10-CM

## 2019-03-11 LAB — CBC
HCT: 23.4 % — ABNORMAL LOW (ref 39.0–52.0)
HCT: 27.6 % — ABNORMAL LOW (ref 39.0–52.0)
Hemoglobin: 8 g/dL — ABNORMAL LOW (ref 13.0–17.0)
Hemoglobin: 9.3 g/dL — ABNORMAL LOW (ref 13.0–17.0)
MCH: 30 pg (ref 26.0–34.0)
MCH: 29.6 pg (ref 26.0–34.0)
MCHC: 34.2 g/dL (ref 30.0–36.0)
MCHC: 33.7 g/dL (ref 30.0–36.0)
MCV: 87.6 fL (ref 80.0–100.0)
MCV: 87.9 fL (ref 80.0–100.0)
Platelets: 186 10*3/uL (ref 150–400)
Platelets: 232 10*3/uL (ref 150–400)
RBC: 2.67 MIL/uL — ABNORMAL LOW (ref 4.22–5.81)
RBC: 3.14 MIL/uL — ABNORMAL LOW (ref 4.22–5.81)
RDW: 18.1 % — ABNORMAL HIGH (ref 11.5–15.5)
RDW: 18.2 % — ABNORMAL HIGH (ref 11.5–15.5)
WBC: 9.8 10*3/uL (ref 4.0–10.5)
WBC: 11.7 10*3/uL — ABNORMAL HIGH (ref 4.0–10.5)
nRBC: 0 % (ref 0.0–0.2)
nRBC: 0 % (ref 0.0–0.2)

## 2019-03-11 LAB — BASIC METABOLIC PANEL
Anion gap: 9 (ref 5–15)
BUN: 19 mg/dL (ref 8–23)
CO2: 23 mmol/L (ref 22–32)
Calcium: 8.6 mg/dL — ABNORMAL LOW (ref 8.9–10.3)
Chloride: 108 mmol/L (ref 98–111)
Creatinine, Ser: 0.98 mg/dL (ref 0.61–1.24)
GFR calc Af Amer: >60 mL/min (ref >60)
GFR calc non Af Amer: >60 mL/min (ref >60)
Glucose, Bld: 170 mg/dL — ABNORMAL HIGH (ref 70–99)
Potassium: 3.7 mmol/L (ref 3.5–5.1)
Sodium: 140 mmol/L (ref 135–145)

## 2019-03-11 LAB — GLUCOSE, CAPILLARY
Glucose-Capillary: 158 mg/dL — ABNORMAL HIGH (ref 70–99)
Glucose-Capillary: 160 mg/dL — ABNORMAL HIGH (ref 70–99)
Glucose-Capillary: 168 mg/dL — ABNORMAL HIGH (ref 70–99)
Glucose-Capillary: 149 mg/dL — ABNORMAL HIGH (ref 70–99)

## 2019-03-11 MED ORDER — TRAZODONE HCL 50 MG PO TABS: 25.0000 mg | ORAL_TABLET | Freq: Every evening | ORAL | Status: DC | PRN

## 2019-03-11 MED ORDER — AMLODIPINE BESYLATE 10 MG PO TABS
10.0000 mg | ORAL_TABLET | Freq: Every day | ORAL | Status: DC
  Administered 2019-03-12 – 2019-03-27 (×16): 10 mg via ORAL
  Filled 2019-03-11 (×16): qty 1

## 2019-03-11 MED ORDER — CYCLOBENZAPRINE HCL 10 MG PO TABS: 10.0000 mg | ORAL_TABLET | Freq: Three times a day (TID) | ORAL | Status: DC | PRN

## 2019-03-11 MED ORDER — GUAIFENESIN-DM 100-10 MG/5ML PO SYRP: 5.0000 mL | ORAL_SOLUTION | Freq: Four times a day (QID) | ORAL | Status: DC | PRN

## 2019-03-11 MED ORDER — OXYBUTYNIN CHLORIDE ER 10 MG PO TB24
10.0000 mg | ORAL_TABLET | Freq: Every day | ORAL | Status: DC
  Administered 2019-03-11 – 2019-03-19 (×9): 10 mg via ORAL
  Filled 2019-03-11 (×9): qty 1

## 2019-03-11 MED ORDER — BISACODYL 10 MG RE SUPP
10.0000 mg | Freq: Every day | RECTAL | Status: DC | PRN
  Administered 2019-03-20 – 2019-03-23 (×2): 10 mg via RECTAL
  Filled 2019-03-11 (×2): qty 1

## 2019-03-11 MED ORDER — PROCHLORPERAZINE 25 MG RE SUPP: 12.5000 mg | Freq: Four times a day (QID) | RECTAL | Status: DC | PRN

## 2019-03-11 MED ORDER — DEXAMETHASONE 2 MG PO TABS
10.0000 mg | ORAL_TABLET | Freq: Four times a day (QID) | ORAL | Status: DC
  Administered 2019-03-11 – 2019-03-12 (×3): 10 mg via ORAL
  Filled 2019-03-11 (×3): qty 5

## 2019-03-11 MED ORDER — HYDROCODONE-ACETAMINOPHEN 5-325 MG PO TABS: 1.0000 | ORAL_TABLET | ORAL | Status: DC | PRN

## 2019-03-11 MED ORDER — HYDROCODONE-ACETAMINOPHEN 10-325 MG PO TABS: 2.0000 | ORAL_TABLET | ORAL | 0 refills | Status: DC | PRN

## 2019-03-11 MED ORDER — ACETAMINOPHEN 325 MG PO TABS: 325.0000 mg | ORAL_TABLET | ORAL | Status: DC | PRN

## 2019-03-11 MED ORDER — SIMVASTATIN 20 MG PO TABS
20.0000 mg | ORAL_TABLET | Freq: Every day | ORAL | Status: DC
  Administered 2019-03-12 – 2019-03-26 (×15): 20 mg via ORAL
  Filled 2019-03-11 (×15): qty 1

## 2019-03-11 MED ORDER — MENTHOL 3 MG MT LOZG: 1.0000 | LOZENGE | OROMUCOSAL | Status: DC | PRN

## 2019-03-11 MED ORDER — HYDROCODONE-ACETAMINOPHEN 10-325 MG PO TABS: 2.0000 | ORAL_TABLET | ORAL | Status: DC | PRN

## 2019-03-11 MED ORDER — PANTOPRAZOLE SODIUM 40 MG PO TBEC: 40.0000 mg | DELAYED_RELEASE_TABLET | Freq: Every day | ORAL | Status: DC

## 2019-03-11 MED ORDER — PROCHLORPERAZINE MALEATE 5 MG PO TABS: 5.0000 mg | ORAL_TABLET | Freq: Four times a day (QID) | ORAL | Status: DC | PRN

## 2019-03-11 MED ORDER — PANTOPRAZOLE SODIUM 40 MG PO TBEC
40.0000 mg | DELAYED_RELEASE_TABLET | Freq: Every day | ORAL | Status: DC
  Administered 2019-03-12 – 2019-03-27 (×16): 40 mg via ORAL
  Filled 2019-03-11 (×16): qty 1

## 2019-03-11 MED ORDER — POLYETHYLENE GLYCOL 3350 17 G PO PACK
17.0000 g | PACK | Freq: Every day | ORAL | Status: DC | PRN
  Administered 2019-03-14 – 2019-03-19 (×2): 17 g via ORAL
  Filled 2019-03-11 (×2): qty 1

## 2019-03-11 MED ORDER — SODIUM CHLORIDE 0.9% FLUSH
10.0000 mL | INTRAVENOUS | Status: DC | PRN
  Administered 2019-03-20 – 2019-03-27 (×2): 10 mL
  Filled 2019-03-11 (×2): qty 40

## 2019-03-11 MED ORDER — GABAPENTIN 300 MG PO CAPS
300.0000 mg | ORAL_CAPSULE | Freq: Every day | ORAL | Status: DC
  Administered 2019-03-11 – 2019-03-26 (×16): 300 mg via ORAL
  Filled 2019-03-11 (×16): qty 1

## 2019-03-11 MED ORDER — ALUM & MAG HYDROXIDE-SIMETH 200-200-20 MG/5ML PO SUSP: 30.0000 mL | ORAL | Status: DC | PRN

## 2019-03-11 MED ORDER — PHENOL 1.4 % MT LIQD: 1.0000 | OROMUCOSAL | Status: DC | PRN

## 2019-03-11 MED ORDER — ONDANSETRON HCL 4 MG PO TABS: 4.0000 mg | ORAL_TABLET | Freq: Four times a day (QID) | ORAL | Status: DC | PRN

## 2019-03-11 MED ORDER — FLEET ENEMA 7-19 GM/118ML RE ENEM: 1.0000 | ENEMA | Freq: Once | RECTAL | Status: DC | PRN

## 2019-03-11 MED ORDER — ONDANSETRON HCL 4 MG/2ML IJ SOLN: 4.0000 mg | Freq: Four times a day (QID) | INTRAMUSCULAR | 0 refills | Status: DC | PRN

## 2019-03-11 MED ORDER — SODIUM CHLORIDE 0.9% FLUSH
10.0000 mL | Freq: Two times a day (BID) | INTRAVENOUS | Status: DC
  Administered 2019-03-11 – 2019-03-25 (×10): 10 mL

## 2019-03-11 MED ORDER — PROCHLORPERAZINE EDISYLATE 10 MG/2ML IJ SOLN: 5.0000 mg | Freq: Four times a day (QID) | INTRAMUSCULAR | Status: DC | PRN

## 2019-03-11 MED ORDER — PROCHLORPERAZINE MALEATE 5 MG PO TABS: 10.0000 mg | ORAL_TABLET | Freq: Four times a day (QID) | ORAL | Status: DC | PRN

## 2019-03-11 MED ORDER — DIPHENHYDRAMINE HCL 12.5 MG/5ML PO ELIX: 12.5000 mg | ORAL_SOLUTION | Freq: Four times a day (QID) | ORAL | Status: DC | PRN

## 2019-03-11 MED ORDER — ONDANSETRON HCL 4 MG PO TABS: 4.0000 mg | ORAL_TABLET | Freq: Four times a day (QID) | ORAL | 0 refills | Status: DC | PRN

## 2019-03-11 MED ORDER — CYCLOBENZAPRINE HCL 10 MG PO TABS: 10.0000 mg | ORAL_TABLET | Freq: Three times a day (TID) | ORAL | 0 refills | Status: DC | PRN

## 2019-03-11 MED ORDER — OXYBUTYNIN CHLORIDE ER 10 MG PO TB24: 10.0000 mg | ORAL_TABLET | Freq: Every day | ORAL | Status: DC

## 2019-03-11 MED ORDER — CARVEDILOL 12.5 MG PO TABS
12.5000 mg | ORAL_TABLET | Freq: Two times a day (BID) | ORAL | Status: DC
  Administered 2019-03-12 – 2019-03-27 (×31): 12.5 mg via ORAL
  Filled 2019-03-11 (×31): qty 1

## 2019-03-11 MED ORDER — HYDROCODONE-ACETAMINOPHEN 5-325 MG PO TABS: 1.0000 | ORAL_TABLET | ORAL | 0 refills | Status: DC | PRN

## 2019-03-11 MED ORDER — ONDANSETRON HCL 4 MG/2ML IJ SOLN: 4.0000 mg | Freq: Four times a day (QID) | INTRAMUSCULAR | Status: DC | PRN

## 2019-03-11 MED ORDER — DEXAMETHASONE SODIUM PHOSPHATE 10 MG/ML IJ SOLN
10.0000 mg | Freq: Four times a day (QID) | INTRAMUSCULAR | Status: DC
  Filled 2019-03-11 (×2): qty 1

## 2019-03-11 NOTE — Progress Notes (Signed)
Patient for transferred to rehab.  IV discontinued.  Patient ready fot therapy in rehab.  Report given to RN receiving patient.  Patient comfortable,  No complaint of pain.

## 2019-03-11 NOTE — PMR Pre-admission (Signed)
PMR Admission Coordinator Pre-Admission Assessment  Patient: Dennis Fischer is an 73 y.o., male MRN: [id number] DOB: [birth year] Height: 6' (182.9 cm) Weight: 88.4 kg  Insurance Information HMO:     PPO:      PCP:      IPA:      80/20:      OTHER: no HMO PRIMARY: Medicare a and b      Policy#: [id number]      Subscriber: pt Benefits:  Phone #: passport one online     Name: 03/11/2019 Eff. Date: a 07/12/2011 b     Deduct: $1408      Out of Pocket Max: none      Life Max: none CIR: 100%      SNF: 20 full days Outpatient: 80%     Co-Pay: 20% Home Health: 100%      Co-Pay: none DME: 80%     Co-Pay: 20% Providers: pt choice  SECONDARY: AARP Medicare supplement      Policy#: [id number]      Subscriber: pt  Medicaid Application Date:       Case Manager:  Disability Application Date:       Case Worker:   The "Data Collection Information Summary" for patients in Inpatient Rehabilitation Facilities with attached "Privacy Act [place] Records" was provided and verbally reviewed with: Patient  Emergency Contact Information Contact Information    Name Relation Home Work Mobile   Gavin,[name] Significant other [phone]        Current Medical History  Patient Admitting Diagnosis: spinal cord compression s/p thoracic laminectomy secondary to prostate Cancer  History of Present Illness: 73 year old male with castration resistant prostate cancer. Past medial history of stage IV prostate cancer, anemia, heart failure, some urinary incontinence with bladder spasms, headaches, GERD, HDL and prediabetes. History of bilateral total hip replacement, HTN retinopathy. Presented on 6/29 to [facility] and transferred to [facility] for surgery. Progressive bilateral lower extremity weakness. Changes in sensation form his mid chest distally. He was Mod I then RW to now over past few weeks wheelchair dependent. Does have bladder control. He was diagnosed with neuropathy but symptoms  progressive. MRI cervical and thoracic demonstrated evidence of extensive metastatic disease to his thoracic spine with moderate spinal stenosis at T4. At T8 evidence of severe dorsal epidural tumor with marked cord compression. Neurosurgery consulted.  To OR with Dr. Annette Stable on 6/29 for T7-T8-T9 decompressive laminectomy with resection of epidural tumor, microdissection. Postoperatively held Asa, dexamethasone per neurosurgery to monitor diabetes with CBGS due to steroids. Home meds with oxybutynin to continue. Sensation and strength improved postop. Drain to be removed today.   Oncologist Dr. Alen Blew updated. Patient no longer on Zytiga. Radiation Oncology consulted. Patient as outpatient was scheduled for his 4th out of 6th treatments on 7/21 of his Trudi Ida. Patient with history of radiation treatment for prostate 2016. Plans for two additional weeks of healing postoperatively and then plan 10 fraction course of palliative radiation.  Patient noted cocaine on admission.    Patient's medical record from [facility]  has been reviewed by the rehabilitation admission coordinator and physician.  Past Medical History  Past Medical History:  Diagnosis Date  . Hypertension   . Metastatic cancer to bone ([place]) dx'd 2018  . Prostate cancer [facility])     Family History   family history includes Prostate cancer in his father.  Prior Rehab/Hospitalizations Has the patient had prior rehab or hospitalizations prior to admission? Yes  Has the patient had major surgery during 100 days prior to admission? Yes   Current Medications  Current Facility-Administered Medications:  .  0.9 %  sodium chloride infusion, 250 mL, Intravenous, Continuous, Pool, Mallie Mussel, MD, Last Rate: 1 mL/hr at 03/10/19 0117, 250 mL at 03/10/19 0117 .  acetaminophen (TYLENOL) tablet 650 mg, 650 mg, Oral, Q4H PRN **OR** acetaminophen (TYLENOL) suppository 650 mg, 650 mg, Rectal, Q4H PRN, Earnie Larsson, MD .  amLODipine (NORVASC)  tablet 10 mg, 10 mg, Oral, Daily, Matilde Haymaker, MD, 10 mg at 03/11/19 0905 .  carvedilol (COREG) tablet 12.5 mg, 12.5 mg, Oral, BID WC, Matilde Haymaker, MD, 12.5 mg at 03/11/19 7322 .  cyclobenzaprine (FLEXERIL) tablet 10 mg, 10 mg, Oral, TID PRN, Earnie Larsson, MD .  dexamethasone (DECADRON) injection 10 mg, 10 mg, Intravenous, Q6H, Earnie Larsson, MD, 10 mg at 03/11/19 0254 .  gabapentin (NEURONTIN) capsule 300 mg, 300 mg, Oral, QHS, Matilde Haymaker, MD, 300 mg at 03/10/19 2103 .  HYDROcodone-acetaminophen (NORCO) 10-325 MG per tablet 2 tablet, 2 tablet, Oral, Q4H PRN, Earnie Larsson, MD .  HYDROcodone-acetaminophen (NORCO/VICODIN) 5-325 MG per tablet 1 tablet, 1 tablet, Oral, Q4H PRN, Earnie Larsson, MD, 1 tablet at 03/10/19 0131 .  HYDROmorphone (DILAUDID) injection 1 mg, 1 mg, Intravenous, Q2H PRN, Pool, Mallie Mussel, MD .  menthol-cetylpyridinium (CEPACOL) lozenge 3 mg, 1 lozenge, Oral, PRN **OR** phenol (CHLORASEPTIC) mouth spray 1 spray, 1 spray, Mouth/Throat, PRN, Pool, Mallie Mussel, MD .  ondansetron (ZOFRAN) tablet 4 mg, 4 mg, Oral, Q6H PRN **OR** ondansetron (ZOFRAN) injection 4 mg, 4 mg, Intravenous, Q6H PRN, Earnie Larsson, MD .  oxybutynin (DITROPAN-XL) 24 hr tablet 10 mg, 10 mg, Oral, QHS, Pool, Mallie Mussel, MD, 10 mg at 03/10/19 2103 .  pantoprazole (PROTONIX) EC tablet 40 mg, 40 mg, Oral, Daily, Matilde Haymaker, MD, 40 mg at 03/11/19 0905 .  prochlorperazine (COMPAZINE) tablet 10 mg, 10 mg, Oral, Q6H PRN, Earnie Larsson, MD .  simvastatin (ZOCOR) tablet 20 mg, 20 mg, Oral, q1800, Matilde Haymaker, MD, 20 mg at 03/10/19 1753 .  sodium chloride flush (NS) 0.9 % injection 3 mL, 3 mL, Intravenous, Q12H, Earnie Larsson, MD, 3 mL at 03/10/19 2217 .  sodium chloride flush (NS) 0.9 % injection 3 mL, 3 mL, Intravenous, PRN, Earnie Larsson, MD  Patients Current Diet:  Diet Order            Diet regular Room service appropriate? Yes; Fluid consistency: Thin  Diet effective now              Precautions /  Restrictions Precautions Precautions: Fall Restrictions Weight Bearing Restrictions: No   Has the patient had 2 or more falls or a fall with injury in the past year? No  Prior Activity Level Limited Community (1-2x/wk): progressive weaknes over past month to wheelchair level. Months ago used no AD, progressed to cane and eventual wheelchair level over past few weeks. Weakness first noted 4/20 after most recent trial of chemotherapy.  Prior Functional Level Self Care: Did the patient need help bathing, dressing, using the toilet or eating? Needed some help  Indoor Mobility: Did the patient need assistance with walking from room to room (with or without device)? Needed some help  Stairs: Did the patient need assistance with internal or external stairs (with or without device)? Needed some help  Functional Cognition: Did the patient need help planning regular tasks such as shopping or remembering to take medications? Pharmacist, hospital / [place] Devices/Equipment: Wheelchair Home  Equipment: Walker - 2 wheels, Tub bench  Prior Device Use: Indicate devices/aids used by the patient prior to current illness, exacerbation or injury? Manual wheelchair and 1  Current Functional Level Cognition  Overall Cognitive Status: Within Functional Limits for tasks assessed Orientation Level: Oriented X4 General Comments: Aox3     Extremity Assessment (includes Sensation/Coordination)  Upper Extremity Assessment: Overall WFL for tasks assessed  Lower Extremity Assessment: Defer to PT evaluation LLE Deficits / Details: 3/5 hip flexion and abduction  LLE Coordination: decreased gross motor    ADLs  Overall ADL's : Needs assistance/impaired Eating/Feeding: Set up, Sitting Grooming: Oral care, Wash/dry face, Set up, Sitting Grooming Details (indicate cue type and reason): using stedy at sink level and washing hands for the first time in over a month. pt very  excited and states "this is so nice"  Upper Body Bathing: Minimal assistance, Sitting Lower Body Bathing: Total assistance Lower Body Dressing: Total assistance Toilet Transfer: Moderate assistance(stedy) Toilet Transfer Details (indicate cue type and reason): pt able to pull up with bil Ue on stedy with mod (A) from standard chair height. pt able to sustain standing and alternating weight shift in stedy General ADL Comments: pt requesting to transfer from chair back to bed. pt expressing his like for the stedy use. pt provided room phone number to give to family . pt appreciates OT session and wants more therapy prior to home    Mobility  Overal bed mobility: Needs Assistance Bed Mobility: Sit to Supine Supine to sit: Mod assist Sit to supine: Mod assist General bed mobility comments: requires mod cues for sequences and mod (A) to elevate bil le onto bed surface    Transfers  Overall transfer level: Needs assistance Equipment used: Rolling walker (2 wheeled) Transfer via Lift Equipment: Stedy Transfers: Sit to/from Stand Sit to Stand: Mod assist General transfer comment: pt able to sequence task and use stedy . pt sustain position in stedy for >10 minutes    Ambulation / Gait / Stairs / Wheelchair Mobility  Ambulation/Gait Ambulation/Gait assistance: Max Web designer (Feet): 3 Feet Assistive device: Rolling walker (2 wheeled) General Gait Details: decreased coordination in the left leg. Mod cuingt to stay in the walker and to  back up all the way to the chair.r Gait velocity: decreased    Posture / Balance Dynamic Sitting Balance Sitting balance - Comments: mild syncope sitting edge of the bed  Balance Overall balance assessment: Needs assistance Sitting-balance support: Single extremity supported, Feet supported Sitting balance-Leahy Scale: Fair Sitting balance - Comments: mild syncope sitting edge of the bed  Standing balance support: Bilateral upper extremity  supported Standing balance-Leahy Scale: Good Standing balance comment: reliant on BIL UE support    Special needs/care consideration BiPAP/CPAP  N/a CPM  N/a Continuous Drip IV has porto cath Dialysis  N/a Life Vest  N/a Oxygen  N/a Special Bed  N/a Trach Size  N/a Wound Vac n/a Skin surgical incision; surgical with 10 french to posterior back Bowel mgmt:  LBM none documented Bladder mgmt:  External catheter Diabetic mgmt: prediabetes pta Behavioral consideration  N/a Chemo/radiation history of radiation 2016; currently taking chemo, plans for additional radiation after 2 weeks of postop healing   Previous Home Environment  Living Arrangements: Spouse/significant other([name] girlfriend of 11 years)  Lives With: Significant other([name]) Available Help at Discharge: Family, Available 24 hours/day([name]) Type of Home: Apartment Home Layout: One level Home Access: Stairs to enter Entrance Stairs-Rails: Can reach both Entrance Stairs-Number of Steps:  3 Bathroom Shower/Tub: Optometrist: Yes How Accessible: Accessible via walker Home Care Services: No  Discharge Living Setting Plans for Discharge Living Setting: Patient's home, Apartment, Lives with (comment)([name], girlfriend of 11 years) Type of Home at Discharge: Apartment Discharge Home Layout: One level Discharge Home Access: Stairs to enter Entrance Stairs-Rails: Right, Left, Can reach both Entrance Stairs-Number of Steps: 3 Discharge Bathroom Shower/Tub: Tub/shower unit, Curtain Discharge Bathroom Toilet: Standard Discharge Bathroom Accessibility: Yes How Accessible: Accessible via walker Does the patient have any problems obtaining your medications?: No  Social/Family/Support Systems Patient Roles: Parent, Partner Contact Information: [name], girlfriend Anticipated Caregiver: [name] Anticipated Caregiver's Contact Information: [phone] Ability/Limitations of  Caregiver: no limitations Caregiver Availability: 24/7 Discharge Plan Discussed with Primary Caregiver: Yes Is Caregiver In Agreement with Plan?: Yes Does Caregiver/Family have Issues with Lodging/Transportation while Pt is in Rehab?: No  Goals/Additional Needs Patient/Family Goal for Rehab: supervision to min assist with PT and OT Expected length of stay: ELOS 10 to 14 days Additional Information: Patient and [name] were making arrnagments to move to another apartment before this; those plans are on hold Pt/Family Agrees to Admission and willing to participate: Yes Program Orientation Provided & Reviewed with Pt/Caregiver Including Roles  & Responsibilities: Yes  Decrease burden of Care through IP rehab admission: n/a  Possible need for SNF placement upon discharge:  Potentially  Patient Condition: I have reviewed medical records from [facility] , spoken with CM, and patient and spouse. I met with patient at the bedside for inpatient rehabilitation assessment.  Patient will benefit from ongoing PT and OT, can actively participate in 3 hours of therapy a day 5 days of the week, and can make measurable gains during the admission.  Patient will also benefit from the coordinated team approach during an Inpatient Acute Rehabilitation admission.  The patient will receive intensive therapy as well as Rehabilitation physician, nursing, social worker, and care management interventions.  Due to bladder management, bowel management, safety, skin/wound care, disease management, medication administration, pain management and patient education the patient requires 24 hour a day rehabilitation nursing.  The patient is currently mod to max assist with mobility and basic ADLs.  Discharge setting and therapy post discharge at home with home health is anticipated.  Patient has agreed to participate in the Acute Inpatient Rehabilitation Program and will admit today.  Preadmission Screen Completed By:   Cleatrice Burke, RN MSN 03/11/2019 11:49 AM ______________________________________________________________________   Discussed status with Dr. Posey Pronto  on  03/11/2019 at  1221 and received approval for admission today.  Admission Coordinator:  Cleatrice Burke, RN, time  1221 Date  03/11/2019   Assessment/Plan: Diagnosis: spinal cord compression s/p thoracic laminectomy secondary to prostate Cancer  1. Does the need for close, 24 hr/day Medical supervision in concert with the patient's rehab needs make it unreasonable for this patient to be served in a less intensive setting? Potentially  2. Co-Morbidities requiring supervision/potential complications: stage IV prostate cancer, anemia, heart failure, some urinary incontinence with bladder spasms, headaches, GERD, HDL and prediabetes 3. Due to bladder management, bowel management, safety, skin/wound care, disease management, pain management and patient education, does the patient require 24 hr/day rehab nursing? Yes 4. Does the patient require coordinated care of a physician, rehab nurse, PT (1-2 hrs/day, 5 days/week) and OT (1-2 hrs/day, 5 days/week) to address physical and functional deficits in the context of the above medical diagnosis(es)? Potentially Addressing deficits in the following areas: balance, endurance,  locomotion, strength, transferring, bathing, dressing, toileting and psychosocial support 5. Can the patient actively participate in an intensive therapy program of at least 3 hrs of therapy 5 days a week? Yes 6. The potential for patient to make measurable gains while on inpatient rehab is excellent 7. Anticipated functional outcomes upon discharge from inpatients are: min assist and mod assist PT, min assist and mod assist OT, n/a SLP 8. Estimated rehab length of stay to reach the above functional goals is: 14-17 days. 9. Anticipated D/C setting: Home 10. Anticipated post D/C treatments: HH therapy and Home excercise  program 11. Overall Rehab/Functional Prognosis: good  MD Signature: Delice Lesch, MD, ABPMR

## 2019-03-11 NOTE — Progress Notes (Signed)
Neurosurgery Service Progress Note  Subjective: No acute events overnight, some back pain but otherwise no complaints, legs significantly improved from preop   Objective: Vitals:   03/10/19 1926 03/11/19 0100 03/11/19 0317 03/11/19 0828  BP: 108/85 124/74 129/77 133/79  Pulse: 88 82 76 70  Resp: 15 18 18 17   Temp: 98.8 F (37.1 C) 98 F (36.7 C) 98.4 F (36.9 C) 97.9 F (36.6 C)  TempSrc: Oral Oral Oral Oral  SpO2: 98% 98% 97% 98%  Weight:      Height:       Temp (24hrs), Avg:98.6 F (37 C), Min:97.9 F (36.6 C), Max:99.4 F (37.4 C)  CBC Latest Ref Rng & Units 03/11/2019 03/10/2019 02/17/2019  WBC 4.0 - 10.5 K/uL 9.8 8.2 4.1  Hemoglobin 13.0 - 17.0 g/dL 8.0(L) 10.1(L) 12.0(L)  Hematocrit 39.0 - 52.0 % 23.4(L) 30.2(L) 36.9(L)  Platelets 150 - 400 K/uL 186 221 245   BMP Latest Ref Rng & Units 03/11/2019 03/10/2019 01/13/2019  Glucose 70 - 99 mg/dL 170(H) 211(H) 111(H)  BUN 8 - 23 mg/dL 19 11 17   Creatinine 0.61 - 1.24 mg/dL 0.98 1.14 1.11  Sodium 135 - 145 mmol/L 140 137 141  Potassium 3.5 - 5.1 mmol/L 3.7 4.0 4.0  Chloride 98 - 111 mmol/L 108 102 108  CO2 22 - 32 mmol/L 23 22 23   Calcium 8.9 - 10.3 mg/dL 8.6(L) 8.7(L) 9.0    Intake/Output Summary (Last 24 hours) at 03/11/2019 1039 Last data filed at 03/11/2019 0650 Gross per 24 hour  Intake 390 ml  Output 2950 ml  Net -2560 ml    Current Facility-Administered Medications:  .  0.9 %  sodium chloride infusion, 250 mL, Intravenous, Continuous, Pool, Mallie Mussel, MD, Last Rate: 1 mL/hr at 03/10/19 0117, 250 mL at 03/10/19 0117 .  acetaminophen (TYLENOL) tablet 650 mg, 650 mg, Oral, Q4H PRN **OR** acetaminophen (TYLENOL) suppository 650 mg, 650 mg, Rectal, Q4H PRN, Earnie Larsson, MD .  amLODipine (NORVASC) tablet 10 mg, 10 mg, Oral, Daily, Matilde Haymaker, MD, 10 mg at 03/11/19 0905 .  carvedilol (COREG) tablet 12.5 mg, 12.5 mg, Oral, BID WC, Matilde Haymaker, MD, 12.5 mg at 03/11/19 7341 .  cyclobenzaprine (FLEXERIL) tablet 10 mg, 10 mg, Oral,  TID PRN, Earnie Larsson, MD .  dexamethasone (DECADRON) injection 10 mg, 10 mg, Intravenous, Q6H, Earnie Larsson, MD, 10 mg at 03/11/19 9379 .  gabapentin (NEURONTIN) capsule 300 mg, 300 mg, Oral, QHS, Matilde Haymaker, MD, 300 mg at 03/10/19 2103 .  HYDROcodone-acetaminophen (NORCO) 10-325 MG per tablet 2 tablet, 2 tablet, Oral, Q4H PRN, Earnie Larsson, MD .  HYDROcodone-acetaminophen (NORCO/VICODIN) 5-325 MG per tablet 1 tablet, 1 tablet, Oral, Q4H PRN, Earnie Larsson, MD, 1 tablet at 03/10/19 0131 .  HYDROmorphone (DILAUDID) injection 1 mg, 1 mg, Intravenous, Q2H PRN, Pool, Mallie Mussel, MD .  menthol-cetylpyridinium (CEPACOL) lozenge 3 mg, 1 lozenge, Oral, PRN **OR** phenol (CHLORASEPTIC) mouth spray 1 spray, 1 spray, Mouth/Throat, PRN, Pool, Mallie Mussel, MD .  ondansetron (ZOFRAN) tablet 4 mg, 4 mg, Oral, Q6H PRN **OR** ondansetron (ZOFRAN) injection 4 mg, 4 mg, Intravenous, Q6H PRN, Earnie Larsson, MD .  oxybutynin (DITROPAN-XL) 24 hr tablet 10 mg, 10 mg, Oral, QHS, Pool, Mallie Mussel, MD, 10 mg at 03/10/19 2103 .  pantoprazole (PROTONIX) EC tablet 40 mg, 40 mg, Oral, Daily, Matilde Haymaker, MD, 40 mg at 03/11/19 0905 .  prochlorperazine (COMPAZINE) tablet 10 mg, 10 mg, Oral, Q6H PRN, Earnie Larsson, MD .  simvastatin (ZOCOR) tablet 20 mg, 20 mg, Oral,  [id number], Matilde Haymaker, MD, 20 mg at 03/10/19 1753 .  sodium chloride flush (NS) 0.9 % injection 3 mL, 3 mL, Intravenous, Q12H, Earnie Larsson, MD, 3 mL at 03/10/19 2217 .  sodium chloride flush (NS) 0.9 % injection 3 mL, 3 mL, Intravenous, PRN, Earnie Larsson, MD   Physical Exam: AOx3, PERRL, EOMI, FS, Strength 4/5 in BLE, 5/5 in BUE, baseline stocking/glove numbness with some mildly dec'd sensation in the BLE diffusely Drain w/ serosang output  Assessment & Plan: 73 y.o. man s/p thoracic lami for spinal cord compression 2/2 prostate Ca, recovering well.  -d/c drain -can d/c dex -okay with transfer to CIR whenever primary team feels is appropriate  Judith Part  03/11/19 10:39  AM

## 2019-03-11 NOTE — H&P (Addendum)
Physical Medicine and Rehabilitation Admission H&P    Chief Complaint  Patient presents with  . Metastatic cancer with cord compression.     Functional deficits.     HPI: Dennis Fischer is a 73 year old male with history of HTN, prostate cancer undergoing chemo, severe neuropathy with progressive weakness BLE for past three months progressing to inability to walk and multiple falls.  History taken from chart review and patient.  Work-up revealed metastatic lesions to his scapula, right fifth and seventh ribs as well as thoracic spine.  MRI thoracic spine done by Dr. Lorin Fischer for work up 03/09/2019 showed cord compression at T4 and T8 with significant osseous metastatic disease in mid thoracic spine with involvement of vertebral bodies from T4-T8.  Dr. Annette Fischer consulted and he was taken to OR on 03/10/2023 T7-9 decompressive laminectomies with resection of epidural tumor.  Postop, he was started on Decadron. BLE strength reported to be improving. Spinal drain removed today and steroids discontinued? Therapy ongoing and CIR recommended due to functional decline.  Please see preadmission assessment from earlier today as well.  Review of Systems  Constitutional: Negative for fever.  HENT: Negative for hearing loss and tinnitus.   Eyes: Negative for blurred vision and double vision.  Respiratory: Negative for cough, shortness of breath and stridor.   Cardiovascular: Negative for chest pain, palpitations and leg swelling.  Gastrointestinal: Positive for constipation (NO BM since Sunday) and nausea. Negative for heartburn.  Genitourinary: Negative for dysuria and urgency.  Musculoskeletal: Positive for falls (multiple).  Skin: Negative for rash.  Neurological: Positive for sensory change and focal weakness. Negative for dizziness and headaches.  Psychiatric/Behavioral: The patient is not nervous/anxious and does not have insomnia.   All other systems reviewed and are negative.    Past Medical  History:  Diagnosis Date  . Hypertension   . Metastatic cancer to bone ([place]) dx'd 2018  . Prostate cancer [facility])     Past Surgical History:  Procedure Laterality Date  . HIP SURGERY Bilateral   . IR IMAGING GUIDED PORT INSERTION  04/22/2018  . LAMINECTOMY N/A 03/09/2019   Procedure: THORACIC SEVEN - THORACIC EIGHT - THORACIC NINE LAMINECTOMY WITH RESECTION OF EPIDURAL TUMOR;  Surgeon: Dennis Larsson, MD;  Location: [facility];  Service: Neurosurgery;  Laterality: N/A;    Family History  Problem Relation Age of Onset  . Prostate cancer Father     Social History:  Married. Independent PTA but has had multiple falls in the past couple of months. 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  He   reports that he quit smoking about 4 years ago. His smoking use included cigarettes--he smoked about 3-4 per day for about 58 years. He has never used smokeless tobacco. He reports current alcohol use-- a beer three times a week.  He reports current drug use. Drugs: medical Marijuana?    Allergies  Allergen Reactions  . Lisinopril Swelling    Facial swelling  . Cat Hair Extract     eyes swell  . Mangifera Indica     MANGO --  . Mango Flavor     throat swells with mango  . Other   . Peanut-Containing Drug Products   . Pistachio Nut (Diagnostic)     hands and feet burn/itch  . Sunflower Oil     Sunflower seed allergy    Medications Prior to Admission  Medication Sig Dispense Refill  . amLODipine (NORVASC) 10 MG tablet TAKE 1 TABLET(10 MG) BY MOUTH DAILY (  Patient taking differently: Take 10 mg by mouth daily. ) 90 tablet 3  . aspirin 81 MG tablet Take 81 mg by mouth daily.    . calcium-vitamin D (OSCAL WITH D) 500-200 MG-UNIT tablet Take 1 tablet by mouth 2 (two) times daily. 60 tablet 3  . carvedilol (COREG) 12.5 MG tablet TAKE 1 TABLET BY MOUTH TWICE DAILY WITH A MEAL (Patient taking differently: Take 12.5 mg by mouth 2 (two) times daily with a meal. ) 180 tablet 3  . gabapentin (NEURONTIN)  300 MG capsule Take 1 capsule (300 mg total) by mouth at bedtime. 30 capsule 5  . lidocaine-prilocaine (EMLA) cream Apply 1 application topically as needed. (Patient taking differently: Apply 1 application topically as needed (numbing). ) 30 g 0  . Multiple Vitamin (MULTIVITAMIN) tablet Take 1 tablet by mouth daily.    Marland Kitchen omeprazole (PRILOSEC) 20 MG capsule TAKE 1 CAPSULE(20 MG) BY MOUTH DAILY (Patient taking differently: Take 20 mg by mouth every other day. ) 90 capsule 0  . prochlorperazine (COMPAZINE) 10 MG tablet TAKE 1 TABLET BY MOUTH EVERY 6 HOURS AS NEEDED FOR NAUSEA OR VOMITING (Patient taking differently: Take 10 mg by mouth every 6 (six) hours as needed for nausea or vomiting. ) 368 tablet 3  . sildenafil (VIAGRA) 100 MG tablet Take 1 tablet (100 mg total) by mouth daily as needed for erectile dysfunction. 30 tablet 3  . simvastatin (ZOCOR) 20 MG tablet TAKE 1 TABLET BY MOUTH EVERY DAY 90 tablet 3  . potassium chloride SA (K-DUR,KLOR-CON) 20 MEQ tablet TAKE 1 TABLET(20 MEQ) BY MOUTH DAILY (Patient not taking: Reported on 03/10/2019) 90 tablet 0    Drug Regimen Review  Drug regimen was reviewed and remains appropriate with no significant issues identified  Home: Home Living Family/patient expects to be discharged to:: Inpatient rehab Living Arrangements: Spouse/significant other([name] girlfriend of 11 years) Available Help at Discharge: Family, Available 24 hours/day([name]) Type of Home: Apartment Home Access: Stairs to enter [employer] of Steps: 3 Entrance Stairs-Rails: Can reach both Home Layout: One level Bathroom Shower/Tub: Chiropodist: Standard Bathroom Accessibility: Yes Home Equipment: Environmental consultant - 2 wheels, Tub bench  Lives With: Significant other([name])   Functional History: Prior Function Level of Independence: Needs assistance Gait / Transfers Assistance Needed: Patient reports over the past few weeeks his wife helped him into his  wheelchair and up the stairs  ADL's / Homemaking Assistance Needed: Wife made meals and assisted him with ADL's  Comments: Patient was Mod I RW until 1 month ago and now w/c level  Functional Status:  Mobility: Bed Mobility Overal bed mobility: Needs Assistance Bed Mobility: Sit to Supine Supine to sit: Mod assist Sit to supine: Mod assist General bed mobility comments: requires mod cues for sequences and mod (A) to elevate bil le onto bed surface Transfers Overall transfer level: Needs assistance Equipment used: Rolling walker (2 wheeled) Transfer via Lift Equipment: Stedy Transfers: Sit to/from Stand Sit to Stand: Mod assist General transfer comment: pt able to sequence task and use stedy . pt sustain position in stedy for >10 minutes Ambulation/Gait Ambulation/Gait assistance: Max assist Gait Distance (Feet): 3 Feet Assistive device: Rolling walker (2 wheeled) General Gait Details: decreased coordination in the left leg. Mod cuingt to stay in the walker and to  back up all the way to the chair.r Gait velocity: decreased    ADL: ADL Overall ADL's : Needs assistance/impaired Eating/Feeding: Set up, Sitting Grooming: Oral care, Wash/dry face, Set up, Sitting  Grooming Details (indicate cue type and reason): using stedy at sink level and washing hands for the first time in over a month. pt very excited and states "this is so nice"  Upper Body Bathing: Minimal assistance, Sitting Lower Body Bathing: Total assistance Lower Body Dressing: Total assistance Toilet Transfer: Moderate assistance(stedy) Toilet Transfer Details (indicate cue type and reason): pt able to pull up with bil Ue on stedy with mod (A) from standard chair height. pt able to sustain standing and alternating weight shift in stedy General ADL Comments: pt requesting to transfer from chair back to bed. pt expressing his like for the stedy use. pt provided room phone number to give to family . pt appreciates OT session  and wants more therapy prior to home  Cognition: Cognition Overall Cognitive Status: Within Functional Limits for tasks assessed Orientation Level: Oriented to person, Oriented to place, Oriented to situation, Oriented to time Cognition Arousal/Alertness: Awake/alert Behavior During Therapy: [facility] for tasks assessed/performed Overall Cognitive Status: Within Functional Limits for tasks assessed General Comments: Aox3    Blood pressure (!) 148/91, pulse 89, temperature 98 F (36.7 C), temperature source Oral, resp. rate 13, height 6' (1.829 m), weight 88.4 kg, SpO2 100 %. Physical Exam  Vitals reviewed. Constitutional: He appears well-developed and well-nourished.  HENT:  Head: Normocephalic and atraumatic.  Eyes: EOM are normal. Right eye exhibits no discharge. Left eye exhibits no discharge.  Respiratory: Effort normal. No respiratory distress.  GI: He exhibits no distension.  Musculoskeletal:     Comments: No edema or tenderness in extremities  Neurological: He is alert.  Motor: Right upper extremity: 4+/5 proximal to distal. Right lower extremity: 4/5 proximal to distally  Left upper extremity: 5/5 proximal to distal Left lower extremity: 4+/5 proximal to distal Sensation intact light touch  Skin: Skin is warm and dry.  Psychiatric: He has a normal mood and affect. His behavior is normal. Thought content normal.    Results for orders placed or performed during the hospital encounter of 03/09/19 (from the past 48 hour(s))  SARS Coronavirus 2 (CEPHEID - Performed in [facility] hospital lab), Hosp Order     Status: None   Collection Time: 03/09/19  6:20 PM   Specimen: Nasopharyngeal Swab  Result Value Ref Range   SARS Coronavirus 2 NEGATIVE NEGATIVE    Comment: (NOTE) If result is NEGATIVE SARS-CoV-2 target nucleic acids are NOT DETECTED. The SARS-CoV-2 RNA is generally detectable in upper and lower  respiratory specimens during the acute phase of infection. The lowest   concentration of SARS-CoV-2 viral copies this assay can detect is 250  copies / mL. A negative result does not preclude SARS-CoV-2 infection  and should not be used as the sole basis for treatment or other  patient management decisions.  A negative result may occur with  improper specimen collection / handling, submission of specimen other  than nasopharyngeal swab, presence of viral mutation(s) within the  areas targeted by this assay, and inadequate number of viral copies  (<250 copies / mL). A negative result must be combined with clinical  observations, patient history, and epidemiological information. If result is POSITIVE SARS-CoV-2 target nucleic acids are DETECTED. The SARS-CoV-2 RNA is generally detectable in upper and lower  respiratory specimens dur ing the acute phase of infection.  Positive  results are indicative of active infection with SARS-CoV-2.  Clinical  correlation with patient history and other diagnostic information is  necessary to determine patient infection status.  Positive results do  not rule out bacterial infection or co-infection with other viruses. If result is PRESUMPTIVE POSTIVE SARS-CoV-2 nucleic acids MAY BE PRESENT.   A presumptive positive result was obtained on the submitted specimen  and confirmed on repeat testing.  While 2019 novel coronavirus  (SARS-CoV-2) nucleic acids may be present in the submitted sample  additional confirmatory testing may be necessary for epidemiological  and / or clinical management purposes  to differentiate between  SARS-CoV-2 and other Sarbecovirus currently known to infect humans.  If clinically indicated additional testing with an alternate test  methodology [phone]) is advised. The SARS-CoV-2 RNA is generally  detectable in upper and lower respiratory sp ecimens during the acute  phase of infection. The expected result is Negative. Fact Sheet for Patients:  StrictlyIdeas.no Fact Sheet  for Healthcare Providers: BankingDealers.co.za This test is not yet approved or cleared by the [place] FDA and has been authorized for detection and/or diagnosis of SARS-CoV-2 by FDA under an Emergency Use Authorization (EUA).  This EUA will remain in effect (meaning this test can be used) for the duration of the COVID-19 declaration under Section 564(b)(1) of the Act, 21 U.S.C. section 360bbb-3(b)(1), unless the authorization is terminated or revoked sooner. Performed at [facility], [place] [address] [place], [place] [address]   Protime-INR     Status: None   Collection Time: 03/09/19  6:40 PM  Result Value Ref Range   Prothrombin Time 13.8 11.4 - 15.2 seconds   INR 1.1 0.8 - 1.2    Comment: (NOTE) INR goal varies based on device and disease states. Performed at [facility], [place] [address] [place], [place] [address]   Type and screen [place]     Status: None   Collection Time: 03/09/19  6:51 PM  Result Value Ref Range   ABO/RH(D) O POS    Antibody Screen NEG    Sample Expiration      03/12/2019,2359 Performed at [facility], [place] [address] [place], [place] [address]   ABO/Rh     Status: None   Collection Time: 03/09/19  6:51 PM  Result Value Ref Range   ABO/RH(D)      O POS Performed at [facility] [address] [place], [place] [address]   Iron and TIBC     Status: Abnormal   Collection Time: 03/10/19  4:33 AM  Result Value Ref Range   Iron 45 45 - 182 ug/dL   TIBC 274 250 - 450 ug/dL   Saturation Ratios 16 (L) 17.9 - 39.5 %   UIBC 229 ug/dL    Comment: Performed at [facility], [place] [address], [place], [place] [address]  Ferritin     Status: None   Collection Time: 03/10/19  4:33 AM  Result Value Ref Range   Ferritin 214 24 - 336 ng/mL    Comment: Performed at [facility] [address], [place], [place] [address]  Basic metabolic panel     Status: Abnormal    Collection Time: 03/10/19 10:56 AM  Result Value Ref Range   Sodium 137 135 - 145 mmol/L   Potassium 4.0 3.5 - 5.1 mmol/L   Chloride 102 98 - 111 mmol/L   CO2 22 22 - 32 mmol/L   Glucose, Bld 211 (H) 70 - 99 mg/dL   BUN 11 8 - 23 mg/dL   Creatinine, Ser 1.14 0.61 - 1.24 mg/dL   Calcium 8.7 (L) 8.9 -  10.3 mg/dL   GFR calc non Af Amer >60 >60 mL/min   GFR calc Af Amer >60 >60 mL/min   Anion gap 13 5 - 15    Comment: Performed at [facility] [address] [place], [place] [address]  CBC     Status: Abnormal   Collection Time: 03/10/19 10:56 AM  Result Value Ref Range   WBC 8.2 4.0 - 10.5 K/uL   RBC 3.44 (L) 4.22 - 5.81 MIL/uL   Hemoglobin 10.1 (L) 13.0 - 17.0 g/dL   HCT 30.2 (L) 39.0 - 52.0 %   MCV 87.8 80.0 - 100.0 fL   MCH 29.4 26.0 - 34.0 pg   MCHC 33.4 30.0 - 36.0 g/dL   RDW 18.0 (H) 11.5 - 15.5 %   Platelets 221 150 - 400 K/uL   nRBC 0.0 0.0 - 0.2 %    Comment: Performed at [facility], [place] [address], [place], [place] [address]  Glucose, capillary     Status: Abnormal   Collection Time: 03/10/19 12:22 PM  Result Value Ref Range   Glucose-Capillary 188 (H) 70 - 99 mg/dL   Comment 1 Notify RN    Comment 2 Document in Chart   Urine rapid drug screen (hosp performed)     Status: Abnormal   Collection Time: 03/10/19 12:32 PM  Result Value Ref Range   Opiates NONE DETECTED NONE DETECTED   Cocaine POSITIVE (A) NONE DETECTED   Benzodiazepines POSITIVE (A) NONE DETECTED   Amphetamines NONE DETECTED NONE DETECTED   Tetrahydrocannabinol NONE DETECTED NONE DETECTED   Barbiturates NONE DETECTED NONE DETECTED    Comment: (NOTE) DRUG SCREEN FOR MEDICAL PURPOSES ONLY.  IF CONFIRMATION IS NEEDED FOR ANY PURPOSE, NOTIFY LAB WITHIN 5 DAYS. LOWEST DETECTABLE LIMITS FOR URINE DRUG SCREEN Drug Class                     Cutoff (ng/mL) Amphetamine and metabolites    1000 Barbiturate and metabolites    200 Benzodiazepine                 333 Tricyclics and metabolites      300 Opiates and metabolites        300 Cocaine and metabolites        300 THC                            50 Performed at [facility], [place] [address] [place], [place] [address]   Glucose, capillary     Status: Abnormal   Collection Time: 03/10/19  5:12 PM  Result Value Ref Range   Glucose-Capillary 153 (H) 70 - 99 mg/dL   Comment 1 Notify RN    Comment 2 Document in Chart   Glucose, capillary     Status: Abnormal   Collection Time: 03/10/19  9:25 PM  Result Value Ref Range   Glucose-Capillary 149 (H) 70 - 99 mg/dL   Comment 1 Notify RN    Comment 2 Document in Chart   CBC     Status: Abnormal   Collection Time: 03/11/19  5:00 AM  Result Value Ref Range   WBC 9.8 4.0 - 10.5 K/uL   RBC 2.67 (L) 4.22 - 5.81 MIL/uL   Hemoglobin 8.0 (L) 13.0 - 17.0 g/dL    Comment: REPEATED TO VERIFY   HCT 23.4 (L) 39.0 - 52.0 %   MCV 87.6 80.0 -  100.0 fL   MCH 30.0 26.0 - 34.0 pg   MCHC 34.2 30.0 - 36.0 g/dL   RDW 18.1 (H) 11.5 - 15.5 %   Platelets 186 150 - 400 K/uL   nRBC 0.0 0.0 - 0.2 %    Comment: Performed at [facility] [address], [place], [place] [address]  Basic metabolic panel     Status: Abnormal   Collection Time: 03/11/19  5:00 AM  Result Value Ref Range   Sodium 140 135 - 145 mmol/L   Potassium 3.7 3.5 - 5.1 mmol/L   Chloride 108 98 - 111 mmol/L   CO2 23 22 - 32 mmol/L   Glucose, Bld 170 (H) 70 - 99 mg/dL   BUN 19 8 - 23 mg/dL   Creatinine, Ser 0.98 0.61 - 1.24 mg/dL   Calcium 8.6 (L) 8.9 - 10.3 mg/dL   GFR calc non Af Amer >60 >60 mL/min   GFR calc Af Amer >60 >60 mL/min   Anion gap 9 5 - 15    Comment: Performed at [facility], [place] [address], [place], [place] [address]  Glucose, capillary     Status: Abnormal   Collection Time: 03/11/19  6:26 AM  Result Value Ref Range   Glucose-Capillary 158 (H) 70 - 99 mg/dL   Comment 1 Notify RN    Comment 2 Document in Chart   Glucose, capillary     Status: Abnormal   Collection Time: 03/11/19  6:41  AM  Result Value Ref Range   Glucose-Capillary 160 (H) 70 - 99 mg/dL  Glucose, capillary     Status: Abnormal   Collection Time: 03/11/19 12:40 PM  Result Value Ref Range   Glucose-Capillary 168 (H) 70 - 99 mg/dL   Comment 1 Notify RN    Comment 2 Document in Chart    Dg Thoracic Spine 4v  Result Date: 03/09/2019 CLINICAL DATA:  73 year old male undergoing T7 through T9 laminectomy for spinal metastatic disease with cord compression. EXAM: THORACIC SPINE - 4+ VIEW COMPARISON:  Cervical and thoracic spine MRI earlier today. Lumbar MRI 01/26/2019. CT Abdomen and Pelvis 11/17/2018. Chest radiographs 09/04/2017. FINDINGS: Transitional lumbosacral anatomy suspected when comparing these various exams. The same numbering system will be used as on the thoracic spine MRI today, with full size ribs suspected at T12 and hypoplastic ribs at L1. Subsequently, there is a fully lumbarized S1 level suspected which differs from the lumbar spine numbering on prior CT and MRI. Portable AP supine view at at 2136 hours. The T10-T11 disc spaces marked with scissors. Portable intraoperative cross-table lateral views of the thoracic and lumbar spine. These cross-table lateral images demonstrate thoracic spine localization at T8-T9. IMPRESSION: 1. Transitional lumbosacral anatomy suspected, probably with a fully lumbarized S1 level which differs from the MRI and CT numbering 01/26/2019 and 11/17/2018. 2. Same numbering system used as on the cervical and thoracic spine MRI earlier today. 3. Intraoperative localization at T10-T11 on the AP and T8-T9 on the cross-table lateral views. Electronically Signed   By: Genevie Ann M.D.   On: 03/09/2019 23:10   Medical Problem List and Plan: 1.  Deficits with mobility, endurance, self-care secondary to thoracic myelopathy status post decompression secondary to epidural tumor from prostate cancer.  Admit to CIR 2.  Antithrombotics: -DVT/anticoagulation:  Mechanical: Sequential compression  devices, below knee Bilateral lower extremities  -antiplatelet therapy: N/A 3. Pain Management: Hydrocodone prn effective.  4. Mood: LCSW to follow for evaluation and support.   -  antipsychotic agents: N/A 5. Neuropsych: This patient is capable of making decisions on his own behalf. 6. Skin/Wound Care: Monitor  Incision daily for healing.  7. Fluids/Electrolytes/Nutrition: Monitor I/O.  BMP ordered for tomorrow a.m. 8. HTN: Monitor BP tid--continue Coreg and Norvasc.  Monitor with increased mobility 9. Neuropathy: Reports improvement in sensation BLE. Continue Neurontin at bedtime.   Monitor pain with increased mobility.  Post Admission Physician Evaluation: 1. Preadmission assessment reviewed and changes made below. 2. Functional deficits secondary  to epidural tumor from prostate cancer. 3. Patient is admitted to receive collaborative, interdisciplinary care between the physiatrist, rehab nursing staff, and therapy team. 4. Patient's level of medical complexity and substantial therapy needs in context of that medical necessity cannot be provided at a lesser intensity of care such as a SNF. 5. Patient has experienced substantial functional loss from his/her baseline which was documented above under the "Functional History" and "Functional Status" headings.  Judging by the patient's diagnosis, physical exam, and functional history, the patient has potential for functional progress which will result in measurable gains while on inpatient rehab.  These gains will be of substantial and practical use upon discharge  in facilitating mobility and self-care at the household level. 6. Physiatrist will provide 24 hour management of medical needs as well as oversight of the therapy plan/treatment and provide guidance as appropriate regarding the interaction of the two. 7. 24 hour rehab nursing will assist with bladder management, bowel management, safety, skin/wound care, disease management, pain management  and patient education  and help integrate therapy concepts, techniques,education, etc. 8. PT will assess and treat for/with: Lower extremity strength, range of motion, stamina, balance, functional mobility, safety, adaptive techniques and equipment, wound care, coping skills, pain control, education. Goals are: Supervision/Min A. 9. OT will assess and treat for/with: ADL's, functional mobility, safety, upper extremity strength, adaptive techniques and equipment, wound mgt, ego support, and community reintegration.   Goals are: Supervision/Min A. Therapy may not proceed with showering this patient. 10. Case Management and Social Worker will assess and treat for psychological issues and discharge planning. 11. Team conference will be held weekly to assess progress toward goals and to determine barriers to discharge. 12. Patient will receive at least 3 hours of therapy per day at least 5 days per week. 13. ELOS: 14-16 days.       14. Prognosis:  good  I have personally performed a face to face diagnostic evaluation, including, but not limited to relevant history and physical exam findings, of this patient and developed relevant assessment and plan.  Additionally, I have reviewed and concur with the physician assistant's documentation above.  Delice Lesch, MD, ABPMR Bary Leriche, PA-C 03/11/2019

## 2019-03-11 NOTE — Anesthesia Postprocedure Evaluation (Signed)
Anesthesia Post Note  Patient: Dennis Fischer  Procedure(s) Performed: THORACIC SEVEN - THORACIC EIGHT - THORACIC NINE LAMINECTOMY WITH RESECTION OF EPIDURAL TUMOR (N/A Back)     Patient location during evaluation: PACU Anesthesia Type: General Level of consciousness: awake and alert Pain management: pain level controlled Vital Signs Assessment: post-procedure vital signs reviewed and stable Respiratory status: spontaneous breathing, nonlabored ventilation, respiratory function stable and patient connected to nasal cannula oxygen Cardiovascular status: blood pressure returned to baseline and stable Postop Assessment: no apparent nausea or vomiting Anesthetic complications: no    Last Vitals:  Vitals:   03/11/19 0100 03/11/19 0317  BP: 124/74 129/77  Pulse: 82 76  Resp: 18 18  Temp: 36.7 C 36.9 C  SpO2: 98% 97%    Last Pain:  Vitals:   03/11/19 0317  TempSrc: Oral  PainSc:                  Tiajuana Amass

## 2019-03-11 NOTE — Progress Notes (Addendum)
Inpatient Rehabilitation Admissions Coordinator  I contacted Dr. Vanessa Dorchester at [phone] to clarify when pt felt medically ready to d/c to CIR/inpt rehab. He will discuss with attending MD and then call me back.  Danne Baxter, RN, MSN Rehab Admissions Coordinator [phone] 03/11/2019 9:59 AM  Dr Vanessa MacArthur hs clarified and pt can be admitted to CIR today. I will make the arrangements.  Danne Baxter, RN, MSN Rehab Admissions Coordinator [phone] 03/11/2019 11:23 AM

## 2019-03-11 NOTE — TOC Transition Note (Signed)
Transition of Care [facility]) - CM/SW Discharge Note   Patient Details  Name: Dennis Fischer MRN: [id number] Date of Birth: Mar 17, 1946  Transition of Care [facility]) CM/SW Contact:  Pollie Friar, RN Phone Number: 03/11/2019, 3:13 PM   Clinical Narrative:    Pt discharging to CIR  Today. TOC signing off.   Final next level of care: IP Rehab Facility Barriers to Discharge: No Barriers Identified   Patient Goals and CMS Choice        Discharge Placement                       Discharge Plan and Services                                     Social Determinants of Health (SDOH) Interventions     Readmission Risk Interventions No flowsheet data found.

## 2019-03-11 NOTE — Consult Note (Addendum)
Radiation Oncology         [phone] ________________________________  Inpatient Consultation  Name: Dennis Fischer MRN: [id number]  Date of Service: 03/11/2019 DOB: 1946/07/23  CC:[name], [name], MD    REFERRING PHYSICIAN: Dr. Annette Fischer  DIAGNOSIS: The primary encounter diagnosis was Cord compression myelopathy ([facility]). Diagnoses of Prostate cancer metastatic to bone [facility]) and Surgery, elective were also pertinent to this visit.    ICD-10-CM   1. Cord compression myelopathy (HCC)  G95.20   2. Prostate cancer metastatic to bone ([place])  C61    C79.51   3. Surgery, elective  Z41.9 DG Thoracic Spine 4V    DG Thoracic Spine 4V    CANCELED: DG Thoracic Spine 1 View    CANCELED: DG Thoracic Spine 1 View    HISTORY OF PRESENT ILLNESS: Dennis Fischer is a 73 y.o. male seen at the request of Dr. Annette Fischer for a history of progressive metastatic castrate resistant prostate cancer.  Dennis Fischer is well-known to this service, he was seen in March 2020 by Dr. Tammi Fischer given his castrate resistant disease and development of bony metastases.  At the time he was asymptomatic in the T8 location, bilateral scapula, ribs, and right mandible, and he was offered [place].  He began these treatments and is currently scheduled for his 4th  out of 6 treatments on 03/31/2019.  He has been tolerating Xofigo well.  Unfortunately the patient presented with acute onset of lower extremity weakness that was noted to be progressive in a very short amount of time.  He was seen in the emergency room and an MRI of the thoracic spine specifically at T8 revealed severe dorsal epidural tumor and marked spinal cord compression.  He was taken on 03/09/2019 to the operating room for decompression laminectomy with Dr. Trenton Fischer from T7-T9.  He is still recovering at [facility], and while pathology is pending the most likely scenario is that this is his known history of prostate cancer.  He is contacted by phone to coordinate outpatient adjuvant radiotherapy.   In addition, Dr. Alen Blew continues to see the patient as he remains on Lupron.  His most recent systemic treatment had been Taxotere which was completed in January 2020.  PREVIOUS RADIATION THERAPY: Yes  02/02/2015 - 04/04/2015: Prostate (+boost) / 45 Gy in 25 fractions (+30.6 Gy in 17 fractions) (Dr. Luiz [facility] at [facility])  PAST MEDICAL HISTORY:  Past Medical History:  Diagnosis Date   Hypertension    Metastatic cancer to bone ([place]) dx'd 2018   Prostate cancer ([place])       PAST SURGICAL HISTORY: Past Surgical History:  Procedure Laterality Date   HIP SURGERY Bilateral    IR IMAGING GUIDED PORT INSERTION  04/22/2018   LAMINECTOMY N/A 03/09/2019   Procedure: THORACIC SEVEN - THORACIC EIGHT - THORACIC NINE LAMINECTOMY WITH RESECTION OF EPIDURAL TUMOR;  Surgeon: Earnie Larsson, MD;  Location: [place];  Service: Neurosurgery;  Laterality: N/A;    FAMILY HISTORY:  Family History  Problem Relation Age of Onset   Prostate cancer Father     SOCIAL HISTORY:  Social History   Socioeconomic History   Marital status: Significant Other    Spouse name: Dennis Fischer   Number of children: 3   Years of education: Not on file   Highest education level: Not on file  Occupational History   Not on file  Social Needs   Financial resource strain: Not on file   Food insecurity    Worry: Not on file  Inability: Not on file   Transportation needs    Medical: Not on file    Non-medical: Not on file  Tobacco Use   Smoking status: Former Smoker    Packs/day: 0.25    Years: 50.00    Pack years: 12.50    Types: Cigarettes    Quit date: 09/10/2014    Years since quitting: 4.5   Smokeless tobacco: Never Used   Tobacco comment: Reports smoking only when he drank.  Substance and Sexual Activity   Alcohol use: Yes    Comment: Once every 2 weeks.    Drug use: Yes    Types: Marijuana, Cocaine    Comment: Medical    Sexual activity: Not Currently  Lifestyle   Physical  activity    Days per week: Not on file    Minutes per session: Not on file   Stress: Not on file  Relationships   Social connections    Talks on phone: Not on file    Gets together: Not on file    Attends religious service: Not on file    Active member of club or organization: Not on file    Attends meetings of clubs or organizations: Not on file    Relationship status: Not on file   Intimate partner violence    Fear of current or ex partner: Not on file    Emotionally abused: Not on file    Physically abused: Not on file    Forced sexual activity: Not on file  Other Topics Concern   Not on file  Social History Narrative   Not on file  The patient is in a common-law marriage, he is originally from the [place] area.  He relocated to [place] several years ago to be closer to his daughter.  He is a retired Actor.  ALLERGIES: Lisinopril, Cat hair extract, Mangifera indica, Mango flavor, Other, Peanut-containing drug products, Pistachio nut (diagnostic), and Sunflower oil  MEDICATIONS:  Current Facility-Administered Medications  Medication Dose Route Frequency Provider Last Rate Last Dose   0.9 %  sodium chloride infusion  250 mL Intravenous Continuous Earnie Larsson, MD 1 mL/hr at 03/10/19 0117 250 mL at 03/10/19 0117   acetaminophen (TYLENOL) tablet 650 mg  650 mg Oral Q4H PRN Earnie Larsson, MD       Or   acetaminophen (TYLENOL) suppository 650 mg  650 mg Rectal Q4H PRN Earnie Larsson, MD       amLODipine (NORVASC) tablet 10 mg  10 mg Oral Daily Matilde Haymaker, MD   10 mg at 03/11/19 0905   carvedilol (COREG) tablet 12.5 mg  12.5 mg Oral BID WC Matilde Haymaker, MD   12.5 mg at 03/11/19 8101   cyclobenzaprine (FLEXERIL) tablet 10 mg  10 mg Oral TID PRN Earnie Larsson, MD       dexamethasone (DECADRON) injection 10 mg  10 mg Intravenous Q6H Earnie Larsson, MD   10 mg at 03/11/19 7510   gabapentin (NEURONTIN) capsule 300 mg  300 mg Oral QHS Matilde Haymaker, MD   300 mg at  03/10/19 2103   HYDROcodone-acetaminophen (NORCO) 10-325 MG per tablet 2 tablet  2 tablet Oral Q4H PRN Earnie Larsson, MD       HYDROcodone-acetaminophen (NORCO/VICODIN) 5-325 MG per tablet 1 tablet  1 tablet Oral Q4H PRN Earnie Larsson, MD   1 tablet at 03/10/19 0131   HYDROmorphone (DILAUDID) injection 1 mg  1 mg Intravenous Q2H PRN Earnie Larsson, MD  menthol-cetylpyridinium (CEPACOL) lozenge 3 mg  1 lozenge Oral PRN Earnie Larsson, MD       Or   phenol (CHLORASEPTIC) mouth spray 1 spray  1 spray Mouth/Throat PRN Earnie Larsson, MD       ondansetron [facility]) tablet 4 mg  4 mg Oral Q6H PRN Earnie Larsson, MD       Or   ondansetron [facility]) injection 4 mg  4 mg Intravenous Q6H PRN Earnie Larsson, MD       oxybutynin (DITROPAN-XL) 24 hr tablet 10 mg  10 mg Oral QHS Earnie Larsson, MD   10 mg at 03/10/19 2103   pantoprazole (PROTONIX) EC tablet 40 mg  40 mg Oral Daily Matilde Haymaker, MD   40 mg at 03/11/19 4097   prochlorperazine (COMPAZINE) tablet 10 mg  10 mg Oral Q6H PRN Earnie Larsson, MD       simvastatin (ZOCOR) tablet 20 mg  20 mg Oral q1800 Matilde Haymaker, MD   20 mg at 03/10/19 1753   sodium chloride flush (NS) 0.9 % injection 3 mL  3 mL Intravenous Q12H Earnie Larsson, MD   3 mL at 03/10/19 2217   sodium chloride flush (NS) 0.9 % injection 3 mL  3 mL Intravenous PRN Earnie Larsson, MD        REVIEW OF SYSTEMS:  On review of systems, the patient reports that he is doing well overall.  He reports he is feeling quite well and reports that he is not having any problems with weakness at this time.  He feels completely different following his surgery and reports that his pain is under very good control.  He is quite pleased thus far.  Denies any chest pain, shortness of breath, cough, fevers, chills, night sweats, unintended weight changes.  He denies any bowel or bladder disturbances, and denies abdominal pain, nausea or vomiting.  He denies any new musculoskeletal or joint aches or pains. A complete review of  systems is obtained and is otherwise negative.    PHYSICAL EXAM:  Wt Readings from Last 3 Encounters:  03/10/19 194 lb 14.2 oz (88.4 kg)  02/17/19 187 lb (84.8 kg)  01/26/19 187 lb 6.4 oz (85 kg)   Temp Readings from Last 3 Encounters:  03/11/19 97.9 F (36.6 C) (Oral)  01/26/19 98.9 F (37.2 C) (Oral)  01/13/19 98.7 F (37.1 C) (Oral)   BP Readings from Last 3 Encounters:  03/11/19 133/79  01/26/19 127/82  01/13/19 126/87   Pulse Readings from Last 3 Encounters:  03/11/19 70  01/26/19 96  01/13/19 95   Pain Assessment Pain Score: 0-No pain/10  Unable to assess due to encounter type  KPS = 50  100 - Normal; no complaints; no evidence of disease. 90   - Able to carry on normal activity; minor signs or symptoms of disease. 80   - Normal activity with effort; some signs or symptoms of disease. 6   - Cares for self; unable to carry on normal activity or to do active work. 60   - Requires occasional assistance, but is able to care for most of his personal needs. 50   - Requires considerable assistance and frequent medical care. 44   - Disabled; requires special care and assistance. 93   - Severely disabled; hospital admission is indicated although death not imminent. 66   - Very sick; hospital admission necessary; active supportive treatment necessary. 10   - Moribund; fatal processes progressing rapidly. 0     - Dead  Karnofsky DA, Abelmann WH, Craver LS and [facility] [phone]) The use of the nitrogen mustards in the palliative treatment of carcinoma: with particular reference to bronchogenic carcinoma Cancer 1 634-56  LABORATORY DATA:  Lab Results  Component Value Date   WBC 9.8 03/11/2019   HGB 8.0 (L) 03/11/2019   HCT 23.4 (L) 03/11/2019   MCV 87.6 03/11/2019   PLT 186 03/11/2019   Lab Results  Component Value Date   NA 140 03/11/2019   K 3.7 03/11/2019   CL 108 03/11/2019   CO2 23 03/11/2019   Lab Results  Component Value Date   ALT 14 01/13/2019    AST 14 (L) 01/13/2019   ALKPHOS 116 01/13/2019   BILITOT <0.2 (L) 01/13/2019     RADIOGRAPHY: Dg Thoracic Spine 4v  Result Date: 03/09/2019 CLINICAL DATA:  73 year old male undergoing T7 through T9 laminectomy for spinal metastatic disease with cord compression. EXAM: THORACIC SPINE - 4+ VIEW COMPARISON:  Cervical and thoracic spine MRI earlier today. Lumbar MRI 01/26/2019. CT Abdomen and Pelvis 11/17/2018. Chest radiographs 09/04/2017. FINDINGS: Transitional lumbosacral anatomy suspected when comparing these various exams. The same numbering system will be used as on the thoracic spine MRI today, with full size ribs suspected at T12 and hypoplastic ribs at L1. Subsequently, there is a fully lumbarized S1 level suspected which differs from the lumbar spine numbering on prior CT and MRI. Portable AP supine view at at 2136 hours. The T10-T11 disc spaces marked with scissors. Portable intraoperative cross-table lateral views of the thoracic and lumbar spine. These cross-table lateral images demonstrate thoracic spine localization at T8-T9. IMPRESSION: 1. Transitional lumbosacral anatomy suspected, probably with a fully lumbarized S1 level which differs from the MRI and CT numbering 01/26/2019 and 11/17/2018. 2. Same numbering system used as on the cervical and thoracic spine MRI earlier today. 3. Intraoperative localization at T10-T11 on the AP and T8-T9 on the cross-table lateral views. Electronically Signed   By: Genevie Ann M.D.   On: 03/09/2019 23:10   Mr Cervical Spine W/o Contrast  Addendum Date: 03/09/2019   ADDENDUM REPORT: 03/09/2019 14:58 ADDENDUM: Critical Value/emergent results were called by telephone at the time of interpretation on 03/09/2019 at 2:55 pm to Dr. Rodell Perna , who verbally acknowledged these results. Electronically Signed   By: Richardean Sale M.D.   On: 03/09/2019 14:58   Result Date: 03/09/2019 CLINICAL DATA:  Bilateral extremity weakness with inability to ambulate. History of  prostate cancer with osseous metastatic disease. Bowel changes. EXAM: MRI CERVICAL AND THORACIC SPINE WITHOUT CONTRAST TECHNIQUE: Multiplanar and multiecho pulse sequences of the cervical spine, to include the cervicothoracic junction, and the thoracic spine, were obtained without intravenous contrast. COMPARISON:  MR lumbar spine 01/26/2019. Abdominopelvic CT 11/17/2018. FINDINGS: MRI CERVICAL SPINE FINDINGS Alignment: Straightening without focal angulation or listhesis. Vertebrae: No evidence of acute fracture. Probable ankylosis across the C2-3 disc and facet joints. There is a small lesion posteriorly in the C3 vertebral body which is best seen on image 5/3, likely a metastasis. No other definite metastases within the cervical spine. Cord: Normal in signal and caliber. Posterior Fossa, vertebral arteries, paraspinal tissues: The craniocervical junction appears normal. There are bilateral vertebral artery flow voids. No significant paraspinal findings. Disc levels: C2-3: As above, probable ankylosis across the vertebral body and facet joints. The axial images do not extend across this disc space level. C3-4: Spondylosis with disc bulging, uncinate spurring and facet hypertrophy. Mild spinal stenosis with mild to moderate foraminal narrowing bilaterally. No  cord deformity. C4-5: Spondylosis with uncinate spurring and facet hypertrophy. Mild spinal stenosis with mild to moderate foraminal narrowing bilaterally. No cord deformity. C5-6: Spondylosis with uncinate spurring and facet hypertrophy. No cord deformity. Mild to moderate foraminal narrowing bilaterally. C6-7: Spondylosis with posterior osteophytes and bilateral uncinate spurring. No cord deformity. Mild to moderate foraminal narrowing bilaterally. C7-T1: Spondylosis with posterior osteophytes contributing to severe foraminal narrowing bilaterally. No cord deformity. MRI THORACIC SPINE FINDINGS Alignment:  Normal. Vertebrae: There is widespread osseous  metastatic disease within the midthoracic spine with involvement of all the vertebral bodies from T4 through T10. There is also involvement of the posterior elements at T4 and T8. There is a mild pathologic fracture at T4 with osseous retropulsion. The right T8 pedicle and lamina appear expanded with possible adjacent epidural tumor. Cord: There is cord compression, most notably at the T4 and T8 levels. Mild cord T2 hyperintensity is present at those levels. No definite cord hemorrhage. The conus medullaris extends to the upper L2 level. Paraspinal and other soft tissues: There is paraspinous edema in the midthoracic spine and small bilateral pleural effusions. Disc levels: No significant disc space findings at T1-2, T2-3 or T3-4. T4-5: No significant disc pathology. As above, there is a T4 metastasis with a mild pathologic fracture and possible ventral epidural tumor. There is mild cord flattening. T5-6: No significant findings. T6-7: There is osseous metastatic disease within the T6 and T7 vertebral bodies with prominence of the posterior epidural fat. The CSF surrounding the cord is effaced without apparent focal epidural tumor at this level. T7-8: There is osseous metastatic disease with posterior epidural tumor, especially within the right T8 pedicle. There is resulting mild cord compression and cord T2 hyperintensity. T8-9: The CSF surrounding the cord is effaced without focal epidural tumor. Mild degenerative changes are present within the lower thoracic spine with disc bulging at T9-10 and L1-2. No cord deformity. IMPRESSION: 1. Multifocal osseous metastatic disease in the midthoracic spine extending from T4 through T10. Mild pathologic fracture at T4. 2. Cord compression at the T4 and T8 levels. The CSF surrounding the cord is largely effaced from T4 through T9. 3. Probable solitary metastasis within the C3 vertebral body. No evidence of cord compression in the cervical spine. 4. Multilevel cervical  spondylosis. Electronically Signed: By: Richardean Sale M.D. On: 03/09/2019 14:51   Mr Thoracic Spine W/o Contrast  Addendum Date: 03/09/2019   ADDENDUM REPORT: 03/09/2019 14:58 ADDENDUM: Critical Value/emergent results were called by telephone at the time of interpretation on 03/09/2019 at 2:55 pm to Dr. Rodell Perna , who verbally acknowledged these results. Electronically Signed   By: Richardean Sale M.D.   On: 03/09/2019 14:58   Result Date: 03/09/2019 CLINICAL DATA:  Bilateral extremity weakness with inability to ambulate. History of prostate cancer with osseous metastatic disease. Bowel changes. EXAM: MRI CERVICAL AND THORACIC SPINE WITHOUT CONTRAST TECHNIQUE: Multiplanar and multiecho pulse sequences of the cervical spine, to include the cervicothoracic junction, and the thoracic spine, were obtained without intravenous contrast. COMPARISON:  MR lumbar spine 01/26/2019. Abdominopelvic CT 11/17/2018. FINDINGS: MRI CERVICAL SPINE FINDINGS Alignment: Straightening without focal angulation or listhesis. Vertebrae: No evidence of acute fracture. Probable ankylosis across the C2-3 disc and facet joints. There is a small lesion posteriorly in the C3 vertebral body which is best seen on image 5/3, likely a metastasis. No other definite metastases within the cervical spine. Cord: Normal in signal and caliber. Posterior Fossa, vertebral arteries, paraspinal tissues: The craniocervical junction appears normal. There  are bilateral vertebral artery flow voids. No significant paraspinal findings. Disc levels: C2-3: As above, probable ankylosis across the vertebral body and facet joints. The axial images do not extend across this disc space level. C3-4: Spondylosis with disc bulging, uncinate spurring and facet hypertrophy. Mild spinal stenosis with mild to moderate foraminal narrowing bilaterally. No cord deformity. C4-5: Spondylosis with uncinate spurring and facet hypertrophy. Mild spinal stenosis with mild to  moderate foraminal narrowing bilaterally. No cord deformity. C5-6: Spondylosis with uncinate spurring and facet hypertrophy. No cord deformity. Mild to moderate foraminal narrowing bilaterally. C6-7: Spondylosis with posterior osteophytes and bilateral uncinate spurring. No cord deformity. Mild to moderate foraminal narrowing bilaterally. C7-T1: Spondylosis with posterior osteophytes contributing to severe foraminal narrowing bilaterally. No cord deformity. MRI THORACIC SPINE FINDINGS Alignment:  Normal. Vertebrae: There is widespread osseous metastatic disease within the midthoracic spine with involvement of all the vertebral bodies from T4 through T10. There is also involvement of the posterior elements at T4 and T8. There is a mild pathologic fracture at T4 with osseous retropulsion. The right T8 pedicle and lamina appear expanded with possible adjacent epidural tumor. Cord: There is cord compression, most notably at the T4 and T8 levels. Mild cord T2 hyperintensity is present at those levels. No definite cord hemorrhage. The conus medullaris extends to the upper L2 level. Paraspinal and other soft tissues: There is paraspinous edema in the midthoracic spine and small bilateral pleural effusions. Disc levels: No significant disc space findings at T1-2, T2-3 or T3-4. T4-5: No significant disc pathology. As above, there is a T4 metastasis with a mild pathologic fracture and possible ventral epidural tumor. There is mild cord flattening. T5-6: No significant findings. T6-7: There is osseous metastatic disease within the T6 and T7 vertebral bodies with prominence of the posterior epidural fat. The CSF surrounding the cord is effaced without apparent focal epidural tumor at this level. T7-8: There is osseous metastatic disease with posterior epidural tumor, especially within the right T8 pedicle. There is resulting mild cord compression and cord T2 hyperintensity. T8-9: The CSF surrounding the cord is effaced without  focal epidural tumor. Mild degenerative changes are present within the lower thoracic spine with disc bulging at T9-10 and L1-2. No cord deformity. IMPRESSION: 1. Multifocal osseous metastatic disease in the midthoracic spine extending from T4 through T10. Mild pathologic fracture at T4. 2. Cord compression at the T4 and T8 levels. The CSF surrounding the cord is largely effaced from T4 through T9. 3. Probable solitary metastasis within the C3 vertebral body. No evidence of cord compression in the cervical spine. 4. Multilevel cervical spondylosis. Electronically Signed: By: Richardean Sale M.D. On: 03/09/2019 14:51   Nm Xofigo Injection  Result Date: 03/03/2019  Trudi Ida was injected intravenously in Nuclear Medicine under the supervision of the attending radiologist      IMPRESSION/PLAN: 63. 73 y.o. gentleman with progressive metastatic castrate resistant adenocarcinoma of the prostate.  Dr. Tammi Fischer has reviewed the patient's case and his offered a palliative course of adjuvant radiotherapy to reduce the risk of progressive disease and treat microscopic disease at the site.  He is also in the midst of undergoing Xofigo injections, I believe that this will also continue but further discussion regarding this will come from Dr. Tammi Fischer.  I spent time with the patient by phone today talking about the side effects as well as the risks and benefits, short and long-term effects of therapy and the delivery and logistics.  He would benefit from a 10 fraction  course and we would wait for him to have 2 additional weeks of healing prior to administering treatment.  Since he will already be in the cancer center on 714 for lab work related to his upcoming [place] injection, he was willing to schedule simulation.  Verbal consent was obtained, and written consent will be obtained on the date of his simulation.  I encouraged him to let us know if he had any questions or concerns, and his wife may also reach out as well.  In  a visit lasting 30 minutes, greater than 50% of the time was spent by phone discussing his case, and in floor time, coordinating the patient's care.     Carola Rhine, [facility]   Page Me

## 2019-03-11 NOTE — Progress Notes (Signed)
Jamse Arn, MD  Physician  Physical Medicine and Rehabilitation  PMR Pre-admission  Signed  Date of Service:  03/11/2019 11:49 AM      Related encounter: ED to Hosp-Admission (Current) from 03/09/2019 in [facility]      Signed         Show:Clear all '[x]' Manual'[x]' Template'[]' Copied  Added by: '[x]' Cristina Gong, RN'[x]' Jamse Arn, MD  '[]' Hover for details PMR Admission Coordinator Pre-Admission Assessment  Patient: Dennis Fischer is an 73 y.o., male MRN: [id number] DOB: 1945/11/28 Height: 6' (182.9 cm) Weight: 88.4 kg  Insurance Information HMO:     PPO:      PCP:      IPA:      80/20:      OTHER: no HMO PRIMARY: Medicare a and b      Policy#: [id number]      Subscriber: pt Benefits:  Phone #: passport one online     Name: 03/11/2019 Eff. Date: a 07/12/2011 b     Deduct: $1408      Out of Pocket Max: none      Life Max: none CIR: 100%      SNF: 20 full days Outpatient: 80%     Co-Pay: 20% Home Health: 100%      Co-Pay: none DME: 80%     Co-Pay: 20% Providers: pt choice  SECONDARY: AARP Medicare supplement      Policy#: [id number]      Subscriber: pt  Medicaid Application Date:       Case Manager:  Disability Application Date:       Case Worker:   The "Data Collection Information Summary" for patients in Inpatient Rehabilitation Facilities with attached "Privacy Act [place] Records" was provided and verbally reviewed with: Patient  Emergency Contact Information         Contact Information    Name Relation Home Work Mobile   Gavin,[name] Significant other [phone]        Current Medical History  Patient Admitting Diagnosis: spinal cord compression s/p thoracic laminectomy secondary to prostate Cancer  History of Present Illness: 73 year old male with castration resistant prostate cancer. Past medial history of stage IV prostate cancer, anemia, heart failure, some urinary incontinence with bladder spasms,  headaches, GERD, HDL and prediabetes. History of bilateral total hip replacement, HTN retinopathy. Presented on 6/29 to [facility] and transferred to [facility] for surgery. Progressive bilateral lower extremity weakness. Changes in sensation form his mid chest distally. He was Mod I then RW to now over past few weeks wheelchair dependent. Does have bladder control. He was diagnosed with neuropathy but symptoms progressive. MRI cervical and thoracic demonstrated evidence of extensive metastatic disease to his thoracic spine with moderate spinal stenosis at T4. At T8 evidence of severe dorsal epidural tumor with marked cord compression. Neurosurgery consulted.  To OR with Dr. Annette Stable on 6/29 for T7-T8-T9 decompressive laminectomy with resection of epidural tumor, microdissection. Postoperatively held Asa, dexamethasone per neurosurgery to monitor diabetes with CBGS due to steroids. Home meds with oxybutynin to continue. Sensation and strength improved postop. Drain to be removed today.   Oncologist Dr. Alen Blew updated. Patient no longer on Zytiga. Radiation Oncology consulted. Patient as outpatient was scheduled for his 4th out of 6th treatments on 7/21 of his Trudi Ida. Patient with history of radiation treatment for prostate 2016. Plans for two additional weeks of healing postoperatively and then plan 10 fraction course of palliative radiation.  Patient noted cocaine on admission.  Patient's medical record from [facility]  has been reviewed by the rehabilitation admission coordinator and physician.  Past Medical History      Past Medical History:  Diagnosis Date  . Hypertension   . Metastatic cancer to bone ([place]) dx'd 2018  . Prostate cancer [facility])     Family History   family history includes Prostate cancer in his father.  Prior Rehab/Hospitalizations Has the patient had prior rehab or hospitalizations prior to admission? Yes  Has the patient had major surgery  during 100 days prior to admission? Yes             Current Medications  Current Facility-Administered Medications:  .  0.9 %  sodium chloride infusion, 250 mL, Intravenous, Continuous, Pool, Mallie Mussel, MD, Last Rate: 1 mL/hr at 03/10/19 0117, 250 mL at 03/10/19 0117 .  acetaminophen (TYLENOL) tablet 650 mg, 650 mg, Oral, Q4H PRN **OR** acetaminophen (TYLENOL) suppository 650 mg, 650 mg, Rectal, Q4H PRN, Earnie Larsson, MD .  amLODipine (NORVASC) tablet 10 mg, 10 mg, Oral, Daily, Matilde Haymaker, MD, 10 mg at 03/11/19 0905 .  carvedilol (COREG) tablet 12.5 mg, 12.5 mg, Oral, BID WC, Matilde Haymaker, MD, 12.5 mg at 03/11/19 6415 .  cyclobenzaprine (FLEXERIL) tablet 10 mg, 10 mg, Oral, TID PRN, Earnie Larsson, MD .  dexamethasone (DECADRON) injection 10 mg, 10 mg, Intravenous, Q6H, Earnie Larsson, MD, 10 mg at 03/11/19 8309 .  gabapentin (NEURONTIN) capsule 300 mg, 300 mg, Oral, QHS, Matilde Haymaker, MD, 300 mg at 03/10/19 2103 .  HYDROcodone-acetaminophen (NORCO) 10-325 MG per tablet 2 tablet, 2 tablet, Oral, Q4H PRN, Earnie Larsson, MD .  HYDROcodone-acetaminophen (NORCO/VICODIN) 5-325 MG per tablet 1 tablet, 1 tablet, Oral, Q4H PRN, Earnie Larsson, MD, 1 tablet at 03/10/19 0131 .  HYDROmorphone (DILAUDID) injection 1 mg, 1 mg, Intravenous, Q2H PRN, Pool, Mallie Mussel, MD .  menthol-cetylpyridinium (CEPACOL) lozenge 3 mg, 1 lozenge, Oral, PRN **OR** phenol (CHLORASEPTIC) mouth spray 1 spray, 1 spray, Mouth/Throat, PRN, Pool, Mallie Mussel, MD .  ondansetron (ZOFRAN) tablet 4 mg, 4 mg, Oral, Q6H PRN **OR** ondansetron (ZOFRAN) injection 4 mg, 4 mg, Intravenous, Q6H PRN, Earnie Larsson, MD .  oxybutynin (DITROPAN-XL) 24 hr tablet 10 mg, 10 mg, Oral, QHS, Pool, Mallie Mussel, MD, 10 mg at 03/10/19 2103 .  pantoprazole (PROTONIX) EC tablet 40 mg, 40 mg, Oral, Daily, Matilde Haymaker, MD, 40 mg at 03/11/19 0905 .  prochlorperazine (COMPAZINE) tablet 10 mg, 10 mg, Oral, Q6H PRN, Earnie Larsson, MD .  simvastatin (ZOCOR) tablet 20 mg, 20 mg, Oral, q1800, Matilde Haymaker, MD, 20 mg at 03/10/19 1753 .  sodium chloride flush (NS) 0.9 % injection 3 mL, 3 mL, Intravenous, Q12H, Earnie Larsson, MD, 3 mL at 03/10/19 2217 .  sodium chloride flush (NS) 0.9 % injection 3 mL, 3 mL, Intravenous, PRN, Earnie Larsson, MD  Patients Current Diet:     Diet Order                  Diet regular Room service appropriate? Yes; Fluid consistency: Thin  Diet effective now               Precautions / Restrictions Precautions Precautions: Fall Restrictions Weight Bearing Restrictions: No   Has the patient had 2 or more falls or a fall with injury in the past year? No  Prior Activity Level Limited Community (1-2x/wk): progressive weaknes over past month to wheelchair level. Months ago used no AD, progressed to cane and eventual wheelchair level over past few  weeks. Weakness first noted 4/20 after most recent trial of chemotherapy.  Prior Functional Level Self Care: Did the patient need help bathing, dressing, using the toilet or eating? Needed some help  Indoor Mobility: Did the patient need assistance with walking from room to room (with or without device)? Needed some help  Stairs: Did the patient need assistance with internal or external stairs (with or without device)? Needed some help  Functional Cognition: Did the patient need help planning regular tasks such as shopping or remembering to take medications? Independent  Home Assistive Devices / Equipment Home Assistive Devices/Equipment: Wheelchair Home Equipment: Walker - 2 wheels, Tub bench  Prior Device Use: Indicate devices/aids used by the patient prior to current illness, exacerbation or injury? Manual wheelchair and 1  Current Functional Level Cognition  Overall Cognitive Status: Within Functional Limits for tasks assessed Orientation Level: Oriented X4 General Comments: Aox3     Extremity Assessment (includes Sensation/Coordination)  Upper Extremity Assessment: Overall WFL for  tasks assessed  Lower Extremity Assessment: Defer to PT evaluation LLE Deficits / Details: 3/5 hip flexion and abduction  LLE Coordination: decreased gross motor    ADLs  Overall ADL's : Needs assistance/impaired Eating/Feeding: Set up, Sitting Grooming: Oral care, Wash/dry face, Set up, Sitting Grooming Details (indicate cue type and reason): using stedy at sink level and washing hands for the first time in over a month. pt very excited and states "this is so nice"  Upper Body Bathing: Minimal assistance, Sitting Lower Body Bathing: Total assistance Lower Body Dressing: Total assistance Toilet Transfer: Moderate assistance(stedy) Toilet Transfer Details (indicate cue type and reason): pt able to pull up with bil Ue on stedy with mod (A) from standard chair height. pt able to sustain standing and alternating weight shift in stedy General ADL Comments: pt requesting to transfer from chair back to bed. pt expressing his like for the stedy use. pt provided room phone number to give to family . pt appreciates OT session and wants more therapy prior to home    Mobility  Overal bed mobility: Needs Assistance Bed Mobility: Sit to Supine Supine to sit: Mod assist Sit to supine: Mod assist General bed mobility comments: requires mod cues for sequences and mod (A) to elevate bil le onto bed surface    Transfers  Overall transfer level: Needs assistance Equipment used: Rolling walker (2 wheeled) Transfer via Lift Equipment: Stedy Transfers: Sit to/from Stand Sit to Stand: Mod assist General transfer comment: pt able to sequence task and use stedy . pt sustain position in stedy for >10 minutes    Ambulation / Gait / Stairs / Wheelchair Mobility  Ambulation/Gait Ambulation/Gait assistance: Max Web designer (Feet): 3 Feet Assistive device: Rolling walker (2 wheeled) General Gait Details: decreased coordination in the left leg. Mod cuingt to stay in the walker and to  back up  all the way to the chair.r Gait velocity: decreased    Posture / Balance Dynamic Sitting Balance Sitting balance - Comments: mild syncope sitting edge of the bed  Balance Overall balance assessment: Needs assistance Sitting-balance support: Single extremity supported, Feet supported Sitting balance-Leahy Scale: Fair Sitting balance - Comments: mild syncope sitting edge of the bed  Standing balance support: Bilateral upper extremity supported Standing balance-Leahy Scale: Good Standing balance comment: reliant on BIL UE support    Special needs/care consideration BiPAP/CPAP  N/a CPM  N/a Continuous Drip IV has porto cath Dialysis  N/a Life Vest  N/a Oxygen  N/a Special Bed  N/a  Trach Size  N/a Wound Vac n/a Skin surgical incision; surgical with 10 french to posterior back Bowel mgmt:  LBM none documented Bladder mgmt:  External catheter Diabetic mgmt: prediabetes pta Behavioral consideration  N/a Chemo/radiation history of radiation 2016; currently taking chemo, plans for additional radiation after 2 weeks of postop healing   Previous Home Environment  Living Arrangements: Spouse/significant other([name] girlfriend of 11 years)  Lives With: Significant other([name]) Available Help at Discharge: Family, Available 24 hours/day([name]) Type of Home: Apartment Home Layout: One level Home Access: Stairs to enter Entrance Stairs-Rails: Can reach both Entrance Stairs-Number of Steps: 3 Bathroom Shower/Tub: Optometrist: Yes How Accessible: Accessible via walker [place]: No  Discharge Living Setting Plans for Discharge Living Setting: Patient's home, Apartment, Lives with (comment)([name], girlfriend of 11 years) Type of Home at Discharge: Apartment Discharge Home Layout: One level Discharge Home Access: Stairs to enter Entrance Stairs-Rails: Right, Left, Can reach both Entrance Stairs-Number of Steps: 3  Discharge Bathroom Shower/Tub: Tub/shower unit, Curtain Discharge Bathroom Toilet: Standard Discharge Bathroom Accessibility: Yes How Accessible: Accessible via walker Does the patient have any problems obtaining your medications?: No  Social/Family/Support Systems Patient Roles: Parent, Partner Contact Information: [name], girlfriend Anticipated Caregiver: [name] Anticipated Caregiver's Contact Information: [phone] Ability/Limitations of Caregiver: no limitations Caregiver Availability: 24/7 Discharge Plan Discussed with Primary Caregiver: Yes Is Caregiver In Agreement with Plan?: Yes Does Caregiver/Family have Issues with Lodging/Transportation while Pt is in Rehab?: No  Goals/Additional Needs Patient/Family Goal for Rehab: supervision to min assist with PT and OT Expected length of stay: ELOS 10 to 14 days Additional Information: Patient and [name] were making arrnagments to move to another apartment before this; those plans are on hold Pt/Family Agrees to Admission and willing to participate: Yes Program Orientation Provided & Reviewed with Pt/Caregiver Including Roles  & Responsibilities: Yes  Decrease burden of Care through IP rehab admission: n/a  Possible need for SNF placement upon discharge:  Potentially  Patient Condition: I have reviewed medical records from [facility] , spoken with CM, and patient and spouse. I met with patient at the bedside for inpatient rehabilitation assessment.  Patient will benefit from ongoing PT and OT, can actively participate in 3 hours of therapy a day 5 days of the week, and can make measurable gains during the admission.  Patient will also benefit from the coordinated team approach during an Inpatient Acute Rehabilitation admission.  The patient will receive intensive therapy as well as Rehabilitation physician, nursing, social worker, and care management interventions.  Due to bladder management, bowel management, safety,  skin/wound care, disease management, medication administration, pain management and patient education the patient requires 24 hour a day rehabilitation nursing.  The patient is currently mod to max assist with mobility and basic ADLs.  Discharge setting and therapy post discharge at home with home health is anticipated.  Patient has agreed to participate in the Acute Inpatient Rehabilitation Program and will admit today.  Preadmission Screen Completed By:  Cleatrice Burke, RN MSN 03/11/2019 11:49 AM ______________________________________________________________________   Discussed status with Dr. Posey Pronto  on  03/11/2019 at  1221 and received approval for admission today.  Admission Coordinator:  Cleatrice Burke, RN, time  1221 Date  03/11/2019   Assessment/Plan: Diagnosis: spinal cord compression s/p thoracic laminectomy secondary to prostate Cancer  1. Does the need for close, 24 hr/day Medical supervision in concert with the patient's rehab needs make it unreasonable for this patient to be  served in a less intensive setting? Potentially  2. Co-Morbidities requiring supervision/potential complications: stage IV prostate cancer, anemia, heart failure, some urinary incontinence with bladder spasms, headaches, GERD, HDL and prediabetes 3. Due to bladder management, bowel management, safety, skin/wound care, disease management, pain management and patient education, does the patient require 24 hr/day rehab nursing? Yes 4. Does the patient require coordinated care of a physician, rehab nurse, PT (1-2 hrs/day, 5 days/week) and OT (1-2 hrs/day, 5 days/week) to address physical and functional deficits in the context of the above medical diagnosis(es)? Potentially Addressing deficits in the following areas: balance, endurance, locomotion, strength, transferring, bathing, dressing, toileting and psychosocial support 5. Can the patient actively participate in an intensive therapy program of at  least 3 hrs of therapy 5 days a week? Yes 6. The potential for patient to make measurable gains while on inpatient rehab is excellent 7. Anticipated functional outcomes upon discharge from inpatients are: min assist and mod assist PT, min assist and mod assist OT, n/a SLP 8. Estimated rehab length of stay to reach the above functional goals is: 14-17 days. 9. Anticipated D/C setting: Home 10. Anticipated post D/C treatments: HH therapy and Home excercise program 11. Overall Rehab/Functional Prognosis: good  MD Signature: Delice Lesch, MD, ABPMR        Revision History

## 2019-03-11 NOTE — H&P (Signed)
Physical Medicine and Rehabilitation Admission H&P    Chief Complaint  Patient presents with  . Metastatic cancer with cord compression.     Functional deficits.     HPI: Dennis Fischer is a 73 year old male with history of HTN, prostate cancer undergoing chemo, severe neuropathy with progressive weakness BLE for past three months progressing to inability to walk and multiple falls.  History taken from chart review and patient.  Work-up revealed metastatic lesions to his scapula, right fifth and seventh ribs as well as thoracic spine.  MRI thoracic spine done by [name] for work up 03/09/2019 showed cord compression at T4 and T8 with significant osseous metastatic disease in mid thoracic spine with involvement of vertebral bodies from T4-T8.  Dr. Annette Stable consulted and he was taken to OR on 03/10/2023 T7-9 decompressive laminectomies with resection of epidural tumor.  Postop, he was started on Decadron. BLE strength reported to be improving. Spinal drain removed today and steroids discontinued? Therapy ongoing and CIR recommended due to functional decline.   Review of Systems  Constitutional: Negative for fever.  HENT: Negative for hearing loss and tinnitus.   Eyes: Negative for blurred vision and double vision.  Respiratory: Negative for cough, shortness of breath and stridor.   Cardiovascular: Negative for chest pain, palpitations and leg swelling.  Gastrointestinal: Positive for constipation (NO BM since Sunday) and nausea. Negative for heartburn.  Genitourinary: Negative for dysuria and urgency.  Musculoskeletal: Positive for falls (multiple).  Skin: Negative for rash.  Neurological: Positive for sensory change and focal weakness. Negative for dizziness and headaches.  Psychiatric/Behavioral: The patient is not nervous/anxious and does not have insomnia.   All other systems reviewed and are negative.    Past Medical History:  Diagnosis Date  . Hypertension   . Metastatic cancer to  bone ([place]) dx'd 2018  . Prostate cancer [facility])     Past Surgical History:  Procedure Laterality Date  . HIP SURGERY Bilateral   . IR IMAGING GUIDED PORT INSERTION  04/22/2018  . LAMINECTOMY N/A 03/09/2019   Procedure: THORACIC SEVEN - THORACIC EIGHT - THORACIC NINE LAMINECTOMY WITH RESECTION OF EPIDURAL TUMOR;  Surgeon: Earnie Larsson, MD;  Location: [place];  Service: Neurosurgery;  Laterality: N/A;    Family History  Problem Relation Age of Onset  . Prostate cancer Father     Social History:  Married. Independent PTA but has had multiple falls in the past couple of months. 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  He   reports that he quit smoking about 4 years ago. His smoking use included cigarettes--he smoked about 3-4 per day for about 58 years. He has never used smokeless tobacco. He reports current alcohol use-- a beer three times a week.  He reports current drug use. Drugs: medical Marijuana?    Allergies  Allergen Reactions  . Lisinopril Swelling    Facial swelling  . Cat Hair Extract     eyes swell  . Mangifera Indica     MANGO --  . Mango Flavor     throat swells with mango  . Other   . Peanut-Containing Drug Products   . Pistachio Nut (Diagnostic)     hands and feet burn/itch  . Sunflower Oil     Sunflower seed allergy    Medications Prior to Admission  Medication Sig Dispense Refill  . amLODipine (NORVASC) 10 MG tablet TAKE 1 TABLET(10 MG) BY MOUTH DAILY (Patient taking differently: Take 10 mg by mouth daily. )  90 tablet 3  . aspirin 81 MG tablet Take 81 mg by mouth daily.    . calcium-vitamin D (OSCAL WITH D) 500-200 MG-UNIT tablet Take 1 tablet by mouth 2 (two) times daily. 60 tablet 3  . carvedilol (COREG) 12.5 MG tablet TAKE 1 TABLET BY MOUTH TWICE DAILY WITH A MEAL (Patient taking differently: Take 12.5 mg by mouth 2 (two) times daily with a meal. ) 180 tablet 3  . gabapentin (NEURONTIN) 300 MG capsule Take 1 capsule (300 mg total) by mouth at bedtime. 30  capsule 5  . lidocaine-prilocaine (EMLA) cream Apply 1 application topically as needed. (Patient taking differently: Apply 1 application topically as needed (numbing). ) 30 g 0  . Multiple Vitamin (MULTIVITAMIN) tablet Take 1 tablet by mouth daily.    Marland Kitchen omeprazole (PRILOSEC) 20 MG capsule TAKE 1 CAPSULE(20 MG) BY MOUTH DAILY (Patient taking differently: Take 20 mg by mouth every other day. ) 90 capsule 0  . prochlorperazine (COMPAZINE) 10 MG tablet TAKE 1 TABLET BY MOUTH EVERY 6 HOURS AS NEEDED FOR NAUSEA OR VOMITING (Patient taking differently: Take 10 mg by mouth every 6 (six) hours as needed for nausea or vomiting. ) 368 tablet 3  . sildenafil (VIAGRA) 100 MG tablet Take 1 tablet (100 mg total) by mouth daily as needed for erectile dysfunction. 30 tablet 3  . simvastatin (ZOCOR) 20 MG tablet TAKE 1 TABLET BY MOUTH EVERY DAY 90 tablet 3  . potassium chloride SA (K-DUR,KLOR-CON) 20 MEQ tablet TAKE 1 TABLET(20 MEQ) BY MOUTH DAILY (Patient not taking: Reported on 03/10/2019) 90 tablet 0    Drug Regimen Review  Drug regimen was reviewed and remains appropriate with no significant issues identified  Home: Home Living Family/patient expects to be discharged to:: Inpatient rehab Living Arrangements: Spouse/significant other([name] girlfriend of 11 years) Available Help at Discharge: Family, Available 24 hours/day([name]) Type of Home: Apartment Home Access: Stairs to enter [employer] of Steps: 3 Entrance Stairs-Rails: Can reach both Home Layout: One level Bathroom Shower/Tub: Chiropodist: Standard Bathroom Accessibility: Yes Home Equipment: Environmental consultant - 2 wheels, Tub bench  Lives With: Significant other([name])   Functional History: Prior Function Level of Independence: Needs assistance Gait / Transfers Assistance Needed: Patient reports over the past few weeeks his wife helped him into his wheelchair and up the stairs  ADL's / Homemaking Assistance Needed: Wife  made meals and assisted him with ADL's  Comments: Patient was Mod I RW until 1 month ago and now w/c level  Functional Status:  Mobility: Bed Mobility Overal bed mobility: Needs Assistance Bed Mobility: Sit to Supine Supine to sit: Mod assist Sit to supine: Mod assist General bed mobility comments: requires mod cues for sequences and mod (A) to elevate bil le onto bed surface Transfers Overall transfer level: Needs assistance Equipment used: Rolling walker (2 wheeled) Transfer via Lift Equipment: Stedy Transfers: Sit to/from Stand Sit to Stand: Mod assist General transfer comment: pt able to sequence task and use stedy . pt sustain position in stedy for >10 minutes Ambulation/Gait Ambulation/Gait assistance: Max assist Gait Distance (Feet): 3 Feet Assistive device: Rolling walker (2 wheeled) General Gait Details: decreased coordination in the left leg. Mod cuingt to stay in the walker and to  back up all the way to the chair.r Gait velocity: decreased    ADL: ADL Overall ADL's : Needs assistance/impaired Eating/Feeding: Set up, Sitting Grooming: Oral care, Wash/dry face, Set up, Sitting Grooming Details (indicate cue type and reason): using stedy at  sink level and washing hands for the first time in over a month. pt very excited and states "this is so nice"  Upper Body Bathing: Minimal assistance, Sitting Lower Body Bathing: Total assistance Lower Body Dressing: Total assistance Toilet Transfer: Moderate assistance(stedy) Toilet Transfer Details (indicate cue type and reason): pt able to pull up with bil Ue on stedy with mod (A) from standard chair height. pt able to sustain standing and alternating weight shift in stedy General ADL Comments: pt requesting to transfer from chair back to bed. pt expressing his like for the stedy use. pt provided room phone number to give to family . pt appreciates OT session and wants more therapy prior to home  Cognition: Cognition Overall  Cognitive Status: Within Functional Limits for tasks assessed Orientation Level: Oriented to person, Oriented to place, Oriented to situation, Oriented to time Cognition Arousal/Alertness: Awake/alert Behavior During Therapy: [facility] for tasks assessed/performed Overall Cognitive Status: Within Functional Limits for tasks assessed General Comments: Aox3    Blood pressure (!) 148/91, pulse 89, temperature 98 F (36.7 C), temperature source Oral, resp. rate 13, height 6' (1.829 m), weight 88.4 kg, SpO2 100 %. Physical Exam  Vitals reviewed. Constitutional: He appears well-developed and well-nourished.  HENT:  Head: Normocephalic and atraumatic.  Eyes: EOM are normal. Right eye exhibits no discharge. Left eye exhibits no discharge.  Respiratory: Effort normal. No respiratory distress.  GI: He exhibits no distension.  Musculoskeletal:     Comments: No edema or tenderness in extremities  Neurological: He is alert.  Motor: Right upper extremity: 4+/5 proximal to distal. Right lower extremity: 4/5 proximal to distally  Left upper extremity: 5/5 proximal to distal Left lower extremity: 4+/5 proximal to distal Sensation intact light touch  Skin: Skin is warm and dry.  Psychiatric: He has a normal mood and affect. His behavior is normal. Thought content normal.    Results for orders placed or performed during the hospital encounter of 03/09/19 (from the past 48 hour(s))  SARS Coronavirus 2 (CEPHEID - Performed in [facility] hospital lab), Hosp Order     Status: None   Collection Time: 03/09/19  6:20 PM   Specimen: Nasopharyngeal Swab  Result Value Ref Range   SARS Coronavirus 2 NEGATIVE NEGATIVE    Comment: (NOTE) If result is NEGATIVE SARS-CoV-2 target nucleic acids are NOT DETECTED. The SARS-CoV-2 RNA is generally detectable in upper and lower  respiratory specimens during the acute phase of infection. The lowest  concentration of SARS-CoV-2 viral copies this assay can detect is 250   copies / mL. A negative result does not preclude SARS-CoV-2 infection  and should not be used as the sole basis for treatment or other  patient management decisions.  A negative result may occur with  improper specimen collection / handling, submission of specimen other  than nasopharyngeal swab, presence of viral mutation(s) within the  areas targeted by this assay, and inadequate number of viral copies  (<250 copies / mL). A negative result must be combined with clinical  observations, patient history, and epidemiological information. If result is POSITIVE SARS-CoV-2 target nucleic acids are DETECTED. The SARS-CoV-2 RNA is generally detectable in upper and lower  respiratory specimens dur ing the acute phase of infection.  Positive  results are indicative of active infection with SARS-CoV-2.  Clinical  correlation with patient history and other diagnostic information is  necessary to determine patient infection status.  Positive results do  not rule out bacterial infection or co-infection with other viruses.  If result is PRESUMPTIVE POSTIVE SARS-CoV-2 nucleic acids MAY BE PRESENT.   A presumptive positive result was obtained on the submitted specimen  and confirmed on repeat testing.  While 2019 novel coronavirus  (SARS-CoV-2) nucleic acids may be present in the submitted sample  additional confirmatory testing may be necessary for epidemiological  and / or clinical management purposes  to differentiate between  SARS-CoV-2 and other Sarbecovirus currently known to infect humans.  If clinically indicated additional testing with an alternate test  methodology [phone]) is advised. The SARS-CoV-2 RNA is generally  detectable in upper and lower respiratory sp ecimens during the acute  phase of infection. The expected result is Negative. Fact Sheet for Patients:  StrictlyIdeas.no Fact Sheet for Healthcare Providers: BankingDealers.co.za  This test is not yet approved or cleared by the [place] FDA and has been authorized for detection and/or diagnosis of SARS-CoV-2 by FDA under an Emergency Use Authorization (EUA).  This EUA will remain in effect (meaning this test can be used) for the duration of the COVID-19 declaration under Section 564(b)(1) of the Act, 21 U.S.C. section 360bbb-3(b)(1), unless the authorization is terminated or revoked sooner. Performed at [facility], [place] [address] [place], [place] [address]   Protime-INR     Status: None   Collection Time: 03/09/19  6:40 PM  Result Value Ref Range   Prothrombin Time 13.8 11.4 - 15.2 seconds   INR 1.1 0.8 - 1.2    Comment: (NOTE) INR goal varies based on device and disease states. Performed at [facility], [place] [address] [place], [place] [address]   Type and screen [place]     Status: None   Collection Time: 03/09/19  6:51 PM  Result Value Ref Range   ABO/RH(D) O POS    Antibody Screen NEG    Sample Expiration      03/12/2019,2359 Performed at [facility], [place] [address] [place], [place] [address]   ABO/Rh     Status: None   Collection Time: 03/09/19  6:51 PM  Result Value Ref Range   ABO/RH(D)      O POS Performed at [place] [address], [place], [place] [address]   Iron and TIBC     Status: Abnormal   Collection Time: 03/10/19  4:33 AM  Result Value Ref Range   Iron 45 45 - 182 ug/dL   TIBC 274 250 - 450 ug/dL   Saturation Ratios 16 (L) 17.9 - 39.5 %   UIBC 229 ug/dL    Comment: Performed at [facility], [place] [address] [place], [place] [address]  Ferritin     Status: None   Collection Time: 03/10/19  4:33 AM  Result Value Ref Range   Ferritin 214 24 - 336 ng/mL    Comment: Performed at [place] [address] [place],  [address]  Basic metabolic panel     Status: Abnormal   Collection Time: 03/10/19 10:56 AM  Result Value Ref Range   Sodium 137 135  - 145 mmol/L   Potassium 4.0 3.5 - 5.1 mmol/L   Chloride 102 98 - 111 mmol/L   CO2 22 22 - 32 mmol/L   Glucose, Bld 211 (H) 70 - 99 mg/dL   BUN 11 8 - 23 mg/dL   Creatinine, Ser 1.14 0.61 - 1.24 mg/dL   Calcium 8.7 (L) 8.9 - 10.3 mg/dL   GFR calc non Af Amer >  60 >60 mL/min   GFR calc Af Amer >60 >60 mL/min   Anion gap 13 5 - 15    Comment: Performed at [place] [address], [place], [place] [address]  CBC     Status: Abnormal   Collection Time: 03/10/19 10:56 AM  Result Value Ref Range   WBC 8.2 4.0 - 10.5 K/uL   RBC 3.44 (L) 4.22 - 5.81 MIL/uL   Hemoglobin 10.1 (L) 13.0 - 17.0 g/dL   HCT 30.2 (L) 39.0 - 52.0 %   MCV 87.8 80.0 - 100.0 fL   MCH 29.4 26.0 - 34.0 pg   MCHC 33.4 30.0 - 36.0 g/dL   RDW 18.0 (H) 11.5 - 15.5 %   Platelets 221 150 - 400 K/uL   nRBC 0.0 0.0 - 0.2 %    Comment: Performed at [facility], [place] [address], [place], [place] [address]  Glucose, capillary     Status: Abnormal   Collection Time: 03/10/19 12:22 PM  Result Value Ref Range   Glucose-Capillary 188 (H) 70 - 99 mg/dL   Comment 1 Notify RN    Comment 2 Document in Chart   Urine rapid drug screen (hosp performed)     Status: Abnormal   Collection Time: 03/10/19 12:32 PM  Result Value Ref Range   Opiates NONE DETECTED NONE DETECTED   Cocaine POSITIVE (A) NONE DETECTED   Benzodiazepines POSITIVE (A) NONE DETECTED   Amphetamines NONE DETECTED NONE DETECTED   Tetrahydrocannabinol NONE DETECTED NONE DETECTED   Barbiturates NONE DETECTED NONE DETECTED    Comment: (NOTE) DRUG SCREEN FOR MEDICAL PURPOSES ONLY.  IF CONFIRMATION IS NEEDED FOR ANY PURPOSE, NOTIFY LAB WITHIN 5 DAYS. LOWEST DETECTABLE LIMITS FOR URINE DRUG SCREEN Drug Class                     Cutoff (ng/mL) Amphetamine and metabolites    1000 Barbiturate and metabolites    200 Benzodiazepine                 119 Tricyclics and metabolites     300 Opiates and metabolites        300 Cocaine and metabolites         300 THC                            50 Performed at [facility], [place] [address] [place], [place] [address]   Glucose, capillary     Status: Abnormal   Collection Time: 03/10/19  5:12 PM  Result Value Ref Range   Glucose-Capillary 153 (H) 70 - 99 mg/dL   Comment 1 Notify RN    Comment 2 Document in Chart   Glucose, capillary     Status: Abnormal   Collection Time: 03/10/19  9:25 PM  Result Value Ref Range   Glucose-Capillary 149 (H) 70 - 99 mg/dL   Comment 1 Notify RN    Comment 2 Document in Chart   CBC     Status: Abnormal   Collection Time: 03/11/19  5:00 AM  Result Value Ref Range   WBC 9.8 4.0 - 10.5 K/uL   RBC 2.67 (L) 4.22 - 5.81 MIL/uL   Hemoglobin 8.0 (L) 13.0 - 17.0 g/dL    Comment: REPEATED TO VERIFY   HCT 23.4 (L) 39.0 - 52.0 %   MCV 87.6 80.0 - 100.0 fL   MCH 30.0 26.0 -  34.0 pg   MCHC 34.2 30.0 - 36.0 g/dL   RDW 18.1 (H) 11.5 - 15.5 %   Platelets 186 150 - 400 K/uL   nRBC 0.0 0.0 - 0.2 %    Comment: Performed at [facility], [place] [address] [place], [place] [address]  Basic metabolic panel     Status: Abnormal   Collection Time: 03/11/19  5:00 AM  Result Value Ref Range   Sodium 140 135 - 145 mmol/L   Potassium 3.7 3.5 - 5.1 mmol/L   Chloride 108 98 - 111 mmol/L   CO2 23 22 - 32 mmol/L   Glucose, Bld 170 (H) 70 - 99 mg/dL   BUN 19 8 - 23 mg/dL   Creatinine, Ser 0.98 0.61 - 1.24 mg/dL   Calcium 8.6 (L) 8.9 - 10.3 mg/dL   GFR calc non Af Amer >60 >60 mL/min   GFR calc Af Amer >60 >60 mL/min   Anion gap 9 5 - 15    Comment: Performed at [facility], [place] [address] [place], [place] [address]  Glucose, capillary     Status: Abnormal   Collection Time: 03/11/19  6:26 AM  Result Value Ref Range   Glucose-Capillary 158 (H) 70 - 99 mg/dL   Comment 1 Notify RN    Comment 2 Document in Chart   Glucose, capillary     Status: Abnormal   Collection Time: 03/11/19  6:41 AM  Result Value Ref Range   Glucose-Capillary 160 (H) 70 - 99 mg/dL   Glucose, capillary     Status: Abnormal   Collection Time: 03/11/19 12:40 PM  Result Value Ref Range   Glucose-Capillary 168 (H) 70 - 99 mg/dL   Comment 1 Notify RN    Comment 2 Document in Chart    Dg Thoracic Spine 4v  Result Date: 03/09/2019 CLINICAL DATA:  73 year old male undergoing T7 through T9 laminectomy for spinal metastatic disease with cord compression. EXAM: THORACIC SPINE - 4+ VIEW COMPARISON:  Cervical and thoracic spine MRI earlier today. Lumbar MRI 01/26/2019. CT Abdomen and Pelvis 11/17/2018. Chest radiographs 09/04/2017. FINDINGS: Transitional lumbosacral anatomy suspected when comparing these various exams. The same numbering system will be used as on the thoracic spine MRI today, with full size ribs suspected at T12 and hypoplastic ribs at L1. Subsequently, there is a fully lumbarized S1 level suspected which differs from the lumbar spine numbering on prior CT and MRI. Portable AP supine view at at 2136 hours. The T10-T11 disc spaces marked with scissors. Portable intraoperative cross-table lateral views of the thoracic and lumbar spine. These cross-table lateral images demonstrate thoracic spine localization at T8-T9. IMPRESSION: 1. Transitional lumbosacral anatomy suspected, probably with a fully lumbarized S1 level which differs from the MRI and CT numbering 01/26/2019 and 11/17/2018. 2. Same numbering system used as on the cervical and thoracic spine MRI earlier today. 3. Intraoperative localization at T10-T11 on the AP and T8-T9 on the cross-table lateral views. Electronically Signed   By: Genevie Ann M.D.   On: 03/09/2019 23:10   Medical Problem List and Plan: 1.  Deficits with mobility, endurance, self-care secondary to thoracic myelopathy status post decompression secondary to epidural tumor from prostate cancer.  Admit to CIR 2.  Antithrombotics: -DVT/anticoagulation:  Mechanical: Sequential compression devices, below knee Bilateral lower extremities  -antiplatelet therapy:  N/A 3. Pain Management: Hydrocodone prn effective.  4. Mood: LCSW to follow for evaluation and support.   -antipsychotic agents: N/A 5. Neuropsych: This patient  is capable of making decisions on his own behalf. 6. Skin/Wound Care: Monitor  Incision daily for healing.  7. Fluids/Electrolytes/Nutrition: Monitor I/O.  BMP ordered for tomorrow a.m. 8. HTN: Monitor BP tid--continue Coreg and Norvasc.  Monitor with increased mobility 9. Neuropathy: Reports improvement in sensation BLE. Continue Neurontin at bedtime.   Monitor pain with increased mobility.  Bary Leriche, PA-C 03/11/2019

## 2019-03-12 ENCOUNTER — Other Ambulatory Visit: Payer: Self-pay

## 2019-03-12 ENCOUNTER — Inpatient Hospital Stay (HOSPITAL_COMMUNITY): Payer: Medicare Other | Admitting: Occupational Therapy

## 2019-03-12 ENCOUNTER — Inpatient Hospital Stay (HOSPITAL_COMMUNITY): Payer: Medicare Other | Admitting: Physical Therapy

## 2019-03-12 ENCOUNTER — Inpatient Hospital Stay (HOSPITAL_COMMUNITY): Payer: Medicare Other

## 2019-03-12 ENCOUNTER — Encounter (HOSPITAL_COMMUNITY): Payer: Self-pay

## 2019-03-12 DIAGNOSIS — R531 Weakness: Secondary | ICD-10-CM

## 2019-03-12 DIAGNOSIS — I10 Essential (primary) hypertension: Secondary | ICD-10-CM

## 2019-03-12 DIAGNOSIS — C7951 Secondary malignant neoplasm of bone: Secondary | ICD-10-CM

## 2019-03-12 DIAGNOSIS — K592 Neurogenic bowel, not elsewhere classified: Secondary | ICD-10-CM

## 2019-03-12 DIAGNOSIS — T380X5A Adverse effect of glucocorticoids and synthetic analogues, initial encounter: Secondary | ICD-10-CM

## 2019-03-12 DIAGNOSIS — R739 Hyperglycemia, unspecified: Secondary | ICD-10-CM

## 2019-03-12 LAB — COMPREHENSIVE METABOLIC PANEL
ALT: 54 U/L — ABNORMAL HIGH (ref 0–44)
AST: 52 U/L — ABNORMAL HIGH (ref 15–41)
Albumin: 2.8 g/dL — ABNORMAL LOW (ref 3.5–5.0)
Alkaline Phosphatase: 64 U/L (ref 38–126)
Anion gap: 9 (ref 5–15)
BUN: 16 mg/dL (ref 8–23)
CO2: 24 mmol/L (ref 22–32)
Calcium: 8.5 mg/dL — ABNORMAL LOW (ref 8.9–10.3)
Chloride: 109 mmol/L (ref 98–111)
Creatinine, Ser: 0.94 mg/dL (ref 0.61–1.24)
GFR calc Af Amer: >60 mL/min (ref >60)
GFR calc non Af Amer: >60 mL/min (ref >60)
Glucose, Bld: 158 mg/dL — ABNORMAL HIGH (ref 70–99)
Potassium: 3.9 mmol/L (ref 3.5–5.1)
Sodium: 142 mmol/L (ref 135–145)
Total Bilirubin: 0.3 mg/dL (ref 0.3–1.2)
Total Protein: 5.7 g/dL — ABNORMAL LOW (ref 6.5–8.1)

## 2019-03-12 LAB — CBC WITH DIFFERENTIAL/PLATELET
Abs Immature Granulocytes: 0.09 10*3/uL — ABNORMAL HIGH (ref 0.00–0.07)
Basophils Absolute: 0 10*3/uL (ref 0.0–0.1)
Basophils Relative: 0 %
Eosinophils Absolute: 0 10*3/uL (ref 0.0–0.5)
Eosinophils Relative: 0 %
HCT: 24 % — ABNORMAL LOW (ref 39.0–52.0)
Hemoglobin: 7.9 g/dL — ABNORMAL LOW (ref 13.0–17.0)
Immature Granulocytes: 1 %
Lymphocytes Relative: 7 %
Lymphs Abs: 0.5 10*3/uL — ABNORMAL LOW (ref 0.7–4.0)
MCH: 29.5 pg (ref 26.0–34.0)
MCHC: 32.9 g/dL (ref 30.0–36.0)
MCV: 89.6 fL (ref 80.0–100.0)
Monocytes Absolute: 0.2 10*3/uL (ref 0.1–1.0)
Monocytes Relative: 3 %
Neutro Abs: 7.2 10*3/uL (ref 1.7–7.7)
Neutrophils Relative %: 89 %
Platelets: 173 10*3/uL (ref 150–400)
RBC: 2.68 MIL/uL — ABNORMAL LOW (ref 4.22–5.81)
RDW: 18.5 % — ABNORMAL HIGH (ref 11.5–15.5)
WBC: 8.1 10*3/uL (ref 4.0–10.5)
nRBC: 0 % (ref 0.0–0.2)

## 2019-03-12 LAB — GLUCOSE, CAPILLARY
Glucose-Capillary: 142 mg/dL — ABNORMAL HIGH (ref 70–99)
Glucose-Capillary: 144 mg/dL — ABNORMAL HIGH (ref 70–99)
Glucose-Capillary: 162 mg/dL — ABNORMAL HIGH (ref 70–99)

## 2019-03-12 MED ORDER — SORBITOL 70 % SOLN
60.0000 mL | Status: AC
  Administered 2019-03-12: 60 mL via ORAL
  Filled 2019-03-12: qty 60

## 2019-03-12 MED ORDER — DEXAMETHASONE 4 MG PO TABS
8.0000 mg | ORAL_TABLET | Freq: Four times a day (QID) | ORAL | Status: AC
  Administered 2019-03-12 – 2019-03-14 (×6): 8 mg via ORAL
  Filled 2019-03-12: qty 2
  Filled 2019-03-12: qty 4
  Filled 2019-03-12 (×4): qty 2

## 2019-03-12 MED ORDER — PRO-STAT SUGAR FREE PO LIQD
30.0000 mL | Freq: Two times a day (BID) | ORAL | Status: DC
  Administered 2019-03-12 – 2019-03-27 (×31): 30 mL via ORAL
  Filled 2019-03-12 (×31): qty 30

## 2019-03-12 NOTE — Progress Notes (Signed)
Occupational Therapy Session Note  Patient Details  Name: Dennis Fischer MRN: [id number] Date of Birth: 04-Oct-1945  Today's Date: 03/12/2019 OT Individual Time: 1500-1530 OT Individual Time Calculation (min): 30 min    Short Term Goals: Week 1:  OT Short Term Goal 1 (Week 1): Pt will transfer to toilet/BSC without use of mechanical lift OT Short Term Goal 2 (Week 1): Pt will don pants sit>stand with steadying assist using LRAD and AE PRN OT Short Term Goal 3 (Week 1): Pt will complete toileting task with mod A using LRAD in order to reduce caregiver burden OT Short Term Goal 4 (Week 1): Pt will bathe seated EOB/ w/c at sink with mod A using LRAD and AE PRN  Skilled Therapeutic Interventions/Progress Updates:  Balance/vestibular training;Discharge planning;Functional electrical stimulation;Pain management;Self Care/advanced ADL retraining;Therapeutic Activities;UE/LE Coordination activities;Disease mangement/prevention;Functional mobility training;Patient/family education;Skin care/wound managment;Therapeutic Exercise;UE/LE Strength taining/ROM;Wheelchair propulsion/positioning;DME/adaptive equipment instruction;Community reintegration;Neuromuscular re-education;Psychosocial support   PM therapy session:  Patient is seated in recliner, states that he is feeling good and ready for therapy session.  Completed sit to stand from recliner x2 with weight shift and stepping activity for 1 minute - min/mod A to stand and CG/min A to maintain balance in stance.  Sit to stand with stedy min/mod A - used stedy to navigate to toilet, CG/min A for pants down, min a for hygiene and max a for pants up .  Returned to bed with stedy, Min a for SSP to supine with good back precaution awareness.  He remained in bed with bed alarm set and call bell in reach at close of session.    Therapy Documentation Precautions:  Precautions Precautions: Fall Restrictions Weight Bearing Restrictions:  No General: General Chart Reviewed: Yes Vital Signs: Therapy Vitals Temp: 98.6 F (37 C) Temp Source: Oral Pulse Rate: 69 Resp: 16 BP: 118/70 Patient Position (if appropriate): Sitting Oxygen Therapy SpO2: 100 % O2 Device: Room Air Pain: Pain Assessment Pain Scale: 0-10 Pain Score: 0-No pain   Other Treatments:     Therapy/Group: Individual Therapy  Carlos Levering 03/12/2019, 3:47 PM

## 2019-03-12 NOTE — Progress Notes (Addendum)
[place] PHYSICAL MEDICINE & REHABILITATION PROGRESS NOTE   Subjective/Complaints: States he did well last night. Anxious to get moving with therapy. He reports he's not in control of bowel and bladder but no incontinence reported by RN/staff  ROS: Patient denies fever, rash, sore throat, blurred vision, nausea, vomiting, diarrhea, cough, shortness of breath or chest pain, headache, or mood change.    Objective:   No results found. Recent Labs    03/11/19 1330 03/12/19 0353  WBC 11.7* 8.1  HGB 9.3* 7.9*  HCT 27.6* 24.0*  PLT 232 173   Recent Labs    03/11/19 0500 03/12/19 0353  NA 140 142  K 3.7 3.9  CL 108 109  CO2 23 24  GLUCOSE 170* 158*  BUN 19 16  CREATININE 0.98 0.94  CALCIUM 8.6* 8.5*    Intake/Output Summary (Last 24 hours) at 03/12/2019 3532 Last data filed at 03/12/2019 0741 Gross per 24 hour  Intake 360 ml  Output 1300 ml  Net -940 ml     Physical Exam: Vital Signs Blood pressure 127/78, pulse 81, temperature 98.4 F (36.9 C), resp. rate 18, height 6' (1.829 m), weight 87.2 kg, SpO2 99 %.  Constitutional: No distress . Vital signs reviewed. HEENT: EOMI, oral membranes moist Neck: supple Cardiovascular: RRR without murmur. No JVD    Respiratory: CTA Bilaterally without wheezes or rales. Normal effort    GI: BS +, non-tender, non-distended  Musculoskeletal:     Comments: No edema or tenderness in LE's Neurological: He is alert.  Motor: RUE 5/5. LUE 5/5. RLE 4-/5. LLE 4/5 No sensory deficits. DTR's 1+ Skin: Skin is warm and dry.  Psychiatric: pleasant and cooperative   Assessment/Plan: 1. 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  Physiatrist is providing close team supervision and 24 hour management of active medical problems listed below.  Physiatrist and rehab team continue to assess barriers to  discharge/monitor patient progress toward functional and medical goals  Care Tool:  Bathing    Body parts bathed by patient: Right arm, Left arm, Chest, Abdomen, Front perineal area, Right upper leg, Left upper leg, Face   Body parts bathed by helper: Buttocks, Right lower leg, Left lower leg     Bathing assist Assist Level: Maximal Assistance - Patient 24 - 49%     Upper Body Dressing/Undressing Upper body dressing   What is the patient wearing?: Pull over shirt    Upper body assist Assist Level: Supervision/Verbal cueing    Lower Body Dressing/Undressing Lower body dressing      What is the patient wearing?: Underwear/pull up, Pants     Lower body assist Assist for lower body dressing: Total Assistance - Patient < 25%(Using STEDY for sit>stand)     Toileting Toileting    Toileting assist Assist for toileting: Maximal Assistance - Patient 25 - 49%(Using STEDY)     Transfers Chair/bed transfer  Transfers assist     Chair/bed transfer assist level: Dependent - mechanical lift(STEDY)     Locomotion Ambulation   Ambulation assist              Walk 10 feet activity   Assist           Walk 50 feet activity   Assist           Walk 150 feet activity   Assist  Walk 10 feet on uneven surface  activity   Assist           Wheelchair     Assist               Wheelchair 50 feet with 2 turns activity    Assist            Wheelchair 150 feet activity     Assist          Medical Problem List and Plan: 1.  Deficits with mobility, endurance, self-care secondary to thoracic myelopathy/metastatic prostate cancer status post decompression .  Patient is beginning CIR therapies today including PT and OT  2.  Antithrombotics: -DVT/anticoagulation:  Mechanical: Sequential compression devices, below knee Bilateral lower extremities             -antiplatelet therapy: N/A 3. Pain Management:  Hydrocodone prn effective.  4. Mood: LCSW to follow for evaluation and support.              -antipsychotic agents: N/A 5. Neuropsych: This patient is capable of making decisions on his own behalf. 6. Skin/Wound Care: Monitor  Incision daily for healing.  7. Fluids/Electrolytes/Nutrition: encourage po  -I personally reviewed the patient's labs today.  Glucose high  -protein supp for low albumin 8. HTN: Monitor BP tid--continue Coreg and Norvasc. controlled  Patient Vitals for the past 24 hrs:  BP Temp Temp src Pulse Resp SpO2 Height Weight  03/12/19 0550 127/78 98.4 F (36.9 C) - 81 18 99 % - 87.2 kg  03/11/19 2018 125/73 (!) 97.5 F (36.4 C) Oral 75 18 100 % 6' (1.829 m) -  03/11/19 1902 - - - - - - - 88.9 kg    9. Neuropathy: Reports improvement in sensation BLE---almost normal in fact.  - Continue Neurontin at bedtime.   Monitor pain with increased mobility. 10. ?neurogenic bowel/bladder  -observe voiding habits, scan for PVR's  -bowel regimen 11. Hyperglycemia:  -wean steroids  -monitor CBG's BID. No SSI covg at this point  LOS: 1 days A FACE TO FACE EVALUATION WAS PERFORMED  Meredith Staggers 03/12/2019, 9:22 AM

## 2019-03-12 NOTE — Progress Notes (Signed)
Inpatient Rehabilitation  Patient information reviewed and entered into eRehab system by Zykeriah Mathia M. Valmai Vandenberghe, M.A., CCC/SLP, PPS Coordinator.  Information including medical coding, functional ability and quality indicators will be reviewed and updated through discharge.    

## 2019-03-12 NOTE — Evaluation (Signed)
Occupational Therapy Assessment and Plan  Patient Details  Name: Dennis Fischer MRN: [id number] Date of Birth: 05-Nov-1945  OT Diagnosis: ataxia, muscle weakness (generalized) and paraparesis at level T7-T9 Rehab Potential: Rehab Potential (ACUTE ONLY): Good ELOS: 2.5-3 weeks   Today's Date: 03/12/2019 OT Individual Time: 4010-2725 and 1415-1445 OT Individual Time Calculation (min): 30 min   And 30 min  Problem List:  Patient Active Problem List   Diagnosis Date Noted  . Metastatic cancer to spine ([place]) 03/11/2019  . Neuropathic pain   . Benign essential HTN   . Cocaine use 03/10/2019  . Cord compression myelopathy ([place])   . Spinal cord compression ([place]) 03/09/2019  . Prostate cancer metastatic to bone ([place]) 12/22/2018  . Port-A-Cath in place 07/02/2018  . Goals of care, counseling/discussion 01/20/2018  . Right knee pain 01/17/2018  . Prostate cancer ([place]) 01/22/2017  . Prediabetes 03/30/2016  . Positive FIT (fecal immunochemical test) 11/22/2015  . GERD (gastroesophageal reflux disease) 12/21/2014  . Age-related nuclear cataract of both eyes 01/13/2013  . Hypertensive retinopathy 01/13/2013  . Essential (primary) hypertension 11/25/2012  . Pure hypercholesterolemia 11/25/2012  . History of total hip replacement 08/28/2010  . Tobacco dependence syndrome 03/22/2006    Past Medical History:  Past Medical History:  Diagnosis Date  . Hypertension   . Metastatic cancer to bone ([place]) dx'd 2018  . Prostate cancer [facility]    Past Surgical History:  Past Surgical History:  Procedure Laterality Date  . HIP SURGERY Bilateral   . IR IMAGING GUIDED PORT INSERTION  04/22/2018  . LAMINECTOMY N/A 03/09/2019   Procedure: THORACIC SEVEN - THORACIC EIGHT - THORACIC NINE LAMINECTOMY WITH RESECTION OF EPIDURAL TUMOR;  Surgeon: Earnie Larsson, MD;  Location: [facility];  Service: Neurosurgery;  Laterality: N/A;    Assessment & Plan Clinical Impression: Dennis Fischer is a 73 year old male with  history of HTN, prostate cancer undergoing chemo, severe neuropathy with progressive weakness BLE for past three months progressing to inability to walk and multiple falls.  History taken from chart review and patient.  Work-up revealed metastatic lesions to his scapula, right fifth and seventh ribs as well as thoracic spine.  MRI thoracic spine done by [name] for work up 03/09/2019 showed cord compression at T4 and T8 with significant osseous metastatic disease in mid thoracic spine with involvement of vertebral bodies from T4-T8.  Dr. Annette Stable consulted and he was taken to OR on 03/10/2023 T7-9 decompressive laminectomies with resection of epidural tumor.  Postop, he was started on Decadron. BLE strength reported to be improving. Spinal drain removed today and steroids discontinued? Therapy ongoing and CIR recommended due to functional decline.  Patient transferred to CIR on 03/11/2019 .    Patient currently requires max with basic self-care skills secondary to muscle weakness and muscle paralysis, decreased cardiorespiratoy endurance, ataxia and decreased coordination and decreased sitting balance, decreased standing balance, decreased postural control, decreased balance strategies and difficulty maintaining precautions.  Prior to one month before hospitalization, patient could complete ADLs independently. Pt with progressive LE weakness over the past month and has recently been w/c bound requiring assist for ADLs and mobility.    Patient will benefit from skilled intervention to decrease level of assist with basic self-care skills and increase independence with basic self-care skills prior to discharge home with care partner.  Anticipate patient will require minimal physical assistance and follow up home health.  OT - End of Session Activity Tolerance: Tolerates 10 - 20 min activity with multiple  rests Endurance Deficit: Yes Endurance Deficit Description: Requires rest breaks throughout ADL bathing/dressing  session OT Assessment Rehab Potential (ACUTE ONLY): Good OT Barriers to Discharge: Pending chemo/radiation OT Patient demonstrates impairments in the following area(s): Balance;Safety;Sensory;Skin Integrity;Endurance;Motor;Pain;Perception OT Basic ADL's Functional Problem(s): Grooming;Bathing;Dressing;Toileting OT Transfers Functional Problem(s): Toilet OT Additional Impairment(s): None OT Plan OT Intensity: Minimum of 1-2 x/day, 45 to 90 minutes OT Frequency: 5 out of 7 days OT Duration/Estimated Length of Stay: 2.5-3 weeks OT Treatment/Interventions: Balance/vestibular training;Discharge planning;Functional electrical stimulation;Pain management;Self Care/advanced ADL retraining;Therapeutic Activities;UE/LE Coordination activities;Disease mangement/prevention;Functional mobility training;Patient/family education;Skin care/wound managment;Therapeutic Exercise;UE/LE Strength taining/ROM;Wheelchair propulsion/positioning;DME/adaptive equipment instruction;Community reintegration;Neuromuscular re-education;Psychosocial support OT Self Feeding Anticipated Outcome(s): Indep OT Basic Self-Care Anticipated Outcome(s): Supervision-min A OT Toileting Anticipated Outcome(s): Min A OT Bathroom Transfers Anticipated Outcome(s): Min A OT Recommendation Recommendations for Other Services: Neuropsych consult Patient destination: Home Follow Up Recommendations: Home health OT Equipment Recommended: To be determined   Skilled Therapeutic Intervention Session One: Pt seen for OT eval and ADL bathing/dressing session. Pt in supine upon arrival, denying pain and agreeable to tx session. He transferred to sitting EOB with min A using hospital bed functions. Completed x2 sit>stand from elevated bed with RW. Pt unable to maintain R foot placement upon coming into standing with R LE sliding out in front of him. Unsafe to attempt transfer therefore used STEDY with mod A to power into standing in STEDY. Transferred  onto toilet with max A required for controlled descent onto low toilet. Total A for toileting task. Pt unable to have BM despite having urge. Pt anxious about leaving toilet as thinking he may still need to urgently go. Therefore, completed bathing/dressing routine seated on toilet. Set-up/supervision UB bathing/dressing. Max A LB bathing/dressing due to back pre-cautions and limited LE ROM. Sit>stand from [facility] with mod A and pt able to assist with pulling pants up while standing in STEDY.  Pt returned to sitting in recliner at end of session, all needs in reach. Education provided throughout session regarding role of OT, POC, OT goals, back pre-cautions, continuum of care, DME, and d/c planning.   Session Two: Pt seen for OT session focusing on education using AE. Pt sitting up in recliner upon arrival, agreeable to tx session and denying pain.  Education and demonstration provided regarding use of reacher, sock aid and LH shoe horn. Pt able to return demonstrate use to doff B shoes and don B socks. He requires increased assist with donning shoes due to B LE ataxia, unable to maintain foot position and stabilization needed to don shoes.  Pt very excited as he was able to don socks independently for the first time in several months. He remains very motivated to regain functional independence and reduce caregiver burden. Education/discussion throughout session regarding activity progression, OT/PT goals, CLOF and functional deficits, reducing caregiver burden and d/c planning.    OT Evaluation Precautions/Restrictions  Precautions Precautions: Fall Restrictions Weight Bearing Restrictions: No General Chart Reviewed: Yes Home Living/Prior Functioning Home Living Family/patient expects to be discharged to:: Private residence Living Arrangements: Spouse/significant other Available Help at Discharge: Family, Available 24 hours/day Type of Home: Apartment Home Access: Stairs to enter [employer] of Steps: 3 Entrance Stairs-Rails: None Home Layout: One level Bathroom Shower/Tub: Chiropodist: Standard Bathroom Accessibility: No Additional Comments: w/c will not fit into bathroom, can fit in remainder of apartment  Lives With: Spouse IADL History Homemaking Responsibilities: Yes Current License: Yes Occupation: Retired Prior Function Level of Independence: Needs assistance with gait, Needs  assistance with tranfers, Needs assistance with ADLs(Pt requiring increase of assist over the past month and using w/c. Independent prior to several months ago)  Able to Take Stairs?: Yes Driving: Yes Vocation: Retired Comments: Patient was Mod I RW until 1 month ago and now w/c level Vision Baseline Vision/History: Glaucoma;Wears glasses Wears Glasses: At all times Patient Visual Report: No change from baseline Vision Assessment?: No apparent visual deficits Perception  Perception: Within Functional Limits Praxis Praxis: Intact Cognition Overall Cognitive Status: Within Functional Limits for tasks assessed Arousal/Alertness: Awake/alert Orientation Level: Person;Place;Situation Person: Oriented Place: Oriented Situation: Oriented Year: 2020 Month: July Day of Week: Incorrect Memory: Appears intact Immediate Memory Recall: Sock;Blue;Bed Memory Recall Sock: Without Cue Memory Recall Blue: Without Cue Memory Recall Bed: Without Cue Attention: Focused Focused Attention: Appears intact Awareness: Appears intact Problem Solving: Appears intact Safety/Judgment: Appears intact Sensation Sensation Light Touch: Impaired Detail Light Touch Impaired Details: Impaired RLE;Impaired LLE Proprioception: Impaired Detail Proprioception Impaired Details: Impaired RLE;Impaired LLE Coordination Gross Motor Movements are Fluid and Coordinated: No Fine Motor Movements are Fluid and Coordinated: Yes Coordination and Movement Description: B LE ataxia and  generalized weakness Motor  Motor Motor: Ataxia;Paraplegia Motor - Skilled Clinical Observations: B LE ataxia  Trunk/Postural Assessment  Cervical Assessment Cervical Assessment: Within Functional Limits Thoracic Assessment Thoracic Assessment: Exceptions to WFL(Rounded shoulders; kyphotic) Lumbar Assessment Lumbar Assessment: Exceptions to WFL(Posterior pelvic tilt) Postural Control Postural Control: Deficits on evaluation  Balance Balance Balance Assessed: Yes Static Sitting Balance Static Sitting - Balance Support: Feet supported;No upper extremity supported Static Sitting - Level of Assistance: 5: Stand by assistance Static Sitting - Comment/# of Minutes: Sitting EOB Dynamic Sitting Balance Dynamic Sitting - Balance Support: During functional activity;Feet supported;No upper extremity supported Dynamic Sitting - Level of Assistance: 5: Stand by assistance;4: Min assist Sitting balance - Comments: Sitting on toilet while dressing Static Standing Balance Static Standing - Balance Support: During functional activity;Bilateral upper extremity supported Static Standing - Level of Assistance: 3: Mod assist;4: Min assist Static Standing - Comment/# of Minutes: Standing with STEDY requiring B UE support Dynamic Standing Balance Dynamic Standing - Balance Support: During functional activity;Right upper extremity supported;Left upper extremity supported Dynamic Standing - Level of Assistance: 3: Mod assist Dynamic Standing - Comments: Standing on STEDY attempting to pull pants up Extremity/Trunk Assessment RUE Assessment RUE Assessment: Within Functional Limits LUE Assessment LUE Assessment: Within Functional Limits     Refer to Care Plan for Long Term Goals  Recommendations for other services: Neuropsych   Discharge Criteria: Patient will be discharged from OT if patient refuses treatment 3 consecutive times without medical reason, if treatment goals not met, if there is a  change in medical status, if patient makes no progress towards goals or if patient is discharged from hospital.  The above assessment, treatment plan, treatment alternatives and goals were discussed and mutually agreed upon: by patient  Jasir Rother L 03/12/2019, 3:25 PM

## 2019-03-12 NOTE — Progress Notes (Signed)
Patient admitted to the unit and oriented to safety plan. Patient had no further questions at this time.

## 2019-03-12 NOTE — Evaluation (Signed)
Physical Therapy Assessment and Plan  Patient Details  Name: Dennis Fischer MRN: [id number] Date of Birth: 06/20/1946  PT Diagnosis: Ataxia, Difficulty walking, Impaired sensation, Muscle weakness and Paraplegia Rehab Potential: Good ELOS: 14-17 days   Today's Date: 03/12/2019 PT Individual Time: 1300-1400 PT Individual Time Calculation (min): 60 min    Problem List:  Patient Active Problem List   Diagnosis Date Noted  . Metastatic cancer to spine ([place]) 03/11/2019  . Neuropathic pain   . Benign essential HTN   . Cocaine use 03/10/2019  . Cord compression myelopathy ([place])   . Spinal cord compression ([place]) 03/09/2019  . Prostate cancer metastatic to bone ([place]) 12/22/2018  . Port-A-Cath in place 07/02/2018  . Goals of care, counseling/discussion 01/20/2018  . Right knee pain 01/17/2018  . Prostate cancer ([place]) 01/22/2017  . Prediabetes 03/30/2016  . Positive FIT (fecal immunochemical test) 11/22/2015  . GERD (gastroesophageal reflux disease) 12/21/2014  . Age-related nuclear cataract of both eyes 01/13/2013  . Hypertensive retinopathy 01/13/2013  . Essential (primary) hypertension 11/25/2012  . Pure hypercholesterolemia 11/25/2012  . History of total hip replacement 08/28/2010  . Tobacco dependence syndrome 03/22/2006    Past Medical History:  Past Medical History:  Diagnosis Date  . Hypertension   . Metastatic cancer to bone ([place]) dx'd 2018  . Prostate cancer [facility])    Past Surgical History:  Past Surgical History:  Procedure Laterality Date  . HIP SURGERY Bilateral   . IR IMAGING GUIDED PORT INSERTION  04/22/2018  . LAMINECTOMY N/A 03/09/2019   Procedure: THORACIC SEVEN - THORACIC EIGHT - THORACIC NINE LAMINECTOMY WITH RESECTION OF EPIDURAL TUMOR;  Surgeon: Earnie Larsson, MD;  Location: [place];  Service: Neurosurgery;  Laterality: N/A;    Assessment & Plan Clinical Impression:  Dennis Fischer is a 73 year old male with history of HTN, prostate cancer undergoing chemo,  severe neuropathy with progressive weakness BLE for past three months progressing to inability to walk and multiple falls.  History taken from chart review and patient.  Work-up revealed metastatic lesions to his scapula, right fifth and seventh ribs as well as thoracic spine.  MRI thoracic spine done by [name] for work up 03/09/2019 showed cord compression at T4 and T8 with significant osseous metastatic disease in mid thoracic spine with involvement of vertebral bodies from T4-T8.  Dr. Annette Stable consulted and he was taken to OR on 03/10/2023 T7-9 decompressive laminectomies with resection of epidural tumor.  Postop, he was started on Decadron. BLE strength reported to be improving. Spinal drain removed today and steroids discontinued? Therapy ongoing and CIR recommended due to functional decline.  Please see preadmission assessment from earlier today as well. Patient transferred to CIR on 03/11/2019 .   Patient currently requires total with mobility secondary to muscle weakness, abnormal tone, unbalanced muscle activation and ataxia and decreased sitting balance, decreased standing balance, decreased postural control and decreased balance strategies.  Prior to hospitalization, patient was modified independent  with mobility and lived with Significant other([name]) in a [place] home.  Home access is 3Stairs to enter.  Patient will benefit from skilled PT intervention to maximize safe functional mobility, minimize fall risk and decrease caregiver burden for planned discharge home with 24 hour assist.  Anticipate patient will benefit from follow up [facility] at discharge.  PT - End of Session Activity Tolerance: Tolerates 30+ min activity with multiple rests Endurance Deficit: Yes Endurance Deficit Description: Frequent rest breaks PT Assessment Rehab Potential (ACUTE/IP ONLY): Good PT Barriers to Discharge: Medical  stability;Home environment access/layout;Pending chemo/radiation PT Patient demonstrates impairments  in the following area(s): Balance;Endurance;Motor;Safety;Sensory PT Transfers Functional Problem(s): Bed Mobility;Bed to Chair;Car;Furniture;Floor PT Locomotion Functional Problem(s): Ambulation;Wheelchair Mobility;Stairs PT Plan PT Intensity: Minimum of 1-2 x/day ,45 to 90 minutes PT Frequency: 5 out of 7 days PT Duration Estimated Length of Stay: 14-17 days PT Treatment/Interventions: Ambulation/gait training;Balance/vestibular training;Community reintegration;Discharge planning;Disease management/prevention;DME/adaptive equipment instruction;Functional electrical stimulation;Functional mobility training;Neuromuscular re-education;Pain management;Patient/family education;Psychosocial support;Splinting/orthotics;Stair training;Therapeutic Activities;Therapeutic Exercise;UE/LE Strength taining/ROM;UE/LE Coordination activities;Wheelchair propulsion/positioning PT Transfers Anticipated Outcome(s): min A PT Locomotion Anticipated Outcome(s): mod I at w/c level, min A short distance gait PT Recommendation Recommendations for Other Services: Neuropsych consult;Therapeutic Recreation consult Therapeutic Recreation Interventions: Stress management Follow Up Recommendations: Home health PT Patient destination: Home Equipment Recommended: To be determined Equipment Details: Pt already owns RW and w/c  Skilled Therapeutic Intervention Evaluation completed (see details above and below) with education on PT POC and goals and individual treatment initiated with focus on functional transfer assessment. Pt received seated in bed, agreeable to PT eval. No complaints of pain. Pt reports urge to have a BM. Supine to sit with min A with HOB elevated and use of bedrail. Sit to stand to stedy with min A from elevated bed. Stedy transfer bed to toilet. Pt is max A for clothing management and setup A for pericare following toileting. Stedy transfer to w/c Manual w/c propulsion x 150 ft with use of BUE and Supervision.  Pt requests to return to recliner at end of session. Stedy transfer w/c to recliner. Pt left seated in recliner in care of NT at end of session.  PT Evaluation Precautions/Restrictions Precautions Precautions: Fall Restrictions Weight Bearing Restrictions: No Pain Pain Assessment Pain Scale: 0-10 Pain Score: 0-No pain Home Living/Prior Functioning Home Living Available Help at Discharge: Family;Available 24 hours/day Type of Home: Apartment Home Access: Stairs to enter [employer] of Steps: 3 Entrance Stairs-Rails: None Home Layout: One level Bathroom Shower/Tub: Chiropodist: Standard Bathroom Accessibility: No Additional Comments: w/c will not fit into bathroom, can fit in remainder of apartment  Lives With: Significant other([name]) Prior Function Level of Independence: Needs assistance with gait;Needs assistance with tranfers(using a RW then progressively got weaker then w/c bound)  Able to Take Stairs?: Yes Driving: No Vocation: Retired Comments: Patient was Mod I RW until 1 month ago and now w/c level Vision/Perception  Vision - History Baseline Vision: Wears glasses all the time Perception Perception: Within Functional Limits Praxis Praxis: Intact  Cognition Overall Cognitive Status: Within Functional Limits for tasks assessed Arousal/Alertness: Awake/alert Orientation Level: Oriented X4 Attention: Focused Focused Attention: Appears intact Memory: Appears intact Awareness: Appears intact Problem Solving: Appears intact Safety/Judgment: Appears intact Sensation Sensation Light Touch: Impaired Detail Light Touch Impaired Details: Impaired RLE;Impaired LLE Proprioception: Impaired Detail Proprioception Impaired Details: Impaired RLE;Impaired LLE Coordination Gross Motor Movements are Fluid and Coordinated: No Fine Motor Movements are Fluid and Coordinated: Yes Coordination and Movement Description: B LE ataxia and generalized  weakness Motor  Motor Motor: Ataxia;Paraplegia Motor - Skilled Clinical Observations: B LE ataxia  Mobility Bed Mobility Bed Mobility: Rolling Right;Rolling Left;Supine to Sit;Sit to Supine Rolling Right: Minimal Assistance - Patient > 75% Rolling Left: Minimal Assistance - Patient > 75% Supine to Sit: Minimal Assistance - Patient > 75% Sit to Supine: Minimal Assistance - Patient > 75% Transfers Transfers: Sit to [employer] via Geophysicist/field seismologist Sit to Stand: Dependent - Administrator, Civil Service via Lift Equipment: Haematologist / Additional Locomotion Stairs: No Product manager  Mobility: Yes Wheelchair Assistance: Chartered loss adjuster: Both upper extremities Wheelchair Parts Management: Needs assistance Distance: 150  Trunk/Postural Assessment  Cervical Assessment Cervical Assessment: Within Functional Limits Thoracic Assessment Thoracic Assessment: Exceptions to WFL(rounded shoulders; kyphotic) Lumbar Assessment Lumbar Assessment: Exceptions to WFL(posterior pelvic tilt) Postural Control Postural Control: Deficits on evaluation(ataxia in standing)  Balance Balance Balance Assessed: Yes Static Sitting Balance Static Sitting - Balance Support: No upper extremity supported;Feet supported Static Sitting - Level of Assistance: 5: Stand by assistance Static Sitting - Comment/# of Minutes: Sitting EOB Dynamic Sitting Balance Dynamic Sitting - Balance Support: No upper extremity supported;Feet supported;During functional activity Dynamic Sitting - Level of Assistance: 4: Min assist Sitting balance - Comments: Sitting on toilet while dressing Static Standing Balance Static Standing - Balance Support: During functional activity;Bilateral upper extremity supported Static Standing - Level of Assistance: 4: Min assist Static Standing - Comment/# of Minutes: Standing with STEDY requiring B UE support Dynamic Standing  Balance Dynamic Standing - Balance Support: During functional activity;Right upper extremity supported;Left upper extremity supported Dynamic Standing - Level of Assistance: 3: Mod assist Dynamic Standing - Comments: Standing on STEDY attempting to pull pants up Extremity Assessment  RUE Assessment RUE Assessment: Within Functional Limits LUE Assessment LUE Assessment: Within Functional Limits RLE Assessment RLE Assessment: Exceptions to [facility] Passive Range of Motion (PROM) Comments: [facility] General Strength Comments: impaired, see below RLE Strength Right Hip Flexion: 3+/5 Right Knee Flexion: 3-/5 Right Knee Extension: 4/5 Right Ankle Dorsiflexion: 4/5 LLE Assessment LLE Assessment: Exceptions to [facility] Passive Range of Motion (PROM) Comments: WFL General Strength Comments: impaired, see below LLE Strength Left Hip Flexion: 3/5 Left Knee Flexion: 3-/5 Left Knee Extension: 3+/5 Left Ankle Dorsiflexion: 3/5    Refer to Care Plan for Long Term Goals  Recommendations for other services: Neuropsych and Therapeutic Recreation  Stress management  Discharge Criteria: Patient will be discharged from PT if patient refuses treatment 3 consecutive times without medical reason, if treatment goals not met, if there is a change in medical status, if patient makes no progress towards goals or if patient is discharged from hospital.  The above assessment, treatment plan, treatment alternatives and goals were discussed and mutually agreed upon: by patient   Excell Seltzer, PT, DPT 03/12/2019, 4:41 PM

## 2019-03-12 NOTE — Progress Notes (Signed)
Lower extremity venous has been completed.   Preliminary results in CV Proc.   Abram Sander 03/12/2019 4:24 PM

## 2019-03-13 ENCOUNTER — Inpatient Hospital Stay (HOSPITAL_COMMUNITY): Payer: Medicare Other | Admitting: Occupational Therapy

## 2019-03-13 ENCOUNTER — Inpatient Hospital Stay (HOSPITAL_COMMUNITY): Payer: Medicare Other | Admitting: Physical Therapy

## 2019-03-13 LAB — CBC
HCT: 25.5 % — ABNORMAL LOW (ref 39.0–52.0)
Hemoglobin: 8.6 g/dL — ABNORMAL LOW (ref 13.0–17.0)
MCH: 30.2 pg (ref 26.0–34.0)
MCHC: 33.7 g/dL (ref 30.0–36.0)
MCV: 89.5 fL (ref 80.0–100.0)
Platelets: 182 10*3/uL (ref 150–400)
RBC: 2.85 MIL/uL — ABNORMAL LOW (ref 4.22–5.81)
RDW: 18.1 % — ABNORMAL HIGH (ref 11.5–15.5)
WBC: 6.9 10*3/uL (ref 4.0–10.5)
nRBC: 0 % (ref 0.0–0.2)

## 2019-03-13 LAB — GLUCOSE, CAPILLARY
Glucose-Capillary: 124 mg/dL — ABNORMAL HIGH (ref 70–99)
Glucose-Capillary: 156 mg/dL — ABNORMAL HIGH (ref 70–99)
Glucose-Capillary: 157 mg/dL — ABNORMAL HIGH (ref 70–99)

## 2019-03-13 MED ORDER — DEXAMETHASONE 4 MG PO TABS: 6.0000 mg | ORAL_TABLET | Freq: Four times a day (QID) | ORAL | Status: DC

## 2019-03-13 MED ORDER — SENNOSIDES-DOCUSATE SODIUM 8.6-50 MG PO TABS
2.0000 | ORAL_TABLET | Freq: Every day | ORAL | Status: DC
  Administered 2019-03-13 – 2019-03-26 (×13): 2 via ORAL
  Filled 2019-03-13 (×14): qty 2

## 2019-03-13 MED ORDER — DEXAMETHASONE 4 MG PO TABS
6.0000 mg | ORAL_TABLET | Freq: Four times a day (QID) | ORAL | Status: DC
  Administered 2019-03-14 (×3): 6 mg via ORAL
  Filled 2019-03-13 (×5): qty 1

## 2019-03-13 NOTE — Progress Notes (Addendum)
[place] PHYSICAL MEDICINE & REHABILITATION PROGRESS NOTE   Subjective/Complaints: No new problems. Feels better after emptying bowels. Pain under control. Feels stronger  ROS: Patient denies fever, rash, sore throat, blurred vision, nausea, vomiting, diarrhea, cough, shortness of breath or chest pain,   headache, or mood change.    Objective:   Vas [place] Lower Extremity Venous (dvt)  Result Date: 03/12/2019  Lower Venous Study Indications: Paraplegia.  Comparison Study: No prior Performing Technologist: Abram Sander RVS  Examination Guidelines: A complete evaluation includes B-mode imaging, spectral Doppler, color Doppler, and power Doppler as needed of all accessible portions of each vessel. Bilateral testing is considered an integral part of a complete examination. Limited examinations for reoccurring indications may be performed as noted.  +---------+---------------+---------+-----------+----------+--------------+ RIGHT    CompressibilityPhasicitySpontaneityPropertiesSummary        +---------+---------------+---------+-----------+----------+--------------+ CFV      Full           Yes      Yes                                 +---------+---------------+---------+-----------+----------+--------------+ SFJ      Full                                                        +---------+---------------+---------+-----------+----------+--------------+ FV Prox  Full                                                        +---------+---------------+---------+-----------+----------+--------------+ FV Mid   Full                                                        +---------+---------------+---------+-----------+----------+--------------+ FV DistalFull                                                        +---------+---------------+---------+-----------+----------+--------------+ PFV      Full                                                         +---------+---------------+---------+-----------+----------+--------------+ POP      Full           Yes      Yes                                 +---------+---------------+---------+-----------+----------+--------------+ PTV      Full                                                        +---------+---------------+---------+-----------+----------+--------------+  PERO                                                  Not visualized +---------+---------------+---------+-----------+----------+--------------+   +---------+---------------+---------+-----------+----------+--------------+ LEFT     CompressibilityPhasicitySpontaneityPropertiesSummary        +---------+---------------+---------+-----------+----------+--------------+ CFV      Full           Yes      Yes                                 +---------+---------------+---------+-----------+----------+--------------+ SFJ      Full                                                        +---------+---------------+---------+-----------+----------+--------------+ FV Prox  Full                                                        +---------+---------------+---------+-----------+----------+--------------+ FV Mid   Full                                                        +---------+---------------+---------+-----------+----------+--------------+ FV DistalFull                                                        +---------+---------------+---------+-----------+----------+--------------+ PFV      Full                                                        +---------+---------------+---------+-----------+----------+--------------+ POP      Full           Yes      Yes                                 +---------+---------------+---------+-----------+----------+--------------+ PTV      Full                                                         +---------+---------------+---------+-----------+----------+--------------+ PERO  Not visualized +---------+---------------+---------+-----------+----------+--------------+     Summary: Right: There is no evidence of deep vein thrombosis in the lower extremity. No cystic structure found in the popliteal fossa. Left: There is no evidence of deep vein thrombosis in the lower extremity. No cystic structure found in the popliteal fossa.  *See table(s) above for measurements and observations.    Preliminary    Recent Labs    03/11/19 1330 03/12/19 0353  WBC 11.7* 8.1  HGB 9.3* 7.9*  HCT 27.6* 24.0*  PLT 232 173   Recent Labs    03/11/19 0500 03/12/19 0353  NA 140 142  K 3.7 3.9  CL 108 109  CO2 23 24  GLUCOSE 170* 158*  BUN 19 16  CREATININE 0.98 0.94  CALCIUM 8.6* 8.5*    Intake/Output Summary (Last 24 hours) at 03/13/2019 0911 Last data filed at 03/13/2019 0800 Gross per 24 hour  Intake 564 ml  Output 1625 ml  Net -1061 ml     Physical Exam: Vital Signs Blood pressure (!) 141/81, pulse 63, temperature 98.3 F (36.8 C), temperature source Oral, resp. rate 12, height 6' (1.829 m), weight 87.2 kg, SpO2 100 %.  Constitutional: No distress . Vital signs reviewed. HEENT: EOMI, oral membranes moist Neck: supple Cardiovascular: RRR without murmur. No JVD    Respiratory: CTA Bilaterally without wheezes or rales. Normal effort    GI: BS +, non-tender, non-distended  Musculoskeletal:     Comments: No edema or tenderness in LE's Neurological: He is alert.  Motor: RUE 5/5. LUE 5/5. RLE 4/5. LLE 4/5 No obvious sensory deficits. DTR's 1+ Skin: Skin is warm and dry.  Psychiatric: pleasant   Assessment/Plan: 1. 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  Physiatrist is providing close team  supervision and 24 hour management of active medical problems listed below.  Physiatrist and rehab team continue to assess barriers to discharge/monitor patient progress toward functional and medical goals  Care Tool:  Bathing    Body parts bathed by patient: Right arm, Left arm, Chest, Abdomen, Front perineal area, Right upper leg, Left upper leg, Face   Body parts bathed by helper: Buttocks, Right lower leg, Left lower leg     Bathing assist Assist Level: Maximal Assistance - Patient 24 - 49%     Upper Body Dressing/Undressing Upper body dressing   What is the patient wearing?: Pull over shirt    Upper body assist Assist Level: Supervision/Verbal cueing    Lower Body Dressing/Undressing Lower body dressing      What is the patient wearing?: Underwear/pull up, Pants     Lower body assist Assist for lower body dressing: Total Assistance - Patient < 25%(Using STEDY for sit>stand)     Toileting Toileting    Toileting assist Assist for toileting: Set up assist     Transfers Chair/bed transfer  Transfers assist     Chair/bed transfer assist level: Dependent - mechanical lift(stedy)     Locomotion Ambulation   Ambulation assist   Ambulation activity did not occur: Safety/medical concerns          Walk 10 feet activity   Assist  Walk 10 feet activity did not occur: Safety/medical concerns        Walk 50 feet activity   Assist Walk 50 feet with 2 turns activity did not occur: Safety/medical concerns         Walk 150  feet activity   Assist Walk 150 feet activity did not occur: Safety/medical concerns         Walk 10 feet on uneven surface  activity   Assist Walk 10 feet on uneven surfaces activity did not occur: Safety/medical concerns         Wheelchair     Assist Will patient use wheelchair at discharge?: Yes Type of Wheelchair: Manual    Wheelchair assist level: Supervision/Verbal cueing Max wheelchair distance: 150'     Wheelchair 50 feet with 2 turns activity    Assist        Assist Level: Supervision/Verbal cueing   Wheelchair 150 feet activity     Assist     Assist Level: Supervision/Verbal cueing    Medical Problem List and Plan: 1.  Deficits with mobility, endurance, self-care secondary to thoracic myelopathy/metastatic prostate cancer status post decompression .  -Continue CIR therapies including PT, OT    -decadron taper 2.  Antithrombotics: -DVT/anticoagulation:  Mechanical: Sequential compression devices, below knee Bilateral lower extremities             -antiplatelet therapy: N/A 3. Pain Management: Hydrocodone prn effective.  4. Mood: LCSW to follow for evaluation and support.              -antipsychotic agents: N/A 5. Neuropsych: This patient is capable of making decisions on his own behalf. 6. Skin/Wound Care: Monitor  Incision daily for healing.  7. Fluids/Electrolytes/Nutrition: encourage po   -protein supp for low albumin 8. HTN: Monitor BP tid--continue Coreg and Norvasc. controlled 7/3 Patient Vitals for the past 24 hrs:  BP Temp Temp src Pulse Resp SpO2 Weight  03/13/19 0437 (!) 141/81 98.3 F (36.8 C) Oral 63 12 100 % -  03/12/19 1920 121/70 98.8 F (37.1 C) Oral 69 17 98 % -  03/12/19 1902 - - - - - - 87.2 kg  03/12/19 1401 118/70 98.6 F (37 C) Oral 69 16 100 % -    9. Neuropathy: Reports improvement in sensation BLE---almost normal in fact.  - Continue Neurontin at bedtime.   Monitor pain with increased mobility. 10. ?neurogenic bowel/bladder  -moved bowels yesterday  -continue bowel regimen  -continent of urine 11. Hyperglycemia:  -weaning steroids  -monitor CBG's BID.    -cover with SSI CBG (last 3)  Recent Labs    03/12/19 1651 03/12/19 2118 03/13/19 0655  GLUCAP 144* 162* 124*    12. ABLA:  -hgb 7.9  -labs pending today  -no clinical signs of blood loss  LOS: 2 days A FACE TO FACE EVALUATION WAS PERFORMED  Meredith Staggers 03/13/2019, 9:11 AM

## 2019-03-13 NOTE — Progress Notes (Signed)
Occupational Therapy Session Note  Patient Details  Name: Dennis Fischer MRN: [id number] Date of Birth: [birth year]  Today's Date: 03/13/2019 OT Individual Time: 0459-9774 OT Individual Time Calculation (min): 55 min    Short Term Goals: Week 1:  OT Short Term Goal 1 (Week 1): Pt will transfer to toilet/BSC without use of mechanical lift OT Short Term Goal 2 (Week 1): Pt will don pants sit>stand with steadying assist using LRAD and AE PRN OT Short Term Goal 3 (Week 1): Pt will complete toileting task with mod A using LRAD in order to reduce caregiver burden OT Short Term Goal 4 (Week 1): Pt will bathe seated EOB/ w/c at sink with mod A using LRAD and AE PRN  Skilled Therapeutic Interventions/Progress Updates:    Pt seen for OT ADL bathing/dressing session. Pt in supie upon arrival, awake and agreeable to tx session, denying pain and ready for tx. Pt reporting having wet brief and needing to be changed. Pt's brief and all bedding saturated, pt unaware of how wet he was.  Doffed soiled brief and completed pericare hygiene in supine. New brief donned total A, pt rolling with supervision in rder to have it pulled up.  He transferred to sitting EOB with min A and VCs for sequencing. COmpleted min A squat pivot transfer into w/c with VCs for technique. Bathing/dressing completed from w/c level at sink. Set-up assist for UB bathing/dressing. Assist to obtain and maintain modified figure four position in order to wash LEs. He used reacher to thread LEs into pants with supervision/VCs. He was able to recall technique for using sock aid to don socks with set-up. Total A to don shoes.  He stood at sink with mod A, heavy reliance on UEs while therapist provided total A for buttock hygiene and to pull pants up.  He completed oral care with set-up from w/c level. He completed min-mod A squat pivot transfer to recliner at end of session. Left with all needs in reach.   Therapy Documentation Precautions:   Precautions Precautions: Fall Restrictions Weight Bearing Restrictions: No   Therapy/Group: Individual Therapy  [name] L 03/13/2019, 6:46 AM

## 2019-03-13 NOTE — Progress Notes (Signed)
Occupational Therapy Session Note  Patient Details  Name: Dennis Fischer MRN: [id number] Date of Birth: 1946/08/22  Today's Date: 03/13/2019 OT Individual Time: 1430-1525 OT Individual Time Calculation (min): 55 min    Short Term Goals: Week 1:  OT Short Term Goal 1 (Week 1): Pt will transfer to toilet/BSC without use of mechanical lift OT Short Term Goal 2 (Week 1): Pt will don pants sit>stand with steadying assist using LRAD and AE PRN OT Short Term Goal 3 (Week 1): Pt will complete toileting task with mod A using LRAD in order to reduce caregiver burden OT Short Term Goal 4 (Week 1): Pt will bathe seated EOB/ w/c at sink with mod A using LRAD and AE PRN  Skilled Therapeutic Interventions/Progress Updates:    Patient in bed, states that he is tired but willing to try some light activities.  Supine to SSP with CG/min A.  Patient's brief and bed linens wet with urine.  Completed sit to stand with stedy min a.  Mod a for changing brief and hygiene.  Completed sit to stand from w/c for clothing management with min/mod A.  Completed core and LB coordination activities with good tolerance.  SPT with RW w/c to bed with mod a, cues for sequencing and foot/walker placement.  He returned to supine position in bed with min a.  Bed alarm set and call bell in reach.    Therapy Documentation Precautions:  Precautions Precautions: Fall Restrictions Weight Bearing Restrictions: No General:   Vital Signs: Therapy Vitals Temp: 98.3 F (36.8 C) Temp Source: Oral Pulse Rate: 69 Resp: 18 BP: 121/71 Patient Position (if appropriate): Lying Oxygen Therapy SpO2: 99 % O2 Device: Room Air Pain: Pain Assessment Pain Scale: 0-10 Pain Score: 0-No pain   Other Treatments:     Therapy/Group: Individual Therapy  Carlos Levering 03/13/2019, 4:00 PM

## 2019-03-13 NOTE — Progress Notes (Signed)
Overall Plan of Care [facility]) Patient Details Name: Tammy Ericsson MRN: [id number] DOB: 10/29/1945  Admitting Diagnosis: <principal problem not specified>  Hospital Problems: Active Problems:   Metastatic cancer to spine [facility])     Functional Problem List: Nursing    PT Balance, Endurance, Motor, Safety, Sensory  OT Balance, Safety, Sensory, Skin Integrity, Endurance, Motor, Pain, Perception  SLP    TR         Basic ADL's: OT Grooming, Bathing, Dressing, Toileting     Advanced  ADL's: OT       Transfers: PT Bed Mobility, Bed to Chair, Car, [employer], Floor  OT Toilet     Locomotion: PT Ambulation, Emergency planning/management officer, Stairs     Additional Impairments: OT None  SLP        TR      Anticipated Outcomes Item Anticipated Outcome  Self Feeding Indep  Swallowing      Basic self-care  Supervision-min A  Toileting  Min A   Bathroom Transfers Min A  Bowel/Bladder     Transfers  min A  Locomotion  mod I at w/c level, min A short distance gait  Communication     Cognition     Pain     Safety/Judgment      Therapy Plan: PT Intensity: Minimum of 1-2 x/day ,45 to 90 minutes PT Frequency: 5 out of 7 days PT Duration Estimated Length of Stay: 14-17 days OT Intensity: Minimum of 1-2 x/day, 45 to 90 minutes OT Frequency: 5 out of 7 days OT Duration/Estimated Length of Stay: 2.5-3 weeks     Due to the current state of emergency, patients may not be receiving their 3-hours of Medicare-mandated therapy.   Team Interventions: Nursing Interventions    PT interventions Ambulation/gait training, Training and development officer, Community reintegration, Discharge planning, Disease management/prevention, DME/adaptive equipment instruction, Functional electrical stimulation, Functional mobility training, Neuromuscular re-education, Pain management, Patient/family education, Psychosocial support, Splinting/orthotics, Stair training, Therapeutic Activities, Therapeutic  Exercise, UE/LE Strength taining/ROM, UE/LE Coordination activities, Wheelchair propulsion/positioning  OT Interventions Balance/vestibular training, Discharge planning, Functional electrical stimulation, Pain management, Self Care/advanced ADL retraining, Therapeutic Activities, UE/LE Coordination activities, Disease mangement/prevention, Functional mobility training, Patient/family education, Skin care/wound managment, Therapeutic Exercise, UE/LE Strength taining/ROM, Wheelchair propulsion/positioning, DME/adaptive equipment instruction, Community reintegration, Neuromuscular re-education, Psychosocial support  SLP Interventions    TR Interventions    SW/CM Interventions     Barriers to Discharge MD  Medical stability  Nursing      PT Medical stability, Home environment access/layout, Pending chemo/radiation    OT Pending chemo/radiation    SLP      SW       Team Discharge Planning: Destination: PT-Home ,OT- Home , SLP-  Projected Follow-up: PT-Home health PT, OT-  Home health OT, SLP-  Projected Equipment Needs: PT-To be determined, OT- To be determined, SLP-  Equipment Details: PT-Pt already owns RW and w/c, OT-  Patient/family involved in discharge planning: PT- Patient,  OT-Patient, SLP-   MD ELOS: 14-17 days Medical Rehab Prognosis:  Excellent Assessment: The patient has been admitted for CIR therapies with the diagnosis of mets to thoracic spine with myelopathy. The team will be addressing functional mobility, strength, stamina, balance, safety, adaptive techniques and equipment, self-care, bowel and bladder mgt, patient and caregiver education, NMR, pain control, community reentry. Goals have been set at min assist for self-care, transfers and short dx gait, mod I with w/c locomotion.   Due to the current state of emergency, patients may not be receiving their  3 hours per day of Medicare-mandated therapy.    Meredith Staggers, MD, FAAPMR      See Team Conference Notes  for weekly updates to the plan of care

## 2019-03-13 NOTE — Progress Notes (Signed)
Physical Therapy Session Note  Patient Details  Name: Dennis Fischer MRN: [id number] Date of Birth: [birth year]  Today's Date: 03/13/2019 PT Individual Time: 1050-1200 PT Individual Time Calculation (min): 70 min   Short Term Goals: Week 1:  PT Short Term Goal 1 (Week 1): Pt will complete least restrictive transfer with mod A PT Short Term Goal 2 (Week 1): Pt will initiate gait training as safe and able PT Short Term Goal 3 (Week 1): Pt will perform bed mobility with Supervision  Skilled Therapeutic Interventions/Progress Updates:    Pt received seated in recliner in room, agreeable to PT session. No complaints of pain. Squat pivot transfer recliner to w/c with mod A. Manual w/c propulsion x 150 ft with use of BUE and Supervision. Squat pivot transfer w/c to mat table with min A. Sit to stand with mod A to RW throughout session. Focus on standing balance with RW and min to mod A with use of mirror for visual feedback. Pt relies heavily on BUE on RW for standing and exhibits posterior lean in standing. Multimodal cueing for glute activation for hip ext and upright posture. Standing mini-squats x 5 reps with RW and min A. Alt L/R marches x 5 reps with RW and mod A with decrease in balance noted with onset of fatigue. Standing alt L/R target taps with RW and min to mod A for standing balance, BLE ataxic and pt demos fair ability to control them in standing. Sit to supine min A for RLE management. Supine BLE NMR and strengthening exercises: bridges 3 x 10 reps, SKFO x 10 reps each with assist to control hip abd, resisted PNF diagonals on BLE x 10 reps each. Supine to sit with min A. Squat pivot transfer back to w/c with min A. Pt requests to return to bed at end of session and reports feeling fatigued following session. Squat pivot transfer back to bed mod A. Sit to supine CGA with use of bedrails. Pt left semi-reclined in bed with needs in reach at end of session.  Therapy Documentation Precautions:   Precautions Precautions: Fall Restrictions Weight Bearing Restrictions: No    Therapy/Group: Individual Therapy   Excell Seltzer, PT, DPT  03/13/2019, 12:30 PM

## 2019-03-13 NOTE — Progress Notes (Signed)
Social Work Assessment and Plan   Patient Details  Name: Dennis Fischer MRN: [id number] Date of Birth: 01/21/1946  Today's Date: 03/13/2019  Problem List:  Patient Active Problem List   Diagnosis Date Noted  . Transaminitis   . Hypoalbuminemia due to protein-calorie malnutrition ([place])   . Acute blood loss anemia   . Steroid-induced hyperglycemia   . Essential hypertension   . Labile blood pressure   . Metastatic cancer to spine ([place]) 03/11/2019  . Neuropathic pain   . Benign essential HTN   . Cocaine use 03/10/2019  . Cord compression myelopathy ([place])   . Spinal cord compression ([place]) 03/09/2019  . Prostate cancer metastatic to bone ([place]) 12/22/2018  . Port-A-Cath in place 07/02/2018  . Goals of care, counseling/discussion 01/20/2018  . Right knee pain 01/17/2018  . Prostate cancer ([place]) 01/22/2017  . Prediabetes 03/30/2016  . Positive FIT (fecal immunochemical test) 11/22/2015  . GERD (gastroesophageal reflux disease) 12/21/2014  . Age-related nuclear cataract of both eyes 01/13/2013  . Hypertensive retinopathy 01/13/2013  . Essential (primary) hypertension 11/25/2012  . Pure hypercholesterolemia 11/25/2012  . History of total hip replacement 08/28/2010  . Tobacco dependence syndrome 03/22/2006   Past Medical History:  Past Medical History:  Diagnosis Date  . Hypertension   . Metastatic cancer to bone ([place]) dx'd 2018  . Prostate cancer [facility])    Past Surgical History:  Past Surgical History:  Procedure Laterality Date  . HIP SURGERY Bilateral   . IR IMAGING GUIDED PORT INSERTION  04/22/2018  . LAMINECTOMY N/A 03/09/2019   Procedure: THORACIC SEVEN - THORACIC EIGHT - THORACIC NINE LAMINECTOMY WITH RESECTION OF EPIDURAL TUMOR;  Surgeon: Earnie Larsson, MD;  Location: [place];  Service: Neurosurgery;  Laterality: N/A;   Social History:  reports that he quit smoking about 4 years ago. His smoking use included cigarettes. He has a 12.50 pack-year smoking history. He has never  used smokeless tobacco. He reports current alcohol use. He reports current drug use. Drug: Marijuana.  Family / Support Systems Marital Status: (girlfriend of 11+ yrs) Patient Roles: Partner Spouse/Significant Other: girlfriend, Thurmond Butts @ [phone] Children: Pt notes he and girlfriend have adult children "but they're grown and don't live around here." Anticipated Caregiver: [name] Ability/Limitations of Caregiver: no limitations Caregiver Availability: 24/7 Family Dynamics: [name] very encouraging and states she will provide any assistance needed.  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   Abuse/Neglect Abuse/Neglect Assessment Can Be Completed: (P) Yes Physical Abuse: (P) Denies Verbal Abuse: (P) Denies Sexual Abuse: (P) Denies Exploitation of patient/patient's resources: (P) Denies Self-Neglect: (P) Denies  Emotional Status Pt's affect, behavior and adjustment status: (P) Pt sitting up in w/c and able to complete assessment interview without any difficulty.  He offers mostly brief answers. Has girlfriend, [name], on speaker phone throughout our discussion.  Pt states he is pleased with progress he is seeing and notes that he was "down for the past 3 months...but now it's getting better."  Will monitor mood and refer for neuropsychology if indicated. Recent Psychosocial Issues: (P) Declining function for ~  3 mos PTA. Psychiatric History: (P) None Substance Abuse History: (P) Did not address while gf on the phone.  Chart indicates pt + for cocaine on admission.  Will follow up.  Patient / Family  Perceptions, Expectations & Goals Pt/Family understanding of illness & functional limitations: Pt and [name] with good understanding of mets,  surgery performed and current functional limitations/ need for CIR. Premorbid pt/family roles/activities: As noted, pt with decline in function over the past few months and girlfriend providing physical assistance. Anticipated changes in roles/activities/participation: Little change if pt able to reach min assist goals overall. Pt/family expectations/goals: "I would like to be able to walk a little."  [employer]: Other (Comment)([place]) Premorbid Home Care/DME Agencies: None Transportation available at discharge: yes Resource referrals recommended: Neuropsychology  Discharge Planning Living Arrangements: Spouse/significant other Support Systems: Spouse/significant other Type of Residence: Private residence Insurance Resources: [employer], Multimedia programmer (specify)([employer]) Financial Resources: Radio broadcast assistant Screen Referred: No Living Expenses: Rent Money Management: Significant Other Does the patient have any problems obtaining your medications?: No Home Management: Pt and girlfriend share responsibilities. Patient/Family Preliminary Plans: Pt to d/c home with girlfriend, [name], to provide assistance. Social Work Anticipated Follow Up Needs: HH/OP Expected length of stay: 14-17 days  Clinical Impression Pleasant gentleman here following spinal surgery for cancer mets.  Pleased with progress he is seeing with therapies and denies any concerns about d/c.  He is aware that he will have further radiation.  May need some transportation assistance for this. Will follow for support and d/c planning needs.  Nichole Neyer 03/13/2019, 4:22 PM

## 2019-03-14 ENCOUNTER — Inpatient Hospital Stay (HOSPITAL_COMMUNITY): Payer: Medicare Other | Admitting: Occupational Therapy

## 2019-03-14 ENCOUNTER — Inpatient Hospital Stay (HOSPITAL_COMMUNITY): Payer: Medicare Other

## 2019-03-14 DIAGNOSIS — E46 Unspecified protein-calorie malnutrition: Secondary | ICD-10-CM

## 2019-03-14 DIAGNOSIS — E8809 Other disorders of plasma-protein metabolism, not elsewhere classified: Secondary | ICD-10-CM

## 2019-03-14 DIAGNOSIS — D62 Acute posthemorrhagic anemia: Secondary | ICD-10-CM

## 2019-03-14 DIAGNOSIS — R739 Hyperglycemia, unspecified: Secondary | ICD-10-CM

## 2019-03-14 DIAGNOSIS — M792 Neuralgia and neuritis, unspecified: Secondary | ICD-10-CM

## 2019-03-14 DIAGNOSIS — R0989 Other specified symptoms and signs involving the circulatory and respiratory systems: Secondary | ICD-10-CM

## 2019-03-14 DIAGNOSIS — I10 Essential (primary) hypertension: Secondary | ICD-10-CM

## 2019-03-14 LAB — GLUCOSE, CAPILLARY
Glucose-Capillary: 125 mg/dL — ABNORMAL HIGH (ref 70–99)
Glucose-Capillary: 118 mg/dL — ABNORMAL HIGH (ref 70–99)
Glucose-Capillary: 143 mg/dL — ABNORMAL HIGH (ref 70–99)
Glucose-Capillary: 179 mg/dL — ABNORMAL HIGH (ref 70–99)

## 2019-03-14 MED ORDER — DEXAMETHASONE 4 MG PO TABS
6.0000 mg | ORAL_TABLET | Freq: Four times a day (QID) | ORAL | Status: DC
  Administered 2019-03-14 – 2019-03-16 (×6): 6 mg via ORAL
  Filled 2019-03-14 (×6): qty 2

## 2019-03-14 NOTE — Progress Notes (Signed)
Physical Therapy Session Note  Patient Details  Name: Dennis Fischer MRN: [id number] Date of Birth: 29-May-1946  Today's Date: 03/14/2019 PT Individual Time: 1115-1200 PT Individual Time Calculation (min): 45 min   Short Term Goals: Week 1:  PT Short Term Goal 1 (Week 1): Pt will complete least restrictive transfer with mod A PT Short Term Goal 2 (Week 1): Pt will initiate gait training as safe and able PT Short Term Goal 3 (Week 1): Pt will perform bed mobility with Supervision  Skilled Therapeutic Interventions/Progress Updates:    PAtient in w/c in room and agreeable to PT.  Propelled in w/c with S.  Transfer to mat Squat pivot with mod A increased time and uncontrolled descent.  Patient seated for therex with 3# weights hip flexion, knee extension and hip abd/add.  Adjusted w/c to higher setting.  Patient sit to stand mod A to RW increased time with heavy UE support.  Three steps forward and back with weights on ankles for increased control.  Practiced scoot-pivot transfer mat<>w/c with min to minguard A and cues after demonstration.  Sit to stand for stepping forward and back over yardstick with hockey stick in middle for placement, foot clearance with 3# weights on ankles and min/mod A x 2 reps prior to fatigue.  Sit to stand for step taps to low balance beam with cue for using screws as targets on beam x 5 reps cues for decreased UE use.  Patient stand step to w/c with RW and min/mod A.  W/c mobility to room S and scoot-pivot to bed min A with NT in room to visualize technique.  Sit to supine min A for foot into bed and positioning.  Left with bed alarm on and call bell in place, NT in room for CBG.  Therapy Documentation Precautions:  Precautions Precautions: Fall Restrictions Weight Bearing Restrictions: No Pain: Pain Assessment Pain Score: 0-No pain    Therapy/Group: Individual Therapy  Reginia Naas  Magda Kiel, PT 03/14/2019, 11:44 AM

## 2019-03-14 NOTE — IPOC Note (Signed)
Individualized overall Plan of Care [facility]) Patient Details Name: Dennis Fischer MRN: [id number] DOB: March 31, 1946  Admitting Diagnosis: Thoracic myelopathy due to metastatic prostate cancer.   Hospital Problems: Active Problems:   Metastatic cancer to spine ([place])   Hypoalbuminemia due to protein-calorie malnutrition ([facility])   Acute blood loss anemia   Steroid-induced hyperglycemia   Essential hypertension   Labile blood pressure     Functional Problem List: Nursing    PT Balance, Endurance, Motor, Safety, Sensory  OT Balance, Safety, Sensory, Skin Integrity, Endurance, Motor, Pain, Perception  SLP    TR         Basic ADL's: OT Grooming, Bathing, Dressing, Toileting     Advanced  ADL's: OT       Transfers: PT Bed Mobility, Bed to Chair, Car, [employer], Floor  OT Toilet     Locomotion: PT Ambulation, Emergency planning/management officer, Stairs     Additional Impairments: OT None  SLP        TR      Anticipated Outcomes Item Anticipated Outcome  Self Feeding Indep  Swallowing      Basic self-care  Supervision-min A  Toileting  Min A   Bathroom Transfers Min A  Bowel/Bladder     Transfers  min A  Locomotion  mod I at w/c level, min A short distance gait  Communication     Cognition     Pain     Safety/Judgment      Therapy Plan: PT Intensity: Minimum of 1-2 x/day ,45 to 90 minutes PT Frequency: 5 out of 7 days PT Duration Estimated Length of Stay: 14-17 days OT Intensity: Minimum of 1-2 x/day, 45 to 90 minutes OT Frequency: 5 out of 7 days OT Duration/Estimated Length of Stay: 2.5-3 weeks      Team Interventions: Nursing Interventions    PT interventions Ambulation/gait training, Training and development officer, Community reintegration, Discharge planning, Disease management/prevention, DME/adaptive equipment instruction, Functional electrical stimulation, Functional mobility training, Neuromuscular re-education, Pain management, Patient/family education,  Psychosocial support, Splinting/orthotics, Stair training, Therapeutic Activities, Therapeutic Exercise, UE/LE Strength taining/ROM, UE/LE Coordination activities, Wheelchair propulsion/positioning  OT Interventions Training and development officer, Discharge planning, Functional electrical stimulation, Pain management, Self Care/advanced ADL retraining, Therapeutic Activities, UE/LE Coordination activities, Disease mangement/prevention, Functional mobility training, Patient/family education, Skin care/wound managment, Therapeutic Exercise, UE/LE Strength taining/ROM, Wheelchair propulsion/positioning, DME/adaptive equipment instruction, Community reintegration, Neuromuscular re-education, Psychosocial support  SLP Interventions    TR Interventions    SW/CM Interventions     Barriers to Discharge MD  Medical stability  Nursing      PT Medical stability, Home environment access/layout, Pending chemo/radiation    OT Pending chemo/radiation    SLP      SW       Team Discharge Planning: Destination: PT-Home ,OT- Home , SLP-  Projected Follow-up: PT-Home health PT, OT-  Home health OT, SLP-  Projected Equipment Needs: PT-To be determined, OT- To be determined, SLP-  Equipment Details: PT-Pt already owns RW and w/c, OT-  Patient/family involved in discharge planning: PT- Patient,  OT-Patient, SLP-   MD ELOS: 16-20 days. Medical Rehab Prognosis:  Good Assessment: 73 year old male with history of HTN, prostate cancer undergoing chemo, severe neuropathy with progressive weakness BLE for past three months progressing to inability to walk and multiple falls.  Work-up revealed metastatic lesions to his scapula, right fifth and seventh ribs as well as thoracic spine.  MRI thoracic spine done by [name] for work up 03/09/2019 showed cord compression at T4 and T8  with significant osseous metastatic disease in mid thoracic spine with involvement of vertebral bodies from T4-T8.  Dr. Annette Stable consulted and he was  taken to OR on 03/10/2023 T7-9 decompressive laminectomies with resection of epidural tumor.  Postop, he was started on Decadron. BLE strength reported to be improving. Spinal drain removed and steroids discontinued? Patient with resulting functional deficits with mobility, endurance, self-care.  Will set goals for Min A with PT/OT.  Due to the current state of emergency, patients may not be receiving their 3-hours of Medicare-mandated therapy.  See Team Conference Notes for weekly updates to the plan of care

## 2019-03-14 NOTE — Progress Notes (Signed)
[facility] PHYSICAL MEDICINE & REHABILITATION PROGRESS NOTE   Subjective/Complaints: Patient seen laying in bed this morning.  He states he slept well overnight.  He denies complaints.  ROS: Denies CP, SOB, N/V/D   Objective:   Vas [place] Lower Extremity Venous (dvt)  Result Date: 03/13/2019  Lower Venous Study Indications: Paraplegia.  Comparison Study: No prior Performing Technologist: Abram Sander RVS  Examination Guidelines: A complete evaluation includes B-mode imaging, spectral Doppler, color Doppler, and power Doppler as needed of all accessible portions of each vessel. Bilateral testing is considered an integral part of a complete examination. Limited examinations for reoccurring indications may be performed as noted.  +---------+---------------+---------+-----------+----------+--------------+ RIGHT    CompressibilityPhasicitySpontaneityPropertiesSummary        +---------+---------------+---------+-----------+----------+--------------+ CFV      Full           Yes      Yes                                 +---------+---------------+---------+-----------+----------+--------------+ SFJ      Full                                                        +---------+---------------+---------+-----------+----------+--------------+ FV Prox  Full                                                        +---------+---------------+---------+-----------+----------+--------------+ FV Mid   Full                                                        +---------+---------------+---------+-----------+----------+--------------+ FV DistalFull                                                        +---------+---------------+---------+-----------+----------+--------------+ PFV      Full                                                        +---------+---------------+---------+-----------+----------+--------------+ POP      Full           Yes      Yes                                  +---------+---------------+---------+-----------+----------+--------------+ PTV      Full                                                        +---------+---------------+---------+-----------+----------+--------------+  PERO                                                  Not visualized +---------+---------------+---------+-----------+----------+--------------+   +---------+---------------+---------+-----------+----------+--------------+ LEFT     CompressibilityPhasicitySpontaneityPropertiesSummary        +---------+---------------+---------+-----------+----------+--------------+ CFV      Full           Yes      Yes                                 +---------+---------------+---------+-----------+----------+--------------+ SFJ      Full                                                        +---------+---------------+---------+-----------+----------+--------------+ FV Prox  Full                                                        +---------+---------------+---------+-----------+----------+--------------+ FV Mid   Full                                                        +---------+---------------+---------+-----------+----------+--------------+ FV DistalFull                                                        +---------+---------------+---------+-----------+----------+--------------+ PFV      Full                                                        +---------+---------------+---------+-----------+----------+--------------+ POP      Full           Yes      Yes                                 +---------+---------------+---------+-----------+----------+--------------+ PTV      Full                                                        +---------+---------------+---------+-----------+----------+--------------+ PERO  Not visualized  +---------+---------------+---------+-----------+----------+--------------+     Summary: Right: There is no evidence of deep vein thrombosis in the lower extremity. No cystic structure found in the popliteal fossa. Left: There is no evidence of deep vein thrombosis in the lower extremity. No cystic structure found in the popliteal fossa.  *See table(s) above for measurements and observations. Electronically signed by Servando Snare MD on 03/13/2019 at 11:03:21 AM.    Final    Recent Labs    03/12/19 0353 03/13/19 1343  WBC 8.1 6.9  HGB 7.9* 8.6*  HCT 24.0* 25.5*  PLT 173 182   Recent Labs    03/12/19 0353  NA 142  K 3.9  CL 109  CO2 24  GLUCOSE 158*  BUN 16  CREATININE 0.94  CALCIUM 8.5*    Intake/Output Summary (Last 24 hours) at 03/14/2019 1529 Last data filed at 03/14/2019 1404 Gross per 24 hour  Intake 240 ml  Output 1250 ml  Net -1010 ml     Physical Exam: Vital Signs Blood pressure 120/76, pulse 64, temperature 98.6 F (37 C), temperature source Oral, resp. rate 16, height 6' (1.829 m), weight 86.6 kg, SpO2 99 %. Constitutional: No distress . Vital signs reviewed. HENT: Normocephalic.  Atraumatic. Eyes: EOMI. No discharge. Cardiovascular: No JVD. Respiratory: Normal effort. GI: Non-distended. Musc: No edema or tenderness in extremities. Neurological: He is alert.  Motor: Bilateral upper extremities: 5/5 proximal distal Bilateral lower extremities: Hip flexion, knee extension 4-/5, ankle dorsiflexion 4+/5  Skin: Skin is warm and dry.  Psychiatric: pleasant   Assessment/Plan: 1. 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  Physiatrist is providing close team supervision and 24 hour management of active medical problems listed below.  Physiatrist and rehab team continue to assess barriers to discharge/monitor patient progress  toward functional and medical goals  Care Tool:  Bathing    Body parts bathed by patient: Right arm, Left arm, Chest, Abdomen, Front perineal area, Right upper leg, Left upper leg, Face   Body parts bathed by helper: Buttocks, Right lower leg, Left lower leg     Bathing assist Assist Level: Moderate Assistance - Patient 50 - 74%     Upper Body Dressing/Undressing Upper body dressing   What is the patient wearing?: Pull over shirt    Upper body assist Assist Level: Set up assist    Lower Body Dressing/Undressing Lower body dressing      What is the patient wearing?: Pants     Lower body assist Assist for lower body dressing: Minimal Assistance - Patient > 75%     Toileting Toileting    Toileting assist Assist for toileting: Moderate Assistance - Patient 50 - 74%     Transfers Chair/bed transfer  Transfers assist     Chair/bed transfer assist level: Moderate Assistance - Patient 50 - 74%     Locomotion Ambulation   Ambulation assist   Ambulation activity did not occur: Safety/medical concerns  Assist level: Moderate Assistance - Patient 50 - 74% Assistive device: Walker-rolling Max distance: 2'   Walk 10 feet activity   Assist  Walk 10 feet activity did not occur: Safety/medical concerns        Walk 50 feet activity   Assist Walk 50 feet with 2 turns activity did not occur: Safety/medical concerns         Walk 150 feet activity   Assist Walk 150  feet activity did not occur: Safety/medical concerns         Walk 10 feet on uneven surface  activity   Assist Walk 10 feet on uneven surfaces activity did not occur: Safety/medical concerns         Wheelchair     Assist Will patient use wheelchair at discharge?: Yes Type of Wheelchair: Manual    Wheelchair assist level: Supervision/Verbal cueing Max wheelchair distance: 150'    Wheelchair 50 feet with 2 turns activity    Assist        Assist Level:  Supervision/Verbal cueing   Wheelchair 150 feet activity     Assist     Assist Level: Supervision/Verbal cueing    Medical Problem List and Plan: 1.  Deficits with mobility, endurance, self-care secondary to thoracic myelopathy/metastatic prostate cancer status post decompression .  Continue CIR  -decadron 6 mg every 6 hours on 7/4, continue to taper 2.  Antithrombotics: -DVT/anticoagulation:  Mechanical: Sequential compression devices, below knee Bilateral lower extremities             -antiplatelet therapy: N/A 3. Pain Management: Hydrocodone prn effective.  4. Mood: LCSW to follow for evaluation and support.              -antipsychotic agents: N/A 5. Neuropsych: This patient is capable of making decisions on his own behalf. 6. Skin/Wound Care: Monitor  Incision daily for healing.  7. Fluids/Electrolytes/Nutrition: encourage po 8. HTN: Monitor BP tid--continue Coreg and Norvasc.  Patient Vitals for the past 24 hrs:  BP Temp Temp src Pulse Resp SpO2 Weight  03/14/19 1405 120/76 98.6 F (37 C) Oral 64 16 99 % -  03/14/19 0859 (!) 147/84 - - 76 - - -  03/14/19 0521 140/89 98.1 F (36.7 C) Oral 63 15 99 % -  03/13/19 1951 125/73 98.6 F (37 C) Oral 68 16 99 % -  03/13/19 1902 - - - - - - 86.6 kg   Relatively controlled on 7/4 9. Neuropathy: Reports improvement in sensation BLE---almost normal in fact.  - Continue Neurontin at bedtime.   Monitor pain with increased mobility. 10. ?neurogenic bowel/bladder: Improving 11.  Steroid-induced hyperglycemia:  -weaning steroids  -monitor CBG's BID.    -cover with SSI CBG (last 3)  Recent Labs    03/13/19 2131 03/14/19 0636 03/14/19 1203  GLUCAP 157* 125* 118*    Labile on 7/4 12. ABLA:  Hemoglobin 8.6 on 7/3  Continue to monitor  -no clinical signs of blood loss 13.  Hypoalbuminemia  Protein supplementation  LOS: 3 days A FACE TO FACE EVALUATION WAS PERFORMED   Lorie Phenix 03/14/2019, 3:29 PM

## 2019-03-14 NOTE — Progress Notes (Signed)
Occupational Therapy Session Note  Patient Details  Name: Dennis Fischer MRN: [id number] Date of Birth: 1945-12-23  Today's Date: 03/14/2019 OT Individual Time: 1410-1520 OT Individual Time Calculation (min): 70 min    Short Term Goals: Week 1:  OT Short Term Goal 1 (Week 1): Pt will transfer to toilet/BSC without use of mechanical lift OT Short Term Goal 2 (Week 1): Pt will don pants sit>stand with steadying assist using LRAD and AE PRN OT Short Term Goal 3 (Week 1): Pt will complete toileting task with mod A using LRAD in order to reduce caregiver burden OT Short Term Goal 4 (Week 1): Pt will bathe seated EOB/ w/c at sink with mod A using LRAD and AE PRN  Skilled Therapeutic Interventions/Progress Updates:    Patient in bed, ready for therapy session.  He demonstrates good recall of prior sessions and conversations.  Supine to SSP with CS.  SPT with RW to/from bed, w/c, mat table with min/mod A and cues, tactile input for foot position and walker placement.  He is able to propel w/c to/from therapy gym.  Unsupported sitting on mat for 40 minutes with good carryover of back precautions, good seated balance and upright posture.  Completed UB stretching and conditioning activities without difficulty, LB coordination and mobility activities with moderate difficulty.  Sit to stand from mat with CG - min A.  Min - occ mod A to maintain balance in stance with dynamic activity such as reaching or tossing.  Worked on controlled decent to surface with increasing difficulty as fatigue became an issue.  Patient returned to bed at close of session, min a to doff pants and position himself in bed.  Bed alarm set and call bell in reach.    Therapy Documentation Precautions:  Precautions Precautions: Fall Restrictions Weight Bearing Restrictions: No General:   Vital Signs: Therapy Vitals Temp: 98.6 F (37 C) Temp Source: Oral Pulse Rate: 64 Resp: 16 BP: 120/76 Patient Position (if appropriate):  Lying Oxygen Therapy SpO2: 99 % O2 Device: Room Air Pain: Pain Assessment Pain Scale: 0-10 Pain Score: 0-No pain   Other Treatments:     Therapy/Group: Individual Therapy  Carlos Levering 03/14/2019, 3:40 PM

## 2019-03-14 NOTE — Progress Notes (Signed)
Occupational Therapy Session Note  Patient Details  Name: Dennis Fischer MRN: [id number] Date of Birth: September 18, 1945  Today's Date: 03/14/2019 OT Individual Time: 9791-5041 OT Individual Time Calculation (min): 30 min    Short Term Goals: Week 1:  OT Short Term Goal 1 (Week 1): Pt will transfer to toilet/BSC without use of mechanical lift OT Short Term Goal 2 (Week 1): Pt will don pants sit>stand with steadying assist using LRAD and AE PRN OT Short Term Goal 3 (Week 1): Pt will complete toileting task with mod A using LRAD in order to reduce caregiver burden OT Short Term Goal 4 (Week 1): Pt will bathe seated EOB/ w/c at sink with mod A using LRAD and AE PRN  Skilled Therapeutic Interventions/Progress Updates:    Patient in bed, ready for therapy session.  Donned pants in supine position with min a.  Supine to SSP with min A.  Sit to stand and SPT with RW min A to w/c.  Completed grooming and UB bathing/dressing at w/c level with set up.  Standing balance activities with RW min/mod a.  He remained sitting in the w/c at close of session with seat belt alarm set and call bell in reach.    Therapy Documentation Precautions:  Precautions Precautions: Fall Restrictions Weight Bearing Restrictions: No General:   Vital Signs: Therapy Vitals Pulse Rate: 76 BP: (!) 147/84 Pain: Pain Assessment Pain Scale: 0-10 Pain Score: 0-No pain   Other Treatments:     Therapy/Group: Individual Therapy  Carlos Levering 03/14/2019, 12:35 PM

## 2019-03-14 NOTE — Progress Notes (Signed)
Occupational Therapy Session Note  Patient Details  Name: Dennis Fischer MRN: [id number] Date of Birth: [birth year]  Today's Date: 03/14/2019 OT Individual Time: 1300-1330 OT Individual Time Calculation (min): 30 min    Short Term Goals: Week 1:  OT Short Term Goal 1 (Week 1): Pt will transfer to toilet/BSC without use of mechanical lift OT Short Term Goal 2 (Week 1): Pt will don pants sit>stand with steadying assist using LRAD and AE PRN OT Short Term Goal 3 (Week 1): Pt will complete toileting task with mod A using LRAD in order to reduce caregiver burden OT Short Term Goal 4 (Week 1): Pt will bathe seated EOB/ w/c at sink with mod A using LRAD and AE PRN  Skilled Therapeutic Interventions/Progress Updates:    Pt resting in bed upon arrival and agreeable to therapy.  OT intervention with focus on bed mobility, functional transfers, w/c mobility, and BUE therex to increase independence with BADLs. Supine>sit EOB with supervision Scoot transfer bed>w/c with CGA Pt propelled w/c to gym and engaged in BUE therex on [employer] (8 min Random Level 4) Pt returned to room and transferred back to bed with scoot transfer CGA. Pt performed lateral scoots towards HOB and performed sit>supine with supervision. Pt remained in bed with all needs within reach and bed alarm activated.   Therapy Documentation Precautions:  Precautions Precautions: Fall Restrictions Weight Bearing Restrictions: No  Pain: Pain Assessment Pain Scale: 0-10 Pain Score: 0-No pain   Therapy/Group: Individual Therapy  Leroy Libman 03/14/2019, 1:35 PM

## 2019-03-15 DIAGNOSIS — R7401 Elevation of levels of liver transaminase levels: Secondary | ICD-10-CM

## 2019-03-15 DIAGNOSIS — R740 Nonspecific elevation of levels of transaminase and lactic acid dehydrogenase [LDH]: Secondary | ICD-10-CM

## 2019-03-15 LAB — GLUCOSE, CAPILLARY
Glucose-Capillary: 140 mg/dL — ABNORMAL HIGH (ref 70–99)
Glucose-Capillary: 137 mg/dL — ABNORMAL HIGH (ref 70–99)
Glucose-Capillary: 146 mg/dL — ABNORMAL HIGH (ref 70–99)
Glucose-Capillary: 166 mg/dL — ABNORMAL HIGH (ref 70–99)

## 2019-03-15 NOTE — Progress Notes (Signed)
[facility] PHYSICAL MEDICINE & REHABILITATION PROGRESS NOTE   Subjective/Complaints: Patient seen laying in bed this morning.  Per nursing, patient with confusion overnight.  Patient states he slept very well.  He states he feels well this morning.  ROS: Denies CP, SOB, N/V/D   Objective:   No results found. Recent Labs    03/13/19 1343  WBC 6.9  HGB 8.6*  HCT 25.5*  PLT 182   No results for input(s): NA, K, CL, CO2, GLUCOSE, BUN, CREATININE, CALCIUM in the last 72 hours.  Intake/Output Summary (Last 24 hours) at 03/15/2019 1148 Last data filed at 03/15/2019 0849 Gross per 24 hour  Intake 480 ml  Output 2100 ml  Net -1620 ml     Physical Exam: Vital Signs Blood pressure 138/79, pulse 64, temperature 98.2 F (36.8 C), temperature source Oral, resp. rate 16, height 6' (1.829 m), weight 85.7 kg, SpO2 99 %. Constitutional: No distress . Vital signs reviewed. HENT: Normocephalic.  Atraumatic. Eyes: EOMI.  No discharge. Cardiovascular: No JVD. Respiratory: Normal effort. GI: Non-distended. Musc: No edema or tenderness in extremities. Neurological: He is alert.  Motor: Bilateral upper extremities: 5/5 proximal distal, stable Bilateral lower extremities: Hip flexion, knee extension 4-/5, ankle dorsiflexion 4+/5, stable Skin: Skin is warm and dry.  Psychiatric: pleasant   Assessment/Plan: 1. 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  Physiatrist is providing close team supervision and 24 hour management of active medical problems listed below.  Physiatrist and rehab team continue to assess barriers to discharge/monitor patient progress toward functional and medical goals  Care Tool:  Bathing    Body parts bathed by patient: Right arm, Left arm, Chest, Abdomen, Front perineal area, Right upper leg, Left upper leg, Face   Body parts  bathed by helper: Buttocks, Right lower leg, Left lower leg     Bathing assist Assist Level: Moderate Assistance - Patient 50 - 74%     Upper Body Dressing/Undressing Upper body dressing   What is the patient wearing?: Pull over shirt    Upper body assist Assist Level: Set up assist    Lower Body Dressing/Undressing Lower body dressing      What is the patient wearing?: Pants     Lower body assist Assist for lower body dressing: Minimal Assistance - Patient > 75%     Toileting Toileting    Toileting assist Assist for toileting: Moderate Assistance - Patient 50 - 74%     Transfers Chair/bed transfer  Transfers assist     Chair/bed transfer assist level: Moderate Assistance - Patient 50 - 74%     Locomotion Ambulation   Ambulation assist   Ambulation activity did not occur: Safety/medical concerns  Assist level: Moderate Assistance - Patient 50 - 74% Assistive device: Walker-rolling Max distance: 2'   Walk 10 feet activity   Assist  Walk 10 feet activity did not occur: Safety/medical concerns        Walk 50 feet activity   Assist Walk 50 feet with 2 turns activity did not occur: Safety/medical concerns         Walk 150 feet activity   Assist Walk 150 feet activity did not occur: Safety/medical concerns         Walk 10 feet on uneven surface  activity   Assist Walk 10 feet on uneven surfaces activity did not occur: Safety/medical concerns  Wheelchair     Assist Will patient use wheelchair at discharge?: Yes Type of Wheelchair: Manual    Wheelchair assist level: Supervision/Verbal cueing Max wheelchair distance: 150'    Wheelchair 50 feet with 2 turns activity    Assist        Assist Level: Supervision/Verbal cueing   Wheelchair 150 feet activity     Assist     Assist Level: Supervision/Verbal cueing    Medical Problem List and Plan: 1.  Deficits with mobility, endurance, self-care secondary  to thoracic myelopathy/metastatic prostate cancer status post decompression .  Continue CIR  -decadron 6 mg every 6 hours on 7/4, continue taper 2.  Antithrombotics: -DVT/anticoagulation:  Mechanical: Sequential compression devices, below knee Bilateral lower extremities             -antiplatelet therapy: N/A 3. Pain Management: Hydrocodone prn effective.  4. Mood: LCSW to follow for evaluation and support.              -antipsychotic agents: N/A 5. Neuropsych: This patient is capable of making decisions on his own behalf. 6. Skin/Wound Care: Monitor  Incision daily for healing.  7. Fluids/Electrolytes/Nutrition: encourage po 8. HTN: Monitor BP tid--continue Coreg and Norvasc.  Patient Vitals for the past 24 hrs:  BP Temp Temp src Pulse Resp SpO2 Weight  03/15/19 0747 138/79 - - 64 - - -  03/15/19 0610 140/80 98.2 F (36.8 C) Oral (!) 55 - 99 % -  03/14/19 2036 120/75 98.5 F (36.9 C) Oral 71 16 100 % -  03/14/19 1902 - - - - - - 85.7 kg  03/14/19 1405 120/76 98.6 F (37 C) Oral 64 16 99 % -   Relatively controlled on 7/5 9. Neuropathy: Reports improvement in sensation BLE---almost normal in fact.  - Continue Neurontin at bedtime.   Monitor pain with increased mobility. 10. ?neurogenic bowel/bladder: Improving 11.  Steroid-induced hyperglycemia:  Will not make any changes while weaning steroids  -monitor CBG's BID.    -cover with SSI CBG (last 3)  Recent Labs    03/14/19 2123 03/15/19 0634 03/15/19 1115  GLUCAP 179* 140* 137*    Elevated on 7/5 12. ABLA:  Hemoglobin 8.6 on 7/3  Continue to monitor  -no clinical signs of blood loss 13.  Hypoalbuminemia  Protein supplementation 14.  Transaminitis  LFTs elevated on 7/2  Continue to monitor  LOS: 4 days A FACE TO FACE EVALUATION WAS PERFORMED  Dennis Fischer Lorie Phenix 03/15/2019, 11:48 AM
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
## 2019-03-16 ENCOUNTER — Inpatient Hospital Stay (HOSPITAL_COMMUNITY): Payer: Medicare Other | Admitting: Physical Therapy

## 2019-03-16 ENCOUNTER — Inpatient Hospital Stay (HOSPITAL_COMMUNITY): Payer: Medicare Other

## 2019-03-16 ENCOUNTER — Inpatient Hospital Stay (HOSPITAL_COMMUNITY): Payer: Medicare Other | Admitting: Occupational Therapy

## 2019-03-16 DIAGNOSIS — G8220 Paraplegia, unspecified: Secondary | ICD-10-CM

## 2019-03-16 LAB — GLUCOSE, CAPILLARY
Glucose-Capillary: 137 mg/dL — ABNORMAL HIGH (ref 70–99)
Glucose-Capillary: 115 mg/dL — ABNORMAL HIGH (ref 70–99)
Glucose-Capillary: 121 mg/dL — ABNORMAL HIGH (ref 70–99)
Glucose-Capillary: 147 mg/dL — ABNORMAL HIGH (ref 70–99)

## 2019-03-16 MED ORDER — DEXAMETHASONE 4 MG PO TABS
4.0000 mg | ORAL_TABLET | Freq: Four times a day (QID) | ORAL | Status: DC
  Administered 2019-03-16 – 2019-03-18 (×6): 4 mg via ORAL
  Filled 2019-03-16 (×6): qty 1

## 2019-03-16 NOTE — Care Management (Signed)
Inpatient [facility] Individual Statement of Services  Patient Name:  Dennis Fischer  Date:  03/16/2019  Welcome to the [facility].  Our goal is to provide you with an individualized program based on your diagnosis and situation, designed to meet your specific needs.  With this comprehensive rehabilitation program, you will be expected to participate in at least 3 hours of rehabilitation therapies Monday-Friday, with modified therapy programming on the weekends.  Your rehabilitation program will include the following services:  Physical Therapy (PT), Occupational Therapy (OT), 24 hour per day rehabilitation nursing, Therapeutic Recreaction (TR), Case Management (Social Worker), Rehabilitation Medicine, Nutrition Services and Pharmacy Services  Weekly team conferences will be held on Tuesdays to discuss your progress.  Your Social Worker will talk with you frequently to get your input and to update you on team discussions.  Team conferences with you and your family in attendance may also be held.  Expected length of stay: 14-17 days   Overall anticipated outcome: minimal assist  Depending on your progress and recovery, your program may change. Your Social Worker will coordinate services and will keep you informed of any changes. Your Social Worker's name and contact numbers are listed  below.  The following services may also be recommended but are not provided by the [facility] will be made to provide these services after discharge if needed.  Arrangements include referral to agencies that provide these services.  Your insurance has been verified to be:  Medicare; [place] Your primary doctor is:  Zettie Cooley  Pertinent information will be shared with your doctor and your insurance company.  Social Worker:  [place], [place] or ([phone]   Information discussed with and copy given to patient by: Lennart Pall, 03/16/2019, 2:40 PM

## 2019-03-16 NOTE — Progress Notes (Signed)
Physical Therapy Session Note  Patient Details  Name: Dennis Fischer MRN: [id number] Date of Birth: [birth year]  Today's Date: 03/16/2019 PT Individual Time: 0800-0900 PT Individual Time Calculation (min): 60 min   Short Term Goals: Week 1:  PT Short Term Goal 1 (Week 1): Pt will complete least restrictive transfer with mod A PT Short Term Goal 2 (Week 1): Pt will initiate gait training as safe and able PT Short Term Goal 3 (Week 1): Pt will perform bed mobility with Supervision  Skilled Therapeutic Interventions/Progress Updates:    Pt received supine in bed, agreeable to PT session. No complaints of pain. Supine to sit with Supervision with HOB elevated and use of bedrail. Pt reports urge to use the bathroom. Sit to stand with CGA from elevated surface to stedy. Stedy transfer bed to elevated BSC over toilet. Pt is mod A to stand from toilet, setup A for pericare, max A to pull up pants and brief in standing position. Pt is setup A for brushing teeth and UB bathing/dressing while seated at sink in w/c. Pt is dependent for donning shoes and socks for time conservation. Manual w/c propulsion x 150 ft with use of BUE and Supervision, v/c for management of w/c leg rests. Discussed pt's home setup, w/c fits everywhere except into 2nd bedroom and bathroom. Per pt he was having neighbor's sons assist him with bumping up curbs and steps in his w/c. Pt to have his girlfriend take pictures of stair setup for therapist. Stand pivot transfer w/c to/from mat table with mod A and RW, uncontrolled descent when sitting. Pt also demonstrates ataxia in BLE increasing difficulty with managing BLE with transfer. Pt requests to return to bed at end of session. Squat pivot transfer back to bed with min A. Sit to supine Supervision. Pt left supine in bed with needs in reach, bed alarm in place at end of session.  Therapy Documentation Precautions:  Precautions Precautions: Fall Restrictions Weight Bearing  Restrictions: No    Therapy/Group: Individual Therapy   Excell Seltzer, PT, DPT  03/16/2019, 9:58 AM

## 2019-03-16 NOTE — Progress Notes (Signed)
[facility] PHYSICAL MEDICINE & REHABILITATION PROGRESS NOTE   Subjective/Complaints:  No issues overnite, feels like he iws moving RLE better than left  ROS: Denies CP, SOB, N/V/D   Objective:   No results found. Recent Labs    03/13/19 1343  WBC 6.9  HGB 8.6*  HCT 25.5*  PLT 182   No results for input(s): NA, K, CL, CO2, GLUCOSE, BUN, CREATININE, CALCIUM in the last 72 hours.  Intake/Output Summary (Last 24 hours) at 03/16/2019 0657 Last data filed at 03/16/2019 0550 Gross per 24 hour  Intake 780 ml  Output 1600 ml  Net -820 ml     Physical Exam: Vital Signs Blood pressure (!) 145/85, pulse 62, temperature 98.4 F (36.9 C), temperature source Oral, resp. rate 14, height 6' (1.829 m), weight 86 kg, SpO2 98 %. Constitutional: No distress . Vital signs reviewed. HENT: Normocephalic.  Atraumatic. Eyes: EOMI.  No discharge. Cardiovascular: No JVD. Respiratory: Normal effort. GI: Non-distended. Musc: No edema or tenderness in extremities. Neurological: He is alert.  Motor: Bilateral upper extremities: 5/5 proximal distal, stable  lower extremities: Right Hip flexion, knee extension 4/5, ankle dorsiflexion 4/5, stable Left LE 4- + dysmetria , LLE Reduced sensationm to LT and proprio on Left  Skin: Skin is warm and dry.  Psychiatric: pleasant   Assessment/Plan: 1. 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  Physiatrist is providing close team supervision and 24 hour management of active medical problems listed below.  Physiatrist and rehab team continue to assess barriers to discharge/monitor patient progress toward functional and medical goals  Care Tool:  Bathing    Body parts bathed by patient: Right arm, Left arm, Chest, Abdomen, Front perineal area, Right upper leg, Left upper leg, Face   Body parts bathed by helper:  Buttocks, Right lower leg, Left lower leg     Bathing assist Assist Level: Moderate Assistance - Patient 50 - 74%     Upper Body Dressing/Undressing Upper body dressing   What is the patient wearing?: Pull over shirt    Upper body assist Assist Level: Set up assist    Lower Body Dressing/Undressing Lower body dressing      What is the patient wearing?: Pants     Lower body assist Assist for lower body dressing: Minimal Assistance - Patient > 75%     Toileting Toileting    Toileting assist Assist for toileting: Moderate Assistance - Patient 50 - 74%     Transfers Chair/bed transfer  Transfers assist     Chair/bed transfer assist level: Moderate Assistance - Patient 50 - 74%     Locomotion Ambulation   Ambulation assist   Ambulation activity did not occur: Safety/medical concerns  Assist level: Moderate Assistance - Patient 50 - 74% Assistive device: Walker-rolling Max distance: 2'   Walk 10 feet activity   Assist  Walk 10 feet activity did not occur: Safety/medical concerns        Walk 50 feet activity   Assist Walk 50 feet with 2 turns activity did not occur: Safety/medical concerns         Walk 150 feet activity   Assist Walk 150 feet activity did not occur: Safety/medical concerns         Walk 10 feet on uneven surface  activity   Assist Walk 10 feet on uneven surfaces activity did not occur: Safety/medical concerns  Wheelchair     Assist Will patient use wheelchair at discharge?: Yes Type of Wheelchair: Manual    Wheelchair assist level: Supervision/Verbal cueing Max wheelchair distance: 150'    Wheelchair 50 feet with 2 turns activity    Assist        Assist Level: Supervision/Verbal cueing   Wheelchair 150 feet activity     Assist     Assist Level: Supervision/Verbal cueing    Medical Problem List and Plan: 1.  Deficits with mobility, endurance, self-care secondary to thoracic  myelopathy/metastatic prostate cancer status post decompression .  Continue CIR  -decadron 6 mg every 6 hours on 7/4, continue taper to 4mg  tomorrow 2.  Antithrombotics: -DVT/anticoagulation:  Mechanical: Sequential compression devices, below knee Bilateral lower extremities             -antiplatelet therapy: N/A 3. Pain Management: Hydrocodone prn effective.  4. Mood: LCSW to follow for evaluation and support.              -antipsychotic agents: N/A 5. Neuropsych: This patient is capable of making decisions on his own behalf. 6. Skin/Wound Care: Monitor  Incision daily for healing.  7. Fluids/Electrolytes/Nutrition: encourage po 8. HTN: Monitor BP tid--continue Coreg and Norvasc.  Patient Vitals for the past 24 hrs:  BP Temp Temp src Pulse Resp SpO2 Weight  03/16/19 0551 (!) 145/85 98.4 F (36.9 C) Oral 62 14 98 % 86 kg  03/15/19 1949 129/79 98.3 F (36.8 C) Oral 70 16 98 % -  03/15/19 1355 123/77 98.7 F (37.1 C) - 67 16 100 % -  03/15/19 0747 138/79 - - 64 - - -   Relatively controlled on 7/6 9. Neuropathy: Reports improvement in sensation BLE---almost normal in fact.  - Continue Neurontin at bedtime.   Monitor pain with increased mobility. 10. ?neurogenic bowel/bladder: Improving 11.  Steroid-induced hyperglycemia:  Will not make any changes while weaning steroids  -monitor CBG's BID.    -cover with SSI CBG (last 3)  Recent Labs    03/15/19 1618 03/15/19 2115 03/16/19 0637  GLUCAP 146* 166* 137*    Elevated on 7/6- expect improvement with steroid wean 12. ABLA:  Hemoglobin 8.6 on 7/3  Continue to monitor  -no clinical signs of blood loss 13.  Hypoalbuminemia  Protein supplementation 14.  Transaminitis- may be related to Zocor  LFTs elevated on 7/2  Continue to monitor  LOS: 5 days A FACE TO [place] E Zeddie Njie 03/16/2019, 6:57 AM

## 2019-03-16 NOTE — Plan of Care (Signed)
  Problem: Consults Goal: Diabetes Guidelines if Diabetic/Glucose > 140 Description: If diabetic or lab glucose is > 140 mg/dl - Initiate Diabetes/Hyperglycemia Guidelines & Document Interventions  Outcome: Progressing   Problem: SCI BLADDER ELIMINATION Goal: RH STG MANAGE BLADDER WITH ASSISTANCE Description: STG Manage Bladder With  Min Assistance Outcome: Progressing   Problem: RH SKIN INTEGRITY Goal: RH STG MAINTAIN SKIN INTEGRITY WITH ASSISTANCE Description: STG Maintain Skin Integrity With Min Assistance. Outcome: Progressing   Problem: RH SAFETY Goal: RH STG ADHERE TO SAFETY PRECAUTIONS W/ASSISTANCE/DEVICE Description: STG Adhere to Safety Precautions With  Min Assistance/Device. Outcome: Progressing   Problem: RH PAIN MANAGEMENT Goal: RH STG PAIN MANAGED AT OR BELOW PT'S PAIN GOAL Description: At or below level 4 Outcome: Progressing   Problem: RH KNOWLEDGE DEFICIT SCI Goal: RH STG INCREASE KNOWLEDGE OF SELF CARE AFTER SCI Description: Pt will be able to direct care at discharge independently using handouts and education Outcome: Progressing

## 2019-03-16 NOTE — Progress Notes (Signed)
Physical Therapy Session Note  Patient Details  Name: Dennis Fischer MRN: [id number] Date of Birth: 1946-06-19  Today's Date: 03/16/2019 PT Individual Time: 6812-7517 PT Individual Time Calculation (min): 41 min   Short Term Goals: Week 1:  PT Short Term Goal 1 (Week 1): Pt will complete least restrictive transfer with mod A PT Short Term Goal 2 (Week 1): Pt will initiate gait training as safe and able PT Short Term Goal 3 (Week 1): Pt will perform bed mobility with Supervision  Skilled Therapeutic Interventions/Progress Updates:    pt performs bed mobility with supervision, squat pivot transfers throughout session with mod A, manual facilitation for wt shifts, cues for UE and LE placement.  Standing balance with RW for mini squats with min/mod A due to decreased strength and coordination.  Gait 2 x 15' initially with min A, mod/max A when fatigued due to increased ataxia, decreased coordination and decreased eccentric control when sitting.  Attempt to perform quadruped, pt unable to get into position.  Performed prone HS curls with significantly decreased HS strength bilat.  Supine bridging for glute and HS strengthening with pt improving with tactile cues, performs 3 x 10. Pt left in bed with alarm set, needs at hand.  Therapy Documentation Precautions:  Precautions Precautions: Fall Restrictions Weight Bearing Restrictions: No Pain:  no c/o pain   Therapy/Group: Co-Treatment  Dennis Fischer 03/16/2019, 11:27 AM

## 2019-03-16 NOTE — Progress Notes (Signed)
Occupational Therapy Session Note  Patient Details  Name: [name] MRN: [id number] Date of Birth: 05-26-1946  Today's Date: 03/16/2019 OT Individual Time: 1230-1340 OT Individual Time Calculation (min): 70 min    Short Term Goals: Week 1:  OT Short Term Goal 1 (Week 1): Pt will transfer to toilet/BSC without use of mechanical lift OT Short Term Goal 2 (Week 1): Pt will don pants sit>stand with steadying assist using LRAD and AE PRN OT Short Term Goal 3 (Week 1): Pt will complete toileting task with mod A using LRAD in order to reduce caregiver burden OT Short Term Goal 4 (Week 1): Pt will bathe seated EOB/ w/c at sink with mod A using LRAD and AE PRN  Skilled Therapeutic Interventions/Progress Updates:    Pt seen for OT session focusing on functional standing balanace/endurance and LE neuro re-ed. Pt in supine upon arrival eating lunch, agreeable to tx session and denying pain.  He transferred to sitting EOB with min A using hospital bed functions. Completed CGA squat pivot transfers throughout session with VCs for technique and assist to stabilize equipment.  He self propelled w/c throughout unit with supervision and intermittent short rest breaks.  Completed multiple sit>stands from EOM to RW, mod A overall with max A required for controlled descent. Mirror placed for visual feedback of posture, pt noted to have B knees hyperextended with inability to control into neutral position.  Completed dynamic standing activity removing clothes pins from around waist band in simulation of LB dressing task. Completed with min A initially, however, required mod A when fatigued with decreased awareness to LOB episodes to L and posteriorly.  Pt discussed return to driving, provided education and examples of LE proprioceptual deficits and functional implications, specifically as it relates to driving. Pt voiced understanding and acknowledgement that driving at this time would be unsafe. Seated EOM,  completed toe tap exercises to specific targets addressing controlled movements in B LEs.  Pt returned to w/c and self propelled w/c to ADL bathroom. Completed squat pivot transfer w/c<> standard toilet with use of grab bar (bathroom in pt's room not accessible for this transfer method). Completed with min A and VCs for technique. Pt returned to room at end of session, left sitting up in w/c with all needs in reach and chair belt alarm on.    Therapy Documentation Precautions:  Precautions Precautions: Fall Restrictions Weight Bearing Restrictions: No   Therapy/Group: Individual Therapy  Lyndsee Casa L 03/16/2019, 7:15 AM

## 2019-03-16 NOTE — Progress Notes (Signed)
Physical Therapy Session Note  Patient Details  Name: Dennis Fischer MRN: [id number] Date of Birth: [birth year]  Today's Date: 03/16/2019 PT Individual Time: 1120-1150 PT Individual Time Calculation (min): 30 min   Short Term Goals: Week 1:  PT Short Term Goal 1 (Week 1): Pt will complete least restrictive transfer with mod A PT Short Term Goal 2 (Week 1): Pt will initiate gait training as safe and able PT Short Term Goal 3 (Week 1): Pt will perform bed mobility with Supervision  Skilled Therapeutic Interventions/Progress Updates:     Patient in bed upon PT arrival. Patient alert and agreeable to PT session.  Therapeutic Activity: Bed Mobility: Patient performed supine to/from sit with supervision. Provided verbal cues for log roll technique. Transfers: Patient performed quat pivot transfers x2 with mod-min A. Provided verbal cues for removing arm rest prior to transfer, foot placement, and head-hips realationship.  Wheelchair Mobility:  Patient propelled wheelchair 100 feet x2 for UE strengthening and endurance with supervision. Provided verbal cues for increased speed.  Therapeutic Exercise: Patient performed the following exercises with verbal and tactile cues for proper technique. Performed 6 min on the UE ergometer at level 3 x2 min, level 4 x2 min, and back to level 3 for the last 2 min for interval training. HR 67 prior to exercise, HR 109 at end of exercise, took 2.5 min to recover back to 71. RPE 8/10 after.   Patient in bed at end of session with breaks locked, bed alarm set, and all needs within reach.    Therapy Documentation Precautions:  Precautions Precautions: Fall Restrictions Weight Bearing Restrictions: No Pain: Patient denied pain throughout session.    Therapy/Group: Individual Therapy  Tarris Delbene L Lisanne Ponce PT, DPT  03/16/2019, 4:05 PM

## 2019-03-16 NOTE — Progress Notes (Signed)
Rounded to room for line care. Pt up with walker. VAS Team tol return at a later time.

## 2019-03-17 ENCOUNTER — Inpatient Hospital Stay (HOSPITAL_COMMUNITY): Payer: Medicare Other | Admitting: Physical Therapy

## 2019-03-17 ENCOUNTER — Inpatient Hospital Stay (HOSPITAL_COMMUNITY): Payer: Medicare Other | Admitting: Occupational Therapy

## 2019-03-17 LAB — GLUCOSE, CAPILLARY
Glucose-Capillary: 107 mg/dL — ABNORMAL HIGH (ref 70–99)
Glucose-Capillary: 120 mg/dL — ABNORMAL HIGH (ref 70–99)
Glucose-Capillary: 163 mg/dL — ABNORMAL HIGH (ref 70–99)

## 2019-03-17 NOTE — Progress Notes (Signed)
Occupational Therapy Session Note  Patient Details  Name: Dennis Fischer MRN: [id number] Date of Birth: 1946/07/15  Today's Date: 03/17/2019 OT Individual Time: 1103-1594 OT Individual Time Calculation (min): 45 min    Short Term Goals: Week 1:  OT Short Term Goal 1 (Week 1): Pt will transfer to toilet/BSC without use of mechanical lift OT Short Term Goal 2 (Week 1): Pt will don pants sit>stand with steadying assist using LRAD and AE PRN OT Short Term Goal 3 (Week 1): Pt will complete toileting task with mod A using LRAD in order to reduce caregiver burden OT Short Term Goal 4 (Week 1): Pt will bathe seated EOB/ w/c at sink with mod A using LRAD and AE PRN  Skilled Therapeutic Interventions/Progress Updates:    Pt seen for OT session focusing on functional transfers and ADL re-training. Pt awake in supine upon arrival, denying pain and agreeable to tx session. He requested toileting task this morning. He transferred to sitting EOB with supervision using hospital bed functions. Shoes donned seated EOB, noted improved control movements of B LEs, L>R needed to place foot into shoes, required mod A for donning L shoe, R shoe donned with supervision using LH shoe horn. He completed min A squat pivot transfer to w/c, unable to recall hand placement/technique for transfer requiring VCs to recall.  Stand-step pivot transfer to [facility] over toilet with mod-max A, increased time and VCs for sequencing/technique. Increased assist required for controlled descent onto toilet.  HE stood from toilet with mod-max A, once balance established he was able to alternate UE support on RW in order to pull pants up with mod A for dynamic standing balance. Completed stand pivot transfer back to w/c in same manner as described above. Grooming tasks and UB bathing/dressing completed from w/c level at sink with set-up.   Pt left seated in w/c at end of session with all needs in reach and chair belt alarm on.   Therapy  Documentation Precautions:  Precautions Precautions: Fall Restrictions Weight Bearing Restrictions: No Pain:   No/denies pain   Therapy/Group: Individual Therapy  Jaleeya Mcnelly L 03/17/2019, 6:54 AM

## 2019-03-17 NOTE — Progress Notes (Signed)
[place] PHYSICAL MEDICINE & REHABILITATION PROGRESS NOTE   Subjective/Complaints:  No problems overnite, pt slept well, tolerating therapy.  Voids into urinal, bowels ok per pt  ROS: Denies CP, SOB, N/V/D   Objective:   No results found. No results for input(s): WBC, HGB, HCT, PLT in the last 72 hours. No results for input(s): NA, K, CL, CO2, GLUCOSE, BUN, CREATININE, CALCIUM in the last 72 hours.  Intake/Output Summary (Last 24 hours) at 03/17/2019 0709 Last data filed at 03/17/2019 0529 Gross per 24 hour  Intake 600 ml  Output 1620 ml  Net -1020 ml     Physical Exam: Vital Signs Blood pressure 125/78, pulse 60, temperature 98.1 F (36.7 C), resp. rate 14, height 6' (1.829 m), weight 86 kg, SpO2 100 %. Constitutional: No distress . Vital signs reviewed. HENT: Normocephalic.  Atraumatic. Eyes: EOMI.  No discharge. Cardiovascular: No JVD. Respiratory: Normal effort. GI: Non-distended. Musc: No edema or tenderness in extremities. Neurological: He is alert.  Motor: Bilateral upper extremities: 5/5 proximal distal, stable  lower extremities: Right Hip flexion, knee extension 4/5, ankle dorsiflexion 4/5, stable Left LE 4- + dysmetria , LLE Reduced sensationm to LT and proprio on Left  Skin: Skin is warm and dry.  Psychiatric: pleasant   Assessment/Plan: 1. 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  Physiatrist is providing close team supervision and 24 hour management of active medical problems listed below.  Physiatrist and rehab team continue to assess barriers to discharge/monitor patient progress toward functional and medical goals  Care Tool:  Bathing    Body parts bathed by patient: Right arm, Left arm, Chest, Abdomen, Face   Body parts bathed by helper: Buttocks, Right lower leg, Left lower leg     Bathing assist  Assist Level: Set up assist     Upper Body Dressing/Undressing Upper body dressing   What is the patient wearing?: Pull over shirt    Upper body assist Assist Level: Set up assist    Lower Body Dressing/Undressing Lower body dressing      What is the patient wearing?: Underwear/pull up, Pants     Lower body assist Assist for lower body dressing: Maximal Assistance - Patient 25 - 49%     Toileting Toileting    Toileting assist Assist for toileting: Moderate Assistance - Patient 50 - 74%     Transfers Chair/bed transfer  Transfers assist     Chair/bed transfer assist level: Moderate Assistance - Patient 50 - 74%     Locomotion Ambulation   Ambulation assist   Ambulation activity did not occur: Safety/medical concerns  Assist level: Moderate Assistance - Patient 50 - 74% Assistive device: Walker-rolling Max distance: 2'   Walk 10 feet activity   Assist  Walk 10 feet activity did not occur: Safety/medical concerns        Walk 50 feet activity   Assist Walk 50 feet with 2 turns activity did not occur: Safety/medical concerns         Walk 150 feet activity   Assist Walk 150 feet activity did not occur: Safety/medical concerns         Walk 10 feet on uneven surface  activity   Assist Walk 10 feet on uneven surfaces activity did not occur: Safety/medical concerns         Wheelchair     Assist Will patient use wheelchair at discharge?: Yes  Type of Wheelchair: Manual    Wheelchair assist level: Supervision/Verbal cueing Max wheelchair distance: 100'    Wheelchair 50 feet with 2 turns activity    Assist        Assist Level: Supervision/Verbal cueing   Wheelchair 150 feet activity     Assist     Assist Level: Supervision/Verbal cueing    Medical Problem List and Plan: 1.  Deficits with mobility, endurance, self-care secondary to thoracic myelopathy/metastatic prostate cancer status post decompression  .  Continue CIR Team conference today please see physician documentation under team conference tab, met with team face-to-face to discuss problems,progress, and goals. Formulized individual treatment plan based on medical history, underlying problem and comorbidities.  -decadron 6 mg every 6 hours on 7/4, continue taper to 69m today  2.  Antithrombotics: -DVT/anticoagulation:  Mechanical: Sequential compression devices, below knee Bilateral lower extremities             -antiplatelet therapy: N/A 3. Pain Management: Hydrocodone prn effective.  4. Mood: LCSW to follow for evaluation and support.              -antipsychotic agents: N/A 5. Neuropsych: This patient is capable of making decisions on his own behalf. 6. Skin/Wound Care: Monitor  Incision daily for healing.  7. Fluids/Electrolytes/Nutrition: encourage po 8. HTN: Monitor BP tid--continue Coreg and Norvasc.  Patient Vitals for the past 24 hrs:  BP Temp Temp src Pulse Resp SpO2  03/17/19 0506 125/78 98.1 F (36.7 C) - 60 14 100 %  03/16/19 2027 117/73 97.9 F (36.6 C) - 69 18 98 %  03/16/19 1515 136/82 98 F (36.7 C) Oral 67 16 99 %   Relatively controlled on 7/7 9. Neuropathy: Reports improvement in sensation BLE---almost normal in fact.  - Continue Neurontin at bedtime.   Monitor pain with increased mobility. 10. ?neurogenic bowel/bladder: Improving 11.  Steroid-induced hyperglycemia:  Will not make any changes while weaning steroids  -monitor CBG's BID.    -cover with SSI CBG (last 3)  Recent Labs    03/16/19 1154 03/16/19 1637 03/16/19 2140  GLUCAP 115* 121* 147*    Improving control  7/7- expect improvement with steroid wean 12. ABLA:  Hemoglobin 8.6 on 7/3  Continue to monitor  -no clinical signs of blood loss 13.  Hypoalbuminemia  Protein supplementation 14.  Transaminitis- may be related to Zocor  LFTs elevated on 7/2  Continue to monitor  LOS: 6 days A FACE TO FACE EVALUATION WAS PERFORMED  ACharlett Blake7/03/2019, 7:09 AM

## 2019-03-17 NOTE — Plan of Care (Signed)
  Problem: Consults Goal: Skin Care Protocol Initiated - if Braden Score 18 or less Description: If consults are not indicated, leave blank or document N/A Outcome: Progressing Goal: Diabetes Guidelines if Diabetic/Glucose > 140 Description: If diabetic or lab glucose is > 140 mg/dl - Initiate Diabetes/Hyperglycemia Guidelines & Document Interventions  Outcome: Progressing   Problem: SCI BOWEL ELIMINATION Goal: RH STG MANAGE BOWEL WITH ASSISTANCE Description: STG Manage Bowel with Min Assistance. Outcome: Progressing   Problem: SCI BLADDER ELIMINATION Goal: RH STG MANAGE BLADDER WITH ASSISTANCE Description: STG Manage Bladder With  Min Assistance Outcome: Progressing   Problem: RH SKIN INTEGRITY Goal: RH STG MAINTAIN SKIN INTEGRITY WITH ASSISTANCE Description: STG Maintain Skin Integrity With [place]. Outcome: Progressing   Problem: RH SAFETY Goal: RH STG ADHERE TO SAFETY PRECAUTIONS W/ASSISTANCE/DEVICE Description: STG Adhere to Safety Precautions With  Min Assistance/Device. Outcome: Progressing   Problem: RH PAIN MANAGEMENT Goal: RH STG PAIN MANAGED AT OR BELOW PT'S PAIN GOAL Description: At or below level 4 Outcome: Progressing   Problem: RH KNOWLEDGE DEFICIT SCI Goal: RH STG INCREASE KNOWLEDGE OF SELF CARE AFTER SCI Description: Pt will be able to direct care at discharge independently using handouts and education Outcome: Progressing

## 2019-03-17 NOTE — Progress Notes (Signed)
Physical Therapy Session Note  Patient Details  Name: Dennis Fischer MRN: [id number] Date of Birth: 1946-02-23  Today's Date: 03/17/2019 PT Individual Time: 0850-1000 PT Individual Time Calculation (min): 70 min   Short Term Goals: Week 1:  PT Short Term Goal 1 (Week 1): Pt will complete least restrictive transfer with mod A PT Short Term Goal 2 (Week 1): Pt will initiate gait training as safe and able PT Short Term Goal 3 (Week 1): Pt will perform bed mobility with Supervision  Skilled Therapeutic Interventions/Progress Updates:    pt performs squat pivot transfers with min A throughout session, improving his coordination with this task.  Sit <> stand blocked practice with use of chair in front with UEs on armrests of chair to promote forward wt shift and decrease posterior lean. Pt able to perform with improving anterior wt shifts with min/mod facilitation. Standing balance with UEs on back of chair with alternating UE lifts with min A, cues to decrease knee hyperextension and maintain upright posture. Pt mod/max A with this task. Pt performs side stepping with RW for hip strengthening and coordination with mod A, blocked practice. Pt improves with blocked practice techniques and min tactile cues to improve proprioceptive input. Pt left in bed with alarm set, needs at hand.  Therapy Documentation Precautions:  Precautions Precautions: Fall Restrictions Weight Bearing Restrictions: No Pain: Pain Assessment Pain Scale: 0-10 Pain Score: 0-No pain    Therapy/Group: Individual Therapy  Janeese Mcgloin 03/17/2019, 10:00 AM

## 2019-03-17 NOTE — Progress Notes (Signed)
Physical Therapy Session Note  Patient Details  Name: Milind Raether MRN: [id number] Date of Birth: [birth year]  Today's Date: 03/17/2019 PT Individual Time: 7106-2694 PT Individual Time Calculation (min): 76 min   Short Term Goals: Week 1:  PT Short Term Goal 1 (Week 1): Pt will complete least restrictive transfer with mod A PT Short Term Goal 2 (Week 1): Pt will initiate gait training as safe and able PT Short Term Goal 3 (Week 1): Pt will perform bed mobility with Supervision  Skilled Therapeutic Interventions/Progress Updates:    Pt received supine in bed and agreeable to therapy session. Supine>sit using bed features with supervision for safety. Squat pivot transfer EOB>w/c with mod assist for initiating lifting and pivoting hips. Performed B UE w/c propulsion ~224ft with supervision. Used litegait body weight support system overground to ambulate a total of 181ft with 1 seated rest break (required +2 assist for management of litegait) therapist providing manual facilitation for lateral weightshifting with cuing to improve the following gait deviations: impaired B LE proprioception resulting in impaired coordination of movement, narrow BOS (intermittently stepping on his opposite foot), decreased hip extension causing forward trunk lean, decreased B LE step length, foot clearance, and lack of heel strike on initial contact. Transported to other gym. Squat pivot w/c>EOM with min assist for lifting/pivoting. Sit>supine with min assist for B LE management.  Performed the following exercises focusing on B LE neuro re-ed: - supine bridging with pt demonstrating inability to maintain hip alignment due to knees falling out laterally without assistance therefore modified to combine hip adduction with bridging by squeezing a ball between knees 3 x 12-15 repetitions.  - supine heel slides with pt demonstrating inability to control eccentric descent in L LE requiring assistance for control but able to  control with R LE 2x12 reps - supine hip abduction against level 1 theraband with pt demonstrating increased difficulty controlling R LE  2x12 reps Multimodal cuing throughout for improved technique/form for proper muscle activation. Supine>sit with min assist and cuing for logroll technique for increased pt independence. Squat pivot EOM>w/c with min assist.for lifting/pivoting hips. Pt transported back to room in w/c and left sitting in w/c with needs in reach and seat belt alarm on.    Therapy Documentation Precautions:  Precautions Precautions: Fall Restrictions Weight Bearing Restrictions: No  Pain: Denies pain during session.   Therapy/Group: Individual Therapy  Tawana Scale, PT, DPT 03/17/2019, 12:37 PM

## 2019-03-18 ENCOUNTER — Inpatient Hospital Stay (HOSPITAL_COMMUNITY): Payer: Medicare Other | Admitting: Physical Therapy

## 2019-03-18 ENCOUNTER — Inpatient Hospital Stay (HOSPITAL_COMMUNITY): Payer: Medicare Other | Admitting: Occupational Therapy

## 2019-03-18 LAB — BASIC METABOLIC PANEL
Anion gap: 9 (ref 5–15)
BUN: 25 mg/dL — ABNORMAL HIGH (ref 8–23)
CO2: 22 mmol/L (ref 22–32)
Calcium: 8 mg/dL — ABNORMAL LOW (ref 8.9–10.3)
Chloride: 105 mmol/L (ref 98–111)
Creatinine, Ser: 1.01 mg/dL (ref 0.61–1.24)
GFR calc Af Amer: >60 mL/min (ref >60)
GFR calc non Af Amer: >60 mL/min (ref >60)
Glucose, Bld: 135 mg/dL — ABNORMAL HIGH (ref 70–99)
Potassium: 3.8 mmol/L (ref 3.5–5.1)
Sodium: 136 mmol/L (ref 135–145)

## 2019-03-18 LAB — GLUCOSE, CAPILLARY
Glucose-Capillary: 132 mg/dL — ABNORMAL HIGH (ref 70–99)
Glucose-Capillary: 120 mg/dL — ABNORMAL HIGH (ref 70–99)
Glucose-Capillary: 149 mg/dL — ABNORMAL HIGH (ref 70–99)
Glucose-Capillary: 134 mg/dL — ABNORMAL HIGH (ref 70–99)

## 2019-03-18 MED ORDER — DEXAMETHASONE 4 MG PO TABS
4.0000 mg | ORAL_TABLET | Freq: Four times a day (QID) | ORAL | Status: AC
  Administered 2019-03-18 (×2): 4 mg via ORAL
  Filled 2019-03-18 (×2): qty 1

## 2019-03-18 MED ORDER — DEXAMETHASONE 2 MG PO TABS
3.0000 mg | ORAL_TABLET | Freq: Four times a day (QID) | ORAL | Status: DC
  Administered 2019-03-19 – 2019-03-20 (×6): 3 mg via ORAL
  Filled 2019-03-18 (×6): qty 1.5
  Filled 2019-03-18 (×2): qty 2
  Filled 2019-03-18: qty 1.5
  Filled 2019-03-18: qty 2

## 2019-03-18 NOTE — Progress Notes (Signed)
[facility] PHYSICAL MEDICINE & REHABILITATION PROGRESS NOTE   Subjective/Complaints:  No issues overnite  ROS: Denies CP, SOB, N/V/D   Objective:   No results found. No results for input(s): WBC, HGB, HCT, PLT in the last 72 hours. Recent Labs    03/18/19 0425  NA 136  K 3.8  CL 105  CO2 22  GLUCOSE 135*  BUN 25*  CREATININE 1.01  CALCIUM 8.0*    Intake/Output Summary (Last 24 hours) at 03/18/2019 0751 Last data filed at 03/18/2019 0634 Gross per 24 hour  Intake 480 ml  Output 4354 ml  Net -3874 ml     Physical Exam: Vital Signs Blood pressure 133/86, pulse (!) 59, temperature 98.4 F (36.9 C), temperature source Oral, resp. rate 16, height 6' (1.829 m), weight 87.4 kg, SpO2 100 %. Constitutional: No distress . Vital signs reviewed. HENT: Normocephalic.  Atraumatic. Eyes: EOMI.  No discharge. Cardiovascular: No JVD. Respiratory: Normal effort. GI: Non-distended. Musc: No edema or tenderness in extremities. Neurological: He is alert.  Motor: Bilateral upper extremities: 5/5 proximal distal, stable  lower extremities: Right Hip flexion, knee extension 4/5, ankle dorsiflexion 4/5, stable Left LE 4- + dysmetria , LLE Reduced sensationm to LT and proprio on Left  Skin: Skin is warm and dry.  Psychiatric: pleasant   Assessment/Plan: 1. 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  Physiatrist is providing close team supervision and 24 hour management of active medical problems listed below.  Physiatrist and rehab team continue to assess barriers to discharge/monitor patient progress toward functional and medical goals  Care Tool:  Bathing    Body parts bathed by patient: Right arm, Left arm, Chest, Abdomen, Face   Body parts bathed by helper: Buttocks, Right lower leg, Left lower leg     Bathing assist Assist Level: Set  up assist     Upper Body Dressing/Undressing Upper body dressing   What is the patient wearing?: Pull over shirt    Upper body assist Assist Level: Set up assist    Lower Body Dressing/Undressing Lower body dressing      What is the patient wearing?: Underwear/pull up, Pants     Lower body assist Assist for lower body dressing: Maximal Assistance - Patient 25 - 49%     Toileting Toileting    Toileting assist Assist for toileting: Moderate Assistance - Patient 50 - 74%     Transfers Chair/bed transfer  Transfers assist     Chair/bed transfer assist level: Moderate Assistance - Patient 50 - 74%     Locomotion Ambulation   Ambulation assist   Ambulation activity did not occur: Safety/medical concerns  Assist level: Dependent - Patient 0% Assistive device: Lite Gait Max distance: 47ft   Walk 10 feet activity   Assist  Walk 10 feet activity did not occur: Safety/medical concerns  Assist level: Dependent - Patient 0% Assistive device: Lite Gait   Walk 50 feet activity   Assist Walk 50 feet with 2 turns activity did not occur: Safety/medical concerns  Assist level: Dependent - Patient 0% Assistive device: Lite Gait    Walk 150 feet activity   Assist Walk 150 feet activity did not occur: Safety/medical concerns         Walk 10 feet on uneven surface  activity   Assist Walk 10 feet on uneven surfaces activity did not occur: Safety/medical concerns  Wheelchair     Assist Will patient use wheelchair at discharge?: Yes Type of Wheelchair: Manual    Wheelchair assist level: Supervision/Verbal cueing Max wheelchair distance: 25ft    Wheelchair 50 feet with 2 turns activity    Assist        Assist Level: Supervision/Verbal cueing   Wheelchair 150 feet activity     Assist     Assist Level: Supervision/Verbal cueing    Medical Problem List and Plan: 1.  Deficits with mobility, endurance, self-care secondary  to thoracic myelopathy/metastatic prostate cancer status post decompression .  Continue CIR -decadron  4mg  7/7, taper to 3mg  q6 h on 7/9  2.  Antithrombotics: -DVT/anticoagulation:  Mechanical: Sequential compression devices, below knee Bilateral lower extremities             -antiplatelet therapy: N/A 3. Pain Management: Hydrocodone prn effective.  4. Mood: LCSW to follow for evaluation and support.              -antipsychotic agents: N/A 5. Neuropsych: This patient is capable of making decisions on his own behalf. 6. Skin/Wound Care: Monitor  Incision daily for healing.  7. Fluids/Electrolytes/Nutrition: encourage po 8. HTN: Monitor BP tid--continue Coreg and Norvasc.  Patient Vitals for the past 24 hrs:  BP Temp Temp src Pulse Resp SpO2 Weight  03/18/19 0715 - - - - - - 87.4 kg  03/18/19 0500 133/86 98.4 F (36.9 C) Oral (!) 59 16 100 % -  03/17/19 1927 116/66 98.1 F (36.7 C) - 61 19 99 % -  03/17/19 1447 121/68 98.6 F (37 C) - 62 17 100 % -   Relatively controlled on 7/8 9. Neuropathy: Reports improvement in sensation BLE---almost normal in fact.  - Continue Neurontin at bedtime.   Monitor pain with increased mobility. 10. ?neurogenic bowel/bladder: Improving 11.  Steroid-induced hyperglycemia:  Will not make any changes while weaning steroids  -monitor CBG's BID.    -cover with SSI CBG (last 3)  Recent Labs    03/17/19 1653 03/17/19 2125 03/18/19 0643  GLUCAP 120* 163* 132*    Improving control  7/7- no changes 12. ABLA:  Hemoglobin 8.6 on 7/3  Continue to monitor  -no clinical signs of blood loss 13.  Hypoalbuminemia  Protein supplementation 14.  Transaminitis- may be related to Zocor  LFTs elevated on 7/2  Continue to monitor  LOS: 7 days A FACE TO FACE EVALUATION WAS PERFORMED  Charlett Blake 03/18/2019, 7:51 AM

## 2019-03-18 NOTE — Progress Notes (Signed)
Physical Therapy Session Note  Patient Details  Name: Dennis Fischer MRN: [id number] Date of Birth: 17-May-1946  Today's Date: 03/18/2019 PT Individual Time: 0950-1030 PT Individual Time Calculation (min): 40 min   Short Term Goals: Week 1:  PT Short Term Goal 1 (Week 1): Pt will complete least restrictive transfer with mod A PT Short Term Goal 2 (Week 1): Pt will initiate gait training as safe and able PT Short Term Goal 3 (Week 1): Pt will perform bed mobility with Supervision  Skilled Therapeutic Interventions/Progress Updates:   Pt received sitting in WC and agreeable to PT. WC mobility instructed by PT 2 x 252f with supervision assist. Min cues for turning technique and safety through doorways.   [facility] with min-mod assist x 8 throughout treatment with moderate cues for set up, and improved anterior weight shifting. Blocked practice stand pivot transfer to and from mat table x 4 with mod assist overall form PT and max cues for gait pattern and AD management.  Gait in parallel bars 360fforwar/backward with mod assist from PT, very poor coordination of movement posteriorly requiring max cues for awareness of LE positioning. Gait training also performed with RW x 2031fith mod assist + 2 for WC follow. Max cues for gait pattern, step length and width. Mild R knee instability for last 3 steps, but no LOB.   Pt returned to room and performed squat pivot transfer to bed with min A for safety. Sit>supine completed with min assist for R LE management, and left supine in bed with call bell in reach and all needs met.        Therapy Documentation Precautions:  Precautions Precautions: Fall Restrictions Weight Bearing Restrictions: No Vital Signs: Therapy Vitals Temp: 98.3 F (36.8 C) Temp Source: Oral Pulse Rate: 63 Resp: 18 BP: 123/69 Patient Position (if appropriate): Lying Oxygen Therapy SpO2: 100 % O2 Device: Room Air Pain: Pain  Assessment Pain Scale: 0-10 Pain Score: 0-No pain   Therapy/Group: Individual Therapy  AusLorie Phenix8/2020, 5:30 PM

## 2019-03-18 NOTE — Patient Care Conference (Signed)
Inpatient RehabilitationTeam Conference and Plan of Care Update Date: 03/17/2019   Time: 10:10 AM    Patient Name: Dennis Fischer      Medical Record Number: [id number]  Date of Birth: August 01, 1946 Sex: Male         Room/Bed: 4M09C/4M09C-01 Payor Info: Payor: MEDICARE / Plan: MEDICARE PART A AND B / Product Type: *No Product type* /    Admitting Diagnosis: 1. SCI Team  Other NTSC dysf, secondary malignant neoplasm of bone 15-17days  Admit Date/Time:  03/11/2019  6:54 PM Admission Comments: No comment available   Primary Diagnosis:  <principal problem not specified> Principal Problem: <principal problem not specified>  Patient Active Problem List   Diagnosis Date Noted  . Transaminitis   . Hypoalbuminemia due to protein-calorie malnutrition ([place])   . Acute blood loss anemia   . Steroid-induced hyperglycemia   . Essential hypertension   . Labile blood pressure   . Metastatic cancer to spine ([place]) 03/11/2019  . Neuropathic pain   . Benign essential HTN   . Cocaine use 03/10/2019  . Cord compression myelopathy ([place])   . Spinal cord compression ([place]) 03/09/2019  . Prostate cancer metastatic to bone ([place]) 12/22/2018  . Port-A-Cath in place 07/02/2018  . Goals of care, counseling/discussion 01/20/2018  . Right knee pain 01/17/2018  . Prostate cancer ([place]) 01/22/2017  . Prediabetes 03/30/2016  . Positive FIT (fecal immunochemical test) 11/22/2015  . GERD (gastroesophageal reflux disease) 12/21/2014  . Age-related nuclear cataract of both eyes 01/13/2013  . Hypertensive retinopathy 01/13/2013  . Essential (primary) hypertension 11/25/2012  . Pure hypercholesterolemia 11/25/2012  . History of total hip replacement 08/28/2010  . Tobacco dependence syndrome 03/22/2006    Expected Discharge Date: Expected Discharge Date: 03/27/19  Team Members Present: Physician leading conference: Dr. Delice Lesch Social Worker Present: Lennart Pall, LCSW Nurse Present: Genene Churn, RN PT Present:  Excell Seltzer, PT OT Present: Amy Rounds, OT SLP Present: Charolett Bumpers, SLP PPS Coordinator present : Gunnar Fusi, SLP     Current Status/Progress Goal Weekly Team Focus  Medical   Patient has increased sensation in lower extremities, strength improving slowly.  Maintain medical stability reduce fall risk reduce pain  Discharge planning, wean of Decadron monitoring blood sugars as well as neuro exam   Bowel/Bladder   Continent b/b. LBM 03/16/2019  Remain continent b/b.  Assess toileting needs and assist prn   Swallow/Nutrition/ Hydration             ADL's   Mod A stand pivot transfers; max A toileting; mod A LB dressing; set-up UB bathing/dressing and grooming tasks from seated position  Mod I w/c level tasks, CGA standing balance and toileting tasks.  functional transfers, ADL re-training, AE training, functional activity tolerance, standing balance and endurance   Mobility   Supervision bed mobility, min to mod A transfers, Supervision w/c mobility, mod A gait x 2' RW  mod I at w/c level, min A transfers and short distance gait  LE NMR, transfers, standing, gait initiation   Communication             Safety/Cognition/ Behavioral Observations            Pain   no complaints of pain  remain pain free, or 3 or less/10  assess pain q shift and prn, medicate as ordered   Skin   steristrips to spine, skin clean dry intact, no s/s of infection  remain free of infection and breakdown  assess skin q shift and  prn    Rehab Goals Patient on target to meet rehab goals: Yes *See Care Plan and progress notes for long and short-term goals.     Barriers to Discharge  Current Status/Progress Possible Resolutions Date Resolved   Physician    Medical stability     Progressing towards goals  Continue rehab, wean steroids, monitor CBGs      Nursing                  PT  Medical stability;Home environment access/layout;Pending chemo/radiation                 OT                  SLP                 SW                Discharge Planning/Teaching Needs:  Pt to d/c home with girlfriend who can provide 24/7 assistance.  Teaching needs TBD   Team Discussion:  Steroid taper;  Monitor labs;  Mostly continent.  occ back pain.  Min - mod tfs with supervision to CGA goals for ADLs.   Few steps with mod - max assist.  Expected mod ind w/c level and short distance amb.   Revisions to Treatment Plan:  NA    Continued Need for Acute Rehabilitation Level of Care: The patient requires daily medical management by a physician with specialized training in physical medicine and rehabilitation for the following conditions: Daily direction of a multidisciplinary physical rehabilitation program to ensure safe treatment while eliciting the highest outcome that is of practical value to the patient.: Yes Daily medical management of patient stability for increased activity during participation in an intensive rehabilitation regime.: Yes Daily analysis of laboratory values and/or radiology reports with any subsequent need for medication adjustment of medical intervention for : Neurological problems;Diabetes problems   I attest that I was present, lead the team conference, and concur with the assessment and plan of the team.   Donata Clay, Shye Doty 03/18/2019, 4:20 PM    Team conference was held via web/ teleconference due to [place] - 19

## 2019-03-18 NOTE — Progress Notes (Signed)
Occupational Therapy Session Note  Patient Details  Name: Dennis Fischer MRN: [id number] Date of Birth: 1945/10/23  Today's Date: 03/18/2019 OT Individual Time: 8338-2505 and 1300-1400 OT Individual Time Calculation (min): 75 min and 60 min   Short Term Goals: Week 1:  OT Short Term Goal 1 (Week 1): Pt will transfer to toilet/BSC without use of mechanical lift OT Short Term Goal 2 (Week 1): Pt will don pants sit>stand with steadying assist using LRAD and AE PRN OT Short Term Goal 3 (Week 1): Pt will complete toileting task with mod A using LRAD in order to reduce caregiver burden OT Short Term Goal 4 (Week 1): Pt will bathe seated EOB/ w/c at sink with mod A using LRAD and AE PRN  Skilled Therapeutic Interventions/Progress Updates:    Session One: Pt seen for OT ADL bathing/dressing session. PT sitting up in bed upon arrival, eating breakfast. Pt agreeable to transferring to sitting EOB with supervision using hospital bed functions. He ate breakfast seated EOB. Discussed at length d/c planning. PT not happy with planned d/c of next Friday, believes he is ready to go home now. Pt with limited insight into deficits, believes now that he knows how to transfer he can go home. Pt without awareness of mod-max skilled assist being provided during transfers. Educated regarding pt's CLOF, deficits and functional implications, reducing fall risk, reducing caregiver burden, OT/PT goals and d/c planning. Pt cont to be adamant he is ready for d/c.  He completed bathing/dressing routine seated on the EOB. Set-up/supervision for UB bathing/dressing. Min A for LB bathing (not including pericare/buttock hygiene which pt refused). Min A for obtaining and maintaining figure four sitting position in order to wash B feet and apply lotion. He dressed seated EOB , able to independently recall use of AE in order to use reacher and sock aid to dress. HE completed lateral leans in order to pull pants up, declining standing  to pull pants up.  Completed stand pivot transfer to w/c, mod A to stand from slightly elevated EOB. Max A stand pivot transfer with RW, several instances of knees buckling requiring mod-max A to prevent LOB, max cuing for sequencing/technique for management and coordination of RW. Following transfer, pt stated he believed he could have safely done that transfer independently at home.  He completed grooming tasks from w/c level at sink with set-up.  Pt left seated in w/c at end of session, all needs in reach with chair belt alarm on.   Session Two: Pt seen for OT session focusing on functional standing balance/endurandce and neuro re-ed with B LEs. Pt in supine upon arrival, denying pain and agreeable to tx session.  He completed squat pivot transfers throughout session with close supervision and VCs for technique.  He self propelled w/c throughout unit with supervision for UE strengthening and endurance. Completed sit>stand from EOM to RW with min-mod A, VCs for hand placement. Min- occasional mod A overall for static standing balance and mod A for controlled descent onto mat.   Placed #3 ankle weights on B LEs for increased proprioceptual input. Completed toe taps to target focusing on controlled movements 2/2 ataxia. Pt intially min-mod A for dynamic standing balance with RW, max A when fatigued and cuing for controlled sit.  Pt returned to supine on mat. Completed x10 glute bridges and x10 "clam shells" on R/L with tactile cuing for proper form/technique. Pt with very limited hip ABduction, pt reports its due to his "heavy legs" vs. Muscle weakness. He  cont to demonstrate poor insight into deficits.  He returned to w/c and self propelled w/c back to room. Pt left sitting up in w/c at end of session, all needs in reach.   Therapy Documentation Precautions:  Precautions Precautions: Fall Restrictions Weight Bearing Restrictions: No Pain:   No/denies pain   Therapy/Group: Individual  Therapy  Myreon Wimer L 03/18/2019, 6:49 AM

## 2019-03-19 ENCOUNTER — Inpatient Hospital Stay (HOSPITAL_COMMUNITY): Payer: Medicare Other | Admitting: Physical Therapy

## 2019-03-19 ENCOUNTER — Inpatient Hospital Stay (HOSPITAL_COMMUNITY): Payer: Medicare Other | Admitting: Occupational Therapy

## 2019-03-19 LAB — GLUCOSE, CAPILLARY
Glucose-Capillary: 124 mg/dL — ABNORMAL HIGH (ref 70–99)
Glucose-Capillary: 115 mg/dL — ABNORMAL HIGH (ref 70–99)
Glucose-Capillary: 132 mg/dL — ABNORMAL HIGH (ref 70–99)
Glucose-Capillary: 118 mg/dL — ABNORMAL HIGH (ref 70–99)

## 2019-03-19 NOTE — Progress Notes (Signed)
Social Work Patient ID: Dennis Fischer, male   DOB: [birth year], 73 y.o.   MRN: [id number]   Pt aware and agreeable with targeted d/c date of 7/17 and mod ind w/c - min assist goals overall.  Continue to follow.  Netta Fodge, LCSW

## 2019-03-19 NOTE — Progress Notes (Signed)
PHYSICAL MEDICINE & REHABILITATION PROGRESS NOTE   Subjective/Complaints:  Participating well with OT, good appetite  ROS: Denies CP, SOB, N/V/D   Objective:   No results found. No results for input(s): WBC, HGB, HCT, PLT in the last 72 hours. Recent Labs    03/18/19 0425  NA 136  K 3.8  CL 105  CO2 22  GLUCOSE 135*  BUN 25*  CREATININE 1.01  CALCIUM 8.0*    Intake/Output Summary (Last 24 hours) at 03/19/2019 0746 Last data filed at 03/19/2019 0731 Gross per 24 hour  Intake 1120 ml  Output 3175 ml  Net -2055 ml     Physical Exam: Vital Signs Blood pressure 124/73, pulse 61, temperature 98 F (36.7 C), temperature source Oral, resp. rate 16, height 6' (1.829 m), weight 84.8 kg, SpO2 100 %. Constitutional: No distress . Vital signs reviewed. HENT: Normocephalic.  Atraumatic. Eyes: EOMI.  No discharge. Cardiovascular: No JVD. Respiratory: Normal effort. GI: Non-distended. Musc: No edema or tenderness in extremities. Neurological: He is alert.  Motor: Bilateral upper extremities: 5/5 proximal distal, stable  lower extremities: Right Hip flexion, knee extension 4/5, ankle dorsiflexion 4/5, stable Left LE 4- + dysmetria , LLE Reduced sensationm to LT and proprio on Left  Skin: Skin is warm and dry.  Psychiatric: pleasant   Assessment/Plan: 1. 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  Physiatrist is providing close team supervision and 24 hour management of active medical problems listed below.  Physiatrist and rehab team continue to assess barriers to discharge/monitor patient progress toward functional and medical goals  Care Tool:  Bathing    Body parts bathed by patient: Right arm, Left arm, Chest, Abdomen, Face, Left lower leg, Right lower leg, Left upper leg, Right upper leg   Body parts bathed by  helper: Front perineal area, Buttocks     Bathing assist Assist Level: Moderate Assistance - Patient 50 - 74%     Upper Body Dressing/Undressing Upper body dressing   What is the patient wearing?: Pull over shirt    Upper body assist Assist Level: Set up assist    Lower Body Dressing/Undressing Lower body dressing      What is the patient wearing?: Underwear/pull up, Pants     Lower body assist Assist for lower body dressing: Minimal Assistance - Patient > 75%     Toileting Toileting    Toileting assist Assist for toileting: Moderate Assistance - Patient 50 - 74%     Transfers Chair/bed transfer  Transfers assist     Chair/bed transfer assist level: Moderate Assistance - Patient 50 - 74%     Locomotion Ambulation   Ambulation assist   Ambulation activity did not occur: Safety/medical concerns  Assist level: 2 helpers Assistive device: Walker-rolling Max distance: 20   Walk 10 feet activity   Assist  Walk 10 feet activity did not occur: Safety/medical concerns  Assist level: 2 helpers Assistive device: Walker-platform   Walk 50 feet activity   Assist Walk 50 feet with 2 turns activity did not occur: Safety/medical concerns  Assist level: Dependent - Patient 0% Assistive device: Lite Gait    Walk 150 feet activity   Assist Walk 150 feet activity did not occur: Safety/medical concerns         Walk 10 feet on uneven surface  activity   Assist Walk 10 feet on uneven surfaces activity did  not occur: Safety/medical concerns         Wheelchair     Assist Will patient use wheelchair at discharge?: Yes Type of Wheelchair: Manual    Wheelchair assist level: Supervision/Verbal cueing Max wheelchair distance: 200    Wheelchair 50 feet with 2 turns activity    Assist        Assist Level: Supervision/Verbal cueing   Wheelchair 150 feet activity     Assist     Assist Level: Supervision/Verbal cueing    Medical  Problem List and Plan: 1.  Deficits with mobility, endurance, self-care secondary to thoracic myelopathy/metastatic prostate cancer status post decompression .  Continue CIR PT,OT requires ModA x 2 for amb -decadron  taper to 3mg  q6 h on 7/9, plan on 2mg  q6 on 7/11  2.  Antithrombotics: -DVT/anticoagulation:  Mechanical: Sequential compression devices, below knee Bilateral lower extremities             -antiplatelet therapy: N/A 3. Pain Management: Hydrocodone prn effective.  4. Mood: LCSW to follow for evaluation and support.              -antipsychotic agents: N/A 5. Neuropsych: This patient is capable of making decisions on his own behalf. 6. Skin/Wound Care: Monitor  Incision daily for healing.  7. Fluids/Electrolytes/Nutrition: encourage po 8. HTN: Monitor BP tid--continue Coreg and Norvasc.  Patient Vitals for the past 24 hrs:  BP Temp Temp src Pulse Resp SpO2 Weight  03/19/19 0352 124/73 98 F (36.7 C) Oral 61 16 100 % 84.8 kg  03/18/19 1926 122/70 98 F (36.7 C) - 64 18 100 % -  03/18/19 1527 123/69 98.3 F (36.8 C) Oral 63 18 100 % -   Relatively controlled on 7/8 9. Neuropathy: Reports improvement in sensation BLE---almost normal in fact.  - Continue Neurontin at bedtime.   Monitor pain with increased mobility. 10. ?neurogenic bowel/bladder: Improving 11.  Steroid-induced hyperglycemia:  Will not make any changes while weaning steroids  -monitor CBG's BID.    -cover with [facility] CBG (last 3)  Recent Labs    03/18/19 1626 03/18/19 2106 03/19/19 0613  GLUCAP 149* 134* 124*    Improving control  7/8- no changes 12. ABLA:  Hemoglobin 8.6 on 7/3  Continue to monitor  -no clinical signs of blood loss 13.  Hypoalbuminemia  Protein supplementation 14.  Transaminitis- may be related to Zocor  LFTs elevated on 7/2  Continue to monitor  LOS: 8 days A FACE TO FACE EVALUATION WAS PERFORMED  Charlett Blake 03/19/2019, 7:46 AM

## 2019-03-19 NOTE — Progress Notes (Signed)
Physical Therapy Weekly Progress Note  Patient Details  Name: Dennis Fischer MRN: [id number] Date of Birth: [birth year]  Beginning of progress report period: March 12, 2019 End of progress report period: March 19, 2019  Today's Date: 03/19/2019 PT Individual Time: 1100-1200 PT Individual Time Calculation (min): 60 min   Patient has met 3 of 3 short term goals.  Pt is able to complete bed mobility with Supervision, squat pivot transfers with min A, stand pivot transfers with mod to max A, and is able to perform gait with assist x 2 with RW or dependently via overhead tracking system such as the LiteGait for improved safety with training. Pt is a Supervision level for w/c mobility. Pt is making slow but steady progress in therapy and is limited by decreased insight and safety awareness as well as ongoing BLE weakness and ataxia.  Patient continues to demonstrate the following deficits muscle weakness, abnormal tone, unbalanced muscle activation, ataxia and decreased coordination and decreased standing balance, decreased postural control and decreased balance strategies and therefore will continue to benefit from skilled PT intervention to increase functional independence with mobility.  Patient progressing toward long term goals..  Continue plan of care.  PT Short Term Goals Week 1:  PT Short Term Goal 1 (Week 1): Pt will complete least restrictive transfer with mod A PT Short Term Goal 1 - Progress (Week 1): Met PT Short Term Goal 2 (Week 1): Pt will initiate gait training as safe and able PT Short Term Goal 2 - Progress (Week 1): Met PT Short Term Goal 3 (Week 1): Pt will perform bed mobility with Supervision PT Short Term Goal 3 - Progress (Week 1): Met Week 2:  PT Short Term Goal 1 (Week 2): =LTG due to ELOS  Skilled Therapeutic Interventions/Progress Updates:    Pt received seated in w/c in room, agreeable to PT session. No complaints of pain but does report feeling fatigued this AM. Manual  w/c propulsion 2 x 150 ft with use of BUE and Supervision. Sit to stand with mod A to RW for placement of LiteGait sling. Ambulation 2 x 25 ft in LiteGait while holding on to LiteGait handles, x 25 ft with use of RW in LiteGait. Pt exhibits ataxic BLE with gait with some scissoring and narrow BOS noted. Pt exhibits poor proprioception of limbs and is unable to correctly place them when steps without being able to visualize them. Pt also tends to lean posteriorly in standing with fair ability to follow multimodal cueing for upright posture and anterior weight shift. When having pt turn around in standing from facing LiteGait to turn and face RW he requires max cueing for UE and LE placement during transfer and exhibits poor understanding of need to keep LE on the ground when turning, however only requires min cuing when turning back around from facing RW to facing LiteGait. Squat pivot transfer w/c to bed with min A. Sit to supine Supervision. Pt left semi-reclined in bed with needs in reach at end of session.  Therapy Documentation Precautions:  Precautions Precautions: Fall Restrictions Weight Bearing Restrictions: No   Therapy/Group: Individual Therapy   Excell Seltzer, PT, DPT 03/19/2019, 7:59 AM

## 2019-03-19 NOTE — Progress Notes (Signed)
Occupational Therapy Weekly Progress Note  Patient Details  Name: Dennis Fischer MRN: [id number] Date of Birth: 1946-02-22  Beginning of progress report period: March 12, 2019 End of progress report period: March 19, 2019  Today's Date: 03/19/2019 OT Individual Time: 0730-0830 and 1300-1400 OT Individual Time Calculation (min): 60 min and 60 min   Patient has met 4 of 4 short term goals.  Pt is making steady progress towards OT goals. He cont to be most limited by B LE weakness and ataxia impacting abilitity to complete functional transfers and standing level ADLs safely. He can complete squat pivot transfers with CGA. Mod-max A for stand pivot transfers using RW. He requires mod A overall for dynamic standing balance with limited standing endurance.  Pt does display decreased insight into severity of deficits and functional implications as well as his very high fall risk.   Patient continues to demonstrate the following deficits: muscle weakness and muscle paralysis, decreased cardiorespiratoy endurance, ataxia and decreased coordination and decreased sitting balance, decreased standing balance, decreased postural control, decreased balance strategies and difficulty maintaining precautions and therefore will continue to benefit from skilled OT intervention to enhance overall performance with BADL and Reduce care partner burden.  Patient progressing toward long term goals..  Continue plan of care.  OT Short Term Goals Week 1:  OT Short Term Goal 1 (Week 1): Pt will transfer to toilet/BSC without use of mechanical lift OT Short Term Goal 1 - Progress (Week 1): Met OT Short Term Goal 2 (Week 1): Pt will don pants sit>stand with steadying assist using LRAD and AE PRN OT Short Term Goal 2 - Progress (Week 1): Met OT Short Term Goal 3 (Week 1): Pt will complete toileting task with mod A using LRAD in order to reduce caregiver burden OT Short Term Goal 3 - Progress (Week 1): Met OT Short Term Goal 4  (Week 1): Pt will bathe seated EOB/ w/c at sink with mod A using LRAD and AE PRN OT Short Term Goal 4 - Progress (Week 1): Met Week 2:  OT Short Term Goal 1 (Week 2): STG=LTG due to LOS  Skilled Therapeutic Interventions/Progress Updates:    Session One: Pt seen for OT ADL bathing/dressing session. Pt awake in supine upon arrival, denying pain and agreeable to tx session.  He transferred to sitting EOB with supervision using hospital bed functions. He donned shoes seated EOB using LH shoe horn to assist. Pt displays incresaed control of LEs when donning shoes.  He completed stand pivot transfer to w/c using RW with mod A to power into standing, initially min A for stand/step pattern, however, during turning and back up to w/c, pt with B knees buckling with decreased coordination of foot placement leading to narrow BOS, ultimately requiring max A to prevent fall and position hips over w/c in order to turn to w/c.  He completed grooming tasks and UB bathing/dressing from w/c level at sink mod I. He completed x3 sit>stands in total during LB bathing/dressing routine. Min-mod A to power into standing using sink for support. Mod A standing balance with B knees either hyperextending or buckling, demonstrating very poor knee control abilities, mod-max A for controlled descent back into w/c.  Following seated rest break, pt instructed into w/c propulsion technique using B LEs with emphasis on controlled movements and LE strengthening. Completed w/c propulsion ~73f with increased time and cuing. He returned to room at end of session, opted to stay sitting in w/c at end of session,  all needs in reach and chair belt alarm on.   Session Two: Pt seen for OT session focusing on LE neuo-re-ed in prep for functional standing balance/endurance. Pt sitting up in bed upon arrival, finishing lunch and agreeable to tx session. He completed squat pivot transfers throughout session with min A and min VCs for set-up of  equipment.  He self propelled w/c throughout unit with supervision for UE strengthening/endurance.  In therapy gym, completed sit>Stand from EOM, min-mod A to power into standing to RW. Completed toe taps on small targets while standing focusing on LE controlled movements. #3 ankle weights applied to B ankle for increase proprioceptive input. Graded task up to toe taps on 6" cones, min-mod A for standing balance and pt able to clear cone and return to controlled stance. Mod-max A for controlled descent back onto mat btwn trials. He completed mod A stand pivot transfer back to w/c and self propelled back to room. Min A squat pivot transfer to return to EOB and supervision to return to supine. Pt left in supine at end of session, all needs in reach with bed alarm on and NT present collecting vitals.   Therapy Documentation Precautions:  Precautions Precautions: Fall Restrictions Weight Bearing Restrictions: No Pain:   No/denies pain    Therapy/Group: Individual Therapy  [place] Dani L 03/19/2019, 6:52 AM

## 2019-03-20 ENCOUNTER — Inpatient Hospital Stay (HOSPITAL_COMMUNITY): Payer: Medicare Other | Admitting: Physical Therapy

## 2019-03-20 ENCOUNTER — Inpatient Hospital Stay (HOSPITAL_COMMUNITY): Payer: Medicare Other | Admitting: Occupational Therapy

## 2019-03-20 LAB — GLUCOSE, CAPILLARY
Glucose-Capillary: 138 mg/dL — ABNORMAL HIGH (ref 70–99)
Glucose-Capillary: 107 mg/dL — ABNORMAL HIGH (ref 70–99)
Glucose-Capillary: 139 mg/dL — ABNORMAL HIGH (ref 70–99)
Glucose-Capillary: 146 mg/dL — ABNORMAL HIGH (ref 70–99)

## 2019-03-20 MED ORDER — DEXAMETHASONE 4 MG PO TABS
2.0000 mg | ORAL_TABLET | Freq: Four times a day (QID) | ORAL | Status: DC
  Administered 2019-03-21 – 2019-03-23 (×10): 2 mg via ORAL
  Filled 2019-03-20 (×9): qty 1

## 2019-03-20 NOTE — Progress Notes (Signed)
Physical Therapy Session Note  Patient Details  Name: Dennis Fischer MRN: [id number] Date of Birth: 06/16/1946  Today's Date: 03/20/2019 PT Individual Time: 1100-1200; 2694-8546 PT Individual Time Calculation (min): 60 min and 70 min  Short Term Goals: Week 2:  PT Short Term Goal 1 (Week 2): =LTG due to ELOS  Skilled Therapeutic Interventions/Progress Updates:    Session 1: Pt received seated in bed, agreeable to PT session. No complaints of pain. Bed mobility Supervision. Min A to don shoes while seated EOB. Squat pivot transfer bed to w/c with min A with decreased cueing needed to setup and perform safe transfer. Manual w/c propulsion x 150 ft with use of BUE and Supervision. Sit to stand in // bars with CGA. Session focus on standing BLE coordination and NMR training. Alt L/R forward/backward steps, target taps, and line step-overs with min A and BUE support on // bars for balance. Pt exhibits ongoing ataxia in B limbs with L>R. Ascend/descend one 1" step in // bars with mod A and BUE support, one instance of near LOB posteriorly when stepping down backwards with LLE due to poor ability to position LE on floor in safe position. Education with patient that he will likely not be able to perform stairs safely at home, he reports his landlord is building a ramp so he can enter his apartment safely. Also educated pt that he will likely be at w/c level at home and will not be ambulating on his own. Pt claims that he has different equipment at home and that things will be easier for him in that setting. Education with pt that if is not safe to him to ambulate by himself in therapy in a controlled setting then it will definitely not be safe for him to perform at home on his own. Pt receptive to education with fair insight into deficits and functional implications. Squat pivot transfer w/c to therapy mat with min A. Supine to/from sit with Supervision on therapy mat. Supine BLE strengthening therex: bridges,  SKFO, heel slides x 10-20 reps B. Pt requests to return to bed at end of session. Squat pivot transfer back to bed with min A. Pt left semi-reclined in bed with needs in reach at end of session, bed alarm in place.  Session 2: Pt received seated in bed, agreeable to PT session. No complaints of pain. Bed mobility Supervision. Squat pivot transfer bed to w/c. Manual w/c propulsion x 150 ft with use of BUE and Supervision. Session focus on setting up w/c for safe transfer to/from mat table with min cueing for w/c angle, removal of w/c parts, and body positioning for safe transfer. Sit to stand with max A to RW. Pt requires mod A to maintain standing balance at RW with significant BLE fatigue noted this PM. Deferred further standing activity 2/2 pt fatigue. Stand pivot transfer w/c to Nustep with max A. Pt exhibits poor control of BLE and they become twisted and pt unsteady, requires max A to safely sit on Nustep. Nustep level 3 x 10 min with use of LE only for global strengthening, 3 seated rest breaks. Seated ball kicks with focus on LE control and coordination. Manual w/c propulsion x 50 ft with use of BLE only and CGA/min A for LE strengthening. Squat pivot transfer back to bed min A. Pt left semi-reclined in bed in care of NT at end of session.  Therapy Documentation Precautions:  Precautions Precautions: Fall Restrictions Weight Bearing Restrictions: No    Therapy/Group: Individual  Therapy   Excell Seltzer, PT, DPT  03/20/2019, 12:26 PM

## 2019-03-20 NOTE — Progress Notes (Signed)
[facility] PHYSICAL MEDICINE & REHABILITATION PROGRESS NOTE   Subjective/Complaints:  Patient is without new issues.  His bladder is working most of the time has had occasional catheterization.  He is requiring Dulcolax suppository for bowel program.  ROS: Denies CP, SOB, N/V/D   Objective:   No results found. No results for input(s): WBC, HGB, HCT, PLT in the last 72 hours. Recent Labs    03/18/19 0425  NA 136  K 3.8  CL 105  CO2 22  GLUCOSE 135*  BUN 25*  CREATININE 1.01  CALCIUM 8.0*    Intake/Output Summary (Last 24 hours) at 03/20/2019 0656 Last data filed at 03/20/2019 0553 Gross per 24 hour  Intake 600 ml  Output 1700 ml  Net -1100 ml     Physical Exam: Vital Signs Blood pressure 138/73, pulse 61, temperature 98.1 F (36.7 C), temperature source Oral, resp. rate 16, height 6' (1.829 m), weight 84.8 kg, SpO2 100 %. Constitutional: No distress . Vital signs reviewed. HENT: Normocephalic.  Atraumatic. Eyes: EOMI.  No discharge. Cardiovascular: No JVD. Respiratory: Normal effort. GI: Non-distended. Musc: No edema or tenderness in extremities. Neurological: He is alert.  Motor: Bilateral upper extremities: 5/5 proximal distal, stable  lower extremities: Right Hip flexion, knee extension 4/5, ankle dorsiflexion 4/5, stable Left LE 4- + dysmetria , LLE Reduced sensationm to LT and proprio on Left  Skin: Skin is warm and dry.  Psychiatric: pleasant   Assessment/Plan: 1. 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  Physiatrist is providing close team supervision and 24 hour management of active medical problems listed below.  Physiatrist and rehab team continue to assess barriers to discharge/monitor patient progress toward functional and medical goals  Care Tool:  Bathing    Body parts bathed by patient: Right arm,  Left arm, Chest, Abdomen, Face, Left lower leg, Right lower leg, Left upper leg, Right upper leg, Front perineal area, Buttocks   Body parts bathed by helper: Front perineal area, Buttocks     Bathing assist Assist Level: Moderate Assistance - Patient 50 - 74%     Upper Body Dressing/Undressing Upper body dressing   What is the patient wearing?: Pull over shirt    Upper body assist Assist Level: Independent    Lower Body Dressing/Undressing Lower body dressing      What is the patient wearing?: Underwear/pull up, Pants     Lower body assist Assist for lower body dressing: Minimal Assistance - Patient > 75%     Toileting Toileting    Toileting assist Assist for toileting: Moderate Assistance - Patient 50 - 74%     Transfers Chair/bed transfer  Transfers assist     Chair/bed transfer assist level: Minimal Assistance - Patient > 75%     Locomotion Ambulation   Ambulation assist   Ambulation activity did not occur: Safety/medical concerns  Assist level: 2 helpers Assistive device: Lite Gait Max distance: 25'   Walk 10 feet activity   Assist  Walk 10 feet activity did not occur: Safety/medical concerns  Assist level: 2 helpers Assistive device: Lite Gait   Walk 50 feet activity   Assist Walk 50 feet with 2 turns activity did not occur: Safety/medical concerns  Assist level: Dependent - Patient 0% Assistive device: Lite Gait    Walk 150 feet activity   Assist Walk 150 feet activity did not occur: Safety/medical concerns  Walk 10 feet on uneven surface  activity   Assist Walk 10 feet on uneven surfaces activity did not occur: Safety/medical concerns         Wheelchair     Assist Will patient use wheelchair at discharge?: Yes Type of Wheelchair: Manual    Wheelchair assist level: Supervision/Verbal cueing Max wheelchair distance: 200    Wheelchair 50 feet with 2 turns activity    Assist        Assist Level:  Supervision/Verbal cueing   Wheelchair 150 feet activity     Assist     Assist Level: Supervision/Verbal cueing    Medical Problem List and Plan: 1.  Deficits with mobility, endurance, self-care secondary to thoracic myelopathy/metastatic prostate cancer status post decompression .  Continue CIR PT,OT requires ModA x 2 for amb -decadron  taper to 3mg  q6 h on 7/9, plan on 2mg  q6 on 7/11  2.  Antithrombotics: -DVT/anticoagulation:  Mechanical: Sequential compression devices, below knee Bilateral lower extremities             -antiplatelet therapy: N/A 3. Pain Management: Hydrocodone prn effective.  4. Mood: LCSW to follow for evaluation and support.              -antipsychotic agents: N/A 5. Neuropsych: This patient is capable of making decisions on his own behalf. 6. Skin/Wound Care: Monitor  Incision daily for healing.  7. Fluids/Electrolytes/Nutrition: encourage po 8. HTN: Monitor BP tid--continue Coreg and Norvasc.  Patient Vitals for the past 24 hrs:  BP Temp Temp src Pulse Resp SpO2  03/20/19 0546 138/73 98.1 F (36.7 C) Oral 61 16 100 %  03/19/19 1926 115/68 (!) 97.5 F (36.4 C) Oral 66 17 100 %  03/19/19 1357 122/68 98 F (36.7 C) Oral 69 18 100 %   Relatively controlled on 7/8 9. Neuropathy: Reports improvement in sensation BLE---almost normal in fact.  - Continue Neurontin at bedtime.   Monitor pain with increased mobility. 10. ?neurogenic bowel/bladder: Improving, requires bowel program with Dulcolax.  Occasional catheterizations but mainly using urinal 11.  Steroid-induced hyperglycemia:  Will not make any changes while weaning steroids  -monitor CBG's BID.    -cover with [facility] CBG (last 3)  Recent Labs    03/19/19 1151 03/19/19 1622 03/19/19 2124  GLUCAP 115* 132* 118*    Improving control  7/8- no changes 12. ABLA:  Hemoglobin 8.6 on 7/3  Continue to monitor  -no clinical signs of blood loss 13.  Hypoalbuminemia  Protein supplementation 14.   Transaminitis- may be related to Zocor  LFTs elevated on 7/2  Continue to monitor  LOS: 9 days A FACE TO FACE EVALUATION WAS PERFORMED  Charlett Blake 03/20/2019, 6:56 AM

## 2019-03-20 NOTE — Progress Notes (Signed)
Occupational Therapy Session Note  Patient Details  Name: Dennis Fischer MRN: [id number] Date of Birth: [birth year]  Today's Date: 03/20/2019 OT Individual Time: [phone] OT Individual Time Calculation (min): 56 min   Short Term Goals: Week 2:  OT Short Term Goal 1 (Week 2): STG=LTG due to LOS  Skilled Therapeutic Interventions/Progress Updates:    Pt greeted in bed with no c/o pain. Supine<sit completed with supervision using bedrail. Shoes donned EOB using shoe horn with vcs and increased time. Squat pivot<w/c completed with steady assist, vcs, and bed elevated. Pt wanted to spot wash at the sink and don clean clothes for the day. He was able to state 2/3 of his back precautions. Though pt mentioned "bending" as one of his precautions, he bent forward towards ankles to apply lotion. Vcs required to remind pt that he cannot bend that far. OT assisted him with applying lotion to lower legs. He initiated using reacher to thread LEs into pants. Steady assist for sit<stand, and Mod A for balance while pulling up pants. Noted heavy reliance on sink for balance support. Also noted tipping of w/c during dynamic standing activity due to LE hyperextension. The same standing assist required during handwashing after. Supervision for oral care and shaving completed while sitting. Next, worked on controlled sit<stands and LE strengthening. Had pt complete 5 sit<stands at the sink with controlled descent. Pt with improved technique when slowing pace, however still often exhibited hip sway and needed Min-Mod A to safely return to w/c. Mini squats completed at the sink 10 reps 2 sets. During 2nd set, had pt hold squatted position for 2 second holds. He needed demonstration and step by step cues to do this accurately. Pt reported liking the feeling of mini squats, and initiated 3rd set for 5 reps. Also educated pt on ways to improve sleep hygiene, as he reports this is an ADL area that is causing him trouble. He uses  aromatherapy techniques at home, and provided him with lavender to use via inhalation, per request. Also educated pt on using electronic device to find soothing sleep videos to help him sleep. Provided pt with written instructions, as he exhibited poor carryover of education when just given demonstration. He was very grateful of education. At end of session pt was agreeable to remain up in w/c for 20 minutes for increasing OOB tolerance. NT made aware. Pt was left in w/c with all needs within reach and safety belt fastened.     Therapy Documentation Precautions:  Precautions Precautions: Fall Restrictions Weight Bearing Restrictions: No ADL:       Therapy/Group: Individual Therapy  Alfhild Partch A Viveca Beckstrom 03/20/2019, 3:29 PM

## 2019-03-20 NOTE — Progress Notes (Signed)
Patient is being I/O cathed twice a day--will d/c ditropan and monitor to see if this will help with voiding function.

## 2019-03-21 DIAGNOSIS — R799 Abnormal finding of blood chemistry, unspecified: Secondary | ICD-10-CM

## 2019-03-21 LAB — GLUCOSE, CAPILLARY
Glucose-Capillary: 103 mg/dL — ABNORMAL HIGH (ref 70–99)
Glucose-Capillary: 111 mg/dL — ABNORMAL HIGH (ref 70–99)
Glucose-Capillary: 153 mg/dL — ABNORMAL HIGH (ref 70–99)

## 2019-03-21 NOTE — Plan of Care (Signed)
  Problem: Consults Goal: RH SPINAL CORD INJURY PATIENT EDUCATION Description:  See Patient Education module for education specifics.  Outcome: Progressing Goal: Skin Care Protocol Initiated - if Braden Score 18 or less Description: If consults are not indicated, leave blank or document N/A Outcome: Progressing   Problem: SCI BOWEL ELIMINATION Goal: RH STG MANAGE BOWEL WITH ASSISTANCE Description: STG Manage Bowel with [place]. Outcome: Progressing   Problem: SCI BLADDER ELIMINATION Goal: RH STG MANAGE BLADDER WITH ASSISTANCE Description: STG Manage Bladder With  Min Assistance Outcome: Progressing   Problem: RH SKIN INTEGRITY Goal: RH STG SKIN FREE OF INFECTION/BREAKDOWN Description: Min assist Outcome: Progressing Goal: RH STG MAINTAIN SKIN INTEGRITY WITH ASSISTANCE Description: STG Maintain Skin Integrity With [place]. Outcome: Progressing Goal: RH STG ABLE TO PERFORM INCISION/WOUND CARE W/ASSISTANCE Description: STG Able To Perform Incision/Wound Care With [employer]. Outcome: Progressing   Problem: RH SAFETY Goal: RH STG ADHERE TO SAFETY PRECAUTIONS W/ASSISTANCE/DEVICE Description: STG Adhere to Safety Precautions With  Min Assistance/Device. Outcome: Progressing Goal: RH STG DECREASED RISK OF FALL WITH ASSISTANCE Description: STG Decreased Risk of Fall With [employer]. Outcome: Progressing   Problem: RH PAIN MANAGEMENT Goal: RH STG PAIN MANAGED AT OR BELOW PT'S PAIN GOAL Description: At or below level 4 Outcome: Progressing   Problem: RH KNOWLEDGE DEFICIT SCI Goal: RH STG INCREASE KNOWLEDGE OF SELF CARE AFTER SCI Description: Pt will be able to direct care at discharge independently using handouts and education Outcome: Progressing

## 2019-03-21 NOTE — Progress Notes (Signed)
[facility] PHYSICAL MEDICINE & REHABILITATION PROGRESS NOTE   Subjective/Complaints: Patient seen laying in bed this morning.  He states he slept well overnight.  He denies complaints.  ROS: Denies CP, SOB, N/V/D   Objective:   No results found. No results for input(s): WBC, HGB, HCT, PLT in the last 72 hours. No results for input(s): NA, K, CL, CO2, GLUCOSE, BUN, CREATININE, CALCIUM in the last 72 hours.  Intake/Output Summary (Last 24 hours) at 03/21/2019 1140 Last data filed at 03/21/2019 0739 Gross per 24 hour  Intake 600 ml  Output 1450 ml  Net -850 ml     Physical Exam: Vital Signs Blood pressure 120/76, pulse (!) 58, temperature 97.9 F (36.6 C), resp. rate 14, height 6' (1.829 m), weight 83.6 kg, SpO2 100 %. Constitutional: No distress . Vital signs reviewed. HENT: Normocephalic.  Atraumatic. Eyes: EOMI.  No discharge. Cardiovascular: No JVD. Respiratory: Normal effort. GI: Non-distended. Musc: No edema or tenderness in extremities. Neurological: He is alert Motor: Bilateral upper extremities: 5/5 proximal distal, stable Right lower extremity: Hip flexion, knee extension 4/5, ankle dorsiflexion 4/5, unchanged Left LE: 4-/5 grossly throughout Skin: Skin is warm and dry.  Psychiatric: pleasant   Assessment/Plan: 1. 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  Physiatrist is providing close team supervision and 24 hour management of active medical problems listed below.  Physiatrist and rehab team continue to assess barriers to discharge/monitor patient progress toward functional and medical goals  Care Tool:  Bathing    Body parts bathed by patient: Right arm, Left arm, Chest, Abdomen, Face, Left lower leg, Right lower leg, Left upper leg, Right upper leg, Front perineal area, Buttocks   Body parts bathed by helper: Front  perineal area, Buttocks     Bathing assist Assist Level: Moderate Assistance - Patient 50 - 74%     Upper Body Dressing/Undressing Upper body dressing   What is the patient wearing?: Pull over shirt    Upper body assist Assist Level: Independent    Lower Body Dressing/Undressing Lower body dressing      What is the patient wearing?: Underwear/pull up, Pants     Lower body assist Assist for lower body dressing: Minimal Assistance - Patient > 75%     Toileting Toileting    Toileting assist Assist for toileting: Moderate Assistance - Patient 50 - 74%     Transfers Chair/bed transfer  Transfers assist     Chair/bed transfer assist level: Minimal Assistance - Patient > 75%     Locomotion Ambulation   Ambulation assist   Ambulation activity did not occur: Safety/medical concerns  Assist level: 2 helpers Assistive device: Lite Gait Max distance: 25'   Walk 10 feet activity   Assist  Walk 10 feet activity did not occur: Safety/medical concerns  Assist level: 2 helpers Assistive device: Lite Gait   Walk 50 feet activity   Assist Walk 50 feet with 2 turns activity did not occur: Safety/medical concerns  Assist level: Dependent - Patient 0% Assistive device: Lite Gait    Walk 150 feet activity   Assist Walk 150 feet activity did not occur: Safety/medical concerns         Walk 10 feet on uneven surface  activity   Assist Walk 10 feet on uneven surfaces activity did not occur: Safety/medical concerns         Wheelchair  Assist Will patient use wheelchair at discharge?: Yes Type of Wheelchair: Manual    Wheelchair assist level: Supervision/Verbal cueing Max wheelchair distance: 150'    Wheelchair 50 feet with 2 turns activity    Assist        Assist Level: Supervision/Verbal cueing   Wheelchair 150 feet activity     Assist     Assist Level: Supervision/Verbal cueing    Medical Problem List and Plan: 1.   Deficits with mobility, endurance, self-care secondary to thoracic myelopathy/metastatic prostate cancer status post decompression .  Continue CIR PT,OT requires ModA x 2 for amb -decadron  taper to 3mg  q6 h on 7/9, decreased to 2 mg every 6 hours on 7/11  2.  Antithrombotics: -DVT/anticoagulation:  Mechanical: Sequential compression devices, below knee Bilateral lower extremities             -antiplatelet therapy: N/A 3. Pain Management: Hydrocodone prn effective.  4. Mood: LCSW to follow for evaluation and support.              -antipsychotic agents: N/A 5. Neuropsych: This patient is capable of making decisions on his own behalf. 6. Skin/Wound Care: Monitor  Incision daily for healing.  7. Fluids/Electrolytes/Nutrition: encourage po 8. HTN: Monitor BP tid--continue Coreg and Norvasc.  Patient Vitals for the past 24 hrs:  BP Temp Pulse Resp SpO2 Weight  03/21/19 0324 120/76 97.9 F (36.6 C) (!) 58 14 100 % 83.6 kg  03/20/19 2032 128/77 97.8 F (36.6 C) 65 16 99 % -  03/20/19 1531 122/71 98.5 F (36.9 C) 73 18 100 % -   Controlled on 7/11 9. Neuropathy: Improving  Continue Neurontin at bedtime.     Monitor pain with increased mobility. 10. ?neurogenic bowel/bladder: Improving, requires bowel program with Dulcolax.  Occasional catheterizations but mainly using urinal 11.  Steroid-induced hyperglycemia:  Will not make any changes while weaning steroids  -monitor CBG's BID.    -cover with SSI CBG (last 3)  Recent Labs    03/20/19 1619 03/20/19 2132 03/21/19 0632  GLUCAP 139* 146* 103*    Labile on 7/11 12. ABLA:  Hemoglobin 8.6 on 7/3, labs ordered for Monday  Continue to monitor  -no clinical signs of blood loss 13.  Hypoalbuminemia  Protein supplementation 14.  Transaminitis- may be related to Zocor  LFTs elevated on 7/2  Continue to monitor 15.  Elevated BUN/creatinine ratio  Encourage fluids  Labs ordered for Monday  LOS: 10 days A FACE TO FACE EVALUATION WAS  PERFORMED  Dennis Fischer Lorie Phenix 03/21/2019, 11:40 AM

## 2019-03-22 ENCOUNTER — Inpatient Hospital Stay (HOSPITAL_COMMUNITY): Payer: Medicare Other | Admitting: Physical Therapy

## 2019-03-22 ENCOUNTER — Inpatient Hospital Stay (HOSPITAL_COMMUNITY): Payer: Medicare Other | Admitting: Occupational Therapy

## 2019-03-22 DIAGNOSIS — R799 Abnormal finding of blood chemistry, unspecified: Secondary | ICD-10-CM

## 2019-03-22 LAB — GLUCOSE, CAPILLARY
Glucose-Capillary: 103 mg/dL — ABNORMAL HIGH (ref 70–99)
Glucose-Capillary: 119 mg/dL — ABNORMAL HIGH (ref 70–99)
Glucose-Capillary: 106 mg/dL — ABNORMAL HIGH (ref 70–99)
Glucose-Capillary: 152 mg/dL — ABNORMAL HIGH (ref 70–99)

## 2019-03-22 NOTE — Addendum Note (Signed)
Encounter addended by: Tyler Pita, MD on: 03/22/2019 1:51 PM  Actions taken: Medication List reviewed, Problem List reviewed, Allergies reviewed, Clinical Note Signed

## 2019-03-22 NOTE — Progress Notes (Signed)
[facility] PHYSICAL MEDICINE & REHABILITATION PROGRESS NOTE   Subjective/Complaints: Patient seen working with therapy this morning.  He states he slept well overnight.  He states he feels better.  ROS: Denies CP, SOB, N/V/D   Objective:   No results found. No results for input(s): WBC, HGB, HCT, PLT in the last 72 hours. No results for input(s): NA, K, CL, CO2, GLUCOSE, BUN, CREATININE, CALCIUM in the last 72 hours.  Intake/Output Summary (Last 24 hours) at 03/22/2019 1151 Last data filed at 03/22/2019 0810 Gross per 24 hour  Intake 698 ml  Output 2500 ml  Net -1802 ml     Physical Exam: Vital Signs Blood pressure 126/79, pulse 61, temperature 98.2 F (36.8 C), temperature source Oral, resp. rate 16, height 6' (1.829 m), weight 83.1 kg, SpO2 100 %. Constitutional: No distress . Vital signs reviewed. HENT: Normocephalic.  Atraumatic. Eyes: EOMI.  No discharge. Cardiovascular: No JVD. Respiratory: Normal effort. GI: Non-distended. Musc: No edema or tenderness in extremities. Neurological: He is alert  Motor: Bilateral upper extremities: 5/5 proximal distal, stable Right lower extremity: Hip flexion, knee extension 4/5, ankle dorsiflexion 4/5, stable Left LE: 4-/5 grossly throughout, stable Skin: Skin is warm and dry.  Psychiatric: pleasant   Assessment/Plan: 1. 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  Physiatrist is providing close team supervision and 24 hour management of active medical problems listed below.  Physiatrist and rehab team continue to assess barriers to discharge/monitor patient progress toward functional and medical goals  Care Tool:  Bathing    Body parts bathed by patient: Right arm, Left arm, Chest, Abdomen, Face, Left lower leg, Right lower leg, Left upper leg, Right upper leg, Front perineal area   Body  parts bathed by helper: Buttocks     Bathing assist Assist Level: Minimal Assistance - Patient > 75%     Upper Body Dressing/Undressing Upper body dressing   What is the patient wearing?: Pull over shirt    Upper body assist Assist Level: Set up assist    Lower Body Dressing/Undressing Lower body dressing      What is the patient wearing?: Underwear/pull up, Pants     Lower body assist Assist for lower body dressing: Minimal Assistance - Patient > 75%     Toileting Toileting Toileting Activity did not occur Landscape architect and hygiene only): Refused  Toileting assist Assist for toileting: Moderate Assistance - Patient 50 - 74%     Transfers Chair/bed transfer  Transfers assist     Chair/bed transfer assist level: Minimal Assistance - Patient > 75%     Locomotion Ambulation   Ambulation assist   Ambulation activity did not occur: Safety/medical concerns  Assist level: 2 helpers Assistive device: Lite Gait Max distance: 25'   Walk 10 feet activity   Assist  Walk 10 feet activity did not occur: Safety/medical concerns  Assist level: 2 helpers Assistive device: Lite Gait   Walk 50 feet activity   Assist Walk 50 feet with 2 turns activity did not occur: Safety/medical concerns  Assist level: Dependent - Patient 0% Assistive device: Lite Gait    Walk 150 feet activity   Assist Walk 150 feet activity did not occur: Safety/medical concerns         Walk 10 feet on uneven surface  activity   Assist Walk 10 feet on uneven surfaces activity did not occur: Safety/medical concerns  Wheelchair     Assist Will patient use wheelchair at discharge?: Yes Type of Wheelchair: Manual    Wheelchair assist level: Supervision/Verbal cueing Max wheelchair distance: 150'    Wheelchair 50 feet with 2 turns activity    Assist        Assist Level: Supervision/Verbal cueing   Wheelchair 150 feet activity     Assist      Assist Level: Supervision/Verbal cueing    Medical Problem List and Plan: 1.  Deficits with mobility, endurance, self-care secondary to thoracic myelopathy/metastatic prostate cancer status post decompression .  Continue CIR PT,OT requires ModA x 2 for amb  Decadron  taper to 3mg  q6 h on 7/9, decreased to 2 mg every 6 hours on 7/11, continue to taper 2.  Antithrombotics: -DVT/anticoagulation:  Mechanical: Sequential compression devices, below knee Bilateral lower extremities             -antiplatelet therapy: N/A 3. Pain Management: Hydrocodone prn effective.  4. Mood: LCSW to follow for evaluation and support.              -antipsychotic agents: N/A 5. Neuropsych: This patient is capable of making decisions on his own behalf. 6. Skin/Wound Care: Monitor  Incision daily for healing.  7. Fluids/Electrolytes/Nutrition: encourage po 8. HTN: Monitor BP tid--continue Coreg and Norvasc.  Patient Vitals for the past 24 hrs:  BP Temp Temp src Pulse Resp SpO2 Weight  03/22/19 0442 126/79 98.2 F (36.8 C) Oral 61 16 100 % 83.1 kg  03/21/19 2052 122/73 98 F (36.7 C) Oral (!) 58 18 100 % -  03/21/19 1449 118/74 99.4 F (37.4 C) Oral 66 14 100 % -   Controlled on 7/12 9. Neuropathy: Improving  Continue Neurontin at bedtime.     Monitor pain with increased mobility. 10. ?neurogenic bowel/bladder: Improving, requires bowel program with Dulcolax.  Occasional catheterizations but mainly using urinal 11.  Steroid-induced hyperglycemia:  Will not make any changes while weaning steroids  -monitor CBG's BID.    -cover with SSI CBG (last 3)  Recent Labs    03/21/19 1721 03/21/19 2211 03/22/19 0631  GLUCAP 111* 153* 103*    Labile, but improving on 7/12 12. ABLA:  Hemoglobin 8.6 on 7/3, labs ordered for tomorrow  Continue to monitor  -no clinical signs of blood loss 13.  Hypoalbuminemia  Protein supplementation 14.  Transaminitis- may be related to Zocor  LFTs elevated on  7/2  Continue to monitor 15.  Elevated BUN/creatinine ratio  Encourage fluids  Labs ordered for tomorrow  LOS: 11 days A FACE TO FACE EVALUATION WAS PERFORMED  Dorell Gatlin Lorie Phenix 03/22/2019, 11:51 AM

## 2019-03-22 NOTE — Progress Notes (Signed)
  Radiation Oncology         [phone] ________________________________  Name: Dennis Fischer MRN: [id number]  Date: 03/03/2019  DOB: [birth year]  Radium-223 Infusion Note  Diagnosis:  Castration resistant prostate cancer with painful bone involvement  Current Infusion:    3  Planned Infusions:  6  Narrative: [name] presented to nuclear medicine for treatment. His most recent blood counts were reviewed.  He remains a good candidate to proceed with Ra-223.  The patient was situated in an infusion suite with a contact barrier placed under his arm. Intravenous access was established, using sterile technique, and a normal saline infusion from a syringe was started.  Micro-dosimetry:  The prescribed radiation activity was assayed and confirmed to be within specified tolerance.  Special Treatment Procedure - Infusion:  The nuclear medicine technologist and I personally verified the dose activity to be delivered as specified in the written directive, and verified the patient identification via 2 separate methods.  The syringe containing the dose was attached to an intravenous access and the dose delivered over a minute. No complications were noted.  The total administered dose was 124.3 microcuries.   A saline flush of the line and the syringe that contained the isotope was then performed.  The residual radioactivity in the syringe was 2.74 microcuries, so the actual infused isotope activity was 121.56 microcuries.   Pressure was applied to the venipuncture site, and a compression bandage placed.   Radiation Safety personnel were present to perform the discharge survey, as detailed on their documentation.   After a short period of observation, the patient had his IV removed.  Impression:  The patient tolerated his infusion relatively well.  Plan:  The patient will return in one month for ongoing care.    ________________________________  Sheral Apley. Tammi Klippel, M.D.

## 2019-03-22 NOTE — Progress Notes (Signed)
Physical Therapy Session Note  Patient Details  Name: Dennis Fischer MRN: [id number] Date of Birth: 06-19-1946  Today's Date: 03/22/2019 PT Individual Time: 1105-1202 PT Individual Time Calculation (min): 57 min   Short Term Goals: Week 2:  PT Short Term Goal 1 (Week 2): =LTG due to ELOS  Skilled Therapeutic Interventions/Progress Updates: Pt presented in w.c agreeable to therapy, pt denies pain during session. Pt propelled to rehab gym supervision level using BUE. Performed squat pivot transfer to mat minA as pt attempted to stand pivot and resulted in poor placement of BLE. Pt participated in STS initially from elevated surface lower mat for BLE and static balance upon standing. Pt instructed in decreasing bracing of BLE against mat when standing for increased BLE recruitment, performed x 6 (to fatigue). Performed toe taps to 2in step for wt shifting and coordination x 10 bilaterally. Also performed toe taps to target with emphasis on controlled movement, pt demonstrated controlled placement with RLE>LLE. Pt also participated in seated therex as follows:  AROM LAQ 1# cuff 2 x 15 bilaterally Hamstring pulls level 1 resistance band 2 x 15 bilaterally Hip flexion 1# cuff x 15 bilaterally, AROM x 15 bilaterally Hip ER level 1 resistance band 2 x 15 bilaterally Pt performed squat pivot transfer to return to w/c with CGA and supervision assist for w/c parts management. Pt propelled back to room and performed squat pivot back to bed CGA. Performed sit to supine on flat bed with supervision and was able to reposition self to comfort. Pt left with bed alarm on, call bell within reach and needs met.      Therapy Documentation Precautions:  Precautions Precautions: Fall Restrictions Weight Bearing Restrictions: No General:   Vital Signs: Therapy Vitals Temp: 98.3 F (36.8 C) Temp Source: Oral Pulse Rate: 72 Resp: 17 BP: 125/82 Patient Position (if appropriate): Lying Oxygen Therapy SpO2:  100 % O2 Device: Room Air    Therapy/Group: Individual Therapy  [name]  Dierdra Salameh, PTA  03/22/2019, 3:12 PM

## 2019-03-22 NOTE — Plan of Care (Signed)
  Problem: Consults Goal: RH SPINAL CORD INJURY PATIENT EDUCATION Description:  See Patient Education module for education specifics.  Outcome: Progressing Goal: Skin Care Protocol Initiated - if Braden Score 18 or less Description: If consults are not indicated, leave blank or document N/A Outcome: Progressing   Problem: SCI BOWEL ELIMINATION Goal: RH STG MANAGE BOWEL WITH ASSISTANCE Description: STG Manage Bowel with [place]. Outcome: Progressing   Problem: SCI BLADDER ELIMINATION Goal: RH STG MANAGE BLADDER WITH ASSISTANCE Description: STG Manage Bladder With  Min Assistance Outcome: Progressing   Problem: RH SKIN INTEGRITY Goal: RH STG SKIN FREE OF INFECTION/BREAKDOWN Description: Min assist Outcome: Progressing Goal: RH STG MAINTAIN SKIN INTEGRITY WITH ASSISTANCE Description: STG Maintain Skin Integrity With [place]. Outcome: Progressing Goal: RH STG ABLE TO PERFORM INCISION/WOUND CARE W/ASSISTANCE Description: STG Able To Perform Incision/Wound Care With [employer]. Outcome: Progressing   Problem: RH SAFETY Goal: RH STG ADHERE TO SAFETY PRECAUTIONS W/ASSISTANCE/DEVICE Description: STG Adhere to Safety Precautions With  Min Assistance/Device. Outcome: Progressing Goal: RH STG DECREASED RISK OF FALL WITH ASSISTANCE Description: STG Decreased Risk of Fall With [employer]. Outcome: Progressing   Problem: RH PAIN MANAGEMENT Goal: RH STG PAIN MANAGED AT OR BELOW PT'S PAIN GOAL Description: At or below level 4 Outcome: Progressing   Problem: RH KNOWLEDGE DEFICIT SCI Goal: RH STG INCREASE KNOWLEDGE OF SELF CARE AFTER SCI Description: Pt will be able to direct care at discharge independently using handouts and education Outcome: Progressing

## 2019-03-22 NOTE — Progress Notes (Signed)
Occupational Therapy Session Note  Patient Details  Name: Dennis Fischer MRN: [id number] Date of Birth: 1946-04-04  Today's Date: 03/22/2019 OT Individual Time: 9470-9628 OT Individual Time Calculation (min): 71 min   Short Term Goals: Week 2:  OT Short Term Goal 1 (Week 2): STG=LTG due to LOS  Skilled Therapeutic Interventions/Progress Updates:    Pt greeted in bed with no c/o pain. Ready for OT. Discussed bed height/setup at home. Per pt, bed is approx 31 inches high. CIR bed was elevated to this height with bedrails removed. He was able to complete supine<sit unassisted without breaking back precautions. However, very difficult for him to don shoes using reacher and shoe horn EOB due to feet dangling above floor, even after scooting forward. Tried to find a longer shoe horn in supply, however none available. Discussed having spouse assist him with shoes at d/c for safety when getting OOB. Stand pivot using RW completed with Mod A and vcs to wait for LEs to touch w/c before reaching back to sit. He then used LEs to propel himself over to sink. Bathing/dressing completed sit<stand at sink with focus on standing balance and standing endurance. He completed 7 stands in total, able to remain standing for 1 minute intervals before fatiguing. Had pt engage in one-handed grooming tasks, or components of grooming tasks during this time. Min A for standing balance while B UEs on sink counter, Mod A for balance when one UE was engaged in activity. Vcs required for neutral BOS, as pt exhibited staggered stances. Able to improve but unable to maintain totally neutral base due to ataxia, even with visual feedback. He was able to recall that he can only apply lotion to knees, and that his spouse would need to help him with lower leges. 2/3 recall of back precautions still. Able to use reacher to thread LEs into pull ups and pants. Also able to fully elevate LB garments in standing with balance assist alone.  Introduced LH sponge for washing feet, and also using reacher and towel to dry off feet. Max A for footwear due to time constraints. There is a sock aide in room that he would benefit from using in future OT sessions. At end of session pt wanted to remain in w/c. Left him with all needs within reach, safety belt fastened, and lavender to use via inhalation, per request. Tx focus on sit<stands, standing balance, and adaptive self care skills.    Therapy Documentation Precautions:  Precautions Precautions: Fall Restrictions Weight Bearing Restrictions: No Pain: Pain Assessment Pain Scale: 0-10 Pain Score: 0-No pain ADL:       Therapy/Group: Individual Therapy  Adair Lauderback A Shatara Stanek 03/22/2019, 12:13 PM

## 2019-03-23 ENCOUNTER — Other Ambulatory Visit: Payer: Self-pay | Admitting: Radiation Oncology

## 2019-03-23 ENCOUNTER — Telehealth: Payer: Self-pay | Admitting: *Deleted

## 2019-03-23 ENCOUNTER — Inpatient Hospital Stay (HOSPITAL_COMMUNITY): Payer: Medicare Other | Admitting: Physical Therapy

## 2019-03-23 ENCOUNTER — Inpatient Hospital Stay (HOSPITAL_COMMUNITY): Payer: Medicare Other | Admitting: Occupational Therapy

## 2019-03-23 DIAGNOSIS — C7951 Secondary malignant neoplasm of bone: Secondary | ICD-10-CM

## 2019-03-23 LAB — CBC WITH DIFFERENTIAL/PLATELET
Abs Immature Granulocytes: 0.12 10*3/uL — ABNORMAL HIGH (ref 0.00–0.07)
Basophils Absolute: 0 10*3/uL (ref 0.0–0.1)
Basophils Relative: 0 %
Eosinophils Absolute: 0 10*3/uL (ref 0.0–0.5)
Eosinophils Relative: 0 %
HCT: 29.6 % — ABNORMAL LOW (ref 39.0–52.0)
Hemoglobin: 9.7 g/dL — ABNORMAL LOW (ref 13.0–17.0)
Immature Granulocytes: 1 %
Lymphocytes Relative: 5 %
Lymphs Abs: 0.4 10*3/uL — ABNORMAL LOW (ref 0.7–4.0)
MCH: 30.7 pg (ref 26.0–34.0)
MCHC: 32.8 g/dL (ref 30.0–36.0)
MCV: 93.7 fL (ref 80.0–100.0)
Monocytes Absolute: 0.5 10*3/uL (ref 0.1–1.0)
Monocytes Relative: 6 %
Neutro Abs: 7.8 10*3/uL — ABNORMAL HIGH (ref 1.7–7.7)
Neutrophils Relative %: 88 %
Platelets: 143 10*3/uL — ABNORMAL LOW (ref 150–400)
RBC: 3.16 MIL/uL — ABNORMAL LOW (ref 4.22–5.81)
RDW: 20.7 % — ABNORMAL HIGH (ref 11.5–15.5)
WBC: 8.9 10*3/uL (ref 4.0–10.5)
nRBC: 0.2 % (ref 0.0–0.2)

## 2019-03-23 LAB — BASIC METABOLIC PANEL
Anion gap: 9 (ref 5–15)
BUN: 27 mg/dL — ABNORMAL HIGH (ref 8–23)
CO2: 23 mmol/L (ref 22–32)
Calcium: 7.9 mg/dL — ABNORMAL LOW (ref 8.9–10.3)
Chloride: 104 mmol/L (ref 98–111)
Creatinine, Ser: 0.92 mg/dL (ref 0.61–1.24)
GFR calc Af Amer: >60 mL/min (ref >60)
GFR calc non Af Amer: >60 mL/min (ref >60)
Glucose, Bld: 124 mg/dL — ABNORMAL HIGH (ref 70–99)
Potassium: 4.3 mmol/L (ref 3.5–5.1)
Sodium: 136 mmol/L (ref 135–145)

## 2019-03-23 LAB — GLUCOSE, CAPILLARY
Glucose-Capillary: 118 mg/dL — ABNORMAL HIGH (ref 70–99)
Glucose-Capillary: 208 mg/dL — ABNORMAL HIGH (ref 70–99)

## 2019-03-23 MED ORDER — DEXAMETHASONE 4 MG PO TABS
2.0000 mg | ORAL_TABLET | Freq: Three times a day (TID) | ORAL | Status: DC
  Administered 2019-03-23 – 2019-03-25 (×6): 2 mg via ORAL
  Filled 2019-03-23 (×6): qty 1

## 2019-03-23 MED ORDER — POLYETHYLENE GLYCOL 3350 17 G PO PACK
17.0000 g | PACK | Freq: Every day | ORAL | Status: DC
  Administered 2019-03-24: 17 g via ORAL
  Filled 2019-03-23 (×3): qty 1

## 2019-03-23 NOTE — Progress Notes (Signed)
Physical Therapy Session Note  Patient Details  Name: Saintclair Schroader MRN: [id number] Date of Birth: 1946-01-12  Today's Date: 03/23/2019 PT Individual Time: 1100-1200; 1345-1430 PT Individual Time Calculation (min): 60 min and 45 min  Short Term Goals: Week 2:  PT Short Term Goal 1 (Week 2): =LTG due to ELOS  Skilled Therapeutic Interventions/Progress Updates:    Session 1: Pt received seated in w/c in room, agreeable to PT session. No complaints of pain. Manual w/c propulsion x 150 ft with use of BUE and Supervision. Squat pivot transfer w/c to mat table with min A. Sit to stand x 5 reps from mat table to RW with min A. Pt exhibits poor eccentric control when sitting with RLE extending as he sits. Attempt to have pt perform standing BLE coordination task stepping over target, pt demos poor control of BLE and unable to remain safely standing. Sit to supine Supervision. Supine BLE strengthening therex: bridges 2 x 10 reps. Pt then reports he has had urinary incontinence. Supine to sit Supervision. Squat pivot transfer back to w/c min A. Sit to stand with min A to RW for dependent pericare, brief change, and pants change. Squat pivot transfer back to bed min A. Sit to supine Supervision. Pt left supine in bed with needs in reach at end of session.  Session 2: Pt received seated in bed, agreeable to PT session. No complaints of pain. Bed mobility Supervision. Squat pivot transfer bed to w/c min A. Manual w/c propulsion x 150 ft with use of BUE and Supervision. Squat pivot transfer w/c to/from mat table with min A. Sit to stand with min A to RW. Standing marches x 10 reps each with focus on BLE placement with hip ext. Forward/backward stepping x 1 step with RW and mod A with focus on LE placement. Seated alt L/R cone taps for LE coordination training. Assisted pt back to bed at end of session. Pt left semi-reclined in bed with needs in reach, bed alarm in place.  Therapy Documentation Precautions:   Precautions Precautions: Fall Restrictions Weight Bearing Restrictions: No    Therapy/Group: Individual Therapy   Excell Seltzer, PT, DPT  03/23/2019, 12:25 PM

## 2019-03-23 NOTE — Progress Notes (Addendum)
[facility] PHYSICAL MEDICINE & REHABILITATION PROGRESS NOTE   Subjective/Complaints: No new issues. Happy with progress. Denies pain. Asked about appt at cancer center tomorrow  ROS: Patient denies fever, rash, sore throat, blurred vision, nausea, vomiting, diarrhea, cough, shortness of breath or chest pain, joint or back pain, headache, or mood change.    Objective:   No results found. Recent Labs    03/23/19 0415  WBC 8.9  HGB 9.7*  HCT 29.6*  PLT 143*   Recent Labs    03/23/19 0415  NA 136  K 4.3  CL 104  CO2 23  GLUCOSE 124*  BUN 27*  CREATININE 0.92  CALCIUM 7.9*    Intake/Output Summary (Last 24 hours) at 03/23/2019 0910 Last data filed at 03/23/2019 0554 Gross per 24 hour  Intake 458 ml  Output 2275 ml  Net -1817 ml     Physical Exam: Vital Signs Blood pressure 127/73, pulse 62, temperature 98.2 F (36.8 C), temperature source Oral, resp. rate 17, height 6' (1.829 m), weight 83.4 kg, SpO2 100 %. Constitutional: No distress . Vital signs reviewed. HEENT: EOMI, oral membranes moist Neck: supple Cardiovascular: RRR without murmur. No JVD    Respiratory: CTA Bilaterally without wheezes or rales. Normal effort    GI: BS +, non-tender, non-distended  Musc: No edema or tenderness in extremities. Neurological: He is alert  Motor: Bilateral upper extremities: 5/5 proximal distal, stable Right lower extremity: Hip flexion, knee extension 4/5, ankle dorsiflexion 4/5, exam stable Left LE: 4-/5 grossly throughout, stable Skin: Skin is warm and dry.  Psychiatric: pleasant   Assessment/Plan: 1. 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  Physiatrist is providing close team supervision and 24 hour management of active medical problems listed below.  Physiatrist and rehab team continue to assess barriers to discharge/monitor  patient progress toward functional and medical goals  Care Tool:  Bathing    Body parts bathed by patient: Right arm, Left arm, Chest, Abdomen, Face, Left lower leg, Right lower leg, Left upper leg, Right upper leg, Front perineal area, Buttocks   Body parts bathed by helper: Buttocks     Bathing assist Assist Level: Supervision/Verbal cueing     Upper Body Dressing/Undressing Upper body dressing   What is the patient wearing?: Pull over shirt    Upper body assist Assist Level: Set up assist    Lower Body Dressing/Undressing Lower body dressing      What is the patient wearing?: Underwear/pull up, Pants     Lower body assist Assist for lower body dressing: Minimal Assistance - Patient > 75%     Toileting Toileting Toileting Activity did not occur Landscape architect and hygiene only): Refused  Toileting assist Assist for toileting: Moderate Assistance - Patient 50 - 74%     Transfers Chair/bed transfer  Transfers assist     Chair/bed transfer assist level: Minimal Assistance - Patient > 75%     Locomotion Ambulation   Ambulation assist   Ambulation activity did not occur: Safety/medical concerns  Assist level: 2 helpers Assistive device: Lite Gait Max distance: 25'   Walk 10 feet activity   Assist  Walk 10 feet activity did not occur: Safety/medical concerns  Assist level: 2 helpers Assistive device: Lite Gait   Walk 50 feet activity   Assist Walk 50 feet with 2 turns activity did not occur: Safety/medical concerns  Assist level: Dependent -  Patient 0% Assistive device: Lite Gait    Walk 150 feet activity   Assist Walk 150 feet activity did not occur: Safety/medical concerns         Walk 10 feet on uneven surface  activity   Assist Walk 10 feet on uneven surfaces activity did not occur: Safety/medical concerns         Wheelchair     Assist Will patient use wheelchair at discharge?: Yes Type of Wheelchair: Manual     Wheelchair assist level: Supervision/Verbal cueing Max wheelchair distance: 150'    Wheelchair 50 feet with 2 turns activity    Assist        Assist Level: Supervision/Verbal cueing   Wheelchair 150 feet activity     Assist     Assist Level: Supervision/Verbal cueing    Medical Problem List and Plan: 1.  Deficits with mobility, endurance, self-care secondary to thoracic myelopathy/metastatic prostate cancer status post decompression .  Continue CIR PT,OT   Decadron  taper to 3mg  q6 h on 7/9, decreased to 2 mg every 6 hours on 7/11, decrease to 2mg  q8 hours today  -pt for CT simulation tomorrow at cancer center 12:00 2.  Antithrombotics: -DVT/anticoagulation:  Mechanical: Sequential compression devices, below knee Bilateral lower extremities             -antiplatelet therapy: N/A 3. Pain Management: Hydrocodone prn effective.  4. Mood: LCSW to follow for evaluation and support.              -antipsychotic agents: N/A 5. Neuropsych: This patient is capable of making decisions on his own behalf. 6. Skin/Wound Care: Monitor  Incision daily for healing.  7. Fluids/Electrolytes/Nutrition: encourage po 8. HTN: Monitor BP tid--continue Coreg and Norvasc.  Patient Vitals for the past 24 hrs:  BP Temp Temp src Pulse Resp SpO2 Weight  03/23/19 0556 127/73 98.2 F (36.8 C) Oral 62 17 100 % 83.4 kg  03/22/19 1936 119/69 98.7 F (37.1 C) Oral 68 18 99 % -  03/22/19 1457 125/82 98.3 F (36.8 C) Oral 72 17 100 % -   Controlled on 7/13 9. Neuropathy: Improving  Continue Neurontin at bedtime.     Monitor pain with increased mobility. 10. ?neurogenic bowel/bladder:    -no bm since 7/10  -augment regimen today    Still requiring Occasional catheterizations but mainly using urinal 11.  Steroid-induced hyperglycemia:  Will not make any changes while weaning steroids  -monitor CBG's BID.    -cover with SSI CBG (last 3)  Recent Labs    03/22/19 1725 03/22/19 2117  03/23/19 0601  GLUCAP 106* 152* 118*    Sugars improving 7/13 12. ABLA:  Hemoglobin up to 9.7 today 13.  Hypoalbuminemia  Protein supplementation 14.  Transaminitis- may be related to Zocor  LFTs elevated on 7/2  Continue to monitor 15.  Elevated BUN/creatinine ratio  BUN continues to drift up  -push fluids  -recheck wednesday  LOS: 12 days A FACE TO FACE EVALUATION WAS PERFORMED  Meredith Staggers 03/23/2019, 9:10 AM

## 2019-03-23 NOTE — Progress Notes (Signed)
Occupational Therapy Session Note  Patient Details  Name: Dennis Fischer MRN: [id number] Date of Birth: 06/29/1946  Today's Date: 03/23/2019 OT Individual Time: 8315-1761 OT Individual Time Calculation (min): 75 min    Short Term Goals: Week 2:  OT Short Term Goal 1 (Week 2): STG=LTG due to LOS  Skilled Therapeutic Interventions/Progress Updates:    Pt seen for OT ADL bathing/dressing session. Pt awake in supine upon arrival, ready for tx session and denying pain. Discussed d/c planning and need to have wife come in for hands on family training prior to d/c home at end of the week. Discussed with pt recommendation for w/c level at home, sit<>stand for clothing management only. He voiced understanding, however, several minutes later mentioned ambulating into bathroom at home. Pt cont to demonstrate poor insight into deficits and question pt's understanding of recommendation.  Pt's bedding noted to be soiled from incontinent episode, however, event after removing soiled brief, pt cont to deny he had had incontinent episode.  He transferred to sitting EOB with supervision using hospital bed functions. Completed stand pivot transfer with RW (recommending squat pivot at home) with max A to stand from elevated EOB and assist for controlled descent onto [facility]. He bathed seated on BSC with VCs to initiate use of AE. Reacher used to doff/don LB clothing and LH sponge to wash LEs. VCs required for seqnencing this session with pt demonstrating some sequencing deficits during bathing/dressing process. UB bathing/dressing completed with set-up assist, LB bathing completed with supervision/VCs  Using lateral leans on BSC to wash periarea/buttock. He threaded underwear and pants on using reacher. He was unsuccessuful in lateral leans to pull pants up from seated position, therefore stood with RW and mod A to obtain balance. Once pt had balance he was able to alternate UE support on RW in order to pull pants up with  min-mod A for dynamic standing balance.  Completed mod A stand pivot transfer to w/c, cont to demonstrate B LE ataxia and poor proprioception and therefore requiring increased assist for transfer.   He completed grooming tasks from w/c level mod I at sink. Pt left sitting in w/c at sink at end of session, call bell at bedside, RN made aware of pt's position.   Therapy Documentation Precautions:  Precautions Precautions: Fall Restrictions Weight Bearing Restrictions: No Pain:   No/denies pain   Therapy/Group: Individual Therapy  Lunell Robart L 03/23/2019, 7:00 AM

## 2019-03-23 NOTE — Telephone Encounter (Signed)
CALLED PATIENT TO REMIND OF LAB AND WEIGHT FOR 03-24-19, LVM FOR A RETURN CALL

## 2019-03-23 NOTE — Progress Notes (Signed)
Physical Therapy Session Note  Patient Details  Name: Dennis Fischer MRN: [id number] Date of Birth: 1945-10-02  Today's Date: 03/23/2019 PT Individual Time: 0930-1000 PT Individual Time Calculation (min): 30 min   Short Term Goals: Week 2:  PT Short Term Goal 1 (Week 2): =LTG due to ELOS  Skilled Therapeutic Interventions/Progress Updates:    pt performs sit <> stand with mod A with cuing for LE placement, max A for eccentric control for stand to sit.  Tap ups to 6'' step with focus on controlling LE placement with min/mod facilitation.  Side stepping for hip strength and control with mod/max A for balance due to impaired coordination.  Seated LAQ with adduction squeeze with some improvement in LE coordination. Pt performs w/c throughout unit with supervision. Pt left in w/c with all needs at hand.  Therapy Documentation Precautions:  Precautions Precautions: Fall Restrictions Weight Bearing Restrictions: No Pain:  no c/o pain   Therapy/Group: Individual Therapy  Tylin Stradley 03/23/2019, 10:06 AM

## 2019-03-24 ENCOUNTER — Inpatient Hospital Stay (HOSPITAL_COMMUNITY): Payer: Medicare Other | Admitting: Occupational Therapy

## 2019-03-24 ENCOUNTER — Inpatient Hospital Stay (HOSPITAL_COMMUNITY): Payer: Medicare Other | Admitting: Physical Therapy

## 2019-03-24 ENCOUNTER — Ambulatory Visit
Admission: RE | Admit: 2019-03-24 | Discharge: 2019-03-24 | Disposition: A | Discharge status: Home / Self Care | Payer: Medicare Other | Source: Ambulatory Visit | Attending: Radiation Oncology | Admitting: Radiation Oncology

## 2019-03-24 DIAGNOSIS — C61 Malignant neoplasm of prostate: Secondary | ICD-10-CM

## 2019-03-24 DIAGNOSIS — Z7952 Long term (current) use of systemic steroids: Secondary | ICD-10-CM | POA: Insufficient documentation

## 2019-03-24 DIAGNOSIS — Z8042 Family history of malignant neoplasm of prostate: Secondary | ICD-10-CM | POA: Insufficient documentation

## 2019-03-24 DIAGNOSIS — Z91010 Allergy to peanuts: Secondary | ICD-10-CM | POA: Insufficient documentation

## 2019-03-24 DIAGNOSIS — I10 Essential (primary) hypertension: Secondary | ICD-10-CM | POA: Diagnosis not present

## 2019-03-24 DIAGNOSIS — Z87891 Personal history of nicotine dependence: Secondary | ICD-10-CM | POA: Insufficient documentation

## 2019-03-24 DIAGNOSIS — C7952 Secondary malignant neoplasm of bone marrow: Secondary | ICD-10-CM | POA: Diagnosis not present

## 2019-03-24 DIAGNOSIS — Z7982 Long term (current) use of aspirin: Secondary | ICD-10-CM | POA: Insufficient documentation

## 2019-03-24 DIAGNOSIS — C7951 Secondary malignant neoplasm of bone: Secondary | ICD-10-CM | POA: Diagnosis not present

## 2019-03-24 DIAGNOSIS — Z888 Allergy status to other drugs, medicaments and biological substances status: Secondary | ICD-10-CM | POA: Insufficient documentation

## 2019-03-24 DIAGNOSIS — Z79899 Other long term (current) drug therapy: Secondary | ICD-10-CM | POA: Insufficient documentation

## 2019-03-24 LAB — CBC WITH DIFFERENTIAL (CANCER CENTER ONLY)
Abs Immature Granulocytes: 0.07 10*3/uL (ref 0.00–0.07)
Basophils Absolute: 0 10*3/uL (ref 0.0–0.1)
Basophils Relative: 0 %
Eosinophils Absolute: 0 10*3/uL (ref 0.0–0.5)
Eosinophils Relative: 0 %
HCT: 34.7 % — ABNORMAL LOW (ref 39.0–52.0)
Hemoglobin: 11.5 g/dL — ABNORMAL LOW (ref 13.0–17.0)
Immature Granulocytes: 1 %
Lymphocytes Relative: 4 %
Lymphs Abs: 0.4 10*3/uL — ABNORMAL LOW (ref 0.7–4.0)
MCH: 30.7 pg (ref 26.0–34.0)
MCHC: 33.1 g/dL (ref 30.0–36.0)
MCV: 92.8 fL (ref 80.0–100.0)
Monocytes Absolute: 0.5 10*3/uL (ref 0.1–1.0)
Monocytes Relative: 5 %
Neutro Abs: 8.6 10*3/uL — ABNORMAL HIGH (ref 1.7–7.7)
Neutrophils Relative %: 90 %
Platelet Count: 167 10*3/uL (ref 150–400)
RBC: 3.74 MIL/uL — ABNORMAL LOW (ref 4.22–5.81)
RDW: 20.8 % — ABNORMAL HIGH (ref 11.5–15.5)
WBC Count: 9.5 10*3/uL (ref 4.0–10.5)
nRBC: 0 % (ref 0.0–0.2)

## 2019-03-24 LAB — GLUCOSE, CAPILLARY
Glucose-Capillary: 115 mg/dL — ABNORMAL HIGH (ref 70–99)
Glucose-Capillary: 128 mg/dL — ABNORMAL HIGH (ref 70–99)

## 2019-03-24 NOTE — Progress Notes (Signed)
Physical Therapy Session Note  Patient Details  Name: Dennis Fischer MRN: [id number] Date of Birth: 1946/01/06  Today's Date: 03/24/2019 PT Amount of Missed Time (min): 60 Minutes PT Missed Treatment Reason: Unavailable (Comment)(at [facility] for appointment)  Attempted to see patient for PM session, pt still at [facility] for radiation appointment. Pt missed 60 min of scheduled skilled therapy services. Will follow up per POC.   Excell Seltzer, PT, DPT  03/24/2019, 3:35 PM

## 2019-03-24 NOTE — Progress Notes (Signed)
Occupational Therapy Session Note  Patient Details  Name: Dennis Fischer MRN: [id number] Date of Birth: [birth year]  Today's Date: 03/24/2019 OT Individual Time: 7741-4239 OT Individual Time Calculation (min): 72 min    Short Term Goals: Week 2:  OT Short Term Goal 1 (Week 2): STG=LTG due to LOS  Skilled Therapeutic Interventions/Progress Updates:    Pt seen for OT ADL bathing/dressing session. Pt awake in supine upon arrival, agreeable to tx session and denying pain.  He transferred to sitting EOB with supervision using hospital bed functions. Discussed bathing/dressing routine at home with plan to complete on drop arm BSC. Completed min-mod squat pivot transfer to drop arm BSC. He completed UB bathing/dressing with set-up. LB completed with supervision overall, however, muti-modal cuing required for lateral leans and problem solving how to complete pericare/buttock hygiene on BSC. Pt able to complete push up on [facility] while therapist threaded brief. He threaded B LEs into pants using reacher and donned socks with sock aid with set-up and shoes using LH shoe horn. Pt unable to tell that shoes were not on all the way around back of heal. He was able to independently recall techniques to use all AE.  He stood with min A to RW with VCs for awareness to foot placement and pt pulled up pants with steadying assist. He completed min A squat pivot transfer from BSC>w/c. Grooming tasks completed from w/c level at sink mod I.  Completed x10 sit>stand at sink, min A overall to power into standing with heavy reliance on UEs pulling up on sink. Focused on controlled descent back into sitting, reaching back with UEs. Initially mod-max A for controlled sit progressing to min-mod with VCs for technique. Pt left seated in w/c at end of session, all needs in reach and CSW entering as therapist exited.   Therapy Documentation Precautions:  Precautions Precautions: Fall Restrictions Weight Bearing Restrictions:  No   Therapy/Group: Individual Therapy  Danniell Rotundo L 03/24/2019, 6:58 AM

## 2019-03-24 NOTE — Plan of Care (Signed)
D/c gait goal due to slow progress and decreased safety with gait training

## 2019-03-24 NOTE — Patient Care Conference (Signed)
Inpatient RehabilitationTeam Conference and Plan of Care Update Date: 03/24/2019   Time:  2:50 PM   Patient Name: Dennis Fischer      Medical Record Number: [id number]  Date of Birth: 29-Aug-1946 Sex: Male         Room/Bed: 4M09C/4M09C-01 Payor Info: Payor: MEDICARE / Plan: MEDICARE PART A AND B / Product Type: *No Product type* /    Admitting Diagnosis: 1. SCI Team  Other NTSC dysf, secondary malignant neoplasm of bone 15-17days  Admit Date/Time:  03/11/2019  6:54 PM Admission Comments: No comment available   Primary Diagnosis:  <principal problem not specified> Principal Problem: <principal problem not specified>  Patient Active Problem List   Diagnosis Date Noted  . Elevated BUN   . Transaminitis   . Hypoalbuminemia due to protein-calorie malnutrition ([place])   . Acute blood loss anemia   . Steroid-induced hyperglycemia   . Essential hypertension   . Labile blood pressure   . Metastatic cancer to spine ([place]) 03/11/2019  . Neuropathic pain   . Benign essential HTN   . Cocaine use 03/10/2019  . Cord compression myelopathy ([place])   . Spinal cord compression ([place]) 03/09/2019  . Prostate cancer metastatic to bone ([place]) 12/22/2018  . Port-A-Cath in place 07/02/2018  . Goals of care, counseling/discussion 01/20/2018  . Right knee pain 01/17/2018  . Prostate cancer ([place]) 01/22/2017  . Prediabetes 03/30/2016  . Positive FIT (fecal immunochemical test) 11/22/2015  . GERD (gastroesophageal reflux disease) 12/21/2014  . Age-related nuclear cataract of both eyes 01/13/2013  . Hypertensive retinopathy 01/13/2013  . Essential (primary) hypertension 11/25/2012  . Pure hypercholesterolemia 11/25/2012  . History of total hip replacement 08/28/2010  . Tobacco dependence syndrome 03/22/2006    Expected Discharge Date: Expected Discharge Date: 03/27/19  Team Members Present: Physician leading conference: Dr. Alger Simons Social Worker Present: Lennart Pall, LCSW Nurse Present: Judee Clara, LPN PT Present: Excell Seltzer, PT OT Present: Amy Rounds, OT SLP Present: Weston Anna, SLP PPS Coordinator present : Gunnar Fusi, SLP     Current Status/Progress Goal Weekly Team Focus  Medical   CT simulation today. emptying bladder, mostly continent, no caths recently. bowels moving, steroid taper  stabilize medically, establish follow up plan  see above   Bowel/Bladder   continent of bowel/bladder with periodic I&O cath; LBM 03/23/19  Able to have normal bowel/bladder pattern with Mod I  Timed toileting q 2hrs when awake   Swallow/Nutrition/ Hydration             ADL's   CGA squat pivot transfers, min A LB dressing using AE, supervision- CGA LB bathing; set-up UB bathing/dresinng and grooming, min-mod A sit>stand  Mod I w/c level tasks, CGA standing balance and toileting tasks.  ADL re-training from w/c level, functional transfers, family education and d/c planning   Mobility   Supervision bed mobility, min A squat pivot transfer, Supervision w/c mobility, mod A gait x 2' RW or with LiteGait  mod I at w/c level, min A transfers and short distance gait  family edu, transfers, safety awareness   Communication             Safety/Cognition/ Behavioral Observations            Pain   No compalin os pain  <2  Assess and treat pain q shift and as needed   Skin   Surgical incision to his back  maintain skin integrity with supervision  Assess skin q shift and as needed      *  See Care Plan and progress notes for long and short-term goals.     Barriers to Discharge  Current Status/Progress Possible Resolutions Date Resolved   Physician    Medical stability        see medical progress notes      Nursing                  PT  Home environment access/layout;Pending chemo/radiation                 OT                  SLP                SW                Discharge Planning/Teaching Needs:  Pt to d/c home with girlfriend who can provide 24/7 assistance.  To  schedule this week.   Team Discussion:  Medically doing well;  Watching labs;  CT sim today. No skin issues.  Slow and steady progress but may need to extend LOS.  Sit to stand ONLY to pull up pants.  Min assist with tfs and goals for CGA.  Very ataxic.  Recommend he remain w/c level.  Noting possible cognitive deficits with higher level tasks.  Need gf in for education.  Revisions to Treatment Plan:  NA    Continued Need for Acute Rehabilitation Level of Care: The patient requires daily medical management by a physician with specialized training in physical medicine and rehabilitation for the following conditions: Daily direction of a multidisciplinary physical rehabilitation program to ensure safe treatment while eliciting the highest outcome that is of practical value to the patient.: Yes Daily medical management of patient stability for increased activity during participation in an intensive rehabilitation regime.: Yes Daily analysis of laboratory values and/or radiology reports with any subsequent need for medication adjustment of medical intervention for : Neurological problems;Post surgical problems;Urological problems   I attest that I was present, lead the team conference, and concur with the assessment and plan of the team.   Lennart Pall 03/26/2019, 11:18 AM     Team conference was held via web/ teleconference due to [place] - 19

## 2019-03-24 NOTE — Progress Notes (Signed)
  Radiation Oncology         [phone] ________________________________  Name: Vestal Markin MRN: [id number]  Date: 03/24/2019  DOB: February 10, 1946  SIMULATION AND TREATMENT PLANNING NOTE    ICD-10-CM   1. Metastatic cancer to spine [facility])  C79.51     DIAGNOSIS:  73 y.o. patient s/p laminectomy for thoracic spinal metastasis from prostate cancer  NARRATIVE:  The patient was brought to the [facility].  Identity was confirmed.  All relevant records and images related to the planned course of therapy were reviewed.  The patient freely provided informed written consent to proceed with treatment after reviewing the details related to the planned course of therapy. The consent form was witnessed and verified by the simulation staff.  Then, the patient was set-up in a stable reproducible  supine position for radiation therapy.  CT images were obtained.  Surface markings were placed.  The CT images were loaded into the planning software.  Then the target and avoidance structures were contoured including lungs.  Treatment planning then occurred.  The radiation prescription was entered and confirmed.  Then, I designed and supervised the construction of a total of 3 medically necessary complex treatment devices with VacLoc positioner and 2 MLCs to shield lungs/heart.  I have requested : 3D Simulation  I have requested a DVH of the following structures: Left lung, Right lung and target.  PLAN:  The patient will receive 30 Gy in 10 fractions.  ________________________________  Sheral Apley Tammi Klippel, M.D.

## 2019-03-24 NOTE — Progress Notes (Signed)
[facility] PHYSICAL MEDICINE & REHABILITATION PROGRESS NOTE   Subjective/Complaints: No complaints. Had a reasonable night. Up with OT when I arrived  ROS: Patient denies fever, rash, sore throat, blurred vision, nausea, vomiting, diarrhea, cough, shortness of breath or chest pain,   headache, or mood change.   Objective:   No results found. Recent Labs    03/23/19 0415  WBC 8.9  HGB 9.7*  HCT 29.6*  PLT 143*   Recent Labs    03/23/19 0415  NA 136  K 4.3  CL 104  CO2 23  GLUCOSE 124*  BUN 27*  CREATININE 0.92  CALCIUM 7.9*    Intake/Output Summary (Last 24 hours) at 03/24/2019 0834 Last data filed at 03/24/2019 0800 Gross per 24 hour  Intake 580 ml  Output 2700 ml  Net -2120 ml     Physical Exam: Vital Signs Blood pressure 125/79, pulse 62, temperature 97.9 F (36.6 C), temperature source Oral, resp. rate 18, height 6' (1.829 m), weight 86.3 kg, SpO2 100 %. Constitutional: No distress . Vital signs reviewed. HEENT: EOMI, oral membranes moist Neck: supple Cardiovascular: RRR without murmur. No JVD    Respiratory: CTA Bilaterally without wheezes or rales. Normal effort    GI: BS +, non-tender, non-distended     Musc: No edema or tenderness in extremities. Neurological: He is alert  Motor: Bilateral upper extremities: 5/5 proximal distal, stable Right lower extremity: Hip flexion, knee extension 4/5, ankle dorsiflexion 4/5, exam stable Left LE: 4-/5 prox to distal, decreased proprioception Skin: Skin is warm and dry.  Psychiatric: pleasant   Assessment/Plan: 1. 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  Physiatrist is providing close team supervision and 24 hour management of active medical problems listed below.  Physiatrist and rehab team continue to assess barriers to discharge/monitor patient progress toward  functional and medical goals  Care Tool:  Bathing    Body parts bathed by patient: Right arm, Left arm, Chest, Abdomen, Face, Left lower leg, Right lower leg, Left upper leg, Right upper leg, Front perineal area, Buttocks   Body parts bathed by helper: Buttocks     Bathing assist Assist Level: Contact Guard/Touching assist     Upper Body Dressing/Undressing Upper body dressing   What is the patient wearing?: Pull over shirt    Upper body assist Assist Level: Set up assist    Lower Body Dressing/Undressing Lower body dressing      What is the patient wearing?: Pants, Incontinence brief     Lower body assist Assist for lower body dressing: Minimal Assistance - Patient > 75%     Toileting Toileting Toileting Activity did not occur (Clothing management and hygiene only): Refused  Toileting assist Assist for toileting: Moderate Assistance - Patient 50 - 74%     Transfers Chair/bed transfer  Transfers assist     Chair/bed transfer assist level: Maximal Assistance - Patient 25 - 49%     Locomotion Ambulation   Ambulation assist   Ambulation activity did not occur: Safety/medical concerns  Assist level: 2 helpers Assistive device: Lite Gait Max distance: 25'   Walk 10 feet activity   Assist  Walk 10 feet activity did not occur: Safety/medical concerns  Assist level: 2 helpers Assistive device: Lite Gait   Walk 50 feet activity   Assist Walk 50 feet with 2 turns activity did not occur: Safety/medical concerns  Assist level: Dependent -  Patient 0% Assistive device: Lite Gait    Walk 150 feet activity   Assist Walk 150 feet activity did not occur: Safety/medical concerns         Walk 10 feet on uneven surface  activity   Assist Walk 10 feet on uneven surfaces activity did not occur: Safety/medical concerns         Wheelchair     Assist Will patient use wheelchair at discharge?: Yes Type of Wheelchair: Manual    Wheelchair  assist level: Supervision/Verbal cueing Max wheelchair distance: 150'    Wheelchair 50 feet with 2 turns activity    Assist        Assist Level: Supervision/Verbal cueing   Wheelchair 150 feet activity     Assist     Assist Level: Supervision/Verbal cueing    Medical Problem List and Plan: 1.  Deficits with mobility, endurance, self-care secondary to thoracic myelopathy/metastatic prostate cancer status post decompression .  Continue CIR PT,OT, team conf today   Decadron  taper to 3mg  q6 h on 7/9, decreased to 2 mg every 6 hours on 7/11, decreased to 2mg  q8 hours 7/13  -pt for CT simulation  at cancer center 12:00 2.  Antithrombotics: -DVT/anticoagulation:  Mechanical: Sequential compression devices, below knee Bilateral lower extremities             -antiplatelet therapy: N/A 3. Pain Management: Hydrocodone prn effective.  4. Mood: LCSW to follow for evaluation and support.              -antipsychotic agents: N/A 5. Neuropsych: This patient is capable of making decisions on his own behalf. 6. Skin/Wound Care: Monitor  Incision daily for healing.  7. Fluids/Electrolytes/Nutrition: encourage po 8. HTN: Monitor BP tid--continue Coreg and Norvasc.  Patient Vitals for the past 24 hrs:  BP Temp Temp src Pulse Resp SpO2 Weight  03/24/19 0536 125/79 97.9 F (36.6 C) Oral 62 18 100 % -  03/24/19 0536 - - - - - - 86.3 kg  03/23/19 1946 140/72 98.3 F (36.8 C) Oral 73 18 100 % -  03/23/19 1457 119/80 98 F (36.7 C) Oral 65 19 100 % -   Controlled on 7/14 9. Neuropathy: Improving  Continue Neurontin at bedtime.     Monitor pain with increased mobility. 10. ?neurogenic bowel/bladder:    -moved bowels 7/13  -augmented regimen      No cahts since weekend 11.  Steroid-induced hyperglycemia:  Will not make any changes while weaning steroids  -monitor CBG's BID.    -cover with SSI CBG (last 3)  Recent Labs    03/23/19 0601 03/23/19 1837 03/24/19 0625  GLUCAP 118*  208* 115*    Sugars still inconsistent. Weaning steroids 12. ABLA:  Hemoglobin up to 9.7  13.  Hypoalbuminemia  Protein supplementation 14.  Transaminitis- may be related to Zocor  LFTs elevated on 7/2  Continue to monitor 15.  Elevated BUN/creatinine ratio  BUN continues to drift up  -pushing fluids  -recheck wednesday  LOS: 13 days A FACE TO [place] 03/24/2019, 8:34 AM

## 2019-03-24 NOTE — Progress Notes (Signed)
Physical Therapy Session Note  Patient Details  Name: Dennis Fischer MRN: [id number] Date of Birth: July 02, 1946  Today's Date: 03/24/2019 PT Individual Time: 0900-1015 PT Individual Time Calculation (min): 75 min   Short Term Goals: Week 2:  PT Short Term Goal 1 (Week 2): =LTG due to ELOS  Skilled Therapeutic Interventions/Progress Updates:    Session 1: Pt received seated in w/c in room, agreeable to PT session. No complaints of pain. Manual w/c propulsion x 200 ft with use of BUE and Supervision. Pt is Supervision for management of w/c parts. Per pt his car seat height is 30" or higher. Demonstrated how to perform car transfer via stand pivot and use of RW due to significant height difference between w/c seat and car seat height. Pt is mod A for SPT w/c to/from car with max cueing for safety due to inability to safely place BLE due to decreased proprioception and coordination. Pt demos poor insight into safety of car transfer and thinks that transfer "went well" despite near fall when returning to his w/c due to BLE becoming crossed. Pt becomes argumentative about how he can perform car transfers and all other mobility at home safely because his environment is different. Education with patient that we will simulate car transfer with his actual car prior to d/c but that if transfers are not safe during therapy sessions it is not likely they would also be safe at home. Pt is not receptive to education and remains adamant that he will be fine at home. Nustep level 4 x 10 min with use of BLE for global strengthening. Provided handout for how to bump w/c up stairs to review with patient and his family prior to d/c. Per pt his apartment complex is building a ramp from the sidewalk to his building but he will still need to bump up curb in w/c from parking lot to the sidewalk. Squat pivot transfer w/c to/from elevated mat to simulate pt's bed height at home with min A, cueing for safety with transfer. Squat  pivot transfer back to bed with min A. Sit to supine Supervision. Pt left semi-reclined in bed with needs in reach, bed alarm in place.   Therapy Documentation Precautions:  Precautions Precautions: Fall Restrictions Weight Bearing Restrictions: No    Therapy/Group: Individual Therapy   Excell Seltzer, PT, DPT  03/24/2019, 10:27 AM

## 2019-03-25 ENCOUNTER — Inpatient Hospital Stay (HOSPITAL_COMMUNITY): Payer: Medicare Other | Admitting: Occupational Therapy

## 2019-03-25 ENCOUNTER — Inpatient Hospital Stay (HOSPITAL_COMMUNITY): Payer: Medicare Other | Admitting: Physical Therapy

## 2019-03-25 LAB — HEPATIC FUNCTION PANEL
ALT: 40 U/L (ref 0–44)
AST: 18 U/L (ref 15–41)
Albumin: 2.7 g/dL — ABNORMAL LOW (ref 3.5–5.0)
Alkaline Phosphatase: 79 U/L (ref 38–126)
Bilirubin, Direct: 0.1 mg/dL (ref 0.0–0.2)
Indirect Bilirubin: 0.3 mg/dL (ref 0.3–0.9)
Total Bilirubin: 0.4 mg/dL (ref 0.3–1.2)
Total Protein: 5.2 g/dL — ABNORMAL LOW (ref 6.5–8.1)

## 2019-03-25 LAB — GLUCOSE, CAPILLARY
Glucose-Capillary: 131 mg/dL — ABNORMAL HIGH (ref 70–99)
Glucose-Capillary: 137 mg/dL — ABNORMAL HIGH (ref 70–99)
Glucose-Capillary: 137 mg/dL — ABNORMAL HIGH (ref 70–99)

## 2019-03-25 LAB — BASIC METABOLIC PANEL WITH GFR
Anion gap: 10 (ref 5–15)
BUN: 25 mg/dL — ABNORMAL HIGH (ref 8–23)
CO2: 21 mmol/L — ABNORMAL LOW (ref 22–32)
Calcium: 8.1 mg/dL — ABNORMAL LOW (ref 8.9–10.3)
Chloride: 105 mmol/L (ref 98–111)
Creatinine, Ser: 0.9 mg/dL (ref 0.61–1.24)
GFR calc Af Amer: >60 mL/min
GFR calc non Af Amer: >60 mL/min
Glucose, Bld: 139 mg/dL — ABNORMAL HIGH (ref 70–99)
Potassium: 4.4 mmol/L (ref 3.5–5.1)
Sodium: 136 mmol/L (ref 135–145)

## 2019-03-25 MED ORDER — DEXAMETHASONE 4 MG PO TABS
2.0000 mg | ORAL_TABLET | Freq: Two times a day (BID) | ORAL | Status: DC
  Administered 2019-03-25 – 2019-03-27 (×4): 2 mg via ORAL
  Filled 2019-03-25 (×4): qty 1

## 2019-03-25 NOTE — Progress Notes (Signed)
Occupational Therapy Session Note  Patient Details  Name: Dennis Fischer MRN: [id number] Date of Birth: [birth year]  Today's Date: 03/25/2019 OT Individual Time: 9326-7124 and 1415-1500 OT Individual Time Calculation (min): 70 min and 45 min   Short Term Goals: Week 2:  OT Short Term Goal 1 (Week 2): STG=LTG due to LOS  Skilled Therapeutic Interventions/Progress Updates:    Session One: PT seen for OT ADL bathing/dressing session. Pt awake in supine upon arrival, ready to begin tx session and denying pain.  Bed lowered to completely flat position to simulate home set-up, he transferred to sitting EOB with supervision. Attempted to set bed height to what pt reports is his bed height at home, however, pt had bed >33ft off the floor and determined it was unsafe to attempt squat pivot transfer from that height. Lowered bed and completed min-mod A squat pivot transfer to drop arm BSC.  He compelted bathing/dressing routine from [facility], independently initiated use of AE with VCs for problem solving and technique for lateral leans onto buttock and effective use of reacher to thread on underwear/pants with min A. SOcks donned using sock aid with set-up. Cont to require min VCs for sequencing of task. Attempted x4 to complete sit>Stand to RW from [facility], however, pt unable to extend LEs in order to come into full stand. Verbal discussion/problem solving regarding modified methods to pull pants up with emphasis on safety and energy conservation. Ultimately, pt completed push up on [facility] while therapist pulled up underwear/pants total A. Education provided regarding having a plan for "good days and bad days" depending on energy levels, schedule of day etc. As pt with multiple upcoming chemo appointments upon d/c.  He completed min A squat pivot transfer to w/c. Grooming tasks completed from w/c level at sink mod I. Completed x4 sit>stand at sink ledge, min A- CGA to power into standing with heavy reliance on UEs.  Focusing on controlled descent, completed with min A demonstrating improved decentric control.  Pt left seated in w/c at end of session, all needs in reach.   Session Two: Pt seen for OT session focusing on functional transfers and d/c planning. Pt in supine upon arrival, agreeable to tx session and denying pain. Pt requesting to show therapist picture of home "work out chair". Picture of full body massaging chair. Therapist stated it would not be safe for pt to attempt transfer into that chair as well as safety considerations with massager on recent back surgery site. Pt becoming aggrivated with therapist and recommendations for w/c level and squat pivot transfers only. Again provided reasoning for recommendations and continuum of care to cont to improve with functional mobility. Pt in agreement to at least wait until [facility] therapies can assess massage chair before attempting to transfer into it himself. Pt completed min A squat pivot transfer w/c<> EOB with min A. Pt now with plans to stay in spare bedroom at home as master bed is very high. Pt reports height of second bed as "normal" height, unable to elaborate any further regarding details of bed. Will re-assess tomorrow when pt's girlfriend is here for caregiver education. Pt self propelled w/c throughout unit mod I. In ADL apartment, pt required mod cuing to properly set-up w/c in prep for squat pivot transfer to low soft recliner. Min A squat pivot transfer to get onto couch, mod-max A coming back into the higher level w/c.  Pt returned to room at end of session, min questioning cues for proper set-up of w/c in  prep for transfer back to bed. MIn A squat pivot to return to bed and supervision to return to supine position. Pt left in supine at end of session, all needs in reach and bed alarm on.   Therapy Documentation Precautions:  Precautions Precautions: Fall Restrictions Weight Bearing Restrictions: No   Therapy/Group: Individual  Therapy  Velia Pamer L 03/25/2019, 6:56 AM

## 2019-03-25 NOTE — Progress Notes (Signed)
Physical Therapy Session Note  Patient Details  Name: Dennis Fischer MRN: [id number] Date of Birth: 1946-03-17  Today's Date: 03/25/2019 PT Individual Time: 0920-1000 PT Individual Time Calculation (min): 40 min   Short Term Goals: Week 2:  PT Short Term Goal 1 (Week 2): =LTG due to ELOS  Skilled Therapeutic Interventions/Progress Updates:  Pt received in w/c & agreeable to tx. No c/o pain reported. Pt propels w/c room>BI gym with BUE & supervision. Pt requires mod assist and multiple trials for sit>stand with RW to attempt dynavision. Pt able to stand <60 seconds with max assist to engage in activity to challenge balance and for standing tolerance & BLE strengthening. Pt begins to lean forward & B knee weakness noted & pt requires max cuing to sit down, but even after task pt reports he felt as though it went well and reports he did not feel like he was falling. Pt utilized kinetron from w/c level with focus on BLE strengthening, 4 trials x 1 minute each with obvious LLE weakness observed. Pt propels w/c back to room from dayroom with BUE & supervision with pt reporting fatigue following session. Therapist educated pt on pressure relief techniques while in w/c to prevent skin breakdown on buttocks but pt with poor understanding and return demo of lateral leans and w/c pushups for pressure relief. Pt performed BUE bicep curls with orange theraband and cuing for technique for BUE strengthening. At end of session pt left in w/c with all needs in reach & chair alarm donned.  Therapy Documentation Precautions:  Precautions Precautions: Fall Restrictions Weight Bearing Restrictions: No    Therapy/Group: Individual Therapy  [name] 03/25/2019, 10:01 AM

## 2019-03-25 NOTE — Progress Notes (Signed)
[facility] PHYSICAL MEDICINE & REHABILITATION PROGRESS NOTE   Subjective/Complaints: No problems overnight. Up with OT early again. Unremarkable CT marking yesterday  ROS: Patient denies fever, rash, sore throat, blurred vision, nausea, vomiting, diarrhea, cough, shortness of breath or chest pain,   headache, or mood change.   Objective:   No results found. Recent Labs    03/23/19 0415 03/24/19 1147  WBC 8.9 9.5  HGB 9.7* 11.5*  HCT 29.6* 34.7*  PLT 143* 167   Recent Labs    03/23/19 0415 03/25/19 0335  NA 136 136  K 4.3 4.4  CL 104 105  CO2 23 21*  GLUCOSE 124* 139*  BUN 27* 25*  CREATININE 0.92 0.90  CALCIUM 7.9* 8.1*    Intake/Output Summary (Last 24 hours) at 03/25/2019 0820 Last data filed at 03/25/2019 0758 Gross per 24 hour  Intake 840 ml  Output 1975 ml  Net -1135 ml     Physical Exam: Vital Signs Blood pressure 116/69, pulse 69, temperature 98.4 F (36.9 C), temperature source Oral, resp. rate 17, height 6' (1.829 m), weight 86 kg, SpO2 100 %. Constitutional: No distress . Vital signs reviewed. HEENT: EOMI, oral membranes moist Neck: supple Cardiovascular: RRR without murmur. No JVD    Respiratory: CTA Bilaterally without wheezes or rales. Normal effort    GI: BS +, non-tender, non-distended  Musc: No edema or tenderness in extremities. Neurological: He is alert  Motor: Bilateral upper extremities: 5/5 proximal distal, stable Right lower extremity: Hip flexion, knee extension 4/5, ankle dorsiflexion 4/5 motor exam stable Left LE: 4-/5 prox to distal, impaired LE proprioception Skin: Skin is warm and dry. surigcal inicisionc CDI Psychiatric: pleasant   Assessment/Plan: 1. 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  Physiatrist is providing close team supervision and 24 hour management of active medical  problems listed below.  Physiatrist and rehab team continue to assess barriers to discharge/monitor patient progress toward functional and medical goals  Care Tool:  Bathing    Body parts bathed by patient: Right arm, Left arm, Chest, Abdomen, Face, Left lower leg, Right lower leg, Left upper leg, Right upper leg, Front perineal area, Buttocks   Body parts bathed by helper: Buttocks     Bathing assist Assist Level: Contact Guard/Touching assist     Upper Body Dressing/Undressing Upper body dressing   What is the patient wearing?: Pull over shirt    Upper body assist Assist Level: Set up assist    Lower Body Dressing/Undressing Lower body dressing      What is the patient wearing?: Pants, Incontinence brief     Lower body assist Assist for lower body dressing: Minimal Assistance - Patient > 75%     Toileting Toileting Toileting Activity did not occur (Clothing management and hygiene only): Refused  Toileting assist Assist for toileting: Moderate Assistance - Patient 50 - 74%     Transfers Chair/bed transfer  Transfers assist     Chair/bed transfer assist level: Minimal Assistance - Patient > 75%     Locomotion Ambulation   Ambulation assist   Ambulation activity did not occur: Safety/medical concerns  Assist level: 2 helpers Assistive device: Lite Gait Max distance: 25'   Walk 10 feet activity   Assist  Walk 10 feet activity did not occur: Safety/medical concerns  Assist level: 2 helpers Assistive device: Lite Gait   Walk 50 feet activity   Assist  Walk 50 feet with 2 turns activity did not occur: Safety/medical concerns  Assist level: Dependent - Patient 0% Assistive device: Lite Gait    Walk 150 feet activity   Assist Walk 150 feet activity did not occur: Safety/medical concerns         Walk 10 feet on uneven surface  activity   Assist Walk 10 feet on uneven surfaces activity did not occur: Safety/medical concerns          Wheelchair     Assist Will patient use wheelchair at discharge?: Yes Type of Wheelchair: Manual    Wheelchair assist level: Supervision/Verbal cueing Max wheelchair distance: 200'    Wheelchair 50 feet with 2 turns activity    Assist        Assist Level: Supervision/Verbal cueing   Wheelchair 150 feet activity     Assist     Assist Level: Supervision/Verbal cueing    Medical Problem List and Plan: 1.  Deficits with mobility, endurance, self-care secondary to thoracic myelopathy/metastatic prostate cancer status post decompression .  Continue CIR PT,OT, team conf today   Decadron  taper to 3mg  q6 h on 7/9, decreased to 2 mg every 6 hours on 7/11, decreased to 2mg  q8 hours 7/13, decrease to 2mg  q12 today  -pt s/p CT simulation  at cancer center 7/14  -begins XRT on Monday  2.  Antithrombotics: -DVT/anticoagulation:  Mechanical: Sequential compression devices, below knee Bilateral lower extremities             -antiplatelet therapy: N/A 3. Pain Management: Hydrocodone prn effective.  4. Mood: LCSW to follow for evaluation and support.              -antipsychotic agents: N/A 5. Neuropsych: This patient is capable of making decisions on his own behalf. 6. Skin/Wound Care: Monitor  Incision daily for healing.  7. Fluids/Electrolytes/Nutrition: encourage po 8. HTN: Monitor BP tid--continue Coreg and Norvasc.  Patient Vitals for the past 24 hrs:  BP Temp Temp src Pulse Resp SpO2 Weight  03/25/19 0500 - - - - - - 86 kg  03/25/19 0328 116/69 98.4 F (36.9 C) Oral 69 17 100 % -  03/24/19 1630 127/72 97.7 F (36.5 C) Oral 68 18 98 % -   Controlled on 7/15 9. Neuropathy: Improving  Continue Neurontin at bedtime.     Monitor pain with increased mobility. 10. ?neurogenic bowel/bladder:    -moved bowels 7/13  -augmented regimen      Generally continent and emptying 11.  Steroid-induced hyperglycemia:  Will not make any changes while weaning steroids  -monitor  CBG's BID.    -cover with SSI CBG (last 3)  Recent Labs    03/24/19 0625 03/24/19 1759 03/25/19 0619  GLUCAP 115* 128* 131*    Sugars slowly falling 12. ABLA:  Hemoglobin up to 9.7  13.  Hypoalbuminemia  Protein supplementation 14.  Transaminitis- may be related to Zocor  LFTs sl elevated on 7/2, add LFT's to today's labs if possible  Continue to monitor 15.  Elevated BUN/creatinine ratio  BUN sl decreased today 25  -continue pushing fluids     LOS: 14 days A FACE TO FACE EVALUATION WAS PERFORMED  Meredith Staggers 03/25/2019, 8:20 AM

## 2019-03-25 NOTE — Progress Notes (Signed)
Physical Therapy Session Note  Patient Details  Name: Wahid Holley MRN: [id number] Date of Birth: 04/08/1946  Today's Date: 03/25/2019 PT Individual Time: 1100-1200 PT Individual Time Calculation (min): 60 min   Short Term Goals: Week 2:  PT Short Term Goal 1 (Week 2): =LTG due to ELOS  Skilled Therapeutic Interventions/Progress Updates:    Pt received seated in w/c in room, agreeable to PT session. No complaints of pain. Manual w/c propulsion x 200 ft with use of BUE and Supervision. Demonstrated stand pivot transfer w/c to/from car with use of grab bar in car (which per pt report he has bar to grab onto). Pt is able to perform this transfer with min A to 30" height car, however pt reports his car is even taller than this. Elevated car to 34", pt unable to complete transfer safely with grab bar in car and has to use RW for stand pivot transfer. Pt remains unsafe with this transfer due to LE buckling and decreased proprioception. Sit to stand x 3 reps from w/c to RW with mod A, focus on anterior weight shift in standing as pt tends to lean posteriorly. Pt continues to demo poor eccentric control when sitting. Simulation of pt's bed height, pt unable to safely perform stand pivot transfer with RW to complete transfer. Per pt's significant other they have a 2nd bed that is shorter that the pt can sleep in. Lowered bed height for safe transfer. Squat pivot transfer w/c to/from mat table with min A. Sit to supine Supervision. Supine to sit from flat bed with CGA. Assisted pt back to bed at end of session. Pt left seated in bed with needs in reach, bed alarm in place.  Therapy Documentation Precautions:  Precautions Precautions: Fall Restrictions Weight Bearing Restrictions: No   Therapy/Group: Individual Therapy   Excell Seltzer, PT, DPT  03/25/2019, 12:24 PM

## 2019-03-26 ENCOUNTER — Inpatient Hospital Stay (HOSPITAL_COMMUNITY): Payer: Medicare Other | Admitting: Physical Therapy

## 2019-03-26 ENCOUNTER — Encounter (HOSPITAL_COMMUNITY): Payer: Medicare Other | Admitting: Occupational Therapy

## 2019-03-26 ENCOUNTER — Ambulatory Visit (HOSPITAL_COMMUNITY): Payer: Medicare Other | Admitting: Physical Therapy

## 2019-03-26 LAB — GLUCOSE, CAPILLARY: Glucose-Capillary: 115 mg/dL — ABNORMAL HIGH (ref 70–99)

## 2019-03-26 NOTE — Progress Notes (Signed)
Social Work Patient ID: Dennis Fischer, male   DOB: 1945-12-15, 73 y.o.   MRN: [id number]  Discussions today with pt and girlfriend including d/c arrangements.  Team now recommends change in d/c date to 7/18 to allow for completion of family education with gf and pt's son (who is arriving in town from [place]).  Reviewed HH and DME arranged.  I am also assisting with [facility] referral for transport assist. Both pleased with progress and feel they will be ready for d/c.  Naima Veldhuizen, LCSW

## 2019-03-26 NOTE — Progress Notes (Signed)
[facility] PHYSICAL MEDICINE & REHABILITATION PROGRESS NOTE   Subjective/Complaints: No new issues. Pain controlled. Standing with PT in gym  ROS: Patient denies fever, rash, sore throat, blurred vision, nausea, vomiting, diarrhea, cough, shortness of breath or chest pain,  headache, or mood change.   Objective:   No results found. Recent Labs    03/24/19 1147  WBC 9.5  HGB 11.5*  HCT 34.7*  PLT 167   Recent Labs    03/25/19 0335  NA 136  K 4.4  CL 105  CO2 21*  GLUCOSE 139*  BUN 25*  CREATININE 0.90  CALCIUM 8.1*    Intake/Output Summary (Last 24 hours) at 03/26/2019 0859 Last data filed at 03/26/2019 0724 Gross per 24 hour  Intake 878 ml  Output 1750 ml  Net -872 ml     Physical Exam: Vital Signs Blood pressure 123/75, pulse 62, temperature 98.1 F (36.7 C), temperature source Oral, resp. rate 18, height 6' (1.829 m), weight 86 kg, SpO2 100 %. Constitutional: No distress . Vital signs reviewed. HEENT: EOMI, oral membranes moist Neck: supple Cardiovascular: RRR without murmur. No JVD    Respiratory: CTA Bilaterally without wheezes or rales. Normal effort    GI: BS +, non-tender, non-distended  Musc: No edema or tenderness in extremities. Neurological: He is alert  Motor: Bilateral upper extremities: 5/5 proximal distal, stable Right lower extremity: Hip flexion, knee extension 4/5, ankle dorsiflexion 4/5 motor exam stable Left LE: 4-/5 prox to distal, impaired LE proprioception bilaterally, legs wobbly in stance, fatigued after a minute or so. Difficulty sequencing transfer Skin: Skin is warm and dry. surigcal inicisionc CDI Psychiatric: pleasant  Assessment/Plan: 1. 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  Physiatrist is providing close team supervision and 24 hour management of active medical problems listed  below.  Physiatrist and rehab team continue to assess barriers to discharge/monitor patient progress toward functional and medical goals  Care Tool:  Bathing    Body parts bathed by patient: Right arm, Left arm, Chest, Abdomen, Face, Left lower leg, Right lower leg, Left upper leg, Right upper leg, Front perineal area, Buttocks   Body parts bathed by helper: Buttocks     Bathing assist Assist Level: Contact Guard/Touching assist     Upper Body Dressing/Undressing Upper body dressing   What is the patient wearing?: Pull over shirt    Upper body assist Assist Level: Set up assist    Lower Body Dressing/Undressing Lower body dressing      What is the patient wearing?: Pants, Incontinence brief     Lower body assist Assist for lower body dressing: Minimal Assistance - Patient > 75%     Toileting Toileting Toileting Activity did not occur (Clothing management and hygiene only): Refused  Toileting assist Assist for toileting: Moderate Assistance - Patient 50 - 74%     Transfers Chair/bed transfer  Transfers assist     Chair/bed transfer assist level: Minimal Assistance - Patient > 75%     Locomotion Ambulation   Ambulation assist   Ambulation activity did not occur: Safety/medical concerns  Assist level: 2 helpers Assistive device: Lite Gait Max distance: 25'   Walk 10 feet activity   Assist  Walk 10 feet activity did not occur: Safety/medical concerns  Assist level: 2 helpers Assistive device: Lite Gait   Walk 50 feet activity   Assist Walk 50 feet with 2 turns  activity did not occur: Safety/medical concerns  Assist level: Dependent - Patient 0% Assistive device: Lite Gait    Walk 150 feet activity   Assist Walk 150 feet activity did not occur: Safety/medical concerns         Walk 10 feet on uneven surface  activity   Assist Walk 10 feet on uneven surfaces activity did not occur: Safety/medical concerns          Wheelchair     Assist Will patient use wheelchair at discharge?: Yes Type of Wheelchair: Manual    Wheelchair assist level: Supervision/Verbal cueing Max wheelchair distance: 200'    Wheelchair 50 feet with 2 turns activity    Assist        Assist Level: Supervision/Verbal cueing   Wheelchair 150 feet activity     Assist     Assist Level: Supervision/Verbal cueing    Medical Problem List and Plan: 1.  Deficits with mobility, endurance, self-care secondary to thoracic myelopathy/metastatic prostate cancer status post decompression .  Continue CIR PT,OT, team conf today   Decadron    decreased to 2mg  q12 7/15---continue at this dose upon dc  -pt s/p CT simulation  at cancer center 7/14  -begins XRT on Monday  2.  Antithrombotics: -DVT/anticoagulation:  Mechanical: Sequential compression devices, below knee Bilateral lower extremities             -antiplatelet therapy: N/A 3. Pain Management: Hydrocodone prn effective.  4. Mood: LCSW to follow for evaluation and support.              -antipsychotic agents: N/A 5. Neuropsych: This patient is capable of making decisions on his own behalf. 6. Skin/Wound Care: Monitor  Incision daily for healing.  7. Fluids/Electrolytes/Nutrition: encourage po 8. HTN: Monitor BP tid--continue Coreg and Norvasc.  Patient Vitals for the past 24 hrs:  BP Temp Temp src Pulse Resp SpO2  03/26/19 0539 123/75 98.1 F (36.7 C) Oral 62 18 100 %  03/25/19 1952 130/85 98.3 F (36.8 C) Oral 88 19 100 %  03/25/19 1417 120/76 98.3 F (36.8 C) - 85 17 100 %   Controlled on 7/16 9. Neuropathy: Improving  Continue Neurontin at bedtime.     Monitor pain with increased mobility. 10. ?neurogenic bowel/bladder:    -moved bowels 7/13  -augmented regimen      Generally continent and emptying 11.  Steroid-induced hyperglycemia:  Will not make any changes while weaning steroids  -monitor CBG's BID.    -cover with SSI CBG (last 3)  Recent  Labs    03/25/19 1820 03/25/19 2101 03/26/19 0538  GLUCAP 137* 137* 115*    Sugars improved 12. ABLA:  Hemoglobin up to 9.7  13.  Hypoalbuminemia  Protein supplementation 14.  Transaminitis- may be related to Zocor  LFTs sl elevated on 7/2, add LFT's to today's labs if possible  Continue to monitor 15.  Elevated BUN/creatinine ratio  BUN sl decreased today 25  -continue pushing fluids  -outpt follow up     LOS: 15 days A FACE TO [place] 03/26/2019, 8:59 AM

## 2019-03-26 NOTE — Progress Notes (Signed)
Physical Therapy Discharge Summary  Patient Details  Name: Dennis Fischer MRN: [id number] Date of Birth: 10-31-1945   Patient has met 5 of 5 long term goals due to improved activity tolerance, improved balance, improved postural control, increased strength and ability to compensate for deficits.  Patient to discharge at a wheelchair level Modified Independent with min A required for transfers.   Patient's care partner is independent to provide the necessary physical assistance at discharge. Patient's significant other has completed family training on how to assist pt with bed mobility, bed to/from chair transfers, and car transfers.  Recommendation:  Patient will benefit from ongoing skilled PT services in home health setting to continue to advance safe functional mobility, address ongoing impairments in balance, LE strength, endurance, coordination, safety, and minimize fall risk.  Equipment: No equipment provided. Pt already owns RW and manual w/c.  Reasons for discharge: treatment goals met and discharge from hospital  Patient/family agrees with progress made and goals achieved: Yes  PT Discharge Precautions/Restrictions Precautions Precautions: Fall Restrictions Weight Bearing Restrictions: No Vital Signs Therapy Vitals Temp: 98.8 F (37.1 C) Temp Source: Oral Pulse Rate: 78 Resp: 16 BP: 122/72 Patient Position (if appropriate): Lying Oxygen Therapy SpO2: 99 % O2 Device: Room Air Vision/Perception  Vision - History Baseline Vision: Wears glasses all the time Perception Perception: Within Functional Limits Praxis Praxis: Intact  Cognition Overall Cognitive Status: Within Functional Limits for tasks assessed Arousal/Alertness: Awake/alert Orientation Level: Oriented X4 Attention: Focused Focused Attention: Appears intact Memory: Appears intact Awareness: Impaired Awareness Impairment: Emergent impairment;Anticipatory impairment Problem Solving:  Impaired Safety/Judgment: Impaired Comments: Decreased awareness of deficits and their functional implications Sensation Sensation Light Touch: Impaired Detail Light Touch Impaired Details: Impaired RLE;Impaired LLE Proprioception: Impaired Detail Proprioception Impaired Details: Impaired RLE;Impaired LLE Coordination Gross Motor Movements are Fluid and Coordinated: No Fine Motor Movements are Fluid and Coordinated: Yes Coordination and Movement Description: B LE ataxia and generalized weakness, improved since eval Motor  Motor Motor: Ataxia;Paraplegia Motor - Discharge Observations: ongoing BLE ataxia, improved since eval  Mobility Bed Mobility Bed Mobility: Rolling Right;Rolling Left;Supine to Sit;Sit to Supine Rolling Right: Supervision/verbal cueing Rolling Left: Supervision/Verbal cueing Supine to Sit: Supervision/Verbal cueing Sit to Supine: Supervision/Verbal cueing Transfers Transfers: Squat Pivot Transfers Squat Pivot Transfers: Minimal Assistance - Patient > 75% Locomotion  Gait Ambulation: No Gait Gait: No Stairs / Additional Locomotion Stairs: No Wheelchair Mobility Wheelchair Mobility: Yes Wheelchair Assistance: Independent with Camera operator: Both upper extremities Wheelchair Parts Management: Supervision/cueing Distance: 150  Trunk/Postural Assessment  Cervical Assessment Cervical Assessment: Within Functional Limits Thoracic Assessment Thoracic Assessment: Exceptions to WFL(rounded shoulders; kyphotic) Lumbar Assessment Lumbar Assessment: Exceptions to WFL(posterior pelvic tilt) Postural Control Postural Control: Deficits on evaluation  Balance Balance Balance Assessed: Yes Static Sitting Balance Static Sitting - Balance Support: No upper extremity supported;Feet supported Static Sitting - Level of Assistance: 6: Modified independent (Device/Increase time) Dynamic Sitting Balance Dynamic Sitting - Balance Support: No  upper extremity supported;Feet supported;During functional activity Dynamic Sitting - Level of Assistance: 5: Stand by assistance Static Standing Balance Static Standing - Balance Support: Bilateral upper extremity supported;During functional activity Static Standing - Level of Assistance: 4: Min assist Dynamic Standing Balance Dynamic Standing - Balance Support: Bilateral upper extremity supported;During functional activity Dynamic Standing - Level of Assistance: 3: Mod assist Extremity Assessment  RUE Assessment RUE Assessment: Within Functional Limits LUE Assessment LUE Assessment: Within Functional Limits RLE Assessment RLE Assessment: Exceptions to [facility] Passive Range of Motion (PROM) Comments: [facility] General Strength  Comments: impaired, see below RLE Strength Right Hip Flexion: 4+/5 Right Knee Flexion: 5/5 Right Knee Extension: 5/5 Right Ankle Dorsiflexion: 4+/5 LLE Assessment LLE Assessment: Exceptions to [facility] Active Range of Motion (AROM) Comments: WFL General Strength Comments: impaired, see below LLE Strength Left Hip Flexion: 3+/5 Left Knee Flexion: 4/5 Left Knee Extension: 4+/5 Left Ankle Dorsiflexion: 3+/5     Dennis Fischer, PT, DPT Dennis Fischer, PT, DPT 03/26/2019, 4:10 PM

## 2019-03-26 NOTE — Progress Notes (Signed)
Physical Therapy Session Note  Patient Details  Name: Dennis Fischer MRN: [id number] Date of Birth: [birth year]  Today's Date: 03/26/2019 PT Individual Time: 0800-0910; 1300-1400 PT Individual Time Calculation (min): 70 min and 60 min   Short Term Goals: Week 2:  PT Short Term Goal 1 (Week 2): =LTG due to ELOS  Skilled Therapeutic Interventions/Progress Updates:    Session 1: Pt received seated in bed, agreeable to PT session. No complaints of pain. Bed mobility Supervision with use of bedrail. Pt is setup A to change brief and pants at bed level with v/c for bridging technique for pulling up pants. Supine to sit Supervision. Squat pivot transfer bed to w/c with min A with v/c for safe setup for transfer. Pt is setup A to bathe upper body and change shirt as well as brush teeth while seated in w/c at sink. At one point pt begins to setup w/c so that he can stand. Pt reports he plans on standing at the sink to bathe himself. Education with patient about how he exhibits decreased safety in standing and will need use of BUE for support in standing. Pt agreeable to continue bathing himself at w/c level. Manual w/c propulsion x 150 ft with use of BUE at mod I level. Pt requires min cueing for safe setup of w/c for squat pivot transfer. Squat pivot w/c to/from therapy mat with min A. Sit to stand x 5 reps with min A to RW, focus on eccentric control when sitting. Pt continues to demo poor eccentric control and "plops" or falls as he sits. Pt reports urinary incontinence. Assisted pt back to his room. Sit to stand with mod A to RW for dependent clothing change, setup A for pericare while seated in w/c. Pt left seated in w/c in room with needs in reach, quick release belt and chair alarm in place at end of session. RN present at end of session for pt's AM medications.  Session 2: Pt received seated in bed, agreeable to PT session. Pt's significant other [name] present for hands-on family training.  Demonstrated how to assist pt with scooting L/R and rolling in bed via log roll technique and bridging. Supine to sit with Supervision with increased time with cues for not using bedrail. Squat pivot transfer bed to w/c with min A. Pt's caregiver demos good understanding of and ability to assist pt with bed mobility and squat pivot transfers safely. Car transfer w/c to/from car via semi-squat/stand pivot technique using grab bar above passenger seat with mod A. Pt continues to exhibit decreased safety awareness and requires v/c to perform this transfer safely. Pt's care partner demos good understanding of how to best assist pt with transfer safely. Will continue 2nd family education system tomorrow with patient's significant other and his son. Manual w/c propulsion x 150 ft with use of BUE mod I. Squat pivot transfer back to bed min A. Sit to supine Supervision. Pt left semi-reclined in bed with needs in reach, bed alarm in place at end of session.   Therapy Documentation Precautions:  Precautions Precautions: Fall Restrictions Weight Bearing Restrictions: No    Therapy/Group: Individual Therapy   Excell Seltzer, PT, DPT  03/26/2019, 3:33 PM

## 2019-03-26 NOTE — Progress Notes (Signed)
Occupational Therapy Session Note  Patient Details  Name: Dennis Fischer MRN: [id number] Date of Birth: 1946/01/30  Today's Date: 03/26/2019 OT Individual Time: 1100-1200 OT Individual Time Calculation (min): 60 min    Short Term Goals: Week 2:  OT Short Term Goal 1 (Week 2): STG=LTG due to LOS  Skilled Therapeutic Interventions/Progress Updates:    Pt seen for OT family education session. Pt sitting up in w/c upon arrival with girilfriend/caregiver, [name] arriving for scheduled family education.  Hands on training performed by [name] following demonstration by therapist for squat pivot transfers w/c<> EOB, EOB<> supine, EOB <> drop arm BSC. Each transfer completed with min A overall. He required assist for recalling proper set-up of w/c in prep for transfer, [name] able to offer appropriate cuing and ensuring proper set-up.  Reviewed pt's routine for toileting task and bathing/dressing while seated on BSC.  Recommendations for w/c level only at d/c, no ambulation, and no standing at RW. Educated regarding pt's CLOF, physical deficits including ataxia, decreased proprioception and functional implications, DME, importance of participation/independence with ADLs, need for assist for all mobility, proper body mechanics for transfers and providing assist, continuum of care, log rolling technques and d/c planning. . Pt able to recall 1/3 back pre-cautions.  Pt returned to supine at end of session, left with all needs in reach and bed alarm on, girlfriend present.  Pt's girlfirned seemed very comfortable with providing needed assist at home and understanding recommendations. Plan for another day of family education tomorrow with pt's girlfriend and son prior to d/c home.   Therapy Documentation Precautions:  Precautions Precautions: Fall Restrictions Weight Bearing Restrictions: No   Therapy/Group: Individual Therapy  [name] L 03/26/2019, 7:03 AM

## 2019-03-27 ENCOUNTER — Encounter (HOSPITAL_COMMUNITY): Payer: Medicare Other | Admitting: Occupational Therapy

## 2019-03-27 ENCOUNTER — Ambulatory Visit (HOSPITAL_COMMUNITY): Payer: Medicare Other

## 2019-03-27 DIAGNOSIS — C7952 Secondary malignant neoplasm of bone marrow: Secondary | ICD-10-CM | POA: Diagnosis not present

## 2019-03-27 DIAGNOSIS — Z87891 Personal history of nicotine dependence: Secondary | ICD-10-CM | POA: Diagnosis not present

## 2019-03-27 DIAGNOSIS — C61 Malignant neoplasm of prostate: Secondary | ICD-10-CM | POA: Diagnosis not present

## 2019-03-27 DIAGNOSIS — I10 Essential (primary) hypertension: Secondary | ICD-10-CM | POA: Diagnosis not present

## 2019-03-27 DIAGNOSIS — C7951 Secondary malignant neoplasm of bone: Secondary | ICD-10-CM | POA: Diagnosis not present

## 2019-03-27 DIAGNOSIS — Z8042 Family history of malignant neoplasm of prostate: Secondary | ICD-10-CM | POA: Diagnosis not present

## 2019-03-27 MED ORDER — POLYETHYLENE GLYCOL 3350 17 G PO PACK: 17.0000 g | PACK | Freq: Every day | ORAL | 1 refills | Status: DC

## 2019-03-27 MED ORDER — CYCLOBENZAPRINE HCL 10 MG PO TABS: 10.0000 mg | ORAL_TABLET | Freq: Three times a day (TID) | ORAL | 0 refills | Status: DC | PRN

## 2019-03-27 MED ORDER — SENNOSIDES-DOCUSATE SODIUM 8.6-50 MG PO TABS: 2.0000 | ORAL_TABLET | Freq: Every day | ORAL | 1 refills | Status: DC

## 2019-03-27 MED ORDER — ACETAMINOPHEN 325 MG PO TABS: 325.0000 mg | ORAL_TABLET | ORAL | Status: DC | PRN

## 2019-03-27 MED ORDER — DEXAMETHASONE 2 MG PO TABS: 2.0000 mg | ORAL_TABLET | Freq: Two times a day (BID) | ORAL | 1 refills | Status: DC

## 2019-03-27 NOTE — Progress Notes (Signed)
Occupational Therapy Session Note  Patient Details  Name: Dennis Fischer MRN: [id number] Date of Birth: 1946/08/29  Today's Date: 03/27/2019 OT Individual Time: 1300-1400 OT Individual Time Calculation (min): 60 min    Short Term Goals: Week 2:  OT Short Term Goal 1 (Week 2): STG=LTG due to LOS  Skilled Therapeutic Interventions/Progress Updates:    Pt seen for OT family education session. Pt in supine upon arrival with girlfriend and son present for scheduled family ed session. Pt denying pain and agreeable to tx session.  Reviewed with family and pt strict recommendations for no standing and no ambulation at d/c. Pt's son surprised by this, educated regarding pt's COF, neuro deficits and functional implications and recommendations for w/c level only and squat pivot transfers at d/c. Pt wit poor carryover understanding of functional implications of recommendations.  He transferred to sitting EOB from flat bed with supervision. He completed squat pivot transfers throughout session EOB<> drop arm BSC with pt's girlfriend assisting and EOB <> w/c. Pt's girlfriend assisted with BSC transfers, demonstrating good recall of information taught in yesterday's family ed session. Pt's son assisted with EOB<> w/c. Pt's son exited during bathing/dressing portion of session, was only present for therapist to provide recommendations and to complete w/c<> EOB transfer.  Pt completed bathing/dressing routine seated on BSC. VCs required for sequencing and initiation of use of AE. Pt's girlfriend reports she has noticed a change in pt's cognitive including memory and problem solving, Education provided regarding cognitive deficits and functional implications. UB bathing/dressing completed with set-up/supervision and LB completed with min A. Pt performing push up on armrets of BSC in order for caregiver to pull pants up in order to decrease fall risk reduce burden of care. Pt left sitting up in w/c at end of session  with family members present awaiting PT session. Education provided throughout session regarding OT/PT recommendations, DME, continuum of care, energy conservation, proper body mechanics when providing care and d/c planning.   Therapy Documentation Precautions:  Precautions Precautions: Fall Restrictions Weight Bearing Restrictions: No   Therapy/Group: Individual Therapy  Jaquawn Saffran L 03/27/2019, 6:52 AM

## 2019-03-27 NOTE — Progress Notes (Signed)
Physical Therapy Session Note  Patient Details  Name: Dennis Fischer MRN: [id number] Date of Birth: [birth year]  Today's Date: 03/27/2019 PT Individual Time: 1400-1435 PT Individual Time Calculation (min): 35 min   Short Term Goals: Week 2:  PT Short Term Goal 1 (Week 2): =LTG due to ELOS  Skilled Therapeutic Interventions/Progress Updates:     Patient in w/c with his wife and son in room upon PT arrival. Patient alert and agreeable to PT session. Family agreeable to education on bumping up a step and car transfers this session prior to d/c. Patient was very eager to go home.   Therapeutic Activity: Transfers: Patient performed car transfers x1 with PT for demonstration of assist technique to family and x1 with his son assiting going in and his wife assisting coming out with min A. Provided verbal cues for proper body mechanics for family assisting and hand placement for patient. Family demonstrated safe guarding and proper body mechanics during transfer.  Wheelchair Mobility:  Patient propelled wheelchair 50 feet, limited by fatigue this afternoon, with mod I and set up assist from family for leg rests with min cues from therapist for donning doffing leg rests, and locking breaks during session. PT provided education on bumping w/c up 2 steps in the stairwell. Patient's son and wife performed bumping up 2 steps with good technique and body mechanics with min cues form therapist for holding onto w/c frame and lifting with a straight back.  Patient in w/c with family in room at end of session with breaks locked and all needs within reach.    Therapy Documentation Precautions:  Precautions Precautions: Fall Restrictions Weight Bearing Restrictions: No Pain: Patient denied pain throughout session.    Therapy/Group: Individual Therapy  Ettamae Barkett L Amoni Morales PT, DPT  03/27/2019, 4:49 PM

## 2019-03-27 NOTE — Progress Notes (Signed)
[facility] PHYSICAL MEDICINE & REHABILITATION PROGRESS NOTE   Subjective/Complaints: Up in bed. No new complaints. Stated that his family was coming at 2pm to pick hm up today  ROS: Patient denies fever, rash, sore throat, blurred vision, nausea, vomiting, diarrhea, cough, shortness of breath or chest pain,   headache, or mood change.   Objective:   No results found. Recent Labs    03/24/19 1147  WBC 9.5  HGB 11.5*  HCT 34.7*  PLT 167   Recent Labs    03/25/19 0335  NA 136  K 4.4  CL 105  CO2 21*  GLUCOSE 139*  BUN 25*  CREATININE 0.90  CALCIUM 8.1*    Intake/Output Summary (Last 24 hours) at 03/27/2019 0826 Last data filed at 03/27/2019 0751 Gross per 24 hour  Intake 1022 ml  Output 2500 ml  Net -1478 ml     Physical Exam: Vital Signs Blood pressure 113/77, pulse 64, temperature 98.3 F (36.8 C), temperature source Oral, resp. rate 16, height 6' (1.829 m), weight 85.3 kg, SpO2 100 %. Constitutional: No distress . Vital signs reviewed. HEENT: EOMI, oral membranes moist Neck: supple Cardiovascular: RRR without murmur. No JVD    Respiratory: CTA Bilaterally without wheezes or rales. Normal effort    GI: BS +, non-tender, non-distended  Musc: No edema or tenderness in extremities. Neurological: He is alert  Motor: Bilateral upper extremities: 5/5 proximal distal, stable Right lower extremity: Hip flexion, knee extension 4/5, ankle dorsiflexion 4/5 motor exam stable Left LE: 4-/5 prox to distal, impaired LE proprioception bilaterally  Skin: Skin is warm and dry. surigcal inicisionc CDI Psychiatric: pleasant  Assessment/Plan: 1. 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  Physiatrist is providing close team supervision and 24 hour management of active medical problems listed below.  Physiatrist and rehab team continue to  assess barriers to discharge/monitor patient progress toward functional and medical goals  Care Tool:  Bathing    Body parts bathed by patient: Right arm, Left arm, Chest, Abdomen, Face, Left lower leg, Right lower leg, Left upper leg, Right upper leg, Front perineal area, Buttocks   Body parts bathed by helper: Buttocks     Bathing assist Assist Level: Supervision/Verbal cueing     Upper Body Dressing/Undressing Upper body dressing   What is the patient wearing?: Pull over shirt    Upper body assist Assist Level: Set up assist    Lower Body Dressing/Undressing Lower body dressing      What is the patient wearing?: Pants, Incontinence brief     Lower body assist Assist for lower body dressing: Minimal Assistance - Patient > 75%     Toileting Toileting Toileting Activity did not occur (Clothing management and hygiene only): Refused  Toileting assist Assist for toileting: Moderate Assistance - Patient 50 - 74%     Transfers Chair/bed transfer  Transfers assist     Chair/bed transfer assist level: Minimal Assistance - Patient > 75%     Locomotion Ambulation   Ambulation assist   Ambulation activity did not occur: Safety/medical concerns  Assist level: 2 helpers Assistive device: Lite Gait Max distance: 25'   Walk 10 feet activity   Assist  Walk 10 feet activity did not occur: Safety/medical concerns  Assist level: 2 helpers Assistive device: Lite Gait   Walk 50 feet activity   Assist Walk 50 feet with 2 turns activity did not  occur: Safety/medical concerns  Assist level: Dependent - Patient 0% Assistive device: Lite Gait    Walk 150 feet activity   Assist Walk 150 feet activity did not occur: Safety/medical concerns         Walk 10 feet on uneven surface  activity   Assist Walk 10 feet on uneven surfaces activity did not occur: Safety/medical concerns         Wheelchair     Assist Will patient use wheelchair at  discharge?: Yes Type of Wheelchair: Manual    Wheelchair assist level: Independent Max wheelchair distance: 200'    Wheelchair 50 feet with 2 turns activity    Assist        Assist Level: Independent   Wheelchair 150 feet activity     Assist     Assist Level: Independent    Medical Problem List and Plan: 1.  Deficits with mobility, endurance, self-care secondary to thoracic myelopathy/metastatic prostate cancer status post decompression .  -Continue CIR therapies including PT, OT    Decadron    decreased to 2mg  q12 7/15---continue at this dose upon dc  -pt s/p CT simulation  at cancer center 7/14  -begins XRT on Monday   -home today vs tomorrow. Apparently discussion had with pt/girlfiend yesterday about recs. Pt did not appear to have recall of this conversation. 2.  Antithrombotics: -DVT/anticoagulation:  Mechanical: Sequential compression devices, below knee Bilateral lower extremities             -antiplatelet therapy: N/A 3. Pain Management: Hydrocodone prn effective.  4. Mood: LCSW to follow for evaluation and support.              -antipsychotic agents: N/A 5. Neuropsych: This patient is capable of making decisions on his own behalf. 6. Skin/Wound Care: Monitor  Incision daily for healing.  7. Fluids/Electrolytes/Nutrition: encourage po 8. HTN: Monitor BP tid--continue Coreg and Norvasc.  Patient Vitals for the past 24 hrs:  BP Temp Temp src Pulse Resp SpO2 Weight  03/27/19 0644 113/77 98.3 F (36.8 C) Oral 64 16 100 % 85.3 kg  03/26/19 2000 - 98 F (36.7 C) Oral 66 - 100 % -  03/26/19 2000 116/72 - - 68 16 100 % -  03/26/19 1407 122/72 98.8 F (37.1 C) Oral 78 16 99 % -  03/26/19 0901 128/77 - - 66 - - -   Controlled on 7/17 9. Neuropathy: Improving  Continue Neurontin at bedtime.     Monitor pain with increased mobility. 10. ?neurogenic bowel/bladder:    -moved bowels 7/15  -augmented regimen      Generally continent and emptying 11.   Steroid-induced hyperglycemia:  Will not make any changes while weaning steroids  -monitor CBG's BID.    -cover with SSI CBG (last 3)  Recent Labs    03/25/19 1820 03/25/19 2101 03/26/19 0538  GLUCAP 137* 137* 115*    Sugars acceptable 12. ABLA:  Hemoglobin up to 9.7  13.  Hypoalbuminemia  Protein supplementation 14.  Transaminitis- may be related to Zocor  LFTs sl elevated on 7/2, add LFT's to today's labs if possible  Continue to monitor 15.  Elevated BUN/creatinine ratio  BUN sl decreased  7/15 25  -continue pushing fluids  -outpt follow up     LOS: 16 days A FACE TO [place] 03/27/2019, 8:26 AM

## 2019-03-27 NOTE — Progress Notes (Signed)
Pt to DC home today. DC summary complete by PA. Belongings packed and Son and GF present. No questions/concerns noted. Will transport to main lobby for DC.  Erie Noe, RN

## 2019-03-27 NOTE — Progress Notes (Signed)
Occupational Therapy Discharge Summary  Patient Details  Name: Dennis Fischer MRN: [id number] Date of Birth: 03-15-1946   Patient has met 9 of 9 long term goals due to improved activity tolerance, improved balance, postural control, ability to compensate for deficits, improved awareness and improved coordination.  Patient to discharge at [facility] Assist level.  Patient's care partner is independent to provide the necessary physical assistance at discharge.  Pt's girlfriend and son have completed hands on family education. They demonstrate and voice willing and ableness to provide needed assist at d/c.  Recommending strict w/c level only activities at d/c. He can complete squat pivot transfers with CGA-min A, with VCs for proper set-up of w/c equipment and technique for transfer.  He is unable to access home bathroom from w/c level. Therefore, recommending bathing/dressing while seated on drop arm BSC. He is using AE for LB bathing/dressing. While pt is able to complete sit>stand at [facility], do not recommend this at home due to pt's severe B LE ataxia and proprioceptive deficits which make him a very big fall risk. Pt and family voice understanding and agreement with recommendations.   Recommendation:  Patient will benefit from ongoing skilled OT services in home health setting to continue to advance functional skills in the area of BADL and Reduce care partner burden.  Equipment: Drop arm BSC  Reasons for discharge: treatment goals met and discharge from hospital  Patient/family agrees with progress made and goals achieved: Yes  OT Discharge Precautions/Restrictions  Precautions Precautions: Fall Restrictions Weight Bearing Restrictions: No Vision Baseline Vision/History: Wears glasses Wears Glasses: At all times Patient Visual Report: No change from baseline Vision Assessment?: No apparent visual deficits Perception  Perception: Within Functional Limits Praxis Praxis:  Intact Cognition Overall Cognitive Status: Impaired/Different from baseline Arousal/Alertness: Awake/alert Orientation Level: Oriented X4 Attention: Selective Selective Attention: Impaired Selective Attention Impairment: Verbal complex;Functional complex Memory: Impaired Memory Impairment: Decreased recall of new information Awareness: Impaired Awareness Impairment: Emergent impairment;Anticipatory impairment Problem Solving: Impaired Problem Solving Impairment: Verbal basic;Functional basic Safety/Judgment: Impaired Comments: Decreased awareness of deficits and their functional implications. Poor recall of recommendations and safety awareness Sensation Sensation Light Touch: Impaired Detail Light Touch Impaired Details: Impaired RLE;Impaired LLE Proprioception: Impaired Detail Proprioception Impaired Details: Absent LLE;Absent RLE Coordination Gross Motor Movements are Fluid and Coordinated: No Fine Motor Movements are Fluid and Coordinated: Yes Coordination and Movement Description: , proprioceptive deficits and generalized weakness all impacting coordination and functional movements Motor  Motor Motor: Ataxia;Paraplegia Motor - Discharge Observations: ongoing BLE ataxia, improved since eval Trunk/Postural Assessment  Cervical Assessment Cervical Assessment: Within Functional Limits Thoracic Assessment Thoracic Assessment: Exceptions to WFL(Back pre-cautions) Lumbar Assessment Lumbar Assessment: Exceptions to WFL(Posterior pelvic tilt) Postural Control Postural Control: Deficits on evaluation  Balance Balance Balance Assessed: Yes Static Sitting Balance Static Sitting - Balance Support: No upper extremity supported;Feet supported Static Sitting - Level of Assistance: 6: Modified independent (Device/Increase time) Static Sitting - Comment/# of Minutes: Sitting EOB Dynamic Sitting Balance Dynamic Sitting - Balance Support: No upper extremity supported;Feet  supported;During functional activity Dynamic Sitting - Level of Assistance: 5: Stand by assistance Sitting balance - Comments: Sitting on BSC while completing bathing/dressing routine Static Standing Balance Static Standing - Balance Support: Bilateral upper extremity supported;During functional activity Static Standing - Level of Assistance: 4: Min assist Static Standing - Comment/# of Minutes: Standing with RW Dynamic Standing Balance Dynamic Standing - Balance Support: Bilateral upper extremity supported;During functional activity Dynamic Standing - Level of Assistance: 3: Mod assist Dynamic Standing -  Comments: Standing at [place] while completing LB clothing management Extremity/Trunk Assessment RUE Assessment RUE Assessment: Within Functional Limits LUE Assessment LUE Assessment: Within Functional Limits   Alyannah Sanks L 03/27/2019, 2:48 PM

## 2019-03-27 NOTE — Discharge Summary (Signed)
Physician Discharge Summary  Patient ID: Dennis Fischer MRN: [id number] DOB/AGE: [birth year] 73 y.o.  Admit date: 03/11/2019 Discharge date: 03/27/2019  Discharge Diagnoses:  Principal Problem:   Metastatic cancer to spine [facility]) Active Problems:   Hypoalbuminemia due to protein-calorie malnutrition ([place])   Acute blood loss anemia   Steroid-induced hyperglycemia   Essential hypertension   Transaminitis   Elevated BUN   Discharged Condition: stable   Significant Diagnostic Studies: Vas [place] Lower Extremity Venous (dvt)  Result Date: 03/13/2019  Lower Venous Study Indications: Paraplegia.  Comparison Study: No prior Performing Technologist: Abram Sander RVS  Examination Guidelines: A complete evaluation includes B-mode imaging, spectral Doppler, color Doppler, and power Doppler as needed of all accessible portions of each vessel. Bilateral testing is considered an integral part of a complete examination. Limited examinations for reoccurring indications may be performed as noted.  +---------+---------------+---------+-----------+----------+--------------+ RIGHT    CompressibilityPhasicitySpontaneityPropertiesSummary        +---------+---------------+---------+-----------+----------+--------------+ CFV      Full           Yes      Yes                                 +---------+---------------+---------+-----------+----------+--------------+ SFJ      Full                                                        +---------+---------------+---------+-----------+----------+--------------+ FV Prox  Full                                                        +---------+---------------+---------+-----------+----------+--------------+ FV Mid   Full                                                        +---------+---------------+---------+-----------+----------+--------------+ FV DistalFull                                                         +---------+---------------+---------+-----------+----------+--------------+ PFV      Full                                                        +---------+---------------+---------+-----------+----------+--------------+ POP      Full           Yes      Yes                                 +---------+---------------+---------+-----------+----------+--------------+ PTV      Full                                                        +---------+---------------+---------+-----------+----------+--------------+  PERO                                                  Not visualized +---------+---------------+---------+-----------+----------+--------------+   +---------+---------------+---------+-----------+----------+--------------+ LEFT     CompressibilityPhasicitySpontaneityPropertiesSummary        +---------+---------------+---------+-----------+----------+--------------+ CFV      Full           Yes      Yes                                 +---------+---------------+---------+-----------+----------+--------------+ SFJ      Full                                                        +---------+---------------+---------+-----------+----------+--------------+ FV Prox  Full                                                        +---------+---------------+---------+-----------+----------+--------------+ FV Mid   Full                                                        +---------+---------------+---------+-----------+----------+--------------+ FV DistalFull                                                        +---------+---------------+---------+-----------+----------+--------------+ PFV      Full                                                        +---------+---------------+---------+-----------+----------+--------------+ POP      Full           Yes      Yes                                  +---------+---------------+---------+-----------+----------+--------------+ PTV      Full                                                        +---------+---------------+---------+-----------+----------+--------------+ PERO  Not visualized +---------+---------------+---------+-----------+----------+--------------+     Summary: Right: There is no evidence of deep vein thrombosis in the lower extremity. No cystic structure found in the popliteal fossa. Left: There is no evidence of deep vein thrombosis in the lower extremity. No cystic structure found in the popliteal fossa.  *See table(s) above for measurements and observations. Electronically signed by Servando Snare MD on 03/13/2019 at 11:03:21 AM.    Final     Labs:  Basic Metabolic Panel: BMP Latest Ref Rng & Units 03/25/2019 03/23/2019 03/18/2019  Glucose 70 - 99 mg/dL 139(H) 124(H) 135(H)  BUN 8 - 23 mg/dL 25(H) 27(H) 25(H)  Creatinine 0.61 - 1.24 mg/dL 0.90 0.92 1.01  Sodium 135 - 145 mmol/L 136 136 136  Potassium 3.5 - 5.1 mmol/L 4.4 4.3 3.8  Chloride 98 - 111 mmol/L 105 104 105  CO2 22 - 32 mmol/L 21(L) 23 22  Calcium 8.9 - 10.3 mg/dL 8.1(L) 7.9(L) 8.0(L)    CBC: CBC Latest Ref Rng & Units 03/24/2019 03/23/2019 03/13/2019  WBC 4.0 - 10.5 K/uL 9.5 8.9 6.9  Hemoglobin 13.0 - 17.0 g/dL 11.5(L) 9.7(L) 8.6(L)  Hematocrit 39.0 - 52.0 % 34.7(L) 29.6(L) 25.5(L)  Platelets 150 - 400 K/uL 167 143(L) 182    CBG: Recent Labs  Lab 03/24/19 1759 03/25/19 0619 03/25/19 1820 03/25/19 2101 03/26/19 0538  GLUCAP 128* 131* 137* 137* 115*    Brief HPI:   Dennis Fischer is a 73 year old male with history of HTN, prostate cancer--undergoing chemo, severe neuropathy with progressive weakness BLE for the past few months with multiple falls and inability to walk.  Work-up done revealing metastatic multiple metastatic lesions to bone and spine.  MRI thoracic spine done for work-up by [name] on  6/29/ 20 showing cord compression at T4 and T8 with significant osseous metastatic disease with thoracic spine and involvement of vertebral body from T4-T8.  He was admitted via ED and was taken to the OR on 03/10/2019 for T7-T9 decompressive laminectomies with resection of epidural tumor by Dr. Trenton Gammon.  Postop started on Decadron with improvement in BLE strength.  Therapy was ongoing and CIR recommended due to functional decline.   Hospital Course: Dennis Fischer was admitted to rehab 03/11/2019 for inpatient therapies to consist of PT and OT at least three hours five days a week. Past admission physiatrist, therapy team and rehab RN have worked together to provide customized collaborative inpatient rehab. Bilateral lower extremity dopplers were negative for DVTs and SCDs were used for DVT prophylaxis.  Blood pressures were monitored on 3 times daily basis and are currently stable on Coreg and Norvasc.  Back incision is C/D/I and is healing well without any signs or symptoms of infection.  He has been afebrile during his stay and respiratory status has been stable.  Protein supplements were added to help with low calorie malnutrition. Follow up CBC showed that ABLA has improved and thrombocytopenia has resolved. CMET revealed abnormal LFTs which have resolved on follow up.   He was started on bowel program to help manage constipation.  Bladder function is improving --he is continent and voiding without difficulty.  Follow-up labs showed prerenal azotemia and he was encouraged to increase p.o. fluid intake.  He underwent CT simulation at [facility] on 7/14 in anticipation of starting XRT next week.  Decadron has been tapered to 2 mg twice daily on 7/15 and this was maintained at discharge.  Steroid-induced hyperglycemia is improving with taper of steroids.  Pain has been reasonably  controlled with as needed use of hydrocodone.  He has made good gains during his rehab stay and requires supervision level at  wheelchair level.  He will continue to receive further follow-up home health PT and OT by [facility] at home after discharge.    Rehab course: During patient's stay in rehab weekly team conferences were held to monitor patient's progress, set goals and discuss barriers to discharge. At admission, patient required mod assist with mobility and max assist with ADL tasks.  He  has had improvement in activity tolerance, balance, postural control as well as ability to compensate for deficits.  He is able to complete ADL tasks with min assist.He requires contact-guard assist to min assist for transfers.  He is able to propel his wheelchair 50 feet at modified independent level dependent being on fatigue.  Hands-on family education completed with son and girlfriend who will provide assistance as needed after discharge    Disposition: Home   Diet: Regular   Special Instructions: 1.  No strenuous activity or driving till cleared by MD. 2.  Limit sweets and starches while on steroids.  Need to drink plenty of fluids. 3. Recommend repeat BMET in 5-7 days.     Discharge Instructions    Ambulatory referral to Physical Medicine Rehab   Complete by: As directed    1-2 weeks transitional care appt     Allergies as of 03/27/2019      Reactions   Lisinopril Swelling   Facial swelling   Cat Hair Extract    eyes swell   Mangifera Indica    MANGO --   Mango Flavor    throat swells with mango   Other    Peanut-containing Drug Products    Pistachio Nut (diagnostic)    hands and feet burn/itch   Sunflower Oil    Sunflower seed allergy      Medication List    STOP taking these medications   HYDROcodone-acetaminophen 10-325 MG tablet Commonly known as: NORCO   HYDROcodone-acetaminophen 5-325 MG tablet Commonly known as: NORCO/VICODIN   oxybutynin 10 MG 24 hr tablet Commonly known as: DITROPAN-XL     TAKE these medications   acetaminophen 325 MG tablet Commonly known as: TYLENOL Take 1-2  tablets (325-650 mg total) by mouth every 4 (four) hours as needed for mild pain.   amLODipine 10 MG tablet Commonly known as: NORVASC TAKE 1 TABLET(10 MG) BY MOUTH DAILY What changed: See the new instructions.   carvedilol 12.5 MG tablet Commonly known as: COREG TAKE 1 TABLET BY MOUTH TWICE DAILY WITH A MEAL What changed: See the new instructions.   cyclobenzaprine 10 MG tablet Commonly known as: FLEXERIL Take 1 tablet (10 mg total) by mouth 3 (three) times daily as needed for muscle spasms.   dexamethasone 2 MG tablet Commonly known as: DECADRON Take 1 tablet (2 mg total) by mouth every 12 (twelve) hours.   gabapentin 300 MG capsule Commonly known as: NEURONTIN Take 1 capsule (300 mg total) by mouth at bedtime.   lidocaine-prilocaine cream Commonly known as: EMLA Apply 1 application topically as needed. What changed: reasons to take this   ondansetron 4 MG tablet Commonly known as: ZOFRAN Take 1 tablet (4 mg total) by mouth every 6 (six) hours as needed for nausea or vomiting. What changed: Another medication with the same name was removed. Continue taking this medication, and follow the directions you see here.   pantoprazole 40 MG tablet Commonly known as: PROTONIX Take 1 tablet (40  mg total) by mouth daily.   polyethylene glycol 17 g packet Commonly known as: MIRALAX / GLYCOLAX Take 17 g by mouth daily.   prochlorperazine 10 MG tablet Commonly known as: COMPAZINE TAKE 1 TABLET BY MOUTH EVERY 6 HOURS AS NEEDED FOR NAUSEA OR VOMITING What changed: See the new instructions.   senna-docusate 8.6-50 MG tablet Commonly known as: Senokot-S Take 2 tablets by mouth at bedtime.   simvastatin 20 MG tablet Commonly known as: [place] 1 TABLET BY MOUTH EVERY DAY      Follow-up Information    Meredith Staggers, MD Follow up.   Specialty: Physical Medicine and Rehabilitation Why: Office will call you with follow up appointment Contact information: [address] [place] [address] [phone]        Earnie Larsson, MD Follow up.   Specialty: Neurosurgery Why: for post op check Contact information: [address] [place] [address] [phone]        [name], MD. Call.   Specialty: Family Medicine Why: for post hospital follow up Contact information: [address] [place] [address] [phone]           Signed: Bary Leriche 03/29/2019, 11:43 PM

## 2019-03-27 NOTE — Discharge Instructions (Signed)
Inpatient Rehab Discharge Instructions  Dennis Fischer Discharge date and time:  03/27/19  Activities/Precautions/ Functional Status: Activity: no lifting, driving, or strenuous exercise till cleared by MD Diet: regular diet Limit sweets/starches while on steroids. Need to drink plenty of fluids.  Wound Care: keep wound clean and dry    Functional status:  ___ No restrictions     ___ Walk up steps independently _X__ 24/7 supervision/assistance   ___ Walk up steps with assistance ___ Intermittent supervision/assistance  ___ Bathe/dress independently ___ Walk with walker     ___ Bathe/dress with assistance ___ Walk Independently    ___ Shower independently ___ Walk with assistance    _X__ Shower with assistance _X__ No alcohol     ___ Return to work/school ________    COMMUNITY REFERRALS UPON DISCHARGE:    Home Health:   PT   OT                       Agency:  [facility]   Phone: [phone]   Medical Equipment/Items Ordered:  Commode                                                       Agency/Supplier:  [employer] @ [phone]   Special Instructions:    My questions have been answered and I understand these instructions. I will adhere to these goals and the provided educational materials after my discharge from the hospital.  Patient/Caregiver Signature _______________________________ Date __________  Clinician Signature _______________________________________ Date __________  Please bring this form and your medication list with you to all your follow-up doctor's appointments.

## 2019-03-27 NOTE — Progress Notes (Signed)
Restful w/o acute distress or discomfort, monitor and assisted

## 2019-03-30 ENCOUNTER — Other Ambulatory Visit: Payer: Self-pay

## 2019-03-30 ENCOUNTER — Telehealth: Payer: Self-pay | Admitting: Radiation Oncology

## 2019-03-30 ENCOUNTER — Ambulatory Visit: Payer: Medicare Other | Attending: Orthopaedic Surgery

## 2019-03-30 ENCOUNTER — Ambulatory Visit
Admission: RE | Admit: 2019-03-30 | Discharge: 2019-03-30 | Disposition: A | Discharge status: Home / Self Care | Payer: Medicare Other | Source: Ambulatory Visit | Attending: Radiation Oncology | Admitting: Radiation Oncology

## 2019-03-30 ENCOUNTER — Telehealth: Payer: Self-pay

## 2019-03-30 ENCOUNTER — Telehealth: Payer: Self-pay | Admitting: *Deleted

## 2019-03-30 DIAGNOSIS — C61 Malignant neoplasm of prostate: Secondary | ICD-10-CM | POA: Diagnosis not present

## 2019-03-30 DIAGNOSIS — Z91010 Allergy to peanuts: Secondary | ICD-10-CM | POA: Diagnosis not present

## 2019-03-30 DIAGNOSIS — Z8042 Family history of malignant neoplasm of prostate: Secondary | ICD-10-CM | POA: Diagnosis not present

## 2019-03-30 DIAGNOSIS — Z87891 Personal history of nicotine dependence: Secondary | ICD-10-CM | POA: Diagnosis not present

## 2019-03-30 DIAGNOSIS — C7952 Secondary malignant neoplasm of bone marrow: Secondary | ICD-10-CM | POA: Diagnosis not present

## 2019-03-30 DIAGNOSIS — Z888 Allergy status to other drugs, medicaments and biological substances status: Secondary | ICD-10-CM | POA: Diagnosis not present

## 2019-03-30 DIAGNOSIS — C7951 Secondary malignant neoplasm of bone: Secondary | ICD-10-CM | POA: Diagnosis not present

## 2019-03-30 DIAGNOSIS — Z7952 Long term (current) use of systemic steroids: Secondary | ICD-10-CM | POA: Diagnosis not present

## 2019-03-30 DIAGNOSIS — I10 Essential (primary) hypertension: Secondary | ICD-10-CM | POA: Diagnosis not present

## 2019-03-30 DIAGNOSIS — Z79899 Other long term (current) drug therapy: Secondary | ICD-10-CM | POA: Diagnosis not present

## 2019-03-30 DIAGNOSIS — Z7982 Long term (current) use of aspirin: Secondary | ICD-10-CM | POA: Diagnosis not present

## 2019-03-30 NOTE — Telephone Encounter (Signed)
CALLED PATIENT TO REMIND OF INJ. FOR 03-31-19 - ARRIVAL TIME - 11:45 AM @ [facility], LVM FOR A RETURN CALL

## 2019-03-30 NOTE — Telephone Encounter (Signed)
Noted patient was discharged from the hospital over the weekend. Phoned patient to inquire about status and ensure he is aware of radiation appointment for today. No answer. Left detailed message with today's treatment appointment time and tomorrow's treatment appointment time plus the xofigo injection appointment. Provided patient with my direct contact number for future needs related to these appointment or transportation issues. Informed L1 this patient is no longer inpatient.

## 2019-03-30 NOTE — Progress Notes (Signed)
Social Work Discharge Note   The overall goal for the admission was met for:   Discharge location: Yes - home with girlfriend and son (here locally for a few days) providing 24/7 assistance.  Length of Stay: Yes - 16 days  Discharge activity level: Yes - minimal assistance  Home/community participation: Yes  Services provided included: MD, RD, PT, OT, RN, Pharmacy and SW  Financial Services: Medicare and Private Insurance: [employer]  Follow-up services arranged: Home Health: PT, OT via [facility], DME: drop arm commode via [facility] and Patient/Family has no preference for HH/DME agencies  Comments (or additional information):   Contact info:  [place], Thurmond Butts @ [phone]  Patient/Family verbalized understanding of follow-up arrangements: Yes  Individual responsible for coordination of the follow-up plan: pt  Confirmed correct DME delivered: Kleber Crean 03/30/2019    Burtis Imhoff

## 2019-03-30 NOTE — Telephone Encounter (Signed)
Received return call from patient and provided appointment information for today. Patient verbalized understanding and had no other questions or concerns.

## 2019-03-30 NOTE — Telephone Encounter (Signed)
Received message from patient stating that he wanted to follow up on appointments. Returned patients call and left a message to call back.

## 2019-03-31 ENCOUNTER — Encounter: Payer: Self-pay | Admitting: Orthopaedic Surgery

## 2019-03-31 ENCOUNTER — Telehealth: Payer: Self-pay

## 2019-03-31 ENCOUNTER — Other Ambulatory Visit: Payer: Self-pay

## 2019-03-31 ENCOUNTER — Ambulatory Visit (INDEPENDENT_AMBULATORY_CARE_PROVIDER_SITE_OTHER): Payer: Medicare Other | Admitting: Orthopaedic Surgery

## 2019-03-31 ENCOUNTER — Ambulatory Visit (HOSPITAL_COMMUNITY)
Admission: RE | Admit: 2019-03-31 | Discharge: 2019-03-31 | Disposition: A | Discharge status: Home / Self Care | Payer: Medicare Other | Source: Ambulatory Visit | Attending: Radiation Oncology | Admitting: Radiation Oncology

## 2019-03-31 ENCOUNTER — Ambulatory Visit
Admission: RE | Admit: 2019-03-31 | Discharge: 2019-03-31 | Disposition: A | Discharge status: Home / Self Care | Payer: Medicare Other | Source: Ambulatory Visit | Attending: Radiation Oncology | Admitting: Radiation Oncology

## 2019-03-31 VITALS — Ht 72.0 in | Wt 188.0 lb

## 2019-03-31 DIAGNOSIS — C7952 Secondary malignant neoplasm of bone marrow: Secondary | ICD-10-CM | POA: Diagnosis not present

## 2019-03-31 DIAGNOSIS — C61 Malignant neoplasm of prostate: Secondary | ICD-10-CM

## 2019-03-31 DIAGNOSIS — I10 Essential (primary) hypertension: Secondary | ICD-10-CM | POA: Diagnosis not present

## 2019-03-31 DIAGNOSIS — C7951 Secondary malignant neoplasm of bone: Secondary | ICD-10-CM

## 2019-03-31 DIAGNOSIS — Z87891 Personal history of nicotine dependence: Secondary | ICD-10-CM | POA: Diagnosis not present

## 2019-03-31 DIAGNOSIS — Z8042 Family history of malignant neoplasm of prostate: Secondary | ICD-10-CM | POA: Diagnosis not present

## 2019-03-31 MED ORDER — RADIUM RA 223 DICHLORIDE 30 MCCI/ML IV SOLN
118.2000 | Freq: Once | INTRAVENOUS | Status: AC | PRN
  Administered 2019-03-31: 118.2 via INTRAVENOUS

## 2019-03-31 NOTE — Progress Notes (Signed)
73 year old male return to my office post T7, T8, T9 laminectomy by Dr. Annette Stable for metastatic disease with cord compression.  He had this appointment in May before his MRI scan and for some reason has not returned to see Dr. Annette Stable postop.  Patient's wife was somewhat upset she had to stay outside while patient was in the office I explained to her that this is due to [place] restrictions.  She was admitted to the office.  Patient's incision looks good he had subcuticular closure.  Patient had looked online saw something that looked like a standing walker rate to be upright had something to help hold him from behind and also had handbrakes.  Patient is requesting this.  Currently he can just with the assistance of his wife stand to transfer and not able stand up more than a second or 2.  He states his legs are feeling better he can straighten his legs better and notes improved strength in his legs but cannot stand.  I discussed with the patient to be better off with a 4 prong walker or walker with 5 inch front wheels and that walkers with hand brakes sometimes can get away from him causing him to fall.  Patient's wife will call Dr. Marchelle Folks office to make a postop appointment.  Patient is noticed he is got improvement in his pain and states actually after the surgery had minimal pain if any.  Follow-up here can be PRN.  No charge for patient's visit today.

## 2019-03-31 NOTE — Telephone Encounter (Signed)
First attempt at [place], no answer, left message to return call  Patient Name: Dennis Fischer DOB: Mar 23, 1946 Appointment Date and Time: 04-09-2019 / 1:00 pm  With: Zella Ball first then Dr. Albertina Senegal  Transitional Care Questions   Questions for our staff to ask patients on Transitional care 48 hour phone call:   1. Are you/is patient experiencing any problems since coming home? Are there any questions regarding any aspect of care?   2. Are there any questions regarding medications administration/dosing? Are meds being taken as prescribed? Patient should review meds with caller to confirm   3. Have there been any falls?   4. Has Home Health been to the house and/or have they contacted you? If not, have you tried to contact them? Can we help you contact them?   5. Are bowels and bladder emptying properly? Are there any unexpected incontinence issues? If applicable, is patient following bowel/bladder programs?   6. Any fevers, problems with breathing, unexpected pain?   7. Are there any skin problems or new areas of breakdown?   8. Has the patient/family member arranged specialty MD follow up (ie cardiology/neurology/renal/surgical/etc)? Can we help arrange?   9. Does the patient need any other services or support that we can help arrange?   10. Are caregivers following through as expected in assisting the patient?   11. Has the patient quit smoking, drinking alcohol, or using drugs as recommended?     [place] [phone]

## 2019-03-31 NOTE — Progress Notes (Signed)
  Radiation Oncology         (336) [phone] ________________________________  Name: Dennis Fischer MRN: [id number]  Date: 03/31/2019  DOB: [birth year]  Radium-223 Infusion Note  Diagnosis:  Castration resistant prostate cancer with painful bone involvement  Current Infusion:    4  Planned Infusions:  6  Narrative: [name] presented to nuclear medicine for treatment. His most recent blood counts were reviewed.  He remains a good candidate to proceed with Ra-223.  The patient was situated in an infusion suite with a contact barrier placed under his arm. Intravenous access was established, using sterile technique, and a normal saline infusion from a syringe was started.  Micro-dosimetry:  The prescribed radiation activity was assayed and confirmed to be within specified tolerance.  Special Treatment Procedure - Infusion:  The nuclear medicine technologist and I personally verified the dose activity to be delivered as specified in the written directive, and verified the patient identification via 2 separate methods.  The syringe containing the dose was attached to an intravenous access and the dose delivered over a minute. No complications were noted.  The total administered dose was 121.2 microcuries.   A saline flush of the line and the syringe that contained the isotope was then performed.  The residual radioactivity in the syringe was 3.02 microcuries.   Pressure was applied to the venipuncture site, and a compression bandage placed.   Radiation Safety personnel were present to perform the discharge survey, as detailed on their documentation.   After a short period of observation, the patient had his IV removed.  Impression:  The patient tolerated his infusion relatively well.  Plan:  The patient will return in one month for ongoing care.    ________________________________  Sheral Apley. Tammi Klippel, M.D.

## 2019-04-01 ENCOUNTER — Other Ambulatory Visit: Payer: Self-pay

## 2019-04-01 ENCOUNTER — Ambulatory Visit
Admission: RE | Admit: 2019-04-01 | Discharge: 2019-04-01 | Disposition: A | Discharge status: Home / Self Care | Payer: Medicare Other | Source: Ambulatory Visit | Attending: Radiation Oncology | Admitting: Radiation Oncology

## 2019-04-01 DIAGNOSIS — Z87891 Personal history of nicotine dependence: Secondary | ICD-10-CM | POA: Diagnosis not present

## 2019-04-01 DIAGNOSIS — C7952 Secondary malignant neoplasm of bone marrow: Secondary | ICD-10-CM | POA: Diagnosis not present

## 2019-04-01 DIAGNOSIS — C61 Malignant neoplasm of prostate: Secondary | ICD-10-CM | POA: Diagnosis not present

## 2019-04-01 DIAGNOSIS — I10 Essential (primary) hypertension: Secondary | ICD-10-CM | POA: Diagnosis not present

## 2019-04-01 DIAGNOSIS — C7951 Secondary malignant neoplasm of bone: Secondary | ICD-10-CM | POA: Diagnosis not present

## 2019-04-01 DIAGNOSIS — Z8042 Family history of malignant neoplasm of prostate: Secondary | ICD-10-CM | POA: Diagnosis not present

## 2019-04-02 ENCOUNTER — Ambulatory Visit
Admission: RE | Admit: 2019-04-02 | Discharge: 2019-04-02 | Disposition: A | Discharge status: Home / Self Care | Payer: Medicare Other | Source: Ambulatory Visit | Attending: Radiation Oncology | Admitting: Radiation Oncology

## 2019-04-02 ENCOUNTER — Other Ambulatory Visit: Payer: Self-pay

## 2019-04-02 DIAGNOSIS — C7951 Secondary malignant neoplasm of bone: Secondary | ICD-10-CM | POA: Diagnosis not present

## 2019-04-02 DIAGNOSIS — C61 Malignant neoplasm of prostate: Secondary | ICD-10-CM | POA: Diagnosis not present

## 2019-04-02 DIAGNOSIS — C7952 Secondary malignant neoplasm of bone marrow: Secondary | ICD-10-CM | POA: Diagnosis not present

## 2019-04-02 DIAGNOSIS — Z87891 Personal history of nicotine dependence: Secondary | ICD-10-CM | POA: Diagnosis not present

## 2019-04-02 DIAGNOSIS — I10 Essential (primary) hypertension: Secondary | ICD-10-CM | POA: Diagnosis not present

## 2019-04-02 DIAGNOSIS — Z8042 Family history of malignant neoplasm of prostate: Secondary | ICD-10-CM | POA: Diagnosis not present

## 2019-04-03 ENCOUNTER — Telehealth: Payer: Self-pay | Admitting: *Deleted

## 2019-04-03 ENCOUNTER — Other Ambulatory Visit (HOSPITAL_COMMUNITY): Payer: Self-pay | Admitting: Radiation Oncology

## 2019-04-03 ENCOUNTER — Ambulatory Visit
Admission: RE | Admit: 2019-04-03 | Discharge: 2019-04-03 | Disposition: A | Discharge status: Home / Self Care | Payer: Medicare Other | Source: Ambulatory Visit | Attending: Radiation Oncology | Admitting: Radiation Oncology

## 2019-04-03 ENCOUNTER — Other Ambulatory Visit: Payer: Self-pay

## 2019-04-03 DIAGNOSIS — C7951 Secondary malignant neoplasm of bone: Secondary | ICD-10-CM | POA: Diagnosis not present

## 2019-04-03 DIAGNOSIS — I10 Essential (primary) hypertension: Secondary | ICD-10-CM | POA: Diagnosis not present

## 2019-04-03 DIAGNOSIS — C61 Malignant neoplasm of prostate: Secondary | ICD-10-CM

## 2019-04-03 DIAGNOSIS — C7952 Secondary malignant neoplasm of bone marrow: Secondary | ICD-10-CM | POA: Diagnosis not present

## 2019-04-03 DIAGNOSIS — Z8042 Family history of malignant neoplasm of prostate: Secondary | ICD-10-CM | POA: Diagnosis not present

## 2019-04-03 DIAGNOSIS — Z87891 Personal history of nicotine dependence: Secondary | ICD-10-CM | POA: Diagnosis not present

## 2019-04-03 NOTE — Telephone Encounter (Signed)
CALLED PATIENT TO INFORM OF LAB AND WEIGHT ON 04-24-19 @ 12:15 PM @ [facility]. ON 05-01-19 - ARRIVAL TIME- 11:45 AM @ [facility], LVM FOR A RETURN CALL

## 2019-04-06 ENCOUNTER — Ambulatory Visit
Admission: RE | Admit: 2019-04-06 | Discharge: 2019-04-06 | Disposition: A | Discharge status: Home / Self Care | Payer: Medicare Other | Source: Ambulatory Visit | Attending: Radiation Oncology | Admitting: Radiation Oncology

## 2019-04-06 ENCOUNTER — Other Ambulatory Visit: Payer: Self-pay

## 2019-04-06 DIAGNOSIS — C7952 Secondary malignant neoplasm of bone marrow: Secondary | ICD-10-CM | POA: Diagnosis not present

## 2019-04-06 DIAGNOSIS — C61 Malignant neoplasm of prostate: Secondary | ICD-10-CM | POA: Diagnosis not present

## 2019-04-06 DIAGNOSIS — C7951 Secondary malignant neoplasm of bone: Secondary | ICD-10-CM | POA: Diagnosis not present

## 2019-04-06 DIAGNOSIS — I10 Essential (primary) hypertension: Secondary | ICD-10-CM | POA: Diagnosis not present

## 2019-04-06 DIAGNOSIS — Z87891 Personal history of nicotine dependence: Secondary | ICD-10-CM | POA: Diagnosis not present

## 2019-04-06 DIAGNOSIS — Z8042 Family history of malignant neoplasm of prostate: Secondary | ICD-10-CM | POA: Diagnosis not present

## 2019-04-07 ENCOUNTER — Other Ambulatory Visit: Payer: Self-pay

## 2019-04-07 ENCOUNTER — Ambulatory Visit
Admission: RE | Admit: 2019-04-07 | Discharge: 2019-04-07 | Disposition: A | Discharge status: Home / Self Care | Payer: Medicare Other | Source: Ambulatory Visit | Attending: Radiation Oncology | Admitting: Radiation Oncology

## 2019-04-07 DIAGNOSIS — C61 Malignant neoplasm of prostate: Secondary | ICD-10-CM | POA: Diagnosis not present

## 2019-04-07 DIAGNOSIS — Z8042 Family history of malignant neoplasm of prostate: Secondary | ICD-10-CM | POA: Diagnosis not present

## 2019-04-07 DIAGNOSIS — Z87891 Personal history of nicotine dependence: Secondary | ICD-10-CM | POA: Diagnosis not present

## 2019-04-07 DIAGNOSIS — C7952 Secondary malignant neoplasm of bone marrow: Secondary | ICD-10-CM | POA: Diagnosis not present

## 2019-04-07 DIAGNOSIS — I10 Essential (primary) hypertension: Secondary | ICD-10-CM | POA: Diagnosis not present

## 2019-04-07 DIAGNOSIS — C7951 Secondary malignant neoplasm of bone: Secondary | ICD-10-CM | POA: Diagnosis not present

## 2019-04-08 ENCOUNTER — Other Ambulatory Visit: Payer: Self-pay

## 2019-04-08 ENCOUNTER — Ambulatory Visit
Admission: RE | Admit: 2019-04-08 | Discharge: 2019-04-08 | Disposition: A | Discharge status: Home / Self Care | Payer: Medicare Other | Source: Ambulatory Visit | Attending: Radiation Oncology | Admitting: Radiation Oncology

## 2019-04-08 DIAGNOSIS — I10 Essential (primary) hypertension: Secondary | ICD-10-CM | POA: Diagnosis not present

## 2019-04-08 DIAGNOSIS — Z87891 Personal history of nicotine dependence: Secondary | ICD-10-CM | POA: Diagnosis not present

## 2019-04-08 DIAGNOSIS — Z8042 Family history of malignant neoplasm of prostate: Secondary | ICD-10-CM | POA: Diagnosis not present

## 2019-04-08 DIAGNOSIS — C7952 Secondary malignant neoplasm of bone marrow: Secondary | ICD-10-CM | POA: Diagnosis not present

## 2019-04-08 DIAGNOSIS — C7951 Secondary malignant neoplasm of bone: Secondary | ICD-10-CM | POA: Diagnosis not present

## 2019-04-08 DIAGNOSIS — C61 Malignant neoplasm of prostate: Secondary | ICD-10-CM | POA: Diagnosis not present

## 2019-04-09 ENCOUNTER — Ambulatory Visit
Admission: RE | Admit: 2019-04-09 | Discharge: 2019-04-09 | Disposition: A | Discharge status: Home / Self Care | Payer: Medicare Other | Source: Ambulatory Visit | Attending: Radiation Oncology | Admitting: Radiation Oncology

## 2019-04-09 ENCOUNTER — Encounter: Payer: Medicare Other | Attending: Registered Nurse | Admitting: Registered Nurse

## 2019-04-09 ENCOUNTER — Other Ambulatory Visit: Payer: Self-pay

## 2019-04-09 DIAGNOSIS — Z8042 Family history of malignant neoplasm of prostate: Secondary | ICD-10-CM | POA: Diagnosis not present

## 2019-04-09 DIAGNOSIS — C61 Malignant neoplasm of prostate: Secondary | ICD-10-CM | POA: Diagnosis not present

## 2019-04-09 DIAGNOSIS — Z87891 Personal history of nicotine dependence: Secondary | ICD-10-CM | POA: Diagnosis not present

## 2019-04-09 DIAGNOSIS — I10 Essential (primary) hypertension: Secondary | ICD-10-CM | POA: Diagnosis not present

## 2019-04-09 DIAGNOSIS — C7952 Secondary malignant neoplasm of bone marrow: Secondary | ICD-10-CM | POA: Diagnosis not present

## 2019-04-09 DIAGNOSIS — C7951 Secondary malignant neoplasm of bone: Secondary | ICD-10-CM | POA: Diagnosis not present

## 2019-04-10 ENCOUNTER — Encounter: Payer: Self-pay | Admitting: Radiation Oncology

## 2019-04-10 ENCOUNTER — Ambulatory Visit
Admission: RE | Admit: 2019-04-10 | Discharge: 2019-04-10 | Disposition: A | Discharge status: Home / Self Care | Payer: Medicare Other | Source: Ambulatory Visit | Attending: Radiation Oncology | Admitting: Radiation Oncology

## 2019-04-10 ENCOUNTER — Other Ambulatory Visit: Payer: Self-pay

## 2019-04-10 DIAGNOSIS — C7951 Secondary malignant neoplasm of bone: Secondary | ICD-10-CM | POA: Diagnosis not present

## 2019-04-10 DIAGNOSIS — Z87891 Personal history of nicotine dependence: Secondary | ICD-10-CM | POA: Diagnosis not present

## 2019-04-10 DIAGNOSIS — Z8042 Family history of malignant neoplasm of prostate: Secondary | ICD-10-CM | POA: Diagnosis not present

## 2019-04-10 DIAGNOSIS — C7952 Secondary malignant neoplasm of bone marrow: Secondary | ICD-10-CM | POA: Diagnosis not present

## 2019-04-10 DIAGNOSIS — C61 Malignant neoplasm of prostate: Secondary | ICD-10-CM | POA: Diagnosis not present

## 2019-04-10 DIAGNOSIS — I10 Essential (primary) hypertension: Secondary | ICD-10-CM | POA: Diagnosis not present

## 2019-04-13 ENCOUNTER — Ambulatory Visit: Payer: Medicare Other

## 2019-04-13 ENCOUNTER — Telehealth: Payer: Self-pay | Admitting: Oncology

## 2019-04-13 ENCOUNTER — Other Ambulatory Visit: Payer: Medicare Other

## 2019-04-13 NOTE — Telephone Encounter (Signed)
Scheduled appt per 8/02 sch message - unable to reach pt . Left message with appt date and time

## 2019-04-21 ENCOUNTER — Telehealth: Payer: Self-pay

## 2019-04-21 NOTE — Telephone Encounter (Signed)
A woman who did not leave her name called and left a message on the triage line and stated that she would like the patient to be re admitted into rehab. Transitional Care appointment was no showed on 04/09/2019.

## 2019-04-22 ENCOUNTER — Telehealth: Payer: Self-pay | Admitting: Orthopaedic Surgery

## 2019-04-22 ENCOUNTER — Other Ambulatory Visit: Payer: Self-pay | Admitting: Radiation Oncology

## 2019-04-22 DIAGNOSIS — C61 Malignant neoplasm of prostate: Secondary | ICD-10-CM

## 2019-04-22 DIAGNOSIS — C7951 Secondary malignant neoplasm of bone: Secondary | ICD-10-CM

## 2019-04-22 DIAGNOSIS — C7952 Secondary malignant neoplasm of bone marrow: Secondary | ICD-10-CM

## 2019-04-22 NOTE — Telephone Encounter (Signed)
Patient's wife [name] called advised patient need to be admitted back into the hospital because patient has neuropathy in his feet and can not stand up and walk. [name] said patient can not sit up in the bed by himself. [name] is patient is yelling from the pain in his legs and feet. [name] said patient is having a lot of nerve pain from his knee down to his feet. The number to contact [name] is [phone]

## 2019-04-22 NOTE — Telephone Encounter (Signed)
I called and left message for Dennis Fischer.  She needs to call Dr. Annette Stable or the oncologist to discuss he is following him.FYI

## 2019-04-22 NOTE — Telephone Encounter (Signed)
Please advise 

## 2019-04-22 NOTE — Telephone Encounter (Signed)
Left message for patient to call back. According to chart patient no showed for Transitional Care appt. Patient would need an appt or see PCP.

## 2019-04-23 ENCOUNTER — Telehealth: Payer: Self-pay | Admitting: *Deleted

## 2019-04-23 NOTE — Telephone Encounter (Signed)
noted 

## 2019-04-23 NOTE — Telephone Encounter (Signed)
Called patient to remind of lab and weight on 04-24-19 @ 12:15 pm @ [facility], spoke with patient and he is aware of these appts.

## 2019-04-24 ENCOUNTER — Other Ambulatory Visit: Payer: Self-pay

## 2019-04-24 ENCOUNTER — Ambulatory Visit: Payer: Medicare Other | Admitting: Family Medicine

## 2019-04-24 ENCOUNTER — Telehealth: Payer: Self-pay

## 2019-04-24 ENCOUNTER — Ambulatory Visit
Admission: RE | Admit: 2019-04-24 | Discharge: 2019-04-24 | Disposition: A | Discharge status: Home / Self Care | Payer: Medicare Other | Source: Ambulatory Visit | Attending: Radiation Oncology | Admitting: Radiation Oncology

## 2019-04-24 ENCOUNTER — Inpatient Hospital Stay: Payer: Medicare Other | Attending: Oncology

## 2019-04-24 DIAGNOSIS — K5900 Constipation, unspecified: Secondary | ICD-10-CM | POA: Insufficient documentation

## 2019-04-24 DIAGNOSIS — Z8042 Family history of malignant neoplasm of prostate: Secondary | ICD-10-CM | POA: Insufficient documentation

## 2019-04-24 DIAGNOSIS — Z7952 Long term (current) use of systemic steroids: Secondary | ICD-10-CM | POA: Insufficient documentation

## 2019-04-24 DIAGNOSIS — C61 Malignant neoplasm of prostate: Secondary | ICD-10-CM | POA: Diagnosis not present

## 2019-04-24 DIAGNOSIS — Z923 Personal history of irradiation: Secondary | ICD-10-CM | POA: Diagnosis not present

## 2019-04-24 DIAGNOSIS — Z91010 Allergy to peanuts: Secondary | ICD-10-CM | POA: Diagnosis not present

## 2019-04-24 DIAGNOSIS — Z452 Encounter for adjustment and management of vascular access device: Secondary | ICD-10-CM | POA: Diagnosis not present

## 2019-04-24 DIAGNOSIS — Z79899 Other long term (current) drug therapy: Secondary | ICD-10-CM | POA: Insufficient documentation

## 2019-04-24 DIAGNOSIS — D630 Anemia in neoplastic disease: Secondary | ICD-10-CM | POA: Diagnosis not present

## 2019-04-24 DIAGNOSIS — C7952 Secondary malignant neoplasm of bone marrow: Secondary | ICD-10-CM | POA: Diagnosis not present

## 2019-04-24 DIAGNOSIS — R627 Adult failure to thrive: Secondary | ICD-10-CM | POA: Diagnosis not present

## 2019-04-24 DIAGNOSIS — Z888 Allergy status to other drugs, medicaments and biological substances status: Secondary | ICD-10-CM | POA: Insufficient documentation

## 2019-04-24 DIAGNOSIS — Z7982 Long term (current) use of aspirin: Secondary | ICD-10-CM | POA: Diagnosis not present

## 2019-04-24 DIAGNOSIS — I10 Essential (primary) hypertension: Secondary | ICD-10-CM | POA: Diagnosis not present

## 2019-04-24 DIAGNOSIS — C7951 Secondary malignant neoplasm of bone: Secondary | ICD-10-CM | POA: Insufficient documentation

## 2019-04-24 DIAGNOSIS — Z87891 Personal history of nicotine dependence: Secondary | ICD-10-CM | POA: Diagnosis not present

## 2019-04-24 DIAGNOSIS — Z9221 Personal history of antineoplastic chemotherapy: Secondary | ICD-10-CM | POA: Insufficient documentation

## 2019-04-24 LAB — CBC WITH DIFFERENTIAL (CANCER CENTER ONLY)
Abs Immature Granulocytes: 0.04 10*3/uL (ref 0.00–0.07)
Basophils Absolute: 0 10*3/uL (ref 0.0–0.1)
Basophils Relative: 0 %
Eosinophils Absolute: 0.1 10*3/uL (ref 0.0–0.5)
Eosinophils Relative: 2 %
HCT: 31.5 % — ABNORMAL LOW (ref 39.0–52.0)
Hemoglobin: 10.2 g/dL — ABNORMAL LOW (ref 13.0–17.0)
Immature Granulocytes: 1 %
Lymphocytes Relative: 14 %
Lymphs Abs: 0.7 10*3/uL (ref 0.7–4.0)
MCH: 30.5 pg (ref 26.0–34.0)
MCHC: 32.4 g/dL (ref 30.0–36.0)
MCV: 94.3 fL (ref 80.0–100.0)
Monocytes Absolute: 0.5 10*3/uL (ref 0.1–1.0)
Monocytes Relative: 9 %
Neutro Abs: 3.6 10*3/uL (ref 1.7–7.7)
Neutrophils Relative %: 74 %
Platelet Count: 251 10*3/uL (ref 150–400)
RBC: 3.34 MIL/uL — ABNORMAL LOW (ref 4.22–5.81)
RDW: 15.5 % (ref 11.5–15.5)
WBC Count: 4.9 10*3/uL (ref 4.0–10.5)
nRBC: 0 % (ref 0.0–0.2)

## 2019-04-24 MED ORDER — HEPARIN SOD (PORK) LOCK FLUSH 100 UNIT/ML IV SOLN
500.0000 [IU] | Freq: Once | INTRAVENOUS | Status: AC
  Administered 2019-04-24: 500 [IU] via INTRAVENOUS
  Filled 2019-04-24: qty 5

## 2019-04-24 MED ORDER — SODIUM CHLORIDE 0.9% FLUSH
10.0000 mL | Freq: Once | INTRAVENOUS | Status: AC
  Administered 2019-04-24: 10 mL via INTRAVENOUS
  Filled 2019-04-24: qty 10

## 2019-04-24 NOTE — Telephone Encounter (Signed)
Received a call from the patient girlfriend requesting that the patient be placed back into inpatient rehab because he is getting weaker and she needs help. Explained that from an outpatient stand point we can put home health PT/OT in place but not inpatient rehab. Dennis Fischer stated that he will not let anyone into the home due to [name] so when home health was ordered at hospital discharge he declined it. Explained that a referral can be placed for social work to assist with community resources. Dennis Fischer stated that she would appreciate that. She would also like to be called to discuss her concerns during the patient MD visit on Monday and explained that would make the MD aware. No other questions or concerns.

## 2019-04-27 ENCOUNTER — Inpatient Hospital Stay (HOSPITAL_BASED_OUTPATIENT_CLINIC_OR_DEPARTMENT_OTHER): Payer: Medicare Other | Admitting: Oncology

## 2019-04-27 ENCOUNTER — Other Ambulatory Visit: Payer: Self-pay

## 2019-04-27 ENCOUNTER — Telehealth: Payer: Self-pay | Admitting: *Deleted

## 2019-04-27 VITALS — BP 98/73 | HR 112 | Temp 98.0°F | Resp 18 | Ht 72.0 in | Wt 169.7 lb

## 2019-04-27 DIAGNOSIS — Z9221 Personal history of antineoplastic chemotherapy: Secondary | ICD-10-CM | POA: Diagnosis not present

## 2019-04-27 DIAGNOSIS — C61 Malignant neoplasm of prostate: Secondary | ICD-10-CM

## 2019-04-27 DIAGNOSIS — C7951 Secondary malignant neoplasm of bone: Secondary | ICD-10-CM | POA: Diagnosis not present

## 2019-04-27 DIAGNOSIS — Z923 Personal history of irradiation: Secondary | ICD-10-CM | POA: Diagnosis not present

## 2019-04-27 DIAGNOSIS — Z452 Encounter for adjustment and management of vascular access device: Secondary | ICD-10-CM | POA: Diagnosis not present

## 2019-04-27 DIAGNOSIS — D630 Anemia in neoplastic disease: Secondary | ICD-10-CM | POA: Diagnosis not present

## 2019-04-27 MED ORDER — MEGESTROL ACETATE 400 MG/10ML PO SUSP: 400.0000 mg | Freq: Two times a day (BID) | ORAL | 0 refills | Status: DC

## 2019-04-27 MED ORDER — SENNOSIDES-DOCUSATE SODIUM 8.6-50 MG PO TABS: 2.0000 | ORAL_TABLET | Freq: Every day | ORAL | 1 refills | Status: DC

## 2019-04-27 NOTE — Progress Notes (Signed)
Referral placed to [facility] per Dr. Alen Blew for Physical Therapy and Wound Care.

## 2019-04-27 NOTE — Progress Notes (Signed)
Hematology and Oncology Follow Up Visit  Dennis Fischer [id number] 01/27/1946 73 y.o. 04/27/2019 8:28 AM Dennis Fischer, MDKim, [name], MD   Principle Diagnosis: 73 year old man with castration-resistant prostate cancer with disease to the bone diagnosed in 2018.      Prior Therapy: He received definitive therapy using radiation therapy and androgen deprivation in 2016.  He developed advanced disease with pelvic adenopathy and was treated with androgen deprivation while was living in [place].  He subsequently developed castration resistant disease.  Zytiga 1000 mg daily restarted on 03/14/2017.  Therapy discontinued in August 2018 due to progression of disease.  Taxotere chemotherapy at 75 mg/m given every 3 weeks started on the first 2019.  He completed 8 cycles of therapy in January 2020.  He is status post epidural compression and decompressive laminectomy completed on March 09, 2019 between T7-T8 and T9 completed by Dr. Trenton Gammon.  He completed additional radiation therapy in the form of 10 fractions for a total of 30 Gy in July 2020.  Current therapy:   Lupron 30 mg every 4 months.    Xofigo started on December 16, 2018.  He is status post 4 infusions the fifth infusion scheduled for next week we will plan to complete 6 treatments.    Interim History:  Dennis Fischer returns today for a repeat evaluation.  Since the last visit, he presented with cord compression that required urgent laminectomy followed by radiation therapy concluded in July 2020.  He did have a stent in acute rehab currently back at home.  Since his discharge, he reports overall decline in his health predominantly generalized weakness, poor p.o. intake and decline in his performance status.  His mobility has been limited and predominantly in a chair or sleeping.  He denies any focal deficits at this time neurologically.  He denies any worsening back pain or leg pain.  He denies any worsening neuropathy.  He has also reported  some constipation and weight loss.   Patient denied any alteration mental status, neuropathy, confusion or dizziness.  Denies any headaches or lethargy.  Denies any night sweats, weight loss or changes in appetite.  Denied orthopnea, dyspnea on exertion or chest discomfort.  Denies shortness of breath, difficulty breathing hemoptysis or cough.  Denies any abdominal distention, nausea, early satiety or dyspepsia.  Denies any hematuria, frequency, dysuria or nocturia.  Denies any skin irritation, dryness or rash.  Denies any ecchymosis or petechiae.  Denies any lymphadenopathy or clotting.  Denies any heat or cold intolerance.  Denies any anxiety or depression.  Remaining review of system is negative.          Medications: Updated without changes. Current Outpatient Medications  Medication Sig Dispense Refill  . acetaminophen (TYLENOL) 325 MG tablet Take 1-2 tablets (325-650 mg total) by mouth every 4 (four) hours as needed for mild pain.    Marland Kitchen amLODipine (NORVASC) 10 MG tablet TAKE 1 TABLET(10 MG) BY MOUTH DAILY (Patient taking differently: Take 10 mg by mouth daily. ) 90 tablet 3  . carvedilol (COREG) 12.5 MG tablet TAKE 1 TABLET BY MOUTH TWICE DAILY WITH A MEAL (Patient taking differently: Take 12.5 mg by mouth 2 (two) times daily with a meal. ) 180 tablet 3  . cyclobenzaprine (FLEXERIL) 10 MG tablet Take 1 tablet (10 mg total) by mouth 3 (three) times daily as needed for muscle spasms. 60 tablet 0  . dexamethasone (DECADRON) 2 MG tablet Take 1 tablet (2 mg total) by mouth every 12 (twelve) hours. [place]  tablet 1  . gabapentin (NEURONTIN) 300 MG capsule Take 1 capsule (300 mg total) by mouth at bedtime. 30 capsule 5  . lidocaine-prilocaine (EMLA) cream Apply 1 application topically as needed. (Patient taking differently: Apply 1 application topically as needed (numbing). ) 30 g 0  . ondansetron (ZOFRAN) 4 MG tablet Take 1 tablet (4 mg total) by mouth every 6 (six) hours as needed for nausea or  vomiting. 20 tablet 0  . pantoprazole (PROTONIX) 40 MG tablet Take 1 tablet (40 mg total) by mouth daily.    . polyethylene glycol (MIRALAX / GLYCOLAX) 17 g packet Take 17 g by mouth daily. 30 each 1  . prochlorperazine (COMPAZINE) 10 MG tablet TAKE 1 TABLET BY MOUTH EVERY 6 HOURS AS NEEDED FOR NAUSEA OR VOMITING (Patient taking differently: Take 10 mg by mouth every 6 (six) hours as needed for nausea or vomiting. ) 368 tablet 3  . senna-docusate (SENOKOT-S) 8.6-50 MG tablet Take 2 tablets by mouth at bedtime. 60 tablet 1  . simvastatin (ZOCOR) 20 MG tablet TAKE 1 TABLET BY MOUTH EVERY DAY 90 tablet 3   No current facility-administered medications for this visit.      Allergies:  Allergies  Allergen Reactions  . Lisinopril Swelling    Facial swelling  . Cat Hair Extract     eyes swell  . Mangifera Indica     MANGO --  . Mango Flavor     throat swells with mango  . Other   . Peanut-Containing Drug Products   . Pistachio Nut (Diagnostic)     hands and feet burn/itch  . Sunflower Oil     Sunflower seed allergy    Past Medical History, Surgical history, Social history, and Family History remained without any changes on review.    Physical Exam:   Blood pressure 98/73, pulse (!) 112, temperature 98 F (36.7 C), temperature source Temporal, resp. rate 18, height 6' (1.829 m), weight 169 lb 11.2 oz (77 kg), SpO2 99 %.     ECOG: 2    General appearance: Alert, awake without any distress.  Head: Atraumatic without abnormalities Oropharynx: Without any thrush or ulcers. Eyes: No scleral icterus. Lymph nodes: No lymphadenopathy noted in the cervical, supraclavicular, or axillary nodes Heart:regular rate and rhythm, without any murmurs or gallops.   Lung: Clear to auscultation without any rhonchi, wheezes or dullness to percussion. Abdomin: Soft, nontender without any shifting dullness or ascites. Musculoskeletal: No clubbing or cyanosis. Neurological: No motor or sensory  deficits.  Able to move all extremities. Skin: No rashes or lesions. Psychiatric: Mood and affect appeared normal.            Lab Results: Lab Results  Component Value Date   WBC 4.9 04/24/2019   HGB 10.2 (L) 04/24/2019   HCT 31.5 (L) 04/24/2019   MCV 94.3 04/24/2019   PLT 251 04/24/2019     Chemistry      Component Value Date/Time   NA 136 03/25/2019 0335   NA 141 08/08/2017 1116   K 4.4 03/25/2019 0335   K 3.6 08/08/2017 1116   CL 105 03/25/2019 0335   CO2 21 (L) 03/25/2019 0335   CO2 24 08/08/2017 1116   BUN 25 (H) 03/25/2019 0335   BUN 13.2 08/08/2017 1116   CREATININE 0.90 03/25/2019 0335   CREATININE 1.11 01/13/2019 1057   CREATININE 1.1 08/08/2017 1116      Component Value Date/Time   CALCIUM 8.1 (L) 03/25/2019 0335   CALCIUM 9.1 08/08/2017  1116   ALKPHOS 79 03/25/2019 0335   ALKPHOS 214 (H) 08/08/2017 1116   AST 18 03/25/2019 0335   AST 14 (L) 01/13/2019 1057   AST 17 08/08/2017 1116   ALT 40 03/25/2019 0335   ALT 14 01/13/2019 1057   ALT 17 08/08/2017 1116   BILITOT 0.4 03/25/2019 0335   BILITOT <0.2 (L) 01/13/2019 1057   BILITOT 0.29 08/08/2017 1116                Impression and Plan:  73 year old man with:   1.  Advanced prostate cancer with disease to the bone currently castration-resistant.   He has completed 4 months of Xofigo with plan to complete 6 treatments.  The natural course of his disease was reviewed and future treatment options were reiterated.  He has progressed on multiple therapies outlined above and potentially will require different salvage therapy after Xofigo.  His options would include systemic chemotherapy versus a PARP inhibitor if he harbors an appropriate mutation.  I will obtain next generation sequencing studies on his recent surgery specimen to determine the next best course of action.  We have also discussed the role of supportive care attentionally hospice care if he has experienced further  decline.    2. Androgen depravation: Last injection was given in February 1594 and he is certainly due for Lupron.  Long-term complication associated with this therapy including osteoporosis and hot flashes.  This will be given on the next visit.   3.  Anemia: Related to malignancy and previous chemotherapy.  Hemoglobin is adequate at this time.  4.  Goals of care: Therapy is palliative at this time.  His disease appears to be progressive and his performance status may be declining.  His prognosis is guarded and aggressive therapy may still be desired but likely will be poorly tolerated.  5.  Bone directed therapy: I recommended continuing calcium and vitamin D supplements with consideration for Xgeva after obtaining dental clearance.  6.  IV access: Port-A-Cath remains in place and will be flushed periodically.  7.  Constipation: Prescription for Senokot-S will be made available to him.  8.  Debilitation and failure to thrive: We will make a referral to home health agency for physical therapy.  9.  Poor p.o. intake: Prescription for Megace will be made available to him.  10. Follow-up: Will be the next 3 to 4 weeks for follow-up.  25  minutes was spent with the patient face-to-face today.  More than 50% of time was discussing the natural course of his disease, potential treatment options and overall prognosis.   Zola Button, MD 8/17/20208:28 AM

## 2019-04-27 NOTE — Telephone Encounter (Signed)
[place] Work  Clinical Social Work was referred by medical oncology RN for assessment of psychosocial needs.  Clinical Social Worker contacted patient by phone to offer support and assess for needs.  Dennis Fischer reported he just left cancer center after appointment with medical oncologist.  He shared Dr. Alen Blew has ordered Hospice care at home.  CSW explored patients feelings regarding change in care and discussed benefits of Hospice. Patient receives CNA home care services through [place], 3 hours/day.  CSW encouraged patient[facility]caregiver to [facility] to determine if [facility] could get additional hours if reassessed for need.  Patient and caregiver expressed understanding, appreciated CSW phone call.   Gwinda Maine, LCSW  Clinical Social Worker [facility]

## 2019-04-28 ENCOUNTER — Other Ambulatory Visit: Payer: Self-pay | Admitting: Oncology

## 2019-04-28 MED ORDER — HYDROCODONE-ACETAMINOPHEN 5-325 MG PO TABS: 1.0000 | ORAL_TABLET | Freq: Four times a day (QID) | ORAL | 0 refills | Status: DC | PRN

## 2019-04-29 ENCOUNTER — Other Ambulatory Visit: Payer: Self-pay

## 2019-04-29 ENCOUNTER — Telehealth: Payer: Self-pay | Admitting: Oncology

## 2019-04-29 DIAGNOSIS — K5900 Constipation, unspecified: Secondary | ICD-10-CM | POA: Diagnosis not present

## 2019-04-29 DIAGNOSIS — Z7952 Long term (current) use of systemic steroids: Secondary | ICD-10-CM | POA: Diagnosis not present

## 2019-04-29 DIAGNOSIS — D630 Anemia in neoplastic disease: Secondary | ICD-10-CM | POA: Diagnosis not present

## 2019-04-29 DIAGNOSIS — R5381 Other malaise: Secondary | ICD-10-CM | POA: Diagnosis not present

## 2019-04-29 DIAGNOSIS — C61 Malignant neoplasm of prostate: Secondary | ICD-10-CM | POA: Diagnosis not present

## 2019-04-29 DIAGNOSIS — M6281 Muscle weakness (generalized): Secondary | ICD-10-CM | POA: Diagnosis not present

## 2019-04-29 DIAGNOSIS — C7951 Secondary malignant neoplasm of bone: Secondary | ICD-10-CM | POA: Diagnosis not present

## 2019-04-29 DIAGNOSIS — R627 Adult failure to thrive: Secondary | ICD-10-CM | POA: Diagnosis not present

## 2019-04-29 NOTE — Progress Notes (Signed)
Error

## 2019-04-29 NOTE — Telephone Encounter (Signed)
Called and spoke with patient. Confirmed sept appts but would still like a printout. Mailed calendar

## 2019-04-30 ENCOUNTER — Telehealth: Payer: Self-pay | Admitting: *Deleted

## 2019-04-30 NOTE — Telephone Encounter (Signed)
CALLED PATIENT TO REMIND OF XOFIGO INJ. FOR 05-01-19 - ARRIVAL TIME- 11:45 AM @ [facility], SPOKE WITH PATIENT AND HE IS AWARE OF THIS INJ.

## 2019-05-01 ENCOUNTER — Ambulatory Visit (HOSPITAL_COMMUNITY): Admission: RE | Admit: 2019-05-01 | Payer: Medicare Other | Source: Ambulatory Visit

## 2019-05-03 ENCOUNTER — Encounter: Payer: Self-pay | Admitting: Radiation Oncology

## 2019-05-03 NOTE — Progress Notes (Signed)
  Radiation Oncology         [phone] ________________________________  Name: Dennis Fischer MRN: [id number]  Date: 05/03/2019  DOB: [birth year]  End of Treatment Note  Diagnosis:    73 y.o. patient s/p laminectomy for thoracic spinal metastasis from prostate cancer    Indication for treatment:  Palliative       Radiation treatment dates:   7/20-31/20  Site/dose:   T7-T9 was treated to 30 Gy in 10 fractions  Beams/energy:   A 4-field technique with AP/PA and two posterior obliques was used with 6X fields for a 3D plan  Narrative: The patient tolerated radiation treatment relatively well.   He did not experience any notable effects.  Plan: The patient has completed radiation treatment. The patient will return to radiation oncology clinic for routine followup in one month. I advised him to call or return sooner if he has any questions or concerns related to his recovery or treatment. ________________________________  Sheral Apley. Tammi Klippel, M.D.

## 2019-05-04 ENCOUNTER — Telehealth: Payer: Self-pay

## 2019-05-04 NOTE — Telephone Encounter (Signed)
Received call from [facility] PT requesting visits: 1x/week for 1 week, 2x/week for 6 weeks, and 1x/week for 2 weeks. Called back and verbal order provided for [facility] PT visit string.

## 2019-05-13 ENCOUNTER — Ambulatory Visit
Admission: RE | Admit: 2019-05-13 | Discharge: 2019-05-13 | Disposition: A | Discharge status: Home / Self Care | Payer: Medicare Other | Source: Ambulatory Visit | Attending: Urology | Admitting: Urology

## 2019-05-13 ENCOUNTER — Encounter: Payer: Self-pay | Admitting: Radiation Oncology

## 2019-05-13 ENCOUNTER — Encounter: Payer: Self-pay | Admitting: Family Medicine

## 2019-05-13 ENCOUNTER — Ambulatory Visit (INDEPENDENT_AMBULATORY_CARE_PROVIDER_SITE_OTHER): Payer: Medicare Other | Admitting: Family Medicine

## 2019-05-13 ENCOUNTER — Other Ambulatory Visit: Payer: Self-pay

## 2019-05-13 DIAGNOSIS — K5900 Constipation, unspecified: Secondary | ICD-10-CM | POA: Diagnosis not present

## 2019-05-13 DIAGNOSIS — Z23 Encounter for immunization: Secondary | ICD-10-CM

## 2019-05-13 DIAGNOSIS — C7951 Secondary malignant neoplasm of bone: Secondary | ICD-10-CM

## 2019-05-13 NOTE — Progress Notes (Signed)
Radiation Oncology         [phone] ________________________________  Name: Dennis Fischer MRN: [id number]  Date: 05/13/2019  DOB: 1946-07-22  Post Treatment Note  CC: [name], MD  [name], MD  Diagnosis:   73 y.o. 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  02/02/2015 - 04/04/2015: Prostate (+boost) / 45 Gy in 25 fractions (+30.6 Gy in 17 fractions) (Dr. Luiz Ochoa at [facility])  Narrative:  The patient returns today for routine follow-up.  He tolerated radiation treatment relatively well and did not experience any notable ill side effects.                              On review of systems, the patient states that he is doing well overall.  He has not had any further mid-low back pain and reports a slow but gradual improvement in the lower extremity weakness.  He continues working with physical therapy at home.  He denies any bowel or bladder dysfunction.  He reports a healthy appetite and is maintaining his weight.  He denies abdominal pain, nausea, vomiting, diarrhea or constipation.  ALLERGIES:  is allergic to lisinopril; cat hair extract; mangifera indica; mango flavor; other; peanut-containing drug products; pistachio nut (diagnostic); and sunflower oil.  Meds: Current Outpatient Medications  Medication Sig Dispense Refill  . acetaminophen (TYLENOL) 325 MG tablet Take 1-2 tablets (325-650 mg total) by mouth every 4 (four) hours as needed for mild pain.    Marland Kitchen amLODipine (NORVASC) 10 MG tablet TAKE 1 TABLET(10 MG) BY MOUTH DAILY (Patient taking differently: Take 10 mg by mouth daily. ) 90 tablet 3   . carvedilol (COREG) 12.5 MG tablet TAKE 1 TABLET BY MOUTH TWICE DAILY WITH A MEAL (Patient taking differently: Take 12.5 mg by mouth 2 (two) times daily with a meal. ) 180 tablet 3  . cyclobenzaprine (FLEXERIL) 10 MG tablet Take 1 tablet (10 mg total) by mouth 3 (three) times daily as needed for muscle spasms. 60 tablet 0  . dexamethasone (DECADRON) 2 MG tablet Take 1 tablet (2 mg total) by mouth every 12 (twelve) hours. 60 tablet 1  . gabapentin (NEURONTIN) 300 MG capsule Take 1 capsule (300 mg total) by mouth at bedtime. 30 capsule 5  . HYDROcodone-acetaminophen (NORCO) 5-325 MG tablet Take 1 tablet by mouth every 6 (six) hours as needed for moderate pain. 30 tablet 0  . lidocaine-prilocaine (EMLA) cream Apply 1 application topically as needed. (Patient taking differently: Apply 1 application topically as needed (numbing). ) 30 g 0  . megestrol (MEGACE) 400 MG/10ML suspension Take 10 mLs (400 mg total) by mouth 2 (two) times daily. 240 mL 0  . ondansetron (ZOFRAN) 4 MG tablet Take 1 tablet (4 mg total) by mouth every 6 (six) hours as needed for nausea or vomiting. 20 tablet 0  . pantoprazole (PROTONIX) 40 MG tablet Take 1 tablet (40 mg total) by mouth daily.    . polyethylene glycol (MIRALAX / GLYCOLAX) 17 g packet Take 17 g by mouth daily. 30 each 1  . prochlorperazine (COMPAZINE) 10 MG tablet TAKE 1 TABLET  BY MOUTH EVERY 6 HOURS AS NEEDED FOR NAUSEA OR VOMITING (Patient taking differently: Take 10 mg by mouth every 6 (six) hours as needed for nausea or vomiting. ) 368 tablet 3  . senna-docusate (SENOKOT-S) 8.6-50 MG tablet Take 2 tablets by mouth at bedtime. 60 tablet 1  . simvastatin (ZOCOR) 20 MG tablet TAKE 1 TABLET BY MOUTH EVERY DAY 90 tablet 3   No current facility-administered medications for this encounter.     Physical Findings:  vitals were not taken for this visit.   / In general this is a well appearing African-American male in no acute distress. He's alert and oriented x4 and  appropriate throughout the examination. Cardiopulmonary assessment is negative for acute distress and he exhibits normal effort.   Lab Findings: Lab Results  Component Value Date   WBC 4.9 04/24/2019   HGB 10.2 (L) 04/24/2019   HCT 31.5 (L) 04/24/2019   MCV 94.3 04/24/2019   PLT 251 04/24/2019     Radiographic Findings: No results found.  Impression/Plan: 1. 73 y.o. patient s/p laminectomy for thoracic spinal metastasis from prostate cancer. He appears to have recovered well from the effects of his recent radiotherapy.  He is currently without complaints.  We discussed that while we are happy to continue to participate in his care if clinically indicated, at this point, we will plan to see him back on an as-needed basis.  He will continue in routine follow-up under the care and direction of Dr. Trenton Gammon and Dr. Alen Blew.  He knows to call at anytime with any questions or concerns related to his previous radiotherapy.     Nicholos Johns, PA-C

## 2019-05-13 NOTE — Progress Notes (Signed)
  Established Patient - Acute Visit Patient ID: MRN [id number], [birth year]  PCP: [name], MD  Subjective  CC: Hypertension   [id number] Dennis Fischer is a 73 y.o. male with past medical history s/f prostate cancer with mets to spine with terminal diagnosis who presents today with the following problems:  Needs Flu Shot  HH aide & HH PT at home   Constipation. Last had a BM 3 weeks ago. Been eating normal amount. Sometimes lower appetite. No abdominal pain.  No fever, chills, nausea, vomiting, diarrhea. No recent GI illness. No food allergies. Decreased water intake.    ROS: Pertinent ROS included in HPI.  History: Medications, allergies, medical history, family history and social history were reviewed and edited as necessary.  Social Hx: Dennis Fischer reports that he quit smoking about 4 years ago. His smoking use included cigarettes. He has a 12.50 pack-year smoking history. He has never used smokeless tobacco. He reports current alcohol use. He reports current drug use. Drug: Marijuana.   Objective   Physical Exam:  BP 100/70   Pulse (!) 109   SpO2 95%  There were no vitals filed for this visit. General: NAD, non-toxic, well-appearing, sitting comfortably in his wheel chair   HEENT: [place]/AT. PERRLA. EOMI.  Cardiovascular: RRR, normal S1, S2. B/L 2+ RP. No BLEE Respiratory: CTAB. No IWOB.  Abdomen: + BS. NT, ND, soft to palpation.  Extremities: Warm and well perfused. Patient 1/5 strength in b/l hips and knees. Passively able to move ankles and knees. But quad weakness requiring him to lift his legs from chair. Fingernails with brown and horizontal striping (chemotherapy) Integumentary: No obvious rashes, lesions, trauma on general exam. Neuro: A & O x4. CN grossly intact. No FND  Pertinent Labs & Imaging:  Reviewed.     Assessment  Constipation No red flag symptoms today. Recent chemo and radiation therapy. No signs of obstruction. + BS. Physical exam wnl.  - PEG daily  - increase  fluid intake  - increase fiber intake   Orders Placed This Encounter  Procedures  . Flu Vaccine QUAD High Dose(Fluad)   Follow-up: PRN    [name], M.D.  PGY-2  Family Medicine  [phone] 05/15/2019 9:54 AM

## 2019-05-13 NOTE — Progress Notes (Signed)
Patient did not show for Xofigo injection scheduled for 05/01/2019. Per Romie Jumper patient wishes to cancel any further Xofigo treatments. Per Romie Jumper Dr. Tammi Klippel is aware.

## 2019-05-13 NOTE — Patient Instructions (Addendum)
Dear Dennis Fischer,   It was good to see you! Thank you for taking your time to come in to be seen. Today, we discussed the following:   Constipation   Please take mirilax every day to help with bowel movements. Make sure to drink plenty of water. If you have any pain in your abdomen, please call the office for follow up. If severe pain, please go to the nearest emergency department.  Please follow up for concerning or worsening symptoms.   Be well,   Zettie Cooley, M.D   [facility] [phone]  *Sign up for MyChart for instant access to your health profile, labs, orders, upcoming appointments or to contact your provider with questions*  ===================================================================================  Constipation, Adult Constipation is when a person:  Poops (has a bowel movement) fewer times in a week than normal.  Has a hard time pooping.  Has poop that is dry, hard, or bigger than normal. Follow these instructions at home: Eating and drinking   Eat foods that have a lot of fiber, such as: ? Fresh fruits and vegetables. ? Whole grains. ? Beans.  Eat less of foods that are high in fat, low in fiber, or overly processed, such as: ? [place] fries. ? Hamburgers. ? Cookies. ? Candy. ? Soda.  Drink enough fluid to keep your pee (urine) clear or pale yellow. General instructions  Exercise regularly or as told by your doctor.  Go to the restroom when you feel like you need to poop. Do not hold it in.  Take over-the-counter and prescription medicines only as told by your doctor. These include any fiber supplements.  Do pelvic floor retraining exercises, such as: ? Doing deep breathing while relaxing your lower belly (abdomen). ? Relaxing your pelvic floor while pooping.  Watch your condition for any changes.  Keep all follow-up visits as told by your doctor. This is important. Contact a doctor if:  You have pain that gets  worse.  You have a fever.  You have not pooped for 4 days.  You throw up (vomit).  You are not hungry.  You lose weight.  You are bleeding from the anus.  You have thin, pencil-like poop (stool). Get help right away if:  You have a fever, and your symptoms suddenly get worse.  You leak poop or have blood in your poop.  Your belly feels hard or bigger than normal (is bloated).  You have very bad belly pain.  You feel dizzy or you faint. This information is not intended to replace advice given to you by your health care provider. Make sure you discuss any questions you have with your health care provider. Document Released: 02/13/2008 Document Revised: 08/09/2017 Document Reviewed: 02/15/2016 Elsevier Patient Education  2020 [employer].

## 2019-05-14 ENCOUNTER — Telehealth: Payer: Self-pay | Admitting: Radiation Oncology

## 2019-05-14 NOTE — Telephone Encounter (Signed)
-----   Message from [place], [place] sent at 05/14/2019 11:28 AM EDT ----- Regarding: RE: Trudi Ida Patient He did not say but I also did not push because I had already seen this documentation from 05/13/19:  "Patient did not show for Xofigo injection scheduled for 05/01/2019. Per Romie Jumper patient wishes to cancel any further Xofigo treatments. Per Romie Jumper Dr. Tammi Klippel is aware."  -Ashlyn ----- Message ----- From: Heywood Footman, RN Sent: 05/14/2019  10:31 AM EDT To: Freeman Caldron, PA-C, Kerri Perches Subject: FW: Xofigo Patient                             Ash.  When you saw him yesterday did he happen to mention his intentions regarding Xofigo? I am glad to call if not. Thanks in advance.   Sam ----- Message ----- From: Tyler Pita, MD Sent: 05/13/2019   1:26 PM EDT To: Dorice Lamas, RN Subject: RE: Trudi Ida Patient                             Have Sam call to check ----- Message ----- From: Kerri Perches Sent: 05/13/2019   1:22 PM EDT To: Tyler Pita, MD Subject: Trudi Ida Patient                                 Hey Dr. Tammi Klippel,   The above patient didn't show for his last Xofigo Inj., what you like for me to do?  Let me know.  Thanks,  [employer]

## 2019-05-14 NOTE — Telephone Encounter (Signed)
Phoned patient as requested by Dr. Tammi Klippel to inquire about patient's intentions concerning his fifth and sixth xofigo injections. Patient seemed confused. Wife assisted with clarification as we all spoke over speaker phone. Ultimately, the patient decided to finish out the last two Xofigo treatment. Dennis Fischer understand that Dennis Fischer will be phoning to schedule/coordinate these appointments.

## 2019-05-15 ENCOUNTER — Encounter: Payer: Self-pay | Admitting: Family Medicine

## 2019-05-15 ENCOUNTER — Other Ambulatory Visit: Payer: Self-pay | Admitting: Radiation Oncology

## 2019-05-15 ENCOUNTER — Telehealth: Payer: Self-pay | Admitting: *Deleted

## 2019-05-15 DIAGNOSIS — K5900 Constipation, unspecified: Secondary | ICD-10-CM | POA: Insufficient documentation

## 2019-05-15 DIAGNOSIS — C7951 Secondary malignant neoplasm of bone: Secondary | ICD-10-CM

## 2019-05-15 NOTE — Telephone Encounter (Signed)
CALLED PATIENT TO INFORM OF LAB AND WEIGHT ON 05-26-19 @ 12:15 PM @ [facility]. ON 06-02-19 - ARRIVAL TIME- 11:45 AM @ [facility], SPOKE WITH PATIENT AND WIFE AND THEY AGREED TO THESE APPTS.

## 2019-05-15 NOTE — Assessment & Plan Note (Signed)
No red flag symptoms today. Recent chemo and radiation therapy. No signs of obstruction. + BS. Physical exam wnl.  - PEG daily  - increase fluid intake  - increase fiber intake

## 2019-05-18 NOTE — Addendum Note (Signed)
Encounter addended by: Tyler Pita, MD on: 05/18/2019 3:24 PM  Actions taken: Clinical Note Signed

## 2019-05-18 NOTE — Progress Notes (Signed)
  Radiation Oncology         (336) [phone] ________________________________  Name: Dennis Fischer MRN: [id number]  Date: 01/20/2019  DOB: 1945/10/26  Radium-223 Infusion Note  Diagnosis:  Castration resistant prostate cancer with painful bone involvement  Current Infusion:    2  Planned Infusions:  6  Narrative: Mr. [name] presented to nuclear medicine for treatment. His most recent blood counts were reviewed.  He remains a good candidate to proceed with Ra-223.  The patient was situated in an infusion suite with a contact barrier placed under his arm. Intravenous access was established, using sterile technique, and a normal saline infusion from a syringe was started.  Micro-dosimetry:  The prescribed radiation activity was assayed and confirmed to be within specified tolerance.  Special Treatment Procedure - Infusion:  The nuclear medicine technologist and I personally verified the dose activity to be delivered as specified in the written directive, and verified the patient identification via 2 separate methods.  The syringe containing the dose was attached to an intravenous access and the dose delivered over a minute. No complications were noted.  The total administered dose was 132.7 microcuries.   A saline flush of the line and the syringe that contained the isotope was then performed.  The residual radioactivity in the syringe was 3.14 microcuries.   Pressure was applied to the venipuncture site, and a compression bandage placed.   Radiation Safety personnel were present to perform the discharge survey, as detailed on their documentation.   After a short period of observation, the patient had his IV removed.  Impression:  The patient tolerated his infusion relatively well.  Plan:  The patient will return in one month for ongoing care.    ________________________________  Sheral Apley. Tammi Klippel, M.D.

## 2019-05-21 ENCOUNTER — Other Ambulatory Visit: Payer: Self-pay | Admitting: Oncology

## 2019-05-22 ENCOUNTER — Other Ambulatory Visit: Payer: Self-pay

## 2019-05-22 ENCOUNTER — Inpatient Hospital Stay: Payer: Medicare Other | Attending: Oncology

## 2019-05-22 DIAGNOSIS — C7951 Secondary malignant neoplasm of bone: Secondary | ICD-10-CM | POA: Insufficient documentation

## 2019-05-22 DIAGNOSIS — Z923 Personal history of irradiation: Secondary | ICD-10-CM | POA: Diagnosis not present

## 2019-05-22 DIAGNOSIS — D649 Anemia, unspecified: Secondary | ICD-10-CM | POA: Insufficient documentation

## 2019-05-22 DIAGNOSIS — C61 Malignant neoplasm of prostate: Secondary | ICD-10-CM | POA: Insufficient documentation

## 2019-05-22 DIAGNOSIS — Z79899 Other long term (current) drug therapy: Secondary | ICD-10-CM | POA: Diagnosis not present

## 2019-05-22 DIAGNOSIS — Z9221 Personal history of antineoplastic chemotherapy: Secondary | ICD-10-CM | POA: Diagnosis not present

## 2019-05-22 LAB — CBC WITH DIFFERENTIAL (CANCER CENTER ONLY)
Abs Immature Granulocytes: 0.02 10*3/uL (ref 0.00–0.07)
Basophils Absolute: 0 10*3/uL (ref 0.0–0.1)
Basophils Relative: 0 %
Eosinophils Absolute: 0.2 10*3/uL (ref 0.0–0.5)
Eosinophils Relative: 4 %
HCT: 32 % — ABNORMAL LOW (ref 39.0–52.0)
Hemoglobin: 9.9 g/dL — ABNORMAL LOW (ref 13.0–17.0)
Immature Granulocytes: 0 %
Lymphocytes Relative: 11 %
Lymphs Abs: 0.7 10*3/uL (ref 0.7–4.0)
MCH: 29.9 pg (ref 26.0–34.0)
MCHC: 30.9 g/dL (ref 30.0–36.0)
MCV: 96.7 fL (ref 80.0–100.0)
Monocytes Absolute: 0.6 10*3/uL (ref 0.1–1.0)
Monocytes Relative: 9 %
Neutro Abs: 4.6 10*3/uL (ref 1.7–7.7)
Neutrophils Relative %: 76 %
Platelet Count: 308 10*3/uL (ref 150–400)
RBC: 3.31 MIL/uL — ABNORMAL LOW (ref 4.22–5.81)
RDW: 16.7 % — ABNORMAL HIGH (ref 11.5–15.5)
WBC Count: 6.2 10*3/uL (ref 4.0–10.5)
nRBC: 0 % (ref 0.0–0.2)

## 2019-05-22 LAB — CMP (CANCER CENTER ONLY)
ALT: 8 U/L (ref 0–44)
AST: 11 U/L — ABNORMAL LOW (ref 15–41)
Albumin: 2.6 g/dL — ABNORMAL LOW (ref 3.5–5.0)
Alkaline Phosphatase: 100 U/L (ref 38–126)
Anion gap: 8 (ref 5–15)
BUN: 10 mg/dL (ref 8–23)
CO2: 25 mmol/L (ref 22–32)
Calcium: 8.3 mg/dL — ABNORMAL LOW (ref 8.9–10.3)
Chloride: 106 mmol/L (ref 98–111)
Creatinine: 0.84 mg/dL (ref 0.61–1.24)
GFR, Est AFR Am: >60 mL/min (ref >60)
GFR, Estimated: >60 mL/min (ref >60)
Glucose, Bld: 158 mg/dL — ABNORMAL HIGH (ref 70–99)
Potassium: 3.4 mmol/L — ABNORMAL LOW (ref 3.5–5.1)
Sodium: 139 mmol/L (ref 135–145)
Total Bilirubin: 0.3 mg/dL (ref 0.3–1.2)
Total Protein: 6.3 g/dL — ABNORMAL LOW (ref 6.5–8.1)

## 2019-05-23 LAB — PROSTATE-SPECIFIC AG, SERUM (LABCORP): Prostate Specific Ag, Serum: 232 ng/mL — ABNORMAL HIGH (ref 0.0–4.0)

## 2019-05-25 ENCOUNTER — Inpatient Hospital Stay: Payer: Medicare Other

## 2019-05-25 ENCOUNTER — Other Ambulatory Visit: Payer: Self-pay

## 2019-05-25 ENCOUNTER — Inpatient Hospital Stay (HOSPITAL_BASED_OUTPATIENT_CLINIC_OR_DEPARTMENT_OTHER): Payer: Medicare Other | Admitting: Oncology

## 2019-05-25 VITALS — BP 112/76 | HR 96 | Temp 98.2°F | Resp 18 | Ht 72.0 in

## 2019-05-25 DIAGNOSIS — D649 Anemia, unspecified: Secondary | ICD-10-CM | POA: Diagnosis not present

## 2019-05-25 DIAGNOSIS — C7951 Secondary malignant neoplasm of bone: Secondary | ICD-10-CM

## 2019-05-25 DIAGNOSIS — Z95828 Presence of other vascular implants and grafts: Secondary | ICD-10-CM

## 2019-05-25 DIAGNOSIS — C61 Malignant neoplasm of prostate: Secondary | ICD-10-CM | POA: Diagnosis not present

## 2019-05-25 DIAGNOSIS — Z923 Personal history of irradiation: Secondary | ICD-10-CM | POA: Diagnosis not present

## 2019-05-25 DIAGNOSIS — Z9221 Personal history of antineoplastic chemotherapy: Secondary | ICD-10-CM | POA: Diagnosis not present

## 2019-05-25 DIAGNOSIS — Z79899 Other long term (current) drug therapy: Secondary | ICD-10-CM | POA: Diagnosis not present

## 2019-05-25 MED ORDER — LEUPROLIDE ACETATE (4 MONTH) 30 MG IM KIT
30.0000 mg | PACK | Freq: Once | INTRAMUSCULAR | Status: AC
  Administered 2019-05-25: 12:00:00 30 mg via INTRAMUSCULAR
  Filled 2019-05-25: qty 30

## 2019-05-25 NOTE — Progress Notes (Signed)
Hematology and Oncology Follow Up Visit  Dennis Fischer [id number] [birth year] 73 y.o. 05/25/2019 9:31 AM Dennis Fischer, MDKim, Dennis Quale, MD   Principle Diagnosis: 73 year old man with advanced prostate cancer diagnosed in 2018 with disease to the bone.  He has castration-resistant at this time.     Prior Therapy: He received definitive therapy using radiation therapy and androgen deprivation in 2016.  He developed advanced disease with pelvic adenopathy and was treated with androgen deprivation while was living in [place].  He subsequently developed castration resistant disease.  Zytiga 1000 mg daily restarted on 03/14/2017.  Therapy discontinued in August 2018 due to progression of disease.  Taxotere chemotherapy at 75 mg/m given every 3 weeks started on the first 2019.  He completed 8 cycles of therapy in January 2020.  He is status post epidural compression and decompressive laminectomy completed on March 09, 2019 between T7-T8 and T9 completed by Dr. Trenton Gammon.  He completed additional radiation therapy in the form of 10 fractions for a total of 30 Gy in July 2020.  Current therapy:   Lupron 30 mg every 4 months.    Xofigo started on December 16, 2018.  He is fifth infusion scheduled for September 2020.    Interim History:  Dennis Fischer is here for a follow-up.  Since the last visit, he continues to recover from his surgery and radiation although his mobility remains limited.  He is unable to ambulate and predominantly limited to wheelchair.  He denies any back pain, shoulder pain.  He denies any decline in his appetite or performance status.  He denies any recent hospitalizations or illnesses.   He denied headaches, blurry vision, syncope or seizures.  Denies any fevers, chills or sweats.  Denied chest pain, palpitation, orthopnea or leg edema.  Denied cough, wheezing or hemoptysis.  Denied nausea, vomiting or abdominal pain.  Denies any constipation or diarrhea.  Denies any frequency urgency  or hesitancy.  Denies any arthralgias or myalgias.  Denies any skin rashes or lesions.  Denies any bleeding or clotting tendency.  Denies any easy bruising.  Denies any hair or nail changes.  Denies any anxiety or depression.  Remaining review of system is negative.            Medications: Without any changes on review. Current Outpatient Medications  Medication Sig Dispense Refill  . acetaminophen (TYLENOL) 325 MG tablet Take 1-2 tablets (325-650 mg total) by mouth every 4 (four) hours as needed for mild pain.    Marland Kitchen amLODipine (NORVASC) 10 MG tablet TAKE 1 TABLET(10 MG) BY MOUTH DAILY (Patient taking differently: Take 10 mg by mouth daily. ) 90 tablet 3  . CALCIUM 500/D 500-200 MG-UNIT tablet TAKE 1 TABLET BY MOUTH TWICE DAILY 60 tablet 3  . carvedilol (COREG) 12.5 MG tablet TAKE 1 TABLET BY MOUTH TWICE DAILY WITH A MEAL (Patient taking differently: Take 12.5 mg by mouth 2 (two) times daily with a meal. ) 180 tablet 3  . cyclobenzaprine (FLEXERIL) 10 MG tablet Take 1 tablet (10 mg total) by mouth 3 (three) times daily as needed for muscle spasms. 60 tablet 0  . dexamethasone (DECADRON) 2 MG tablet Take 1 tablet (2 mg total) by mouth every 12 (twelve) hours. 60 tablet 1  . gabapentin (NEURONTIN) 300 MG capsule Take 1 capsule (300 mg total) by mouth at bedtime. 30 capsule 5  . HYDROcodone-acetaminophen (NORCO) 5-325 MG tablet Take 1 tablet by mouth every 6 (six) hours as needed for moderate pain. 30 tablet  0  . lidocaine-prilocaine (EMLA) cream Apply 1 application topically as needed. (Patient taking differently: Apply 1 application topically as needed (numbing). ) 30 g 0  . megestrol (MEGACE) 400 MG/10ML suspension Take 10 mLs (400 mg total) by mouth 2 (two) times daily. 240 mL 0  . ondansetron (ZOFRAN) 4 MG tablet Take 1 tablet (4 mg total) by mouth every 6 (six) hours as needed for nausea or vomiting. 20 tablet 0  . pantoprazole (PROTONIX) 40 MG tablet Take 1 tablet (40 mg total) by mouth  daily.    . polyethylene glycol (MIRALAX / GLYCOLAX) 17 g packet Take 17 g by mouth daily. 30 each 1  . prochlorperazine (COMPAZINE) 10 MG tablet TAKE 1 TABLET BY MOUTH EVERY 6 HOURS AS NEEDED FOR NAUSEA OR VOMITING (Patient taking differently: Take 10 mg by mouth every 6 (six) hours as needed for nausea or vomiting. ) 368 tablet 3  . senna-docusate (SENOKOT-S) 8.6-50 MG tablet Take 2 tablets by mouth at bedtime. 60 tablet 1  . simvastatin (ZOCOR) 20 MG tablet TAKE 1 TABLET BY MOUTH EVERY DAY 90 tablet 3   No current facility-administered medications for this visit.      Allergies:  Allergies  Allergen Reactions  . Lisinopril Swelling    Facial swelling  . Cat Hair Extract     eyes swell  . Mangifera Indica     MANGO --  . Mango Flavor     throat swells with mango  . Other   . Peanut-Containing Drug Products   . Pistachio Nut (Diagnostic)     hands and feet burn/itch  . Sunflower Oil     Sunflower seed allergy    Past Medical History, Surgical history, Social history, and Family History updated on review.    Physical Exam:   Blood pressure 112/76, pulse 96, temperature 98.2 F (36.8 C), temperature source Oral, resp. rate 18, height 6' (1.829 m), SpO2 99 %.      ECOG: 2    General appearance: Comfortable appearing without any discomfort Head: Normocephalic without any trauma Oropharynx: Mucous membranes are moist and pink without any thrush or ulcers. Eyes: Pupils are equal and round reactive to light. Lymph nodes: No cervical, supraclavicular, inguinal or axillary lymphadenopathy.   Heart:regular rate and rhythm.  S1 and S2 without leg edema. Lung: Clear without any rhonchi or wheezes.  No dullness to percussion. Abdomin: Soft, nontender, nondistended with good bowel sounds.  No hepatosplenomegaly. Musculoskeletal: No joint deformity or effusion.  Full range of motion noted. Neurological: No deficits noted on motor, sensory and deep tendon reflex exam. Skin:  No petechial rash or dryness.  Appeared moist.  Psychiatric: Mood and affect appeared appropriate.             Lab Results: Lab Results  Component Value Date   WBC 6.2 05/22/2019   HGB 9.9 (L) 05/22/2019   HCT 32.0 (L) 05/22/2019   MCV 96.7 05/22/2019   PLT 308 05/22/2019     Chemistry      Component Value Date/Time   NA 139 05/22/2019 1202   NA 141 08/08/2017 1116   K 3.4 (L) 05/22/2019 1202   K 3.6 08/08/2017 1116   CL 106 05/22/2019 1202   CO2 25 05/22/2019 1202   CO2 24 08/08/2017 1116   BUN 10 05/22/2019 1202   BUN 13.2 08/08/2017 1116   CREATININE 0.84 05/22/2019 1202   CREATININE 1.1 08/08/2017 1116      Component Value Date/Time   CALCIUM  8.3 (L) 05/22/2019 1202   CALCIUM 9.1 08/08/2017 1116   ALKPHOS 100 05/22/2019 1202   ALKPHOS 214 (H) 08/08/2017 1116   AST 11 (L) 05/22/2019 1202   AST 17 08/08/2017 1116   ALT 8 05/22/2019 1202   ALT 17 08/08/2017 1116   BILITOT 0.3 05/22/2019 1202   BILITOT 0.29 08/08/2017 1116                Impression and Plan:  73 year old man with:   1.  Castration-resistant prostate cancer with disease to the bone documented in 2018.   He is status post therapy as outlined above and currently on Xofigo which is scheduled to resume in the near future.  The fifth and the sixth treatment will be concluded by October 2020.  Different treatment options were reiterated at this time including restarting systemic chemotherapy versus PARP inhibitor if he harbors the appropriate mutation.  The plan is to finish Trudi Ida first and readdress in the future.    2. Androgen depravation: Last Lupron was given on November 06, 2018.  Risks and benefits of resuming it was discussed today and he is agreeable to proceed.  Long-term complications including osteoporosis and weight gain were reiterated.   3.  Anemia: Hemoglobin remains adequate at this time.  This is related to malignancy and previous chemotherapy.  4.  Goals of  care: His disease is incurable and any therapy is palliative at this time.  5.  Bone directed therapy: He will remain on calcium and vitamin D supplements for the time being.  6.  IV access: Port-A-Cath will be flushed in 8 weeks after repeat evaluation.  7. Follow-up: In 8 weeks for repeat evaluation.  25  minutes was spent with the patient face-to-face today.  More than 50% of time was spent on reviewing his disease status, present treatment options and coordinating plan of care.   Zola Button, MD 9/14/20209:31 AM

## 2019-05-25 NOTE — Patient Instructions (Signed)

## 2019-05-26 ENCOUNTER — Telehealth: Payer: Self-pay | Admitting: Oncology

## 2019-05-26 ENCOUNTER — Ambulatory Visit: Payer: Medicare Other | Attending: Radiation Oncology

## 2019-05-26 ENCOUNTER — Other Ambulatory Visit: Payer: Self-pay | Admitting: *Deleted

## 2019-05-26 DIAGNOSIS — C7951 Secondary malignant neoplasm of bone: Secondary | ICD-10-CM | POA: Insufficient documentation

## 2019-05-26 DIAGNOSIS — C61 Malignant neoplasm of prostate: Secondary | ICD-10-CM

## 2019-05-26 DIAGNOSIS — Z7952 Long term (current) use of systemic steroids: Secondary | ICD-10-CM | POA: Insufficient documentation

## 2019-05-26 DIAGNOSIS — Z888 Allergy status to other drugs, medicaments and biological substances status: Secondary | ICD-10-CM | POA: Insufficient documentation

## 2019-05-26 DIAGNOSIS — Z79899 Other long term (current) drug therapy: Secondary | ICD-10-CM | POA: Insufficient documentation

## 2019-05-26 DIAGNOSIS — I10 Essential (primary) hypertension: Secondary | ICD-10-CM | POA: Insufficient documentation

## 2019-05-26 DIAGNOSIS — Z91010 Allergy to peanuts: Secondary | ICD-10-CM | POA: Insufficient documentation

## 2019-05-26 DIAGNOSIS — Z7982 Long term (current) use of aspirin: Secondary | ICD-10-CM | POA: Insufficient documentation

## 2019-05-26 DIAGNOSIS — Z8042 Family history of malignant neoplasm of prostate: Secondary | ICD-10-CM | POA: Insufficient documentation

## 2019-05-26 DIAGNOSIS — Z87891 Personal history of nicotine dependence: Secondary | ICD-10-CM | POA: Insufficient documentation

## 2019-05-26 DIAGNOSIS — C7952 Secondary malignant neoplasm of bone marrow: Secondary | ICD-10-CM | POA: Insufficient documentation

## 2019-05-26 MED ORDER — CARVEDILOL 12.5 MG PO TABS: ORAL_TABLET | ORAL | 3 refills | Status: DC

## 2019-05-26 MED ORDER — SIMVASTATIN 20 MG PO TABS: 20.0000 mg | ORAL_TABLET | Freq: Every day | ORAL | 3 refills | Status: DC

## 2019-05-26 NOTE — Telephone Encounter (Signed)
Called and left msg. Mailed printout  °

## 2019-05-29 DIAGNOSIS — C7951 Secondary malignant neoplasm of bone: Secondary | ICD-10-CM | POA: Diagnosis not present

## 2019-05-29 DIAGNOSIS — D630 Anemia in neoplastic disease: Secondary | ICD-10-CM | POA: Diagnosis not present

## 2019-05-29 DIAGNOSIS — Z7952 Long term (current) use of systemic steroids: Secondary | ICD-10-CM | POA: Diagnosis not present

## 2019-05-29 DIAGNOSIS — K5900 Constipation, unspecified: Secondary | ICD-10-CM | POA: Diagnosis not present

## 2019-05-29 DIAGNOSIS — R5381 Other malaise: Secondary | ICD-10-CM | POA: Diagnosis not present

## 2019-05-29 DIAGNOSIS — C61 Malignant neoplasm of prostate: Secondary | ICD-10-CM | POA: Diagnosis not present

## 2019-05-29 DIAGNOSIS — R627 Adult failure to thrive: Secondary | ICD-10-CM | POA: Diagnosis not present

## 2019-05-29 DIAGNOSIS — M6281 Muscle weakness (generalized): Secondary | ICD-10-CM | POA: Diagnosis not present

## 2019-06-01 ENCOUNTER — Other Ambulatory Visit: Payer: Self-pay

## 2019-06-01 ENCOUNTER — Telehealth: Payer: Self-pay | Admitting: *Deleted

## 2019-06-01 DIAGNOSIS — R5381 Other malaise: Secondary | ICD-10-CM | POA: Diagnosis not present

## 2019-06-01 DIAGNOSIS — C7951 Secondary malignant neoplasm of bone: Secondary | ICD-10-CM | POA: Diagnosis not present

## 2019-06-01 DIAGNOSIS — C61 Malignant neoplasm of prostate: Secondary | ICD-10-CM | POA: Diagnosis not present

## 2019-06-01 DIAGNOSIS — R627 Adult failure to thrive: Secondary | ICD-10-CM | POA: Diagnosis not present

## 2019-06-01 DIAGNOSIS — D630 Anemia in neoplastic disease: Secondary | ICD-10-CM | POA: Diagnosis not present

## 2019-06-01 DIAGNOSIS — M6281 Muscle weakness (generalized): Secondary | ICD-10-CM | POA: Diagnosis not present

## 2019-06-01 NOTE — Telephone Encounter (Signed)
CALLED PATIENT TO REMIND OF XOFIGO INJ. FOR 06-02-19 - ARRIVAL TIME- 11:45 AM, SPOKE WITH PATIENT AND HE IS AWARE OF THIS INJ.

## 2019-06-01 NOTE — Progress Notes (Signed)
Sharyn Lull, RN with home health called requesting an order for a hospital bed for the patient. Per RN, patient has a healed pressure wound on his back side, has decreased mobility, and requires help with frequent repositioning/turning.   Patient suffers from chronic pain which is caused by prostate cancer with metastasis to bone/spine. Hospital bed will alleviate pain by allowing head and spine to be positioned in ways not feasible with a normal bed. Frequent pain episodes require frequent and immediate changes in body position which cannot be achieved with a normal bed.   Per Dr. Alen Blew, ok for hospital bed to be ordered for the patient. Orders entered. Zach at [facility] notified.

## 2019-06-02 ENCOUNTER — Ambulatory Visit (HOSPITAL_COMMUNITY)
Admission: RE | Admit: 2019-06-02 | Discharge: 2019-06-02 | Disposition: A | Discharge status: Home / Self Care | Payer: Medicare Other | Source: Ambulatory Visit | Attending: Radiation Oncology | Admitting: Radiation Oncology

## 2019-06-02 ENCOUNTER — Telehealth: Payer: Self-pay

## 2019-06-02 ENCOUNTER — Telehealth: Payer: Self-pay | Admitting: Family Medicine

## 2019-06-02 ENCOUNTER — Other Ambulatory Visit: Payer: Self-pay

## 2019-06-02 DIAGNOSIS — C61 Malignant neoplasm of prostate: Secondary | ICD-10-CM | POA: Diagnosis not present

## 2019-06-02 DIAGNOSIS — C7951 Secondary malignant neoplasm of bone: Secondary | ICD-10-CM | POA: Insufficient documentation

## 2019-06-02 MED ORDER — RADIUM RA 223 DICHLORIDE 30 MCCI/ML IV SOLN
109.7000 | Freq: Once | INTRAVENOUS | Status: AC
  Administered 2019-06-02: 109.7 via INTRAVENOUS

## 2019-06-02 NOTE — Telephone Encounter (Signed)
[facility]PT[facility] After Hours Emergency Line Note  Mr. Rushin physical therapy assistant calls to report that he is significantly constipated today.  He is taking senna and MiraLAX once a day, but he has needed fecal disimpaction at home over the past day.  He also fell recently, and although he says that his right shoulder hurt, he was able to participate with therapy, so she is less worried about this.  I advised her to tell him and his wife to increase his senna to twice daily and to titrate his MiraLAX up to have a soft bowel movement daily.  He does take Norco at home, so he will need senna for motility, but he can take as much MiraLAX as needed to reduce his constipation.  She was in agreement with this plan and will pass on this information.  Will forward to patient's PCP as an FYI.  Amanda C. Shan Levans, MD PGY-3, [facility] 06/02/2019 6:15 PM

## 2019-06-02 NOTE — Telephone Encounter (Signed)
-----   Message from Wyatt Portela, MD sent at 06/02/2019  7:21 AM EDT ----- Regarding: RE: Patient advice request Please let him know to use over the counter melatonin. If not successful, will send a rx for that. Thanks.  ----- Message ----- From: Teodoro Spray, RN Sent: 06/01/2019   2:10 PM EDT To: Wyatt Portela, MD Subject: Patient advice request                         Pt has stated he has been having troubles sleeping for the last 3 weeks.  Pt is requesting prescription medication to help him sleep.

## 2019-06-02 NOTE — Progress Notes (Signed)
  Radiation Oncology         [phone] ________________________________  Name: Nelse Hoague MRN: [id number]  Date: 06/02/2019  DOB: 1945-10-27  Radium-223 Infusion Note  Diagnosis:  Castration resistant prostate cancer with painful bone involvement  Current Infusion:    5  Planned Infusions:  6  Narrative: Mr. Maciej Dillow presented to nuclear medicine for treatment. His most recent blood counts were reviewed.  He remains a good candidate to proceed with Ra-223.  The patient was situated in an infusion suite with a contact barrier placed under his arm. Intravenous access was established, using sterile technique, and a normal saline infusion from a syringe was started.  Micro-dosimetry:  The prescribed radiation activity was assayed and confirmed to be within specified tolerance.  Special Treatment Procedure - Infusion:  The nuclear medicine technologist and I personally verified the dose activity to be delivered as specified in the written directive, and verified the patient identification via 2 separate methods.  The syringe containing the dose was attached to an intravenous access and the dose delivered over a minute. No complications were noted.  The total administered dose was 115.8 microcuries.   A saline flush of the line and the syringe that contained the isotope was then performed.  The residual radioactivity in the syringe was 6.1 microcuries.   Pressure was applied to the venipuncture site, and a compression bandage placed.   Radiation Safety personnel were present to perform the discharge survey, as detailed on their documentation.   After a short period of observation, the patient had his IV removed.  Impression:  The patient tolerated his infusion relatively well.  Plan:  The patient will return in one month for ongoing care.    ________________________________  Sheral Apley. Tammi Klippel, M.D.   This document serves as a record of services personally performed by Tyler Pita, MD. It was created on his behalf by Wilburn Mylar, a trained medical scribe. The creation of this record is based on the scribe's personal observations and the provider's statements to them. This document has been checked and approved by the attending provider.

## 2019-06-02 NOTE — Telephone Encounter (Signed)
Called patient to inform him of information listed below.  Patient verbalized understanding. Patient advised to call office if he had any other questions or if the melatonin did not work.

## 2019-06-04 ENCOUNTER — Other Ambulatory Visit: Payer: Self-pay

## 2019-06-04 ENCOUNTER — Encounter (HOSPITAL_COMMUNITY): Payer: Self-pay

## 2019-06-04 DIAGNOSIS — I10 Essential (primary) hypertension: Secondary | ICD-10-CM | POA: Insufficient documentation

## 2019-06-04 DIAGNOSIS — Z91010 Allergy to peanuts: Secondary | ICD-10-CM | POA: Insufficient documentation

## 2019-06-04 DIAGNOSIS — F1290 Cannabis use, unspecified, uncomplicated: Secondary | ICD-10-CM | POA: Diagnosis not present

## 2019-06-04 DIAGNOSIS — K5900 Constipation, unspecified: Secondary | ICD-10-CM | POA: Diagnosis not present

## 2019-06-04 DIAGNOSIS — Z87891 Personal history of nicotine dependence: Secondary | ICD-10-CM | POA: Diagnosis not present

## 2019-06-04 DIAGNOSIS — Z79899 Other long term (current) drug therapy: Secondary | ICD-10-CM | POA: Insufficient documentation

## 2019-06-04 DIAGNOSIS — Z8546 Personal history of malignant neoplasm of prostate: Secondary | ICD-10-CM | POA: Insufficient documentation

## 2019-06-04 LAB — CBC
HCT: 33.8 % — ABNORMAL LOW (ref 39.0–52.0)
Hemoglobin: 10.1 g/dL — ABNORMAL LOW (ref 13.0–17.0)
MCH: 28.6 pg (ref 26.0–34.0)
MCHC: 29.9 g/dL — ABNORMAL LOW (ref 30.0–36.0)
MCV: 95.8 fL (ref 80.0–100.0)
Platelets: 480 10*3/uL — ABNORMAL HIGH (ref 150–400)
RBC: 3.53 MIL/uL — ABNORMAL LOW (ref 4.22–5.81)
RDW: 16.3 % — ABNORMAL HIGH (ref 11.5–15.5)
WBC: 8.9 10*3/uL (ref 4.0–10.5)
nRBC: 0 % (ref 0.0–0.2)

## 2019-06-04 LAB — COMPREHENSIVE METABOLIC PANEL
ALT: 18 U/L (ref 0–44)
AST: 18 U/L (ref 15–41)
Albumin: 2.9 g/dL — ABNORMAL LOW (ref 3.5–5.0)
Alkaline Phosphatase: 92 U/L (ref 38–126)
Anion gap: 13 (ref 5–15)
BUN: 9 mg/dL (ref 8–23)
CO2: 22 mmol/L (ref 22–32)
Calcium: 8.6 mg/dL — ABNORMAL LOW (ref 8.9–10.3)
Chloride: 105 mmol/L (ref 98–111)
Creatinine, Ser: 0.89 mg/dL (ref 0.61–1.24)
GFR calc Af Amer: >60 mL/min (ref >60)
GFR calc non Af Amer: >60 mL/min (ref >60)
Glucose, Bld: 134 mg/dL — ABNORMAL HIGH (ref 70–99)
Potassium: 3.4 mmol/L — ABNORMAL LOW (ref 3.5–5.1)
Sodium: 140 mmol/L (ref 135–145)
Total Bilirubin: 0.6 mg/dL (ref 0.3–1.2)
Total Protein: 7 g/dL (ref 6.5–8.1)

## 2019-06-04 LAB — LIPASE, BLOOD: Lipase: 16 U/L (ref 11–51)

## 2019-06-04 MED ORDER — SODIUM CHLORIDE 0.9% FLUSH: 3.0000 mL | Freq: Once | INTRAVENOUS | Status: DC

## 2019-06-04 NOTE — Telephone Encounter (Signed)
Beth PTA for [facility] is calling to inform Dr. Parks Ranger pt is very constipated.Had sm BM yesterday but pt feels real stopped up. Pt vomited this am. Was told to take his senna but pt doesn't have any. Beth doesn't think pt is taking much if any of the MiraLax. Pt wife can be reached at [phone]. Beth can be reached at [phone].Ottis Stain, CMA

## 2019-06-04 NOTE — ED Triage Notes (Signed)
Patient states he has constipation x 7 days. Patient states he had Mag citrate and Mira Lax this AM.

## 2019-06-04 NOTE — Telephone Encounter (Signed)
Called home number and wife picked up the phone with patient in the background, on speaker phone.  Patient was throwing up all night, per patient. Wife was sleeping and did not hear. This morning, had a large volume emesis that was described as "black". When asked if it was mucousy or looked like coffee grounds, wife reports, "Yes, it looked like coffee grounds".  Last bowel movement was a week ago. On Saturday 05/30/19, patient had a small bowel movement, but wife had to help removed hard stool ball from the rectum.  Patient's PT assistant called on 9/22 (see note) and was instructed to take mirilax and senna. However, they do not have this at home and are not able to make it to the pharmacy without neighbor's help (who is not home). Therefore, wife has been giving patient metamucil instead.   Patient has not had any fever, but does note vomiting as above. Additionally, patient is passing gas and having small specks of stool come out when passing gas. Patient reports he feels like he needs to go, but can't. Patient takes norco daily, but does not have a bowel regimen.  While on the phone, I asked the wife to press on patient's stomach with the same pressure she would use to put make up on her face. Patient did not have any pain, but wife noted that it was "very hard" to touch. Neither believe his stomach appears distended. Most recent BP was 118/70. They do not remember the heart rate.  They both sound calm on the phone. Patient does not sound distressed.   Plan:  Directed patient and wife to go to the emergency department given coffee ground emesis. Additionally, he can be evaluated for need for stool disimpaction or partial/complete SBO. Patient high risk given active metastatic cancer and would not want to prolong obtaining care for this patient with home management. Patient and wife in agreement. They will go to either [facility] or [facility].   - encouraged patient to use senna and mirilax daily  scheduled in setting of chronic opiate use in the future to avoid constipation.   [name], M.D.  PGY-2  Family Medicine  [phone] 06/04/2019 4:14 PM

## 2019-06-05 ENCOUNTER — Emergency Department (HOSPITAL_COMMUNITY): Payer: Medicare Other

## 2019-06-05 ENCOUNTER — Telehealth: Payer: Self-pay

## 2019-06-05 ENCOUNTER — Emergency Department (HOSPITAL_COMMUNITY)
Admission: EM | Admit: 2019-06-05 | Discharge: 2019-06-05 | Disposition: A | Discharge status: Home / Self Care | Payer: Medicare Other | Attending: Emergency Medicine | Admitting: Emergency Medicine

## 2019-06-05 ENCOUNTER — Encounter (HOSPITAL_COMMUNITY): Payer: Self-pay | Admitting: Emergency Medicine

## 2019-06-05 DIAGNOSIS — K5900 Constipation, unspecified: Secondary | ICD-10-CM | POA: Diagnosis not present

## 2019-06-05 IMAGING — CR DG ABDOMEN ACUTE W/ 1V CHEST
4 series · 4 of 4 positions shown · non-contrast
Comparison: CT of the abdomen pelvis dated [DATE]

CLINICAL DATA: 72-year-old male with constipation and lower
abdominal pain.

EXAM:
DG ABDOMEN ACUTE W/ 1V CHEST

[w abdomen decub]
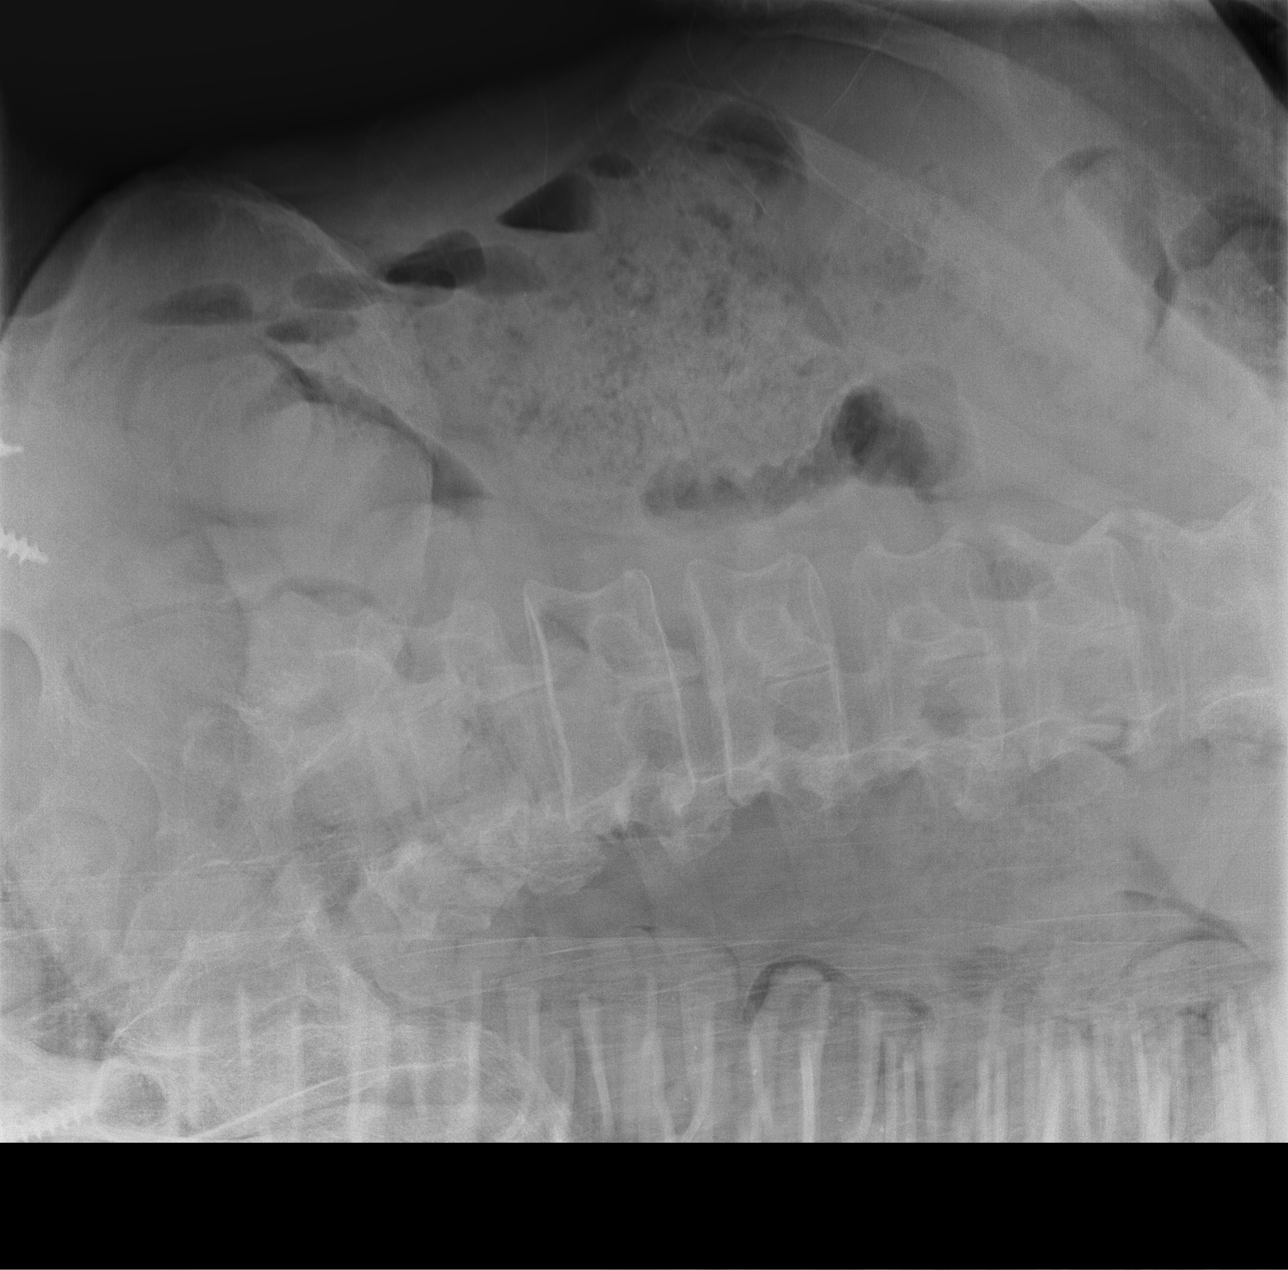

[x abdomen supine (1 of 2)]
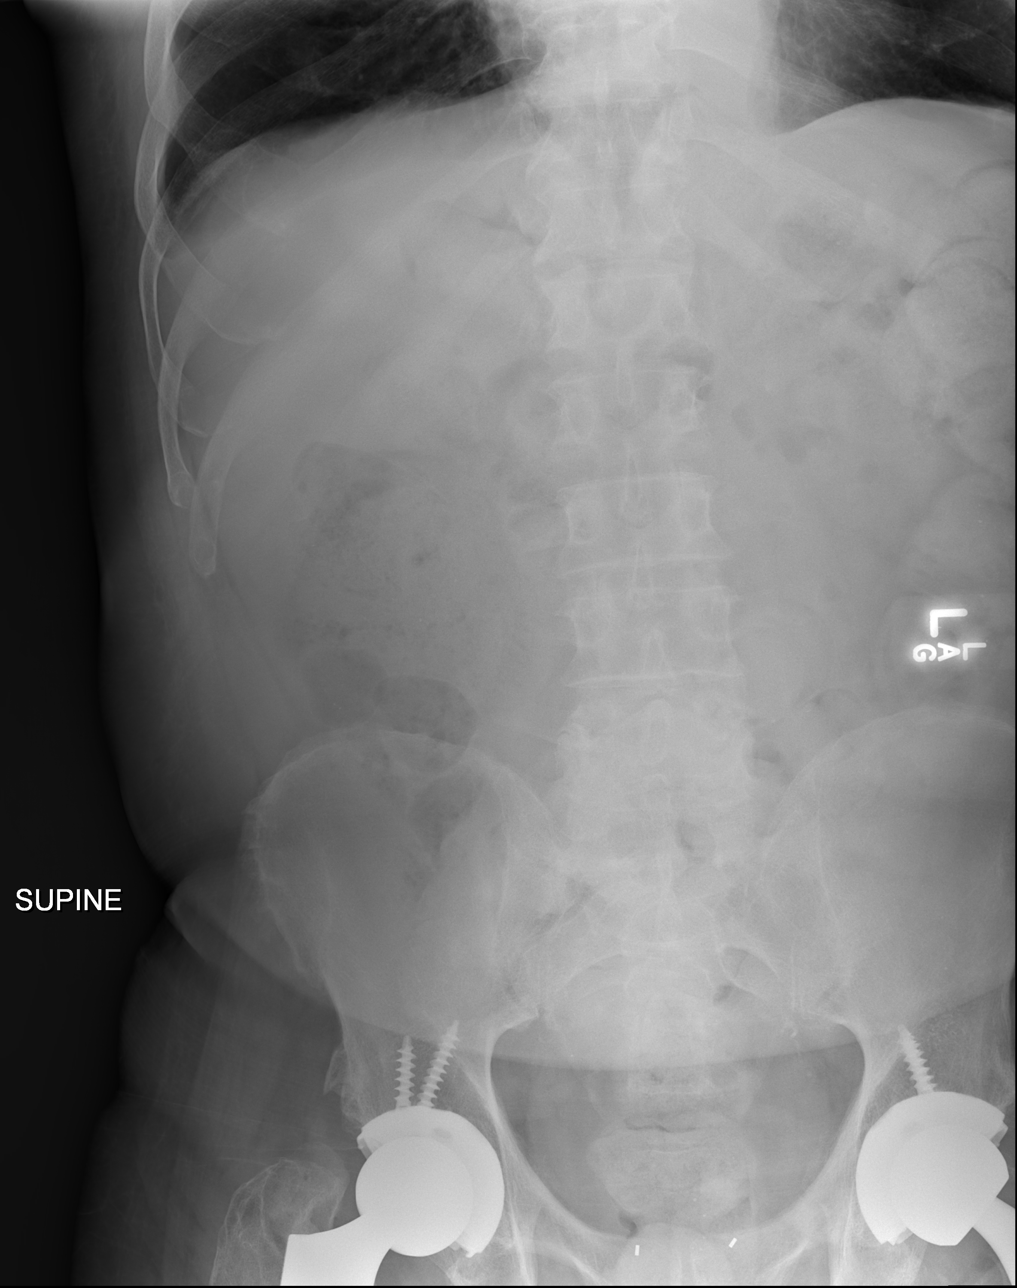

[x abdomen supine (2 of 2)]
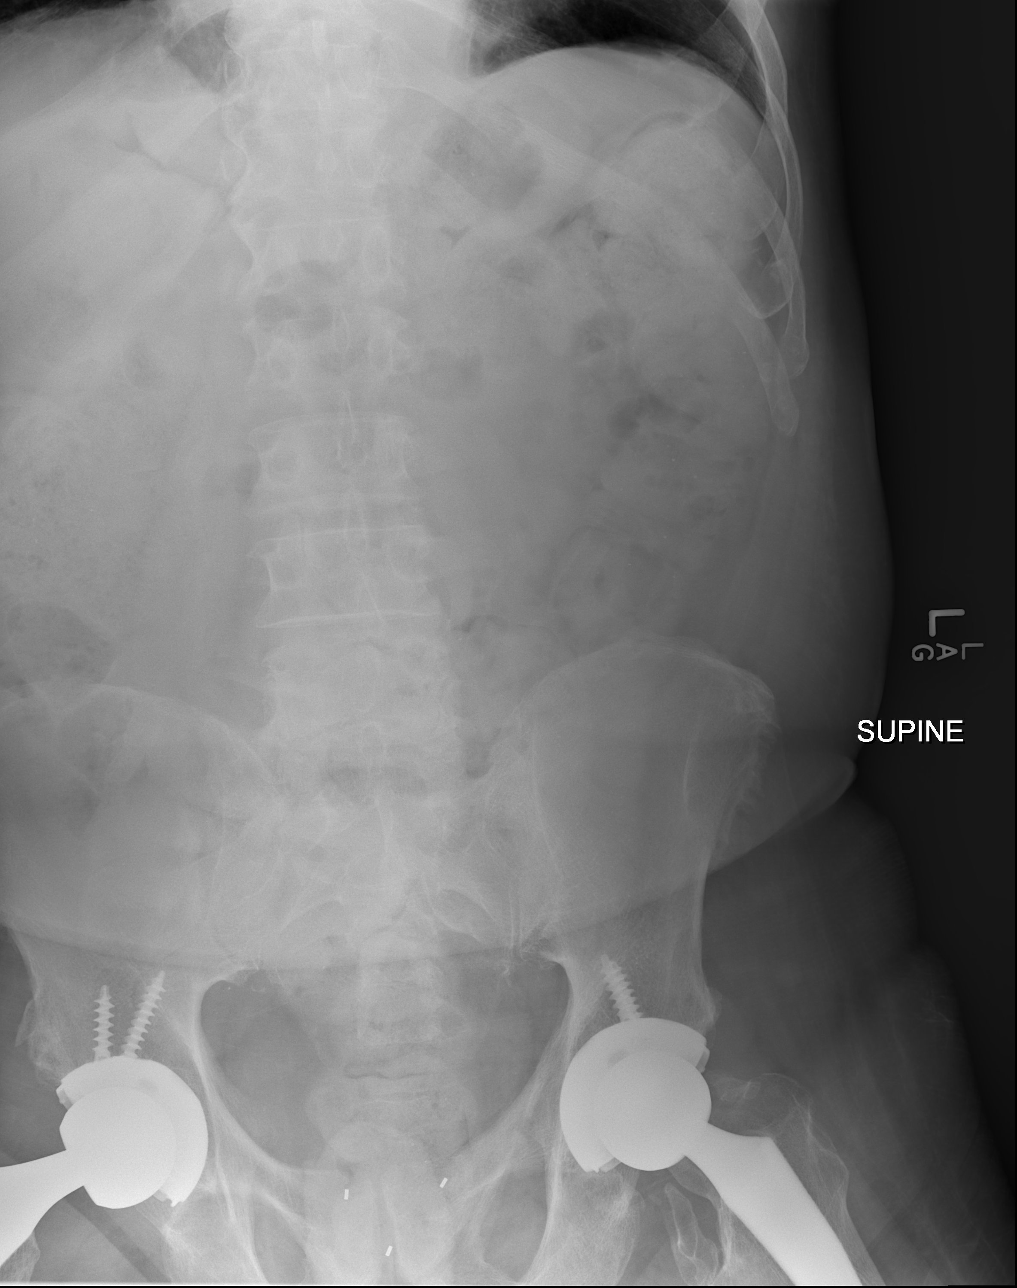

[x chest ap]
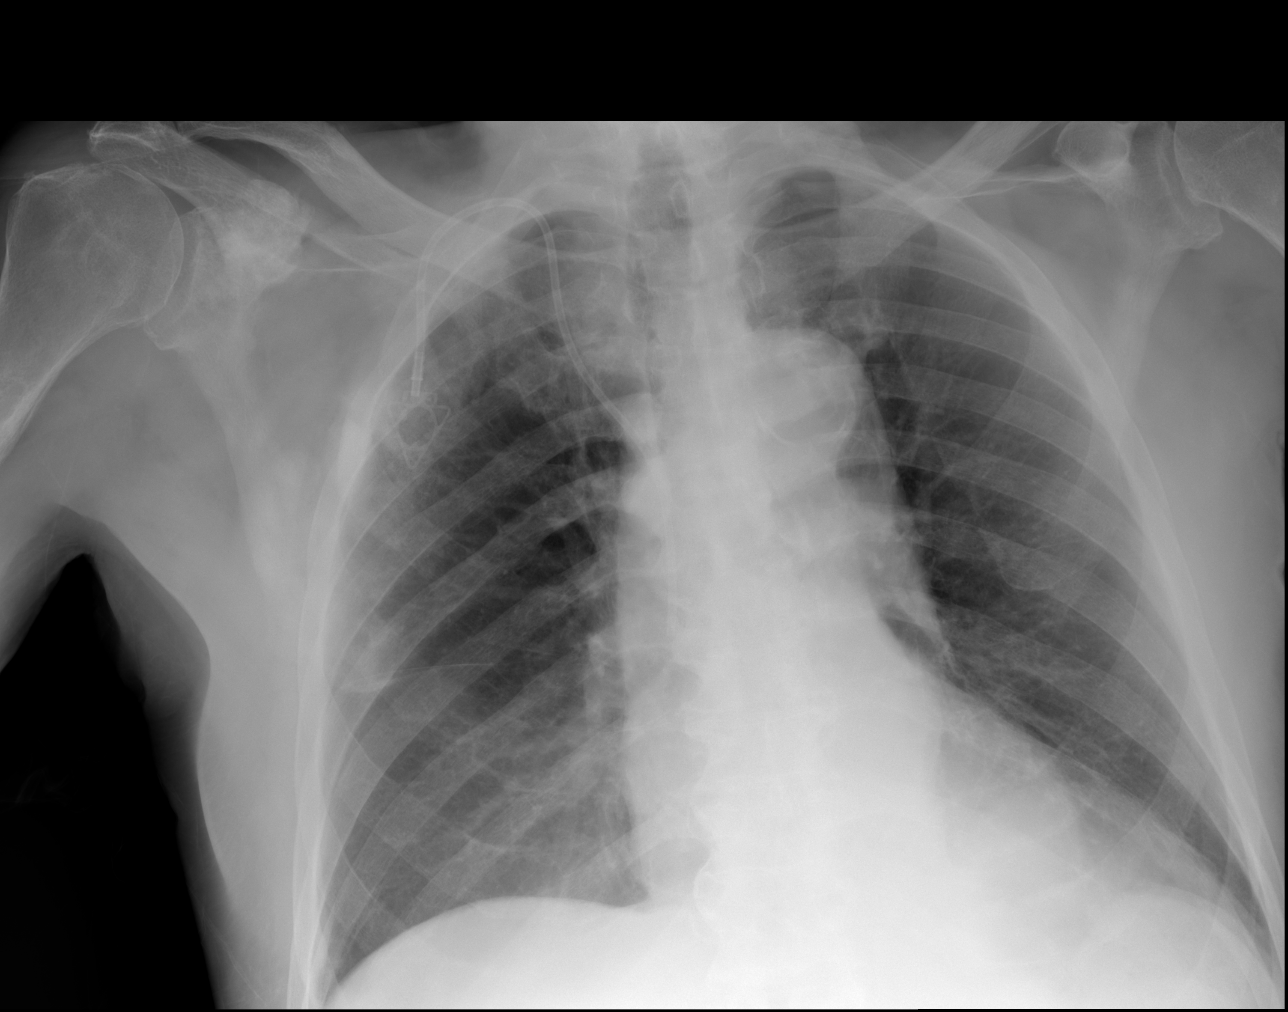

[4 of 4 positions shown; findings below may reference images not displayed]

FINDINGS: Right pectoral Port-A-Cath with tip at the cavoatrial junction.
Areas of peripheral and subpleural hazy densities in the right upper
lobe likely represent sclerotic metastasis to the right scapula. The
lungs are otherwise clear. There is no pleural effusion or
pneumothorax. Mild cardiomegaly. Atherosclerotic calcification of
the aorta.

There is large amount of stool throughout the colon. No bowel
dilatation or evidence of obstruction. No free air. Degenerative
changes of the spine. No acute osseous pathology. Bilateral hip
arthroplasties. Prostate fiducial markers.
IMPRESSION: 1. No acute cardiopulmonary process.
2. Constipation.  No bowel obstruction.
3. Sclerotic metastasis of the right scapula.

## 2019-06-05 NOTE — Telephone Encounter (Signed)
Patient calls nurse line stating she is upset with patients care in ED today and last night. Patients wife stated, "this is ridiculous, he has cancer." Wife was rambling, unsure of what the actual problem is. Wife asking for PCP to call her for additional instructions for patients care at home, as she was on the way to pick him up.

## 2019-06-05 NOTE — ED Provider Notes (Signed)
[facility] DEPT Provider Note   CSN: [id number] Arrival date & time: 06/04/19  1647     History   Chief Complaint Chief Complaint  Patient presents with  . Constipation    HPI Dennis Fischer is a 73 y.o. male.     The history is provided by the patient.  Constipation Severity:  Moderate Time since last bowel movement:  1 week Timing:  Constant Progression:  Improving (had a large BM just prior to evaluation by EDP) Chronicity:  Chronic Context: medication and narcotics   Stool description:  Formed Unusual stool frequency:  Every few days to a week Relieved by:  Laxatives Worsened by:  Narcotic pain medications Ineffective treatments:  Miralax Associated symptoms: no abdominal pain, no anorexia, no back pain, no diarrhea, no dysuria, no fever, no flatus, no hematochezia, no nausea, no urinary retention and no vomiting   Risk factors: no change in medication   Patient with cancer on pain medication presents with constipation.  Took mag citrate just prior to arrival.  Stated just prior to being seen had a large BM that was easy to pass.  Denies bleeding or pain.    Past Medical History:  Diagnosis Date  . Acute blood loss anemia   . Hypertension   . Metastatic cancer to bone ([place]) dx'd 2018  . Prostate cancer [facility])     Patient Active Problem List   Diagnosis Date Noted  . Constipation 05/15/2019  . Elevated BUN   . Transaminitis   . Hypoalbuminemia due to protein-calorie malnutrition ([place])   . Steroid-induced hyperglycemia   . Essential hypertension   . Metastatic cancer to spine ([place]) 03/11/2019  . Neuropathic pain   . Benign essential HTN   . Cocaine use 03/10/2019  . Cord compression myelopathy ([place])   . Spinal cord compression ([place]) 03/09/2019  . Prostate cancer metastatic to bone ([place]) 12/22/2018  . Port-A-Cath in place 07/02/2018  . Goals of care, counseling/discussion 01/20/2018  . Right knee pain 01/17/2018  . Prostate  cancer ([place]) 01/22/2017  . Prediabetes 03/30/2016  . Positive FIT (fecal immunochemical test) 11/22/2015  . GERD (gastroesophageal reflux disease) 12/21/2014  . Age-related nuclear cataract of both eyes 01/13/2013  . Hypertensive retinopathy 01/13/2013  . Essential (primary) hypertension 11/25/2012  . Pure hypercholesterolemia 11/25/2012  . History of total hip replacement 08/28/2010  . Tobacco dependence syndrome 03/22/2006    Past Surgical History:  Procedure Laterality Date  . HIP SURGERY Bilateral   . IR IMAGING GUIDED PORT INSERTION  04/22/2018  . LAMINECTOMY N/A 03/09/2019   Procedure: THORACIC SEVEN - THORACIC EIGHT - THORACIC NINE LAMINECTOMY WITH RESECTION OF EPIDURAL TUMOR;  Surgeon: Earnie Larsson, MD;  Location: [facility];  Service: Neurosurgery;  Laterality: N/A;        Home Medications    Prior to Admission medications   Medication Sig Start Date End Date Taking? Authorizing Provider  acetaminophen (TYLENOL) 325 MG tablet Take 1-2 tablets (325-650 mg total) by mouth every 4 (four) hours as needed for mild pain. 03/27/19   Love, Ivan Anchors, PA-C  amLODipine (NORVASC) 10 MG tablet TAKE 1 TABLET(10 MG) BY MOUTH DAILY Patient taking differently: Take 10 mg by mouth daily.  02/23/19   [name], MD  CALCIUM 500/D 500-200 MG-UNIT tablet TAKE 1 TABLET BY MOUTH TWICE DAILY 05/21/19   Wyatt Portela, MD  carvedilol (COREG) 12.5 MG tablet TAKE 1 TABLET BY MOUTH TWICE DAILY WITH A MEAL 05/26/19   [name],  MD  cyclobenzaprine (FLEXERIL) 10 MG tablet Take 1 tablet (10 mg total) by mouth 3 (three) times daily as needed for muscle spasms. 03/27/19   Love, Ivan Anchors, PA-C  dexamethasone (DECADRON) 2 MG tablet Take 1 tablet (2 mg total) by mouth every 12 (twelve) hours. 03/27/19   Love, Ivan Anchors, PA-C  gabapentin (NEURONTIN) 300 MG capsule Take 1 capsule (300 mg total) by mouth at bedtime. 01/27/19   Tanner, Lyndon Code., PA-C  HYDROcodone-acetaminophen (NORCO) 5-325 MG tablet Take 1 tablet by mouth  every 6 (six) hours as needed for moderate pain. 04/28/19   Wyatt Portela, MD  lidocaine-prilocaine (EMLA) cream Apply 1 application topically as needed. Patient taking differently: Apply 1 application topically as needed (numbing).  04/15/18   Wyatt Portela, MD  megestrol (MEGACE) 400 MG/10ML suspension Take 10 mLs (400 mg total) by mouth 2 (two) times daily. 04/27/19   Wyatt Portela, MD  ondansetron (ZOFRAN) 4 MG tablet Take 1 tablet (4 mg total) by mouth every 6 (six) hours as needed for nausea or vomiting. 03/11/19   Anderson, Chelsey L, DO  pantoprazole (PROTONIX) 40 MG tablet Take 1 tablet (40 mg total) by mouth daily. 03/12/19   Anderson, Chelsey L, DO  polyethylene glycol (MIRALAX / GLYCOLAX) 17 g packet Take 17 g by mouth daily. 03/27/19   Love, Ivan Anchors, PA-C  prochlorperazine (COMPAZINE) 10 MG tablet TAKE 1 TABLET BY MOUTH EVERY 6 HOURS AS NEEDED FOR NAUSEA OR VOMITING Patient taking differently: Take 10 mg by mouth every 6 (six) hours as needed for nausea or vomiting.  06/11/18   Wyatt Portela, MD  senna-docusate (SENOKOT-S) 8.6-50 MG tablet Take 2 tablets by mouth at bedtime. 04/27/19   Wyatt Portela, MD  simvastatin (ZOCOR) 20 MG tablet Take 1 tablet (20 mg total) by mouth daily. 05/26/19   [name], MD    Family History Family History  Problem Relation Age of Onset  . Prostate cancer Father     Social History Social History   Tobacco Use  . Smoking status: Former Smoker    Packs/day: 0.25    Years: 50.00    Pack years: 12.50    Types: Cigarettes    Quit date: 09/10/2014    Years since quitting: 4.7  . Smokeless tobacco: Never Used  . Tobacco comment: Reports smoking only when he drank.  Substance Use Topics  . Alcohol use: Yes    Comment: Once every 2 weeks.   . Drug use: Yes    Types: Marijuana    Comment: Medical      Allergies   Lisinopril, Cat hair extract, Mangifera indica, Mango flavor, Other, Peanut-containing drug products, Pistachio nut (diagnostic),  and Sunflower oil   Review of Systems Review of Systems  Constitutional: Negative for fever.  HENT: Negative for congestion.   Eyes: Negative for visual disturbance.  Respiratory: Negative for cough and shortness of breath.   Gastrointestinal: Positive for constipation. Negative for abdominal pain, anorexia, diarrhea, flatus, hematochezia, nausea and vomiting.  Genitourinary: Negative for difficulty urinating and dysuria.  Musculoskeletal: Negative for back pain.  Neurological: Negative for dizziness.  Psychiatric/Behavioral: Negative for agitation.  All other systems reviewed and are negative.    Physical Exam Updated Vital Signs BP (!) 151/88 (BP Location: Right Arm)   Pulse 70   Temp 98.8 F (37.1 C) (Oral)   Resp 16   Ht 6' (1.829 m)   Wt 84.8 kg   SpO2 100%  BMI 25.36 kg/m   Physical Exam Vitals signs and nursing note reviewed.  Constitutional:      General: He is not in acute distress.    Appearance: He is normal weight.  HENT:     Head: Normocephalic and atraumatic.     Nose: Nose normal.  Eyes:     Conjunctiva/sclera: Conjunctivae normal.     Pupils: Pupils are equal, round, and reactive to light.  Neck:     Musculoskeletal: Normal range of motion and neck supple.  Cardiovascular:     Rate and Rhythm: Normal rate and regular rhythm.     Pulses: Normal pulses.     Heart sounds: Normal heart sounds.  Pulmonary:     Effort: Pulmonary effort is normal.     Breath sounds: Normal breath sounds.  Abdominal:     General: Abdomen is flat.     Palpations: Abdomen is soft.     Tenderness: There is no abdominal tenderness. There is no guarding or rebound.     Comments: hyperactive  Musculoskeletal: Normal range of motion.  Skin:    General: Skin is warm and dry.     Capillary Refill: Capillary refill takes less than 2 seconds.  Neurological:     General: No focal deficit present.     Mental Status: He is alert and oriented to person, place, and time.      Deep Tendon Reflexes: Reflexes normal.  Psychiatric:        Mood and Affect: Mood normal.        Behavior: Behavior normal.      ED Treatments / Results  Labs (all labs ordered are listed, but only abnormal results are displayed) Results for orders placed or performed during the hospital encounter of 06/05/19  Lipase, blood  Result Value Ref Range   Lipase 16 11 - 51 U/L  Comprehensive metabolic panel  Result Value Ref Range   Sodium 140 135 - 145 mmol/L   Potassium 3.4 (L) 3.5 - 5.1 mmol/L   Chloride 105 98 - 111 mmol/L   CO2 22 22 - 32 mmol/L   Glucose, Bld 134 (H) 70 - 99 mg/dL   BUN 9 8 - 23 mg/dL   Creatinine, Ser 0.89 0.61 - 1.24 mg/dL   Calcium 8.6 (L) 8.9 - 10.3 mg/dL   Total Protein 7.0 6.5 - 8.1 g/dL   Albumin 2.9 (L) 3.5 - 5.0 g/dL   AST 18 15 - 41 U/L   ALT 18 0 - 44 U/L   Alkaline Phosphatase 92 38 - 126 U/L   Total Bilirubin 0.6 0.3 - 1.2 mg/dL   GFR calc non Af Amer >60 >60 mL/min   GFR calc Af Amer >60 >60 mL/min   Anion gap 13 5 - 15  CBC  Result Value Ref Range   WBC 8.9 4.0 - 10.5 K/uL   RBC 3.53 (L) 4.22 - 5.81 MIL/uL   Hemoglobin 10.1 (L) 13.0 - 17.0 g/dL   HCT 33.8 (L) 39.0 - 52.0 %   MCV 95.8 80.0 - 100.0 fL   MCH 28.6 26.0 - 34.0 pg   MCHC 29.9 (L) 30.0 - 36.0 g/dL   RDW 16.3 (H) 11.5 - 15.5 %   Platelets 480 (H) 150 - 400 K/uL   nRBC 0.0 0.0 - 0.2 %   Dg Abdomen Acute W/chest  Result Date: 06/05/2019 CLINICAL DATA:  73 year old male with constipation and lower abdominal pain. EXAM: DG ABDOMEN ACUTE W/ 1V CHEST COMPARISON:  CT  of the abdomen pelvis dated 11/17/2018 FINDINGS: Right pectoral Port-A-Cath with tip at the cavoatrial junction. Areas of peripheral and subpleural hazy densities in the right upper lobe likely represent sclerotic metastasis to the right scapula. The lungs are otherwise clear. There is no pleural effusion or pneumothorax. Mild cardiomegaly. Atherosclerotic calcification of the aorta. There is large amount of stool  throughout the colon. No bowel dilatation or evidence of obstruction. No free air. Degenerative changes of the spine. No acute osseous pathology. Bilateral hip arthroplasties. Prostate fiducial markers. IMPRESSION: 1. No acute cardiopulmonary process. 2. Constipation.  No bowel obstruction. 3. Sclerotic metastasis of the right scapula. Electronically Signed   By: Anner Crete M.D.   On: 06/05/2019 03:02   Nm Xofigo Injection  Result Date: 06/02/2019  Trudi Ida was injected intravenously in Nuclear Medicine under the supervision of the attending radiologist    Procedures Procedures (including critical care time)  Medications Ordered in ED Medications  sodium chloride flush (NS) 0.9 % injection 3 mL (3 mLs Intravenous Not Given 06/05/19 0227)    No signs of surgical abdomen.  Abdomen is soft and non tender and based on auscultation, I suspect patient will have another BM soon.    Long discussion with patient regarding bowel regimen.  I believe the mag citrate has worked.  Patient feels better.  Increase miralax to 3 times a day add dulcolax once a week and suppositories PRN.    Dennis Fischer was evaluated in Emergency Department on 06/05/2019 for the symptoms described in the history of present illness. He was evaluated in the context of the global COVID-19 pandemic, which necessitated consideration that the patient might be at risk for infection with the SARS-CoV-2 virus that causes COVID-19. Institutional protocols and algorithms that pertain to the evaluation of patients at risk for COVID-19 are in a state of rapid change based on information released by regulatory bodies including the CDC and federal and state organizations. These policies and algorithms were followed during the patient's care in the ED.  Final Clinical Impressions(s) / ED Diagnoses   Final diagnoses:  Constipation, unspecified constipation type    Return for intractable cough, coughing up blood,fevers >100.4  unrelieved by medication, shortness of breath, intractable vomiting, chest pain, shortness of breath, weakness,numbness, changes in speech, facial asymmetry,abdominal pain, passing out,Inability to tolerate liquids or food, cough, altered mental status or any concerns. No signs of systemic illness or infection. The patient is nontoxic-appearing on exam and vital signs are within normal limits.   I have reviewed the triage vital signs and the nursing notes. Pertinent labs &imaging results that were available during my care of the patient were reviewed by me and considered in my medical decision making (see chart for details).After history, exam, and medical workup I feel the patient has beenappropriately medically screened and is safe for discharge home. Pertinent diagnoses were discussed with the patient. Patient was given return precautions.    Kolina Kube, MD 06/05/19 [id number]

## 2019-06-05 NOTE — Discharge Instructions (Addendum)
Take miralax 3 times daily for one week and then daily thereafter. Use a glycerin suppository PRN.

## 2019-06-05 NOTE — ED Notes (Addendum)
Pt has attempted to provide a urine sample multiple times with no success. Pt is not complaining of any discomfort.

## 2019-06-09 ENCOUNTER — Other Ambulatory Visit (HOSPITAL_COMMUNITY): Payer: Self-pay | Admitting: Radiation Oncology

## 2019-06-09 ENCOUNTER — Other Ambulatory Visit: Payer: Self-pay | Admitting: Family Medicine

## 2019-06-09 ENCOUNTER — Telehealth: Payer: Self-pay | Admitting: *Deleted

## 2019-06-09 DIAGNOSIS — C61 Malignant neoplasm of prostate: Secondary | ICD-10-CM

## 2019-06-09 NOTE — Telephone Encounter (Signed)
Called back wife after going to the ED last week and was upset about her experience there. She reports that she dropped patient off and was not given a call back to inform her of what was happening. When he was discharged, he was placed in the waiting room to wait for his ride. She was upset because he has active cancer and she did not want him to be exposed to so many people.   For constipation, this was improved on Saturday and patient had a BM (9/26). Wife reports that he is taking senna and mirlax daily.  She also reports that he is having leg pain that is keeping him from sleeping, on top of already being unable to sleep. Patient was recommended melatonin, which he is going to pick up today. Additionally, patient asks for refill of cyclobenzaprine to help with leg pain. Looks like Dr. Osker Mason had ordered this previously for patient. Encouraged patient to call the pharmacy to request a refill for this medication. Also reporting out of calcium supplements, which they will also ask for refill.   Wife also reports that patient has been having chest pain since leaving the hospital on Friday morning. Patient reports that chest pain is pressure like and occurs just when lying down, not with exertion. An episode lasts for about 1 minute. He denies fever, SOB, radiating pain. I have encouraged them to make an appointment to be seen at the office today for evaluation of chest pain as he continues to have short episodes of chest pain.   Dennis Fischer, M.D.  PGY-2  Family Medicine  [phone] 06/09/2019 11:03 AM

## 2019-06-09 NOTE — Telephone Encounter (Signed)
CALLED PATIENT TO INFORM OF LAB AND WEIGHT ON 06-26-19 @ 12 PM @ [facility] ON 07-03-19 - ARRIVAL TIME- 11:45 AM @ [facility], SPOKE WITH PATIENT AND HE IS AWARE OF THESE APPTS.

## 2019-06-23 ENCOUNTER — Other Ambulatory Visit: Payer: Self-pay

## 2019-06-23 DIAGNOSIS — R296 Repeated falls: Secondary | ICD-10-CM

## 2019-06-23 NOTE — Progress Notes (Signed)
Iris Pert from [facility] called and is recommending patient have a referral placed to [facility]. Dr. Alen Blew ok with referral and referral placed.

## 2019-06-25 ENCOUNTER — Telehealth: Payer: Self-pay | Admitting: *Deleted

## 2019-06-25 ENCOUNTER — Other Ambulatory Visit: Payer: Self-pay | Admitting: Radiation Oncology

## 2019-06-25 DIAGNOSIS — C7951 Secondary malignant neoplasm of bone: Secondary | ICD-10-CM

## 2019-06-25 DIAGNOSIS — C7952 Secondary malignant neoplasm of bone marrow: Secondary | ICD-10-CM

## 2019-06-25 NOTE — Telephone Encounter (Signed)
Called patient to remind of lab and weight appt. for 06-26-19 @ 12 pm @ [place] [facility], spoke with patient and he is aware of this appt.

## 2019-06-26 ENCOUNTER — Other Ambulatory Visit: Payer: Self-pay

## 2019-06-26 ENCOUNTER — Ambulatory Visit
Admission: RE | Admit: 2019-06-26 | Discharge: 2019-06-26 | Disposition: A | Discharge status: Home / Self Care | Payer: Medicare Other | Source: Ambulatory Visit | Attending: Radiation Oncology | Admitting: Radiation Oncology

## 2019-06-26 DIAGNOSIS — Z91010 Allergy to peanuts: Secondary | ICD-10-CM | POA: Insufficient documentation

## 2019-06-26 DIAGNOSIS — C61 Malignant neoplasm of prostate: Secondary | ICD-10-CM | POA: Diagnosis not present

## 2019-06-26 DIAGNOSIS — Z888 Allergy status to other drugs, medicaments and biological substances status: Secondary | ICD-10-CM | POA: Insufficient documentation

## 2019-06-26 DIAGNOSIS — Z7952 Long term (current) use of systemic steroids: Secondary | ICD-10-CM | POA: Insufficient documentation

## 2019-06-26 DIAGNOSIS — Z79899 Other long term (current) drug therapy: Secondary | ICD-10-CM | POA: Diagnosis not present

## 2019-06-26 DIAGNOSIS — Z8042 Family history of malignant neoplasm of prostate: Secondary | ICD-10-CM | POA: Insufficient documentation

## 2019-06-26 DIAGNOSIS — Z7982 Long term (current) use of aspirin: Secondary | ICD-10-CM | POA: Insufficient documentation

## 2019-06-26 DIAGNOSIS — C7952 Secondary malignant neoplasm of bone marrow: Secondary | ICD-10-CM | POA: Insufficient documentation

## 2019-06-26 DIAGNOSIS — Z87891 Personal history of nicotine dependence: Secondary | ICD-10-CM | POA: Diagnosis not present

## 2019-06-26 DIAGNOSIS — C7951 Secondary malignant neoplasm of bone: Secondary | ICD-10-CM | POA: Insufficient documentation

## 2019-06-26 DIAGNOSIS — I10 Essential (primary) hypertension: Secondary | ICD-10-CM | POA: Diagnosis not present

## 2019-06-26 LAB — CBC WITH DIFFERENTIAL (CANCER CENTER ONLY)
Abs Immature Granulocytes: 0 10*3/uL (ref 0.00–0.07)
Basophils Absolute: 0 10*3/uL (ref 0.0–0.1)
Basophils Relative: 0 %
Eosinophils Absolute: 0.1 10*3/uL (ref 0.0–0.5)
Eosinophils Relative: 2 %
HCT: 32.6 % — ABNORMAL LOW (ref 39.0–52.0)
Hemoglobin: 9.8 g/dL — ABNORMAL LOW (ref 13.0–17.0)
Immature Granulocytes: 0 %
Lymphocytes Relative: 27 %
Lymphs Abs: 0.7 10*3/uL (ref 0.7–4.0)
MCH: 29.4 pg (ref 26.0–34.0)
MCHC: 30.1 g/dL (ref 30.0–36.0)
MCV: 97.9 fL (ref 80.0–100.0)
Monocytes Absolute: 0.3 10*3/uL (ref 0.1–1.0)
Monocytes Relative: 9 %
Neutro Abs: 1.6 10*3/uL — ABNORMAL LOW (ref 1.7–7.7)
Neutrophils Relative %: 62 %
Platelet Count: 257 10*3/uL (ref 150–400)
RBC: 3.33 MIL/uL — ABNORMAL LOW (ref 4.22–5.81)
RDW: 19.6 % — ABNORMAL HIGH (ref 11.5–15.5)
WBC Count: 2.7 10*3/uL — ABNORMAL LOW (ref 4.0–10.5)
nRBC: 0 % (ref 0.0–0.2)

## 2019-06-29 ENCOUNTER — Other Ambulatory Visit: Payer: Self-pay

## 2019-06-29 ENCOUNTER — Ambulatory Visit: Payer: Medicare Other | Attending: Oncology | Admitting: Physical Therapy

## 2019-06-29 DIAGNOSIS — M6281 Muscle weakness (generalized): Secondary | ICD-10-CM | POA: Insufficient documentation

## 2019-06-29 DIAGNOSIS — R293 Abnormal posture: Secondary | ICD-10-CM

## 2019-06-29 DIAGNOSIS — R2689 Other abnormalities of gait and mobility: Secondary | ICD-10-CM | POA: Diagnosis not present

## 2019-06-30 NOTE — Therapy (Signed)
[facility] [address] [place], [place], [address] Phone: [phone]   Fax:  [phone]  Physical Therapy Evaluation  Patient Details  Name: Dennis Fischer MRN: [id number] Date of Birth: [birth year] Referring Provider (PT): Zola Button, MD   Encounter Date: 06/29/2019  PT End of Session - 06/30/19 0947    Visit Number  1    Number of Visits  17    Date for PT Re-Evaluation  09/28/19   written for 90 day POC   Authorization Type  Medicare - will need 10th visit PN    PT Start Time  1408    PT Stop Time  1450    PT Time Calculation (min)  42 min    Equipment Utilized During Treatment  Gait belt    Activity Tolerance  Patient limited by fatigue;Patient tolerated treatment well    Behavior During Therapy  [facility] for tasks assessed/performed       Past Medical History:  Diagnosis Date  . Acute blood loss anemia   . Hypertension   . Metastatic cancer to bone ([place]) dx'd 2018  . Prostate cancer [facility])     Past Surgical History:  Procedure Laterality Date  . HIP SURGERY Bilateral   . IR IMAGING GUIDED PORT INSERTION  04/22/2018  . LAMINECTOMY N/A 03/09/2019   Procedure: THORACIC SEVEN - THORACIC EIGHT - THORACIC NINE LAMINECTOMY WITH RESECTION OF EPIDURAL TUMOR;  Surgeon: Earnie Larsson, MD;  Location: [facility];  Service: Neurosurgery;  Laterality: N/A;    There were no vitals filed for this visit.      06/29/19 1417  Symptoms/Limitations  Subjective Has severe neuropathy and history of prostate cancer as well as metastatic cancer to spine. Pt states he ended chemotherapy in Jan-Feb of 2020. Underwent additional radiation therapy in July of 2020. He had bilateral lower extremity numbness and has had progressive weakness in his legs - pt is status post epidural compression and decompressive laminectomy completed on March 09, 2019 between T7-T8 and T9 due to cord compression myelopathy[facility]  Recently ended home health - was able to walk down his hallway with a RW. ended last Monday. His last fall approximately 2 weeks ago - thinks he fell after standing. His wife had to call EMS to get him back up. Recently received a hospital bed for home. Uses his w/c to move around his home. Last time he remembers being able to walk by himself was February 2020 (but is very unsure, does not remember).  Pertinent History Prostate cancer, metastatic cancer to spine, HTN, cord compression myelopathy (status post epidural compression and decompressive laminectomy completed on March 09, 2019 )  Limitations Walking;Standing;House hold activities  How long can you walk comfortably? down his hallway at home with home health.  Patient Stated Goals wants to feel more comfortable in the RW, wants to be more independent.  Pain Assessment  Currently in Pain? No/denies    [facility] PT Assessment - 06/29/19 1426      Assessment   Medical Diagnosis  recurrent falls   prostate cancer, metastatic cancer to spine   Referring Provider (PT)  Barrett Shell, MD    Onset Date/Surgical Date  12/07/18    Prior Therapy  [facility], home health PT      Precautions   Precautions  Fall      Balance Screen   Has the patient fallen in the past 6 months  Yes    How many times?  6   fell when trying to walk   Has the patient had a decrease in activity level because of a fear of falling?   Yes    Is the patient reluctant to leave their home because of a fear of falling?   Yes      [place] residence    Living Arrangements  Spouse/significant other   wife, [name]   Available Help at Discharge  Family   wife, Sherol Dade of Home  Apartment    Home Access  Level entry    [place]  One level    Home Equipment  Wheelchair - manual;Shower seat;Walker - standard;Hospital bed      Prior Function   Level of Independence  Independent   prior to March (when he ended chemo)     Cognition    Overall Cognitive Status  No family/caregiver present to determine baseline cognitive functioning   pt taking increased time to answer questions.     Observation/Other Assessments   Observations  pt appears to be a poor historian, does not remember exactly when he was able to walk before home health therapy, is unsure of what therapist and pt discussed 5-10 mins prior      Sensation   Light Touch  Appears Intact    Proprioception  Appears Intact   at B ankles     Coordination   Gross Motor Movements are Fluid and Coordinated  No    Coordination and Movement Description  limited/impaired due to weakness      Posture/Postural Control   Posture/Postural Control  Postural limitations    Postural Limitations  Rounded Shoulders;Forward head;Posterior pelvic tilt;Flexed trunk    Posture Comments  pt sits in wheelchair with increased hip ADD,      ROM / Strength   AROM / PROM / Strength  Strength;AROM      AROM   Overall AROM Comments  limited hamstring ROM in short sitting B      Strength   Strength Assessment Site  Hip;Knee;Ankle    Right/Left Hip  Right;Left    Right Hip Flexion  3+/5    Left Hip Flexion  3+/5    Right/Left Knee  Right;Left    Right Knee Flexion  4/5    Right Knee Extension  4-/5    Left Knee Flexion  4/5    Left Knee Extension  3+/5    Right/Left Ankle  Right;Left    Right Ankle Dorsiflexion  4+/5    Left Ankle Dorsiflexion  4+/5      Transfers   Transfers  Sit to Stand;Stand to Sit    Squat Pivot Transfers  3: Mod assist    Squat Pivot Transfer Details (indicate cue type and reason)  From w/c <> level mat table, cues for head hips relationship, mod A at hips. Has difficulty initating, needs cueing for UE placement     Comments  From pt's w/c, unable to perform sit > stand with multiple attempts and mod A from therapist - pt stating he used to be able to stand with home health from his w/c, however unable to stand today. Verbal cues for UE placement before  attempting to stand. Will attempt at next session from higher mat table.       Ambulation/Gait   Ambulation/Gait  No    Gait Comments  unable to stand to assess today -per pt report he was able to ambulate  down his hallway at home with RW.                Objective measurements completed on examination: See above findings.       06/30/19 1555  PT Education  Education Details clinical findings, POC  Person(s) Educated Patient  Methods Explanation  Comprehension Verbalized understanding;Need further instruction         PT Short Term Goals - 06/30/19 1003      PT SHORT TERM GOAL #1   Title  Pt and pt's caregiver/wife will be independent with initial HEP for LE strengthening and ROM. ALL STGS DUE 07/30/19    Time  4    Period  Weeks    Status  New    Target Date  07/30/19      PT SHORT TERM GOAL #2   Title  Patient will perform squat pivot transfer from w/c <> mat table with min guard in order to decrease caregiver burden.    Baseline  currently requires mod A    Time  4    Period  Weeks    Status  New      PT SHORT TERM GOAL #3   Title  Pt will perform log roll, sidelying to sit with correct mechanics and min A in order to demonstrate safe mechanics for spine and to decrease caregiver burden.    Baseline  not yet assessed.    Time  4    Period  Weeks    Status  New      PT SHORT TERM GOAL #4   Title  Pt will perform sit <> stand from RW with min A from w/c vs. higher mat table in order to demonstrate improved LE strength and improve weight bearing.    Baseline  unable to stand today with mod A from w/c    Time  4    Period  Weeks    Status  New           PT Long Term Goals - 06/30/19 1006      PT LONG TERM GOAL #1   Title  Pt and pt's caregiver/wife will be independent with initial HEP for LE strengthening and ROM. ALL LTGS DUE 08/29/19    Time  8    Period  Weeks    Status  New    Target Date  08/29/19      PT LONG TERM GOAL #2   Title   Patient will ambulate at least 2' with w/c follow and min guard in order to demonstrate improved household mobility.    Baseline  not yet assessed.    Time  8    Period  Weeks    Status  New      PT LONG TERM GOAL #3   Title  Pt will perform sit <> stand from RW from w/c vs. mat table with supervision in order to demo improved functional transfers.    Time  8    Period  Weeks    Status  New      PT LONG TERM GOAL #4   Title  Patient will tolerate static standing x5 minutes at countertop with BUE support in order to improve tolerance for ADLs.    Time  8    Period  Weeks    Status  New      PT LONG TERM GOAL #5   Title  Patient will perform all bed mobility with mod I in order  to decrease caregiver burden.    Time  8    Period  Weeks    Status  New                06/30/19 [id number]  Plan  Clinical Impression Statement Patient is a 73 year old male referred to Neuro OPPT for evaluation with primary concern of BLE weakness and debility.  Has severe neuropathy and history of prostate cancer as well as metastatic cancer to spine. Pt states he ended chemotherapy in Jan-Feb of 2020. Underwent additional radiation therapy in July of 2020. He had bilateral lower extremity numbness and has had progressive weakness in his legs - pt is status post epidural compression and decompressive laminectomy completed on March 09, 2019 between T7-T8 and T9 due to cord compression myelopathy. Pt's PMH is significant for: HTN, prostate cancer.  The following deficits were present during the exam: impaired coordination, decreased LE strength, impaired ROM, unable to perform sit <> stands from w/c with RW,  decreased mobility with functional transfers, postural deficits. Pt required mod A today for squat pivot transfer from w/c <> mat table. Pt reports he was able to walk with a RW with home health recently. Pt would benefit from skilled PT to address these impairments and functional limitations to maximize  functional mobility independence  Personal Factors and Comorbidities Age;Comorbidity 2;Past/Current Experience;Time since onset of injury/illness/exacerbation  Comorbidities Prostate cancer, metastatic cancer to spine, cord compression myelopathy (status post epidural compression and decompressive laminectomy completed on March 09, 2019 )  Examination-Activity Limitations Bed Mobility;Transfers;Stand;Squat;Locomotion Level  Examination-Participation Restrictions Community Activity  Pt will benefit from skilled therapeutic intervention in order to improve on the following deficits Decreased activity tolerance;Decreased balance;Decreased endurance;Decreased range of motion;Decreased mobility;Decreased coordination;Decreased strength;Difficulty walking;Abnormal gait;Postural dysfunction  Stability/Clinical Decision Making Evolving/Moderate complexity  Clinical Decision Making Moderate  Rehab Potential Fair  PT Frequency 2x / week  PT Duration 8 weeks  PT Treatment/Interventions ADLs/Self Care Home Management;DME Instruction;Gait training;Stair training;Functional mobility training;Neuromuscular re-education;Balance training;Therapeutic exercise;Therapeutic activities;Patient/family education;Wheelchair mobility training;Passive range of motion;Energy conservation  PT Next Visit Plan assess sit <> stand from higher mat table after performing squat pivot transfer from w/c to mat table, gait with RW? will need a 2nd person for wheelchair follow, depends on pt's standing balance, unable to assess at eval. standing balance with RW. any seated exercises for initial HEP. [place] for LE strengthening/ROM  Consulted and Agree with Plan of Care Patient   Patient will benefit from skilled therapeutic intervention in order to improve the following deficits and impairments:  Decreased activity tolerance, Decreased balance, Decreased endurance, Decreased range of motion, Decreased mobility, Decreased coordination,  Decreased strength, Difficulty walking, Abnormal gait, Postural dysfunction  Visit Diagnosis: Muscle weakness (generalized)  Other abnormalities of gait and mobility  Abnormal posture     Problem List Patient Active Problem List   Diagnosis Date Noted  . Constipation 05/15/2019  . Elevated BUN   . Transaminitis   . Hypoalbuminemia due to protein-calorie malnutrition ([place])   . Steroid-induced hyperglycemia   . Essential hypertension   . Metastatic cancer to spine ([place]) 03/11/2019  . Neuropathic pain   . Benign essential HTN   . Cocaine use 03/10/2019  . Cord compression myelopathy ([place])   . Spinal cord compression ([place]) 03/09/2019  . Prostate cancer metastatic to bone ([place]) 12/22/2018  . Port-A-Cath in place 07/02/2018  . Goals of care, counseling/discussion 01/20/2018  . Right knee pain 01/17/2018  . Prostate cancer ([place]) 01/22/2017  . Prediabetes  03/30/2016  . Positive FIT (fecal immunochemical test) 11/22/2015  . GERD (gastroesophageal reflux disease) 12/21/2014  . Age-related nuclear cataract of both eyes 01/13/2013  . Hypertensive retinopathy 01/13/2013  . Essential (primary) hypertension 11/25/2012  . Pure hypercholesterolemia 11/25/2012  . History of total hip replacement 08/28/2010  . Tobacco dependence syndrome 03/22/2006    Arliss Journey, PT, DPT  06/30/2019, 10:10 AM  [facility] [address] [place], [place], [address] Phone: [phone]   Fax:  [phone]  Name: Andros Lenker MRN: [id number] Date of Birth: [birth year]

## 2019-07-01 ENCOUNTER — Ambulatory Visit: Payer: Medicare Other | Admitting: Physical Therapy

## 2019-07-01 ENCOUNTER — Other Ambulatory Visit: Payer: Self-pay

## 2019-07-01 ENCOUNTER — Encounter: Payer: Self-pay | Admitting: Physical Therapy

## 2019-07-01 DIAGNOSIS — R293 Abnormal posture: Secondary | ICD-10-CM

## 2019-07-01 DIAGNOSIS — M6281 Muscle weakness (generalized): Secondary | ICD-10-CM | POA: Diagnosis not present

## 2019-07-01 DIAGNOSIS — R2689 Other abnormalities of gait and mobility: Secondary | ICD-10-CM | POA: Diagnosis not present

## 2019-07-01 NOTE — Patient Instructions (Signed)
Access Code: [email or web address]  URL: [email or web address]  Date: 07/01/2019  Prepared by: Willow Ora   Exercises Sit to Stand - 10 reps - 1 sets - 1x daily - 5x weekly Heel rises with counter support - 10 reps - 1 sets - 1x daily - 5x weekly

## 2019-07-02 ENCOUNTER — Telehealth: Payer: Self-pay | Admitting: *Deleted

## 2019-07-02 NOTE — Therapy (Signed)
[facility] [address] [place], [place], [address] Phone: [phone]   Fax:  [phone]  Physical Therapy Treatment  Patient Details  Name: Dennis Fischer MRN: [id number] Date of Birth: 1946-01-15 Referring Provider (PT): Barrett Shell, MD   Encounter Date: 07/01/2019  PT End of Session - 07/01/19 1408    Visit Number  2    Number of Visits  17    Date for PT Re-Evaluation  09/28/19   written for 90 day POC   Authorization Type  Medicare - will need 10th visit PN    PT Start Time  1402    PT Stop Time  1445    PT Time Calculation (min)  43 min    Equipment Utilized During Treatment  Gait belt    Activity Tolerance  Patient limited by fatigue;Patient tolerated treatment well    Behavior During Therapy  [facility] for tasks assessed/performed       Past Medical History:  Diagnosis Date  . Acute blood loss anemia   . Hypertension   . Metastatic cancer to bone ([place]) dx'd 2018  . Prostate cancer [facility])     Past Surgical History:  Procedure Laterality Date  . HIP SURGERY Bilateral   . IR IMAGING GUIDED PORT INSERTION  04/22/2018  . LAMINECTOMY N/A 03/09/2019   Procedure: THORACIC SEVEN - THORACIC EIGHT - THORACIC NINE LAMINECTOMY WITH RESECTION OF EPIDURAL TUMOR;  Surgeon: Earnie Larsson, MD;  Location: [facility];  Service: Neurosurgery;  Laterality: N/A;    There were no vitals filed for this visit.  Subjective Assessment - 07/01/19 1406    Subjective  No new complaitns. No falls or pain to report. Reports standing with his aide at home to dress, does not walk with them.    Pertinent History  Prostate cancer, metastatic cancer to spine, HTN, cord compression myelopathy (status post epidural compression and decompressive laminectomy completed on March 09, 2019 )    Limitations  Walking;Standing;House hold activities    How long can you walk comfortably?  down his hallway at home with home health.    Patient Stated Goals   wants to feel more comfortable in the RW, wants to be more independent.    Currently in Pain?  No/denies           07/01/19 1409  Transfers  Transfers Sit to Stand;Stand to [employer] Transfers  Sit to Stand 4: Min assist;With upper extremity assist;From chair/3-in-1  Sit to Stand Details Tactile cues for weight shifting;Verbal cues for technique;Verbal cues for sequencing;Verbal cues for safe use of DME/AE;Manual facilitation for weight shifting  Stand to Sit 3: Mod assist;With upper extremity assist;To bed  Stand to Sit Details (indicate cue type and reason) Tactile cues for weight shifting;Verbal cues for sequencing;Verbal cues for technique;Verbal cues for precautions/safety;Verbal cues for safe use of DME/AE;Manual facilitation for weight shifting  Stand Pivot Transfers 4: Min assist;3: Mod assist  Stand Pivot Transfer Details (indicate cue type and reason) with RW: from w/c to mat table at lowest level with mod assist to safely turn/back up to mat table, then from mat table to w/c with min assist   Squat Pivot Transfers 4: Min assist  Squat Pivot Transfer Details (indicate cue type and reason) W/C <> high/low mat table with armrest flipped back out of the way so pt could demo how he transfers at home.    Number of Reps Other reps (comment)  Comments stood multilple times in session: to RW  several times, to table x1, to sink x1  Exercises  Exercises Other Exercises  Other Exercises  initiated an HEP to begin to address strengthening. refer to [facility] program for full details.     issued the following to pt's HEP:  Access Code: [email or web address]  URL: [email or web address]  Date: 07/01/2019  Prepared by: Willow Ora   Exercises Sit to Stand - 10 reps - 1 sets - 1x daily - 5x weekly Heel rises with counter support - 10 reps - 1 sets - 1x daily - 5x weekly     07/01/19 1630  PT Education  Education Details transfer traning, initaited HEP  Person(s) Educated Patient   Methods Explanation;Demonstration  Comprehension Verbalized understanding;Returned demonstration;Need further instruction;Verbal cues required      PT Short Term Goals - 06/30/19 1003      PT SHORT TERM GOAL #1   Title  Pt and pt's caregiver/wife will be independent with initial HEP for LE strengthening and ROM. ALL STGS DUE 07/30/19    Time  4    Period  Weeks    Status  New    Target Date  07/30/19      PT SHORT TERM GOAL #2   Title  Patient will perform squat pivot transfer from w/c <> mat table with min guard in order to decrease caregiver burden.    Baseline  currently requires mod A    Time  4    Period  Weeks    Status  New      PT SHORT TERM GOAL #3   Title  Pt will perform log roll, sidelying to sit with correct mechanics and min A in order to demonstrate safe mechanics for spine and to decrease caregiver burden.    Baseline  not yet assessed.    Time  4    Period  Weeks    Status  New      PT SHORT TERM GOAL #4   Title  Pt will perform sit <> stand from RW with min A from w/c vs. higher mat table in order to demonstrate improved LE strength and improve weight bearing.    Baseline  unable to stand today with mod A from w/c    Time  4    Period  Weeks    Status  New        PT Long Term Goals - 06/30/19 1006      PT LONG TERM GOAL #1   Title  Pt and pt's caregiver/wife will be independent with initial HEP for LE strengthening and ROM. ALL LTGS DUE 08/29/19    Time  8    Period  Weeks    Status  New    Target Date  08/29/19      PT LONG TERM GOAL #2   Title  Patient will ambulate at least 73' with w/c follow and min guard in order to demonstrate improved household mobility.    Baseline  not yet assessed.    Time  8    Period  Weeks    Status  New      PT LONG TERM GOAL #3   Title  Pt will perform sit <> stand from RW from w/c vs. mat table with supervision in order to demo improved functional transfers.    Time  8    Period  Weeks    Status  New       PT LONG TERM GOAL #4   Title  Patient will  tolerate static standing x5 minutes at countertop with BUE support in order to improve tolerance for ADLs.    Time  8    Period  Weeks    Status  New      PT LONG TERM GOAL #5   Title  Patient will perform all bed mobility with mod I in order to decrease caregiver burden.    Time  8    Period  Weeks    Status  New         07/01/19 1408  Plan  Clinical Impression Statement Today's skilled session focused on transfer training and initiation of an HEP for strengthening. Pt with improved transfers today with less assistance needed than with evaluation. Does demo retropulsion with standing, will need further training to address this. The pt is progressing toward goals and should benefit from continued PT to progress toward unmet goals.  Personal Factors and Comorbidities Age;Comorbidity 2;Past/Current Experience;Time since onset of injury/illness/exacerbation  Comorbidities Prostate cancer, metastatic cancer to spine, cord compression myelopathy (status post epidural compression and decompressive laminectomy completed on March 09, 2019 )  Examination-Activity Limitations Bed Mobility;Transfers;Stand;Squat;Locomotion Level  Examination-Participation Restrictions Community Activity  Pt will benefit from skilled therapeutic intervention in order to improve on the following deficits Decreased activity tolerance;Decreased balance;Decreased endurance;Decreased range of motion;Decreased mobility;Decreased coordination;Decreased strength;Difficulty walking;Abnormal gait;Postural dysfunction  Stability/Clinical Decision Making Evolving/Moderate complexity  Rehab Potential Fair  PT Frequency 2x / week  PT Duration 8 weeks  PT Treatment/Interventions ADLs/Self Care Home Management;DME Instruction;Gait training;Stair training;Functional mobility training;Neuromuscular re-education;Balance training;Therapeutic exercise;Therapeutic activities;Patient/family  education;Wheelchair mobility training;Passive range of motion;Energy conservation  PT Next Visit Plan continue to work on transfer training with emphasis on weight shifting/decreased retropulsion; add to HEP as able.  Consulted and Agree with Plan of Care Patient          Patient will benefit from skilled therapeutic intervention in order to improve the following deficits and impairments:  Decreased activity tolerance, Decreased balance, Decreased endurance, Decreased range of motion, Decreased mobility, Decreased coordination, Decreased strength, Difficulty walking, Abnormal gait, Postural dysfunction  Visit Diagnosis: Muscle weakness (generalized)  Other abnormalities of gait and mobility  Abnormal posture     Problem List Patient Active Problem List   Diagnosis Date Noted  . Constipation 05/15/2019  . Elevated BUN   . Transaminitis   . Hypoalbuminemia due to protein-calorie malnutrition ([place])   . Steroid-induced hyperglycemia   . Essential hypertension   . Metastatic cancer to spine ([place]) 03/11/2019  . Neuropathic pain   . Benign essential HTN   . Cocaine use 03/10/2019  . Cord compression myelopathy ([place])   . Spinal cord compression ([place]) 03/09/2019  . Prostate cancer metastatic to bone ([place]) 12/22/2018  . Port-A-Cath in place 07/02/2018  . Goals of care, counseling/discussion 01/20/2018  . Right knee pain 01/17/2018  . Prostate cancer ([place]) 01/22/2017  . Prediabetes 03/30/2016  . Positive FIT (fecal immunochemical test) 11/22/2015  . GERD (gastroesophageal reflux disease) 12/21/2014  . Age-related nuclear cataract of both eyes 01/13/2013  . Hypertensive retinopathy 01/13/2013  . Essential (primary) hypertension 11/25/2012  . Pure hypercholesterolemia 11/25/2012  . History of total hip replacement 08/28/2010  . Tobacco dependence syndrome 03/22/2006    Willow Ora, PTA, [facility]patient [facility] [address] [place], [place]  [address] [phone] 07/02/19, 4:48 PM   Name: Monique Hoeg MRN: [id number] Date of Birth: Feb 26, 1946

## 2019-07-02 NOTE — Telephone Encounter (Signed)
CALLED PATIENT TO REMIND OF XOFIGO INJ. FOR 07-03-19 - ARRIVAL TIME- 11:45 AM @ [facility], SPOKE WITH PATIENT AND HE IS AWARE OF THIS INJ.

## 2019-07-03 ENCOUNTER — Other Ambulatory Visit: Payer: Self-pay

## 2019-07-03 ENCOUNTER — Ambulatory Visit (HOSPITAL_COMMUNITY)
Admission: RE | Admit: 2019-07-03 | Discharge: 2019-07-03 | Disposition: A | Discharge status: Home / Self Care | Payer: Medicare Other | Source: Ambulatory Visit | Attending: Radiation Oncology | Admitting: Radiation Oncology

## 2019-07-03 DIAGNOSIS — C7951 Secondary malignant neoplasm of bone: Secondary | ICD-10-CM | POA: Diagnosis not present

## 2019-07-03 DIAGNOSIS — C61 Malignant neoplasm of prostate: Secondary | ICD-10-CM | POA: Insufficient documentation

## 2019-07-03 MED ORDER — RADIUM RA 223 DICHLORIDE 30 MCCI/ML IV SOLN
110.7800 | Freq: Once | INTRAVENOUS | Status: AC | PRN
  Administered 2019-07-03: 110.78 via INTRAVENOUS

## 2019-07-15 ENCOUNTER — Other Ambulatory Visit: Payer: Self-pay

## 2019-07-15 ENCOUNTER — Ambulatory Visit: Payer: Medicare Other | Attending: Oncology | Admitting: Physical Therapy

## 2019-07-15 ENCOUNTER — Encounter: Payer: Self-pay | Admitting: Physical Therapy

## 2019-07-15 VITALS — BP 117/71 | HR 132

## 2019-07-15 DIAGNOSIS — M6281 Muscle weakness (generalized): Secondary | ICD-10-CM | POA: Diagnosis not present

## 2019-07-15 DIAGNOSIS — R293 Abnormal posture: Secondary | ICD-10-CM | POA: Diagnosis not present

## 2019-07-15 DIAGNOSIS — R2689 Other abnormalities of gait and mobility: Secondary | ICD-10-CM | POA: Diagnosis not present

## 2019-07-15 NOTE — Patient Instructions (Signed)
Access Code: [email or web address]  URL: [email or web address]  Date: 07/15/2019  Prepared by: Janann August   Exercises Sit to Stand - 10 reps - 1 sets - 1x daily - 5x weekly Heel rises with counter support - 10 reps - 1 sets - 1x daily - 5x weekly Seated Hip Abduction with Resistance - 10 reps - 2 sets - 2x daily                            - 7x weekly Seated Hamstring Curl with Anchored Resistance - 10 reps - 2 sets - 2x daily - 7x weekly Seated Heel Toe Raises - 10 reps - 3 sets - 2x daily - 7x weekly

## 2019-07-15 NOTE — Therapy (Signed)
[facility] [address] [place], [place], [address] Phone: [phone]   Fax:  [phone]  Physical Therapy Treatment  Patient Details  Name: Dennis Fischer MRN: [id number] Date of Birth: 1946/05/20 Referring Provider (PT): Barrett Shell, MD   Encounter Date: 07/15/2019  PT End of Session - 07/15/19 2037    Visit Number  3    Number of Visits  17    Date for PT Re-Evaluation  09/28/19   written for 90 day POC   Authorization Type  Medicare - will need 10th visit PN    PT Start Time  1530    PT Stop Time  1613    PT Time Calculation (min)  43 min    Equipment Utilized During Treatment  Gait belt    Activity Tolerance  Patient limited by fatigue;Patient tolerated treatment well    Behavior During Therapy  [facility] for tasks assessed/performed       Past Medical History:  Diagnosis Date  . Acute blood loss anemia   . Hypertension   . Metastatic cancer to bone ([place]) dx'd 2018  . Prostate cancer [facility])     Past Surgical History:  Procedure Laterality Date  . HIP SURGERY Bilateral   . IR IMAGING GUIDED PORT INSERTION  04/22/2018  . LAMINECTOMY N/A 03/09/2019   Procedure: THORACIC SEVEN - THORACIC EIGHT - THORACIC NINE LAMINECTOMY WITH RESECTION OF EPIDURAL TUMOR;  Surgeon: Earnie Larsson, MD;  Location: [facility];  Service: Neurosurgery;  Laterality: N/A;    Vitals:   07/15/19 1603  BP: 117/71  Pulse: (!) 132    Subjective Assessment - 07/15/19 1533    Subjective  No falls or pain. Reports that he has been doing his standing exercises, but not as much as he would like.    Pertinent History  Prostate cancer, metastatic cancer to spine, HTN, cord compression myelopathy (status post epidural compression and decompressive laminectomy completed on March 09, 2019 )    Limitations  Walking;Standing;House hold activities    How long can you walk comfortably?  down his hallway at home with home health.    Patient Stated Goals   wants to feel more comfortable in the RW, wants to be more independent.    Currently in Pain?  No/denies                       [facility] Adult PT Treatment/Exercise - 07/15/19 0001      Transfers   Transfers  Sit to Stand;Stand to Sit;Stand Pivot Transfers    Sit to Stand  4: Min assist;With upper extremity assist;From elevated surface    Sit to Stand Details  Tactile cues for weight shifting;Verbal cues for technique;Verbal cues for sequencing;Verbal cues for safe use of DME/AE;Manual facilitation for weight shifting;Visual cues/gestures for precautions/safety    Sit to Stand Details (indicate cue type and reason)  performed 3 reps from higher mat table with RW, cues for hand placement, pt with tendency to retropulse initially in standing, during 3rd rep, pt with difficulty keeping a narrow BOS in standing and unable to stand erect initially.     Stand to Sit  4: Min assist;With upper extremity assist    Stand to Sit Details (indicate cue type and reason)  Tactile cues for weight shifting;Verbal cues for sequencing;Verbal cues for technique;Verbal cues for precautions/safety;Verbal cues for safe use of DME/AE;Manual facilitation for weight shifting    Stand Pivot Transfers  4: Min assist  Stand Pivot Transfer Details (indicate cue type and reason)  with RW: from mat table back to w/c, cues for weight shifting     Squat Pivot Transfers  4: Min assist    Squat Pivot Transfer Details (indicate cue type and reason)  W/c to level mat table     Comments  additional 2x sit to stands at countertop sink with pt using BUE to pull to stand.          Added seated exercises to HEP:  Access Code: [email or web address]  URL: [email or web address]  Date: 07/15/2019  Prepared by: Janann August   Exercises Sit to Stand - 10 reps - 1 sets - 1x daily - 5x weekly Heel rises with counter support - 10 reps - 1 sets - 1x daily - 5x weekly Seated Hip Abduction with Resistance - 10 reps - 2  sets - 2x daily                            - 7x weekly Seated Hamstring Curl with Anchored Resistance - 10 reps - 2 sets - 2x daily - 7x weekly Seated Heel Toe Raises - 10 reps - 3 sets - 2x daily - 7x weekly   PT Education - 07/15/19 2037    Education Details  transfer training, added seated resisted theraband exercises to perform at home with caregiver.    Person(s) Educated  Patient    Methods  Explanation;Demonstration;Handout    Comprehension  Verbalized understanding;Returned demonstration;Need further instruction       PT Short Term Goals - 06/30/19 1003      PT SHORT TERM GOAL #1   Title  Pt and pt's caregiver/wife will be independent with initial HEP for LE strengthening and ROM. ALL STGS DUE 07/30/19    Time  4    Period  Weeks    Status  New    Target Date  07/30/19      PT SHORT TERM GOAL #2   Title  Patient will perform squat pivot transfer from w/c <> mat table with min guard in order to decrease caregiver burden.    Baseline  currently requires mod A    Time  4    Period  Weeks    Status  New      PT SHORT TERM GOAL #3   Title  Pt will perform log roll, sidelying to sit with correct mechanics and min A in order to demonstrate safe mechanics for spine and to decrease caregiver burden.    Baseline  not yet assessed.    Time  4    Period  Weeks    Status  New      PT SHORT TERM GOAL #4   Title  Pt will perform sit <> stand from RW with min A from w/c vs. higher mat table in order to demonstrate improved LE strength and improve weight bearing.    Baseline  unable to stand today with mod A from w/c    Time  4    Period  Weeks    Status  New        PT Long Term Goals - 06/30/19 1006      PT LONG TERM GOAL #1   Title  Pt and pt's caregiver/wife will be independent with initial HEP for LE strengthening and ROM. ALL LTGS DUE 08/29/19    Time  8    Period  Weeks  Status  New    Target Date  08/29/19      PT LONG TERM GOAL #2   Title  Patient will  ambulate at least 16' with w/c follow and min guard in order to demonstrate improved household mobility.    Baseline  not yet assessed.    Time  8    Period  Weeks    Status  New      PT LONG TERM GOAL #3   Title  Pt will perform sit <> stand from RW from w/c vs. mat table with supervision in order to demo improved functional transfers.    Time  8    Period  Weeks    Status  New      PT LONG TERM GOAL #4   Title  Patient will tolerate static standing x5 minutes at countertop with BUE support in order to improve tolerance for ADLs.    Time  8    Period  Weeks    Status  New      PT LONG TERM GOAL #5   Title  Patient will perform all bed mobility with mod I in order to decrease caregiver burden.    Time  8    Period  Weeks    Status  New            Plan - 07/15/19 2042    Clinical Impression Statement  Focus of today's skilled session focused on transfer training, additions to HEP for LE strengthening, sit <> stands, and standing tolerance. After standing activites, pt's HR at 132 bpm - able to decrease back down to 115-120 bpm with seated rest breaks. Pt is deconditioned and has difficulty performing 3 consecutive sit <> stands from high mat table. Questionable participation with HEP at home - at beginning of session pt states he does not get up enough with his aides to stand, but when asked later he said he is getting plenty of standing in at home. Pt is deconditioned and will continue to benefit from skilled PT to address functional transfers, activity tolerance, LE strengthening, and endurance.    Personal Factors and Comorbidities  Age;Comorbidity 2;Past/Current Experience;Time since onset of injury/illness/exacerbation    Comorbidities  Prostate cancer, metastatic cancer to spine, cord compression myelopathy (status post epidural compression and decompressive laminectomy completed on March 09, 2019 )    Examination-Activity Limitations  Bed  Mobility;Transfers;Stand;Squat;Locomotion Level    Examination-Participation Restrictions  Community Activity    Stability/Clinical Decision Making  Evolving/Moderate complexity    Rehab Potential  Fair    PT Frequency  2x / week    PT Duration  8 weeks    PT Treatment/Interventions  ADLs/Self Care Home Management;DME Instruction;Gait training;Stair training;Functional mobility training;Neuromuscular re-education;Balance training;Therapeutic exercise;Therapeutic activities;Patient/family education;Wheelchair mobility training;Passive range of motion;Energy conservation    PT Next Visit Plan  monitor HR with exercise. sit <> stands at sink/high mat table, standing balance/weight shifting with BUE support at sink, maybe Scifit for endurance and strengthening, continue to work on transfer training with emphasis on weight shifting/decreased retropulsion; how was HEP?    Consulted and Agree with Plan of Care  Patient       Patient will benefit from skilled therapeutic intervention in order to improve the following deficits and impairments:  Decreased activity tolerance, Decreased balance, Decreased endurance, Decreased range of motion, Decreased mobility, Decreased coordination, Decreased strength, Difficulty walking, Abnormal gait, Postural dysfunction  Visit Diagnosis: Muscle weakness (generalized)  Other abnormalities of gait and mobility  Abnormal posture     Problem List Patient Active Problem List   Diagnosis Date Noted  . Constipation 05/15/2019  . Elevated BUN   . Transaminitis   . Hypoalbuminemia due to protein-calorie malnutrition ([place])   . Steroid-induced hyperglycemia   . Essential hypertension   . Metastatic cancer to spine ([place]) 03/11/2019  . Neuropathic pain   . Benign essential HTN   . Cocaine use 03/10/2019  . Cord compression myelopathy ([place])   . Spinal cord compression ([place]) 03/09/2019  . Prostate cancer metastatic to bone ([place]) 12/22/2018  . Port-A-Cath in place  07/02/2018  . Goals of care, counseling/discussion 01/20/2018  . Right knee pain 01/17/2018  . Prostate cancer ([place]) 01/22/2017  . Prediabetes 03/30/2016  . Positive FIT (fecal immunochemical test) 11/22/2015  . GERD (gastroesophageal reflux disease) 12/21/2014  . Age-related nuclear cataract of both eyes 01/13/2013  . Hypertensive retinopathy 01/13/2013  . Essential (primary) hypertension 11/25/2012  . Pure hypercholesterolemia 11/25/2012  . History of total hip replacement 08/28/2010  . Tobacco dependence syndrome 03/22/2006    Arliss Journey, PT, DPT  07/15/2019, 8:46 PM  [facility] [address] [place], [place], [address] Phone: [phone]   Fax:  [phone]  Name: Dennis Fischer MRN: [id number] Date of Birth: [birth year]

## 2019-07-16 ENCOUNTER — Other Ambulatory Visit: Payer: Self-pay | Admitting: Student in an Organized Health Care Education/Training Program

## 2019-07-20 ENCOUNTER — Other Ambulatory Visit: Payer: Self-pay

## 2019-07-20 ENCOUNTER — Ambulatory Visit: Payer: Medicare Other

## 2019-07-20 VITALS — HR 100

## 2019-07-20 DIAGNOSIS — M6281 Muscle weakness (generalized): Secondary | ICD-10-CM

## 2019-07-20 DIAGNOSIS — R293 Abnormal posture: Secondary | ICD-10-CM | POA: Diagnosis not present

## 2019-07-20 DIAGNOSIS — R2689 Other abnormalities of gait and mobility: Secondary | ICD-10-CM

## 2019-07-20 NOTE — Therapy (Signed)
[facility] [address] [place], [place], [address] Phone: [phone]   Fax:  [phone]  Physical Therapy Treatment  Patient Details  Name: Dennis Fischer MRN: [id number] Date of Birth: Jul 15, 1946 Referring Provider (PT): Barrett Shell, MD   Encounter Date: 07/20/2019  PT End of Session - 07/20/19 1316    Visit Number  4    Number of Visits  17    Date for PT Re-Evaluation  09/28/19   written for 90 day POC   Authorization Type  Medicare - will need 10th visit PN    PT Start Time  1316    PT Stop Time  1356    PT Time Calculation (min)  40 min    Equipment Utilized During Treatment  Gait belt    Activity Tolerance  Patient limited by fatigue;Patient tolerated treatment well    Behavior During Therapy  [facility] for tasks assessed/performed       Past Medical History:  Diagnosis Date  . Acute blood loss anemia   . Hypertension   . Metastatic cancer to bone ([place]) dx'd 2018  . Prostate cancer [facility])     Past Surgical History:  Procedure Laterality Date  . HIP SURGERY Bilateral   . IR IMAGING GUIDED PORT INSERTION  04/22/2018  . LAMINECTOMY N/A 03/09/2019   Procedure: THORACIC SEVEN - THORACIC EIGHT - THORACIC NINE LAMINECTOMY WITH RESECTION OF EPIDURAL TUMOR;  Surgeon: Earnie Larsson, MD;  Location: [facility];  Service: Neurosurgery;  Laterality: N/A;    Vitals:   07/20/19 1321  Pulse: 100    Subjective Assessment - 07/20/19 1316    Subjective  Pt reports he is doing ok. Denies any falls or pain.    Pertinent History  Prostate cancer, metastatic cancer to spine, HTN, cord compression myelopathy (status post epidural compression and decompressive laminectomy completed on March 09, 2019 )    Limitations  Walking;Standing;House hold activities    How long can you walk comfortably?  down his hallway at home with home health.    Patient Stated Goals  wants to feel more comfortable in the RW, wants to be more independent.     Currently in Pain?  No/denies                       [facility] Adult PT Treatment/Exercise - 07/20/19 1321      Transfers   Transfers  Sit to Stand;Stand to Sit;Squat Pivot Transfers    Sit to Stand  4: Min guard;4: Min assist    Sit to Stand Details  Verbal cues for technique    Stand to Sit  4: Min guard    Squat Pivot Transfers  5: Supervision    Squat Pivot Transfer Details (indicate cue type and reason)  w/c to mat    Comments  Sit to stand x 5 from elevated mat CGA. HR=112 after.. Sit to stand from w/c x 3 duirng session min assist.       Ambulation/Gait   Ambulation/Gait  Yes    Ambulation/Gait Assistance  4: Min assist    Ambulation/Gait Assistance Details  Verbal cues to watch foot placement. Pt unsteady at times with ataxic gait.    Ambulation Distance (Feet)  15 Feet   20' x 1   Assistive device  Rolling walker   w/c follow   Gait Pattern  Step-through pattern;Ataxic;Decreased step length - right;Decreased step length - left    Ambulation Surface  Level;Indoor  Gait Comments  Pt's HR=116 after first bout of gait. Took seated rest break between bouts.       Neuro Re-ed    Neuro Re-ed Details   Standing at walker with 1 UE support with reaching x 30 sec each side. Standing at walker with CGA/min assist touching 2 different colored spots on floor on command x ~6 each LE.       Exercises   Exercises  Other Exercises    Other Exercises   Seated LAQ 5 x 2 bilateral with manual resistance, seated hip flexion x 10 bilateral.              PT Education - 07/20/19 1536    Education Details  Pt to continue with current HEP.    Person(s) Educated  Patient    Methods  Explanation    Comprehension  Verbalized understanding       PT Short Term Goals - 06/30/19 1003      PT SHORT TERM GOAL #1   Title  Pt and pt's caregiver/wife will be independent with initial HEP for LE strengthening and ROM. ALL STGS DUE 07/30/19    Time  4    Period  Weeks    Status   New    Target Date  07/30/19      PT SHORT TERM GOAL #2   Title  Patient will perform squat pivot transfer from w/c <> mat table with min guard in order to decrease caregiver burden.    Baseline  currently requires mod A    Time  4    Period  Weeks    Status  New      PT SHORT TERM GOAL #3   Title  Pt will perform log roll, sidelying to sit with correct mechanics and min A in order to demonstrate safe mechanics for spine and to decrease caregiver burden.    Baseline  not yet assessed.    Time  4    Period  Weeks    Status  New      PT SHORT TERM GOAL #4   Title  Pt will perform sit <> stand from RW with min A from w/c vs. higher mat table in order to demonstrate improved LE strength and improve weight bearing.    Baseline  unable to stand today with mod A from w/c    Time  4    Period  Weeks    Status  New        PT Long Term Goals - 06/30/19 1006      PT LONG TERM GOAL #1   Title  Pt and pt's caregiver/wife will be independent with initial HEP for LE strengthening and ROM. ALL LTGS DUE 08/29/19    Time  8    Period  Weeks    Status  New    Target Date  08/29/19      PT LONG TERM GOAL #2   Title  Patient will ambulate at least 60' with w/c follow and min guard in order to demonstrate improved household mobility.    Baseline  not yet assessed.    Time  8    Period  Weeks    Status  New      PT LONG TERM GOAL #3   Title  Pt will perform sit <> stand from RW from w/c vs. mat table with supervision in order to demo improved functional transfers.    Time  8  Period  Weeks    Status  New      PT LONG TERM GOAL #4   Title  Patient will tolerate static standing x5 minutes at countertop with BUE support in order to improve tolerance for ADLs.    Time  8    Period  Weeks    Status  New      PT LONG TERM GOAL #5   Title  Patient will perform all bed mobility with mod I in order to decrease caregiver burden.    Time  8    Period  Weeks    Status  New             Plan - 07/20/19 1537    Clinical Impression Statement  Pt's HR remained in lower 100s throughout visit today. Able to transfer with less assist and initiate gait at session today. Pt was ataxic with gait and unsteady.    Personal Factors and Comorbidities  Age;Comorbidity 2;Past/Current Experience;Time since onset of injury/illness/exacerbation    Comorbidities  Prostate cancer, metastatic cancer to spine, cord compression myelopathy (status post epidural compression and decompressive laminectomy completed on March 09, 2019 )    Examination-Activity Limitations  Bed Mobility;Transfers;Stand;Squat;Locomotion Level    Examination-Participation Restrictions  Community Activity    Stability/Clinical Decision Making  Evolving/Moderate complexity    Rehab Potential  Fair    PT Frequency  2x / week    PT Duration  8 weeks    PT Treatment/Interventions  ADLs/Self Care Home Management;DME Instruction;Gait training;Stair training;Functional mobility training;Neuromuscular re-education;Balance training;Therapeutic exercise;Therapeutic activities;Patient/family education;Wheelchair mobility training;Passive range of motion;Energy conservation    PT Next Visit Plan  Monitor HR with session. See how felt after last session. Continue with standing balance, gait and strengthening.    Consulted and Agree with Plan of Care  Patient       Patient will benefit from skilled therapeutic intervention in order to improve the following deficits and impairments:  Decreased activity tolerance, Decreased balance, Decreased endurance, Decreased range of motion, Decreased mobility, Decreased coordination, Decreased strength, Difficulty walking, Abnormal gait, Postural dysfunction  Visit Diagnosis: Muscle weakness (generalized)  Other abnormalities of gait and mobility     Problem List Patient Active Problem List   Diagnosis Date Noted  . Constipation 05/15/2019  . Elevated BUN   . Transaminitis   .  Hypoalbuminemia due to protein-calorie malnutrition ([place])   . Steroid-induced hyperglycemia   . Essential hypertension   . Metastatic cancer to spine ([place]) 03/11/2019  . Neuropathic pain   . Benign essential HTN   . Cocaine use 03/10/2019  . Cord compression myelopathy ([place])   . Spinal cord compression ([place]) 03/09/2019  . Prostate cancer metastatic to bone ([place]) 12/22/2018  . Port-A-Cath in place 07/02/2018  . Goals of care, counseling/discussion 01/20/2018  . Right knee pain 01/17/2018  . Prostate cancer ([place]) 01/22/2017  . Prediabetes 03/30/2016  . Positive FIT (fecal immunochemical test) 11/22/2015  . GERD (gastroesophageal reflux disease) 12/21/2014  . Age-related nuclear cataract of both eyes 01/13/2013  . Hypertensive retinopathy 01/13/2013  . Essential (primary) hypertension 11/25/2012  . Pure hypercholesterolemia 11/25/2012  . History of total hip replacement 08/28/2010  . Tobacco dependence syndrome 03/22/2006    Electa Sniff, PT, DPT, NCS 07/20/2019, 3:40 PM  [facility] [address] [place], [place], [address] Phone: [phone]   Fax:  [phone]  Name: Dennis Fischer MRN: [id number] Date of Birth: 1946/06/28

## 2019-07-22 ENCOUNTER — Ambulatory Visit: Payer: Medicare Other | Admitting: Physical Therapy

## 2019-07-24 ENCOUNTER — Other Ambulatory Visit: Payer: Self-pay

## 2019-07-24 ENCOUNTER — Inpatient Hospital Stay: Payer: Medicare Other | Attending: Oncology

## 2019-07-24 ENCOUNTER — Inpatient Hospital Stay: Payer: Medicare Other

## 2019-07-24 DIAGNOSIS — C61 Malignant neoplasm of prostate: Secondary | ICD-10-CM | POA: Diagnosis not present

## 2019-07-24 LAB — CMP (CANCER CENTER ONLY)
ALT: 13 U/L (ref 0–44)
AST: 13 U/L — ABNORMAL LOW (ref 15–41)
Albumin: 3.2 g/dL — ABNORMAL LOW (ref 3.5–5.0)
Alkaline Phosphatase: 73 U/L (ref 38–126)
Anion gap: 10 (ref 5–15)
BUN: 8 mg/dL (ref 8–23)
CO2: 25 mmol/L (ref 22–32)
Calcium: 8.6 mg/dL — ABNORMAL LOW (ref 8.9–10.3)
Chloride: 107 mmol/L (ref 98–111)
Creatinine: 0.7 mg/dL (ref 0.61–1.24)
GFR, Est AFR Am: >60 mL/min (ref >60)
GFR, Estimated: >60 mL/min (ref >60)
Glucose, Bld: 104 mg/dL — ABNORMAL HIGH (ref 70–99)
Potassium: 3.3 mmol/L — ABNORMAL LOW (ref 3.5–5.1)
Sodium: 142 mmol/L (ref 135–145)
Total Bilirubin: 0.3 mg/dL (ref 0.3–1.2)
Total Protein: 6.6 g/dL (ref 6.5–8.1)

## 2019-07-24 LAB — CBC WITH DIFFERENTIAL (CANCER CENTER ONLY)
Abs Immature Granulocytes: 0.01 10*3/uL (ref 0.00–0.07)
Basophils Absolute: 0 10*3/uL (ref 0.0–0.1)
Basophils Relative: 0 %
Eosinophils Absolute: 0.1 10*3/uL (ref 0.0–0.5)
Eosinophils Relative: 3 %
HCT: 31 % — ABNORMAL LOW (ref 39.0–52.0)
Hemoglobin: 10.1 g/dL — ABNORMAL LOW (ref 13.0–17.0)
Immature Granulocytes: 0 %
Lymphocytes Relative: 29 %
Lymphs Abs: 0.9 10*3/uL (ref 0.7–4.0)
MCH: 30 pg (ref 26.0–34.0)
MCHC: 32.6 g/dL (ref 30.0–36.0)
MCV: 92 fL (ref 80.0–100.0)
Monocytes Absolute: 0.3 10*3/uL (ref 0.1–1.0)
Monocytes Relative: 10 %
Neutro Abs: 1.9 10*3/uL (ref 1.7–7.7)
Neutrophils Relative %: 58 %
Platelet Count: 196 10*3/uL (ref 150–400)
RBC: 3.37 MIL/uL — ABNORMAL LOW (ref 4.22–5.81)
RDW: 18.1 % — ABNORMAL HIGH (ref 11.5–15.5)
WBC Count: 3.3 10*3/uL — ABNORMAL LOW (ref 4.0–10.5)
nRBC: 0 % (ref 0.0–0.2)

## 2019-07-25 LAB — PROSTATE-SPECIFIC AG, SERUM (LABCORP): Prostate Specific Ag, Serum: 63.3 ng/mL — ABNORMAL HIGH (ref 0.0–4.0)

## 2019-07-28 ENCOUNTER — Ambulatory Visit: Payer: Medicare Other | Admitting: Physical Therapy

## 2019-07-28 ENCOUNTER — Other Ambulatory Visit: Payer: Self-pay

## 2019-07-28 DIAGNOSIS — M6281 Muscle weakness (generalized): Secondary | ICD-10-CM | POA: Diagnosis not present

## 2019-07-28 DIAGNOSIS — R2689 Other abnormalities of gait and mobility: Secondary | ICD-10-CM | POA: Diagnosis not present

## 2019-07-28 DIAGNOSIS — R293 Abnormal posture: Secondary | ICD-10-CM

## 2019-07-28 NOTE — Therapy (Signed)
[facility] [address] [place], [place], [address] Phone: [phone]   Fax:  [phone]  Physical Therapy Treatment  Patient Details  Name: Dennis Fischer MRN: [id number] Date of Birth: 06-25-1946 Referring Provider (PT): Barrett Shell, MD   Encounter Date: 07/28/2019  PT End of Session - 07/28/19 1924    Visit Number  5    Number of Visits  17    Date for PT Re-Evaluation  09/28/19   written for 90 day POC   Authorization Type  Medicare - will need 10th visit PN    PT Start Time  1530    PT Stop Time  1615    PT Time Calculation (min)  45 min    Equipment Utilized During Treatment  Gait belt    Activity Tolerance  Patient limited by fatigue;Patient tolerated treatment well    Behavior During Therapy  [facility] for tasks assessed/performed       Past Medical History:  Diagnosis Date  . Acute blood loss anemia   . Hypertension   . Metastatic cancer to bone ([place]) dx'd 2018  . Prostate cancer [facility])     Past Surgical History:  Procedure Laterality Date  . HIP SURGERY Bilateral   . IR IMAGING GUIDED PORT INSERTION  04/22/2018  . LAMINECTOMY N/A 03/09/2019   Procedure: THORACIC SEVEN - THORACIC EIGHT - THORACIC NINE LAMINECTOMY WITH RESECTION OF EPIDURAL TUMOR;  Surgeon: Earnie Larsson, MD;  Location: [place];  Service: Neurosurgery;  Laterality: N/A;    There were no vitals filed for this visit.  Subjective Assessment - 07/28/19 1923    Subjective  Pt reports he is doing ok. Denies any falls or pain. Felt okay after last session    Pertinent History  Prostate cancer, metastatic cancer to spine, HTN, cord compression myelopathy (status post epidural compression and decompressive laminectomy completed on March 09, 2019 )    Limitations  Walking;Standing;House hold activities    How long can you walk comfortably?  down his hallway at home with home health.    Patient Stated Goals  wants to feel more comfortable in the  RW, wants to be more independent.    Currently in Pain?  No/denies                       [facility] Adult PT Treatment/Exercise - 07/28/19 0001      Transfers   Transfers  Sit to Stand;Stand to Sit;Squat Pivot Transfers    Sit to Stand  4: Min guard;4: Min Nurse, children's Transfers  5: Supervision    Squat Pivot Transfer Details (indicate cue type and reason)  w/c to chair      Ambulation/Gait   Gait Comments  ambulated with RW 15 ft with mod A for balance SPO2 93 and HR 140. After long rest break about 3 min then ambulated another 15 ft SPO2 90% and HR 138. He needs cues for step length, hip, knee flexion with gait.      High Level Balance   High Level Balance Comments  Sit to stand at sink pulling with UE's supervision then balance no UE support feet apart 1 min, then seated break and repeated for feet together one minute balance. Overall supervision to mod A for balance as has posterior lean and lacks ankle strategy or hip strategy.      Exercises   Other Exercises   HR 131, SPO2 93 after 3 min  on sci fit bike UE/LE L1. Then performed another 2 min after 1 min rest                PT Short Term Goals - 06/30/19 1003      PT SHORT TERM GOAL #1   Title  Pt and pt's caregiver/wife will be independent with initial HEP for LE strengthening and ROM. ALL STGS DUE 07/30/19    Time  4    Period  Weeks    Status  New    Target Date  07/30/19      PT SHORT TERM GOAL #2   Title  Patient will perform squat pivot transfer from w/c <> mat table with min guard in order to decrease caregiver burden.    Baseline  currently requires mod A    Time  4    Period  Weeks    Status  New      PT SHORT TERM GOAL #3   Title  Pt will perform log roll, sidelying to sit with correct mechanics and min A in order to demonstrate safe mechanics for spine and to decrease caregiver burden.    Baseline  not yet assessed.    Time  4    Period  Weeks    Status  New      PT SHORT TERM  GOAL #4   Title  Pt will perform sit <> stand from RW with min A from w/c vs. higher mat table in order to demonstrate improved LE strength and improve weight bearing.    Baseline  unable to stand today with mod A from w/c    Time  4    Period  Weeks    Status  New        PT Long Term Goals - 06/30/19 1006      PT LONG TERM GOAL #1   Title  Pt and pt's caregiver/wife will be independent with initial HEP for LE strengthening and ROM. ALL LTGS DUE 08/29/19    Time  8    Period  Weeks    Status  New    Target Date  08/29/19      PT LONG TERM GOAL #2   Title  Patient will ambulate at least 100' with w/c follow and min guard in order to demonstrate improved household mobility.    Baseline  not yet assessed.    Time  8    Period  Weeks    Status  New      PT LONG TERM GOAL #3   Title  Pt will perform sit <> stand from RW from w/c vs. mat table with supervision in order to demo improved functional transfers.    Time  8    Period  Weeks    Status  New      PT LONG TERM GOAL #4   Title  Patient will tolerate static standing x5 minutes at countertop with BUE support in order to improve tolerance for ADLs.    Time  8    Period  Weeks    Status  New      PT LONG TERM GOAL #5   Title  Patient will perform all bed mobility with mod I in order to decrease caregiver burden.    Time  8    Period  Weeks    Status  New            Plan - 07/28/19 1926    Clinical  Impression Statement  Progressed activity today and ambulation with RW with mod A for balance. HR spiked up to 140 at most with increased activity and he needs frequent rest breaks due to fatigue. PT will continue to progress as able.    Personal Factors and Comorbidities  Age;Comorbidity 2;Past/Current Experience;Time since onset of injury/illness/exacerbation    Comorbidities  Prostate cancer, metastatic cancer to spine, cord compression myelopathy (status post epidural compression and decompressive laminectomy completed  on March 09, 2019 )    Examination-Activity Limitations  Bed Mobility;Transfers;Stand;Squat;Locomotion Level    Examination-Participation Restrictions  Community Activity    Stability/Clinical Decision Making  Evolving/Moderate complexity    Rehab Potential  Fair    PT Frequency  2x / week    PT Duration  8 weeks    PT Treatment/Interventions  ADLs/Self Care Home Management;DME Instruction;Gait training;Stair training;Functional mobility training;Neuromuscular re-education;Balance training;Therapeutic exercise;Therapeutic activities;Patient/family education;Wheelchair mobility training;Passive range of motion;Energy conservation    PT Next Visit Plan  Monitor HR with session. See how felt after last session. Continue with standing balance, gait and strengthening.    Consulted and Agree with Plan of Care  Patient       Patient will benefit from skilled therapeutic intervention in order to improve the following deficits and impairments:  Decreased activity tolerance, Decreased balance, Decreased endurance, Decreased range of motion, Decreased mobility, Decreased coordination, Decreased strength, Difficulty walking, Abnormal gait, Postural dysfunction  Visit Diagnosis: Muscle weakness (generalized)  Other abnormalities of gait and mobility  Abnormal posture     Problem List Patient Active Problem List   Diagnosis Date Noted  . Constipation 05/15/2019  . Elevated BUN   . Transaminitis   . Hypoalbuminemia due to protein-calorie malnutrition ([place])   . Steroid-induced hyperglycemia   . Essential hypertension   . Metastatic cancer to spine ([place]) 03/11/2019  . Neuropathic pain   . Benign essential HTN   . Cocaine use 03/10/2019  . Cord compression myelopathy ([place])   . Spinal cord compression ([place]) 03/09/2019  . Prostate cancer metastatic to bone ([place]) 12/22/2018  . Port-A-Cath in place 07/02/2018  . Goals of care, counseling/discussion 01/20/2018  . Right knee pain 01/17/2018  .  Prostate cancer ([place]) 01/22/2017  . Prediabetes 03/30/2016  . Positive FIT (fecal immunochemical test) 11/22/2015  . GERD (gastroesophageal reflux disease) 12/21/2014  . Age-related nuclear cataract of both eyes 01/13/2013  . Hypertensive retinopathy 01/13/2013  . Essential (primary) hypertension 11/25/2012  . Pure hypercholesterolemia 11/25/2012  . History of total hip replacement 08/28/2010  . Tobacco dependence syndrome 03/22/2006    Silvestre Mesi 07/28/2019, 7:38 PM  [facility] [address] [place], [place], [address] Phone: [phone]   Fax:  [phone]  Name: Dennis Fischer MRN: [id number] Date of Birth: 12/31/1945

## 2019-07-29 DIAGNOSIS — M4714 Other spondylosis with myelopathy, thoracic region: Secondary | ICD-10-CM | POA: Diagnosis not present

## 2019-07-30 ENCOUNTER — Other Ambulatory Visit: Payer: Self-pay

## 2019-07-30 ENCOUNTER — Ambulatory Visit: Payer: Medicare Other | Admitting: Physical Therapy

## 2019-07-30 DIAGNOSIS — R293 Abnormal posture: Secondary | ICD-10-CM

## 2019-07-30 DIAGNOSIS — R2689 Other abnormalities of gait and mobility: Secondary | ICD-10-CM

## 2019-07-30 DIAGNOSIS — M6281 Muscle weakness (generalized): Secondary | ICD-10-CM | POA: Diagnosis not present

## 2019-07-30 NOTE — Therapy (Signed)
[facility] [address] [place], [place], [address] Phone: [phone]   Fax:  [phone]  Physical Therapy Treatment  Patient Details  Name: Dennis Fischer MRN: [id number] Date of Birth: Jan 31, 1946 Referring Provider (PT): Barrett Shell, MD   Encounter Date: 07/30/2019  PT End of Session - 07/30/19 2220    Visit Number  6    Number of Visits  17    Date for PT Re-Evaluation  09/28/19   written for 90 day POC   Authorization Type  Medicare - will need 10th visit PN    PT Start Time  1400    PT Stop Time  1445    PT Time Calculation (min)  45 min    Equipment Utilized During Treatment  Gait belt    Activity Tolerance  Patient limited by fatigue;Patient tolerated treatment well    Behavior During Therapy  [facility] for tasks assessed/performed       Past Medical History:  Diagnosis Date  . Acute blood loss anemia   . Hypertension   . Metastatic cancer to bone ([place]) dx'd 2018  . Prostate cancer [facility])     Past Surgical History:  Procedure Laterality Date  . HIP SURGERY Bilateral   . IR IMAGING GUIDED PORT INSERTION  04/22/2018  . LAMINECTOMY N/A 03/09/2019   Procedure: THORACIC SEVEN - THORACIC EIGHT - THORACIC NINE LAMINECTOMY WITH RESECTION OF EPIDURAL TUMOR;  Surgeon: Earnie Larsson, MD;  Location: [place];  Service: Neurosurgery;  Laterality: N/A;    There were no vitals filed for this visit.  Subjective Assessment - 07/30/19 1438    Subjective  Pt reports he is doing ok. Denies pain    Pertinent History  Prostate cancer, metastatic cancer to spine, HTN, cord compression myelopathy (status post epidural compression and decompressive laminectomy completed on March 09, 2019 )    Limitations  Walking;Standing;House hold activities    How long can you walk comfortably?  down his hallway at home with home health.    Patient Stated Goals  wants to feel more comfortable in the RW, wants to be more independent.                        [place] Adult PT Treatment/Exercise - 07/30/19 0001      Exercises   Other Exercises   HR 111, SPO2 98 after 3 min on sci fit bike UE/LE L1. Then performed another 3 min after 1 min rest (97%, 114) Then ambulated with RW 15 ft with mod A for balance SPO2 93 and HR 140. After long rest break about 3 min then ambulated another 15 ft SPO2 90% and HR 138. He needs cues for step length, hip, knee flexion with gait. SIt to stands X at sink working on balance standing feet apart(CGA), feet almost together (CGA to min A) and sidestepping mod A for balance               PT Short Term Goals - 06/30/19 1003      PT SHORT TERM GOAL #1   Title  Pt and pt's caregiver/wife will be independent with initial HEP for LE strengthening and ROM. ALL STGS DUE 07/30/19    Time  4    Period  Weeks    Status  New    Target Date  07/30/19      PT SHORT TERM GOAL #2   Title  Patient will perform squat pivot transfer from  w/c <> mat table with min guard in order to decrease caregiver burden.    Baseline  currently requires mod A    Time  4    Period  Weeks    Status  New      PT SHORT TERM GOAL #3   Title  Pt will perform log roll, sidelying to sit with correct mechanics and min A in order to demonstrate safe mechanics for spine and to decrease caregiver burden.    Baseline  not yet assessed.    Time  4    Period  Weeks    Status  New      PT SHORT TERM GOAL #4   Title  Pt will perform sit <> stand from RW with min A from w/c vs. higher mat table in order to demonstrate improved LE strength and improve weight bearing.    Baseline  unable to stand today with mod A from w/c    Time  4    Period  Weeks    Status  New        PT Long Term Goals - 06/30/19 1006      PT LONG TERM GOAL #1   Title  Pt and pt's caregiver/wife will be independent with initial HEP for LE strengthening and ROM. ALL LTGS DUE 08/29/19    Time  8    Period  Weeks    Status  New    Target  Date  08/29/19      PT LONG TERM GOAL #2   Title  Patient will ambulate at least 54' with w/c follow and min guard in order to demonstrate improved household mobility.    Baseline  not yet assessed.    Time  8    Period  Weeks    Status  New      PT LONG TERM GOAL #3   Title  Pt will perform sit <> stand from RW from w/c vs. mat table with supervision in order to demo improved functional transfers.    Time  8    Period  Weeks    Status  New      PT LONG TERM GOAL #4   Title  Patient will tolerate static standing x5 minutes at countertop with BUE support in order to improve tolerance for ADLs.    Time  8    Period  Weeks    Status  New      PT LONG TERM GOAL #5   Title  Patient will perform all bed mobility with mod I in order to decrease caregiver burden.    Time  8    Period  Weeks    Status  New            Plan - 07/30/19 2222    Clinical Impression Statement  HR more stable during sesison today. Continued to work on endurance, balance, gait. PT will continue to progress as able toward his funcitonal goals.    Personal Factors and Comorbidities  Age;Comorbidity 2;Past/Current Experience;Time since onset of injury/illness/exacerbation    Comorbidities  Prostate cancer, metastatic cancer to spine, cord compression myelopathy (status post epidural compression and decompressive laminectomy completed on March 09, 2019 )    Examination-Activity Limitations  Bed Mobility;Transfers;Stand;Squat;Locomotion Level    Examination-Participation Restrictions  Community Activity    Stability/Clinical Decision Making  Evolving/Moderate complexity    Rehab Potential  Fair    PT Frequency  2x / week    PT Duration  8 weeks    PT Treatment/Interventions  ADLs/Self Care Home Management;DME Instruction;Gait training;Stair training;Functional mobility training;Neuromuscular re-education;Balance training;Therapeutic exercise;Therapeutic activities;Patient/family education;Wheelchair mobility  training;Passive range of motion;Energy conservation    PT Next Visit Plan  Monitor HR with session. Continue with standing balance, gait and strengthening.    Consulted and Agree with Plan of Care  Patient       Patient will benefit from skilled therapeutic intervention in order to improve the following deficits and impairments:  Decreased activity tolerance, Decreased balance, Decreased endurance, Decreased range of motion, Decreased mobility, Decreased coordination, Decreased strength, Difficulty walking, Abnormal gait, Postural dysfunction  Visit Diagnosis: Muscle weakness (generalized)  Other abnormalities of gait and mobility  Abnormal posture     Problem List Patient Active Problem List   Diagnosis Date Noted  . Constipation 05/15/2019  . Elevated BUN   . Transaminitis   . Hypoalbuminemia due to protein-calorie malnutrition ([place])   . Steroid-induced hyperglycemia   . Essential hypertension   . Metastatic cancer to spine ([place]) 03/11/2019  . Neuropathic pain   . Benign essential HTN   . Cocaine use 03/10/2019  . Cord compression myelopathy ([place])   . Spinal cord compression ([place]) 03/09/2019  . Prostate cancer metastatic to bone ([place]) 12/22/2018  . Port-A-Cath in place 07/02/2018  . Goals of care, counseling/discussion 01/20/2018  . Right knee pain 01/17/2018  . Prostate cancer ([place]) 01/22/2017  . Prediabetes 03/30/2016  . Positive FIT (fecal immunochemical test) 11/22/2015  . GERD (gastroesophageal reflux disease) 12/21/2014  . Age-related nuclear cataract of both eyes 01/13/2013  . Hypertensive retinopathy 01/13/2013  . Essential (primary) hypertension 11/25/2012  . Pure hypercholesterolemia 11/25/2012  . History of total hip replacement 08/28/2010  . Tobacco dependence syndrome 03/22/2006    Silvestre Mesi 07/30/2019, 10:25 PM  [facility] [address] [place], [place], [address] Phone:  [phone]   Fax:  [phone]  Name: Paden Hayden MRN: [id number] Date of Birth: [birth year]

## 2019-07-31 ENCOUNTER — Other Ambulatory Visit: Payer: Self-pay

## 2019-07-31 ENCOUNTER — Inpatient Hospital Stay (HOSPITAL_BASED_OUTPATIENT_CLINIC_OR_DEPARTMENT_OTHER): Payer: Medicare Other | Admitting: Oncology

## 2019-07-31 VITALS — BP 121/76 | HR 100 | Temp 98.3°F | Resp 18 | Ht 72.0 in | Wt 170.0 lb

## 2019-07-31 DIAGNOSIS — C61 Malignant neoplasm of prostate: Secondary | ICD-10-CM | POA: Diagnosis not present

## 2019-07-31 DIAGNOSIS — R1110 Vomiting, unspecified: Secondary | ICD-10-CM | POA: Diagnosis not present

## 2019-07-31 DIAGNOSIS — K6289 Other specified diseases of anus and rectum: Secondary | ICD-10-CM | POA: Diagnosis not present

## 2019-07-31 DIAGNOSIS — C7951 Secondary malignant neoplasm of bone: Secondary | ICD-10-CM | POA: Diagnosis not present

## 2019-07-31 DIAGNOSIS — Z66 Do not resuscitate: Secondary | ICD-10-CM | POA: Diagnosis not present

## 2019-07-31 DIAGNOSIS — N390 Urinary tract infection, site not specified: Secondary | ICD-10-CM | POA: Diagnosis not present

## 2019-07-31 DIAGNOSIS — R112 Nausea with vomiting, unspecified: Secondary | ICD-10-CM | POA: Diagnosis not present

## 2019-07-31 DIAGNOSIS — Z1611 Resistance to penicillins: Secondary | ICD-10-CM | POA: Diagnosis not present

## 2019-07-31 DIAGNOSIS — A419 Sepsis, unspecified organism: Secondary | ICD-10-CM | POA: Diagnosis not present

## 2019-07-31 DIAGNOSIS — Z20828 Contact with and (suspected) exposure to other viral communicable diseases: Secondary | ICD-10-CM | POA: Diagnosis not present

## 2019-07-31 DIAGNOSIS — K5900 Constipation, unspecified: Secondary | ICD-10-CM | POA: Diagnosis not present

## 2019-07-31 DIAGNOSIS — R000 Tachycardia, unspecified: Secondary | ICD-10-CM | POA: Diagnosis not present

## 2019-07-31 MED ORDER — POLYETHYLENE GLYCOL 3350 17 G PO PACK: 17.0000 g | PACK | Freq: Every day | ORAL | 3 refills | Status: DC | PRN

## 2019-07-31 MED ORDER — SENNOSIDES-DOCUSATE SODIUM 8.6-50 MG PO TABS: 2.0000 | ORAL_TABLET | Freq: Two times a day (BID) | ORAL | 1 refills | Status: AC

## 2019-07-31 NOTE — Progress Notes (Signed)
Hematology and Oncology Follow Up Visit  Dennis Fischer [id number] 30-Jul-1946 73 y.o. 07/31/2019 10:05 AM Dennis Fischer, MDKim, Dennis Quale, MD   Principle Diagnosis: 73 year old man with castration-resistant prostate cancer diagnosed in 2018.  He has a documented bony metastasis.    Prior Therapy: He received definitive therapy using radiation therapy and androgen deprivation in 2016.  He developed advanced disease with pelvic adenopathy and was treated with androgen deprivation while was living in [place].  He subsequently developed castration resistant disease.  Zytiga 1000 mg daily restarted on 03/14/2017.  Therapy discontinued in August 2018 due to progression of disease.  Taxotere chemotherapy at 75 mg/m given every 3 weeks started on the first 2019.  He completed 8 cycles of therapy in January 2020.  He is status post epidural compression and decompressive laminectomy completed on March 09, 2019 between T7-T8 and T9 completed by Dr. Trenton Gammon.  He completed additional radiation therapy in the form of 10 fractions for a total of 30 Gy in July 2020.  Xofigo started on December 16, 2018.  He completed 6 months of therapy on July 03, 2019.  Current therapy:   Lupron 30 mg every 4 months.  Last Lupron given on in September 2020.       Interim History:  Dennis Fischer returns today for a repeat evaluation.  Since the last visit, he reports overall improvement in his general health.  He is limited in his mobility and still requiring a lot of assistance to transfer care lower extremity weakness but does have a walker which she occasionally uses.  He does report to issues with constipation but no abdominal pain or vomiting.  He reports that has significant gas but no actual bowel movement.  He has used over-the-counter laxatives without any improvement.  His appetite has been reasonable and does not report any back pain or pathological fractures.  He denies any falls.   He denied any alteration  mental status, neuropathy, confusion or dizziness.  Denies any headaches or lethargy.  Denies any night sweats, weight loss or changes in appetite.  Denied orthopnea, dyspnea on exertion or chest discomfort.  Denies shortness of breath, difficulty breathing hemoptysis or cough.  Denies any abdominal distention, nausea, early satiety or dyspepsia.  Denies any hematuria, frequency, dysuria or nocturia.  Denies any skin irritation, dryness or rash.  Denies any ecchymosis or petechiae.  Denies any lymphadenopathy or clotting.  Denies any heat or cold intolerance.  Denies any anxiety or depression.  Remaining review of system is negative.                Medications: Updated without any changes. Current Outpatient Medications  Medication Sig Dispense Refill  . acetaminophen (TYLENOL) 325 MG tablet Take 1-2 tablets (325-650 mg total) by mouth every 4 (four) hours as needed for mild pain.    Marland Kitchen amLODipine (NORVASC) 10 MG tablet TAKE 1 TABLET(10 MG) BY MOUTH DAILY (Patient taking differently: Take 10 mg by mouth daily. ) 90 tablet 3  . CALCIUM 500/D 500-200 MG-UNIT tablet TAKE 1 TABLET BY MOUTH TWICE DAILY (Patient taking differently: Take 1 tablet by mouth daily with breakfast. ) 60 tablet 3  . carvedilol (COREG) 12.5 MG tablet TAKE 1 TABLET BY MOUTH TWICE DAILY WITH A MEAL (Patient taking differently: Take 12.5 mg by mouth 2 (two) times daily with a meal. ) 180 tablet 3  . cyclobenzaprine (FLEXERIL) 10 MG tablet TAKE 1 TABLET BY MOUTH THREE TIMES DAILY AS NEEDED FOR MUSCLE SPASMS 30  tablet 0  . dexamethasone (DECADRON) 2 MG tablet Take 1 tablet (2 mg total) by mouth every 12 (twelve) hours. (Patient taking differently: Take 2 mg by mouth 2 (two) times daily as needed. Nausea) 60 tablet 1  . gabapentin (NEURONTIN) 300 MG capsule Take 1 capsule (300 mg total) by mouth at bedtime. 30 capsule 5  . HYDROcodone-acetaminophen (NORCO) 5-325 MG tablet Take 1 tablet by mouth every 6 (six) hours as needed for  moderate pain. (Patient not taking: Reported on 06/05/2019) 30 tablet 0  . lidocaine-prilocaine (EMLA) cream Apply 1 application topically as needed. (Patient taking differently: Apply 1 application topically as needed (numbing). ) 30 g 0  . megestrol (MEGACE) 400 MG/10ML suspension Take 10 mLs (400 mg total) by mouth 2 (two) times daily. (Patient not taking: Reported on 06/05/2019) 240 mL 0  . Multiple Vitamin (MULTIVITAMIN WITH MINERALS) TABS tablet Take 1 tablet by mouth daily.    . ondansetron (ZOFRAN) 4 MG tablet Take 1 tablet (4 mg total) by mouth every 6 (six) hours as needed for nausea or vomiting. 20 tablet 0  . pantoprazole (PROTONIX) 40 MG tablet Take 1 tablet (40 mg total) by mouth daily.    . polyethylene glycol (MIRALAX / GLYCOLAX) 17 g packet Take 17 g by mouth daily. (Patient taking differently: Take 17 g by mouth daily as needed for mild constipation. ) 30 each 1  . prochlorperazine (COMPAZINE) 10 MG tablet TAKE 1 TABLET BY MOUTH EVERY 6 HOURS AS NEEDED FOR NAUSEA OR VOMITING (Patient taking differently: Take 10 mg by mouth every 6 (six) hours as needed for nausea or vomiting. ) 368 tablet 3  . senna-docusate (SENOKOT-S) 8.6-50 MG tablet Take 2 tablets by mouth at bedtime. (Patient taking differently: Take 2 tablets by mouth at bedtime as needed for mild constipation. ) 60 tablet 1  . simvastatin (ZOCOR) 20 MG tablet Take 1 tablet (20 mg total) by mouth daily. 90 tablet 3   No current facility-administered medications for this visit.      Allergies:  Allergies  Allergen Reactions  . Lisinopril Swelling    Facial swelling  . Cat Hair Extract     eyes swell  . Mangifera Indica     MANGO --  . Mango Flavor     throat swells with mango  . Other   . Peanut-Containing Drug Products   . Pistachio Nut (Diagnostic)     hands and feet burn/itch  . Sunflower Oil     Sunflower seed allergy    Past Medical History, Surgical history, Social history, and Family History unchanged on  review.    Physical Exam:    Blood pressure 121/76, pulse 100, temperature 98.3 F (36.8 C), temperature source Temporal, resp. rate 18, height 6' (1.829 m), weight 170 lb (77.1 kg), SpO2 100 %.      ECOG: 2    General appearance: Alert, awake without any distress. Head: Atraumatic without abnormalities Oropharynx: Without any thrush or ulcers. Eyes: No scleral icterus. Lymph nodes: No lymphadenopathy noted in the cervical, supraclavicular, or axillary nodes Heart:regular rate and rhythm, without any murmurs or gallops.   Lung: Clear to auscultation without any rhonchi, wheezes or dullness to percussion. Abdomin: Soft, nontender without any shifting dullness or ascites. Musculoskeletal: No clubbing or cyanosis. Neurological: No motor or sensory deficits. Skin: No rashes or lesions.              Lab Results: Lab Results  Component Value Date   WBC 3.3 (  L) 07/24/2019   HGB 10.1 (L) 07/24/2019   HCT 31.0 (L) 07/24/2019   MCV 92.0 07/24/2019   PLT 196 07/24/2019     Chemistry      Component Value Date/Time   NA 142 07/24/2019 1100   NA 141 08/08/2017 1116   K 3.3 (L) 07/24/2019 1100   K 3.6 08/08/2017 1116   CL 107 07/24/2019 1100   CO2 25 07/24/2019 1100   CO2 24 08/08/2017 1116   BUN 8 07/24/2019 1100   BUN 13.2 08/08/2017 1116   CREATININE 0.70 07/24/2019 1100   CREATININE 1.1 08/08/2017 1116      Component Value Date/Time   CALCIUM 8.6 (L) 07/24/2019 1100   CALCIUM 9.1 08/08/2017 1116   ALKPHOS 73 07/24/2019 1100   ALKPHOS 214 (H) 08/08/2017 1116   AST 13 (L) 07/24/2019 1100   AST 17 08/08/2017 1116   ALT 13 07/24/2019 1100   ALT 17 08/08/2017 1116   BILITOT 0.3 07/24/2019 1100   BILITOT 0.29 08/08/2017 1116         Results for Dennis Fischer, Dennis Fischer (MRN [id number]) as of 07/31/2019 09:46  Ref. Range 11/05/2018 07:46 05/22/2019 12:02 07/24/2019 11:00  Prostate Specific Ag, Serum Latest Ref Range: 0.0 - 4.0 ng/mL 48.3 (H) 232.0 (H) 63.3 (H)          Impression and Plan:  73 year old man with:   1.  Advanced prostate cancer with disease to the bone that is currently castration-resistant diagnosed in 2018.  The natural course of his disease was reviewed today as well as future treatment options.  His PSA did increase dramatically in September up to 232 and currently declining to 11.  This could be an effect related to his surgery and radiation as well as Xofigo.  Treatment options for the future were reiterated which include returning to systemic chemotherapy or potentially a PARP inhibitor pending his guardant 360 mutation analysis.  For the time being I recommended continued active surveillance and continued recovery from his recent hospitalizations and illnesses.   2. Androgen depravation: He received Lupron in September 2020 and will receive his next injection in January 2021.  I recommended continuing this indefinitely.   3.  Anemia: His hemoglobin continues to improve at this time without any additional treatment.  4.  Goals of care: Therapy remains palliative and his disease is incurable.  Aggressive measures are warranted given his reasonable performance status.  5.  Bone directed therapy: I recommended continuing calcium and vitamin D supplements at this time.  Xgeva.  Has been deferred he is cleared tentatively.  6.  IV access: Port-A-Cath remains in place and will be flushed every 2 months.  7.  Constipation: We have discussed strategies to improve his bowel habits including a regular bowel regimen utilizing Senokot-S and MiraLAX daily.  We also urged increase of fiber as well as hydration.  8. Follow-up: He will return in 2 months for a repeat follow-up.  25  minutes was spent with the patient face-to-face today.  More than 50% of time was dedicated to reviewing his disease status, treatment options and coordinating future plan of care.     Zola Button, MD 11/20/202010:05 AM

## 2019-08-03 ENCOUNTER — Telehealth: Payer: Self-pay | Admitting: Oncology

## 2019-08-03 ENCOUNTER — Encounter (HOSPITAL_COMMUNITY): Payer: Self-pay

## 2019-08-03 ENCOUNTER — Other Ambulatory Visit: Payer: Self-pay

## 2019-08-03 ENCOUNTER — Telehealth: Payer: Self-pay

## 2019-08-03 ENCOUNTER — Emergency Department (HOSPITAL_COMMUNITY): Payer: Medicare Other

## 2019-08-03 ENCOUNTER — Ambulatory Visit: Payer: Medicare Other | Admitting: Physical Therapy

## 2019-08-03 ENCOUNTER — Inpatient Hospital Stay (HOSPITAL_COMMUNITY)
Admission: EM | Admit: 2019-08-03 | Discharge: 2019-08-06 | DRG: 872 | Disposition: A | Discharge status: Home / Self Care | Payer: Medicare Other | Attending: Internal Medicine | Admitting: Internal Medicine

## 2019-08-03 DIAGNOSIS — D638 Anemia in other chronic diseases classified elsewhere: Secondary | ICD-10-CM | POA: Diagnosis present

## 2019-08-03 DIAGNOSIS — B961 Klebsiella pneumoniae [K. pneumoniae] as the cause of diseases classified elsewhere: Secondary | ICD-10-CM | POA: Diagnosis present

## 2019-08-03 DIAGNOSIS — Z66 Do not resuscitate: Secondary | ICD-10-CM | POA: Diagnosis present

## 2019-08-03 DIAGNOSIS — R000 Tachycardia, unspecified: Secondary | ICD-10-CM | POA: Diagnosis present

## 2019-08-03 DIAGNOSIS — Z79899 Other long term (current) drug therapy: Secondary | ICD-10-CM

## 2019-08-03 DIAGNOSIS — Z20828 Contact with and (suspected) exposure to other viral communicable diseases: Secondary | ICD-10-CM | POA: Diagnosis present

## 2019-08-03 DIAGNOSIS — Z91010 Allergy to peanuts: Secondary | ICD-10-CM

## 2019-08-03 DIAGNOSIS — Z6823 Body mass index (BMI) 23.0-23.9, adult: Secondary | ICD-10-CM | POA: Diagnosis not present

## 2019-08-03 DIAGNOSIS — C7951 Secondary malignant neoplasm of bone: Secondary | ICD-10-CM | POA: Diagnosis present

## 2019-08-03 DIAGNOSIS — Z888 Allergy status to other drugs, medicaments and biological substances status: Secondary | ICD-10-CM | POA: Diagnosis not present

## 2019-08-03 DIAGNOSIS — I10 Essential (primary) hypertension: Secondary | ICD-10-CM | POA: Diagnosis present

## 2019-08-03 DIAGNOSIS — N390 Urinary tract infection, site not specified: Secondary | ICD-10-CM | POA: Diagnosis present

## 2019-08-03 DIAGNOSIS — K6289 Other specified diseases of anus and rectum: Secondary | ICD-10-CM

## 2019-08-03 DIAGNOSIS — R627 Adult failure to thrive: Secondary | ICD-10-CM | POA: Diagnosis present

## 2019-08-03 DIAGNOSIS — K5900 Constipation, unspecified: Secondary | ICD-10-CM

## 2019-08-03 DIAGNOSIS — Z1611 Resistance to penicillins: Secondary | ICD-10-CM | POA: Diagnosis present

## 2019-08-03 DIAGNOSIS — R112 Nausea with vomiting, unspecified: Secondary | ICD-10-CM | POA: Diagnosis not present

## 2019-08-03 DIAGNOSIS — Z993 Dependence on wheelchair: Secondary | ICD-10-CM | POA: Diagnosis not present

## 2019-08-03 DIAGNOSIS — K219 Gastro-esophageal reflux disease without esophagitis: Secondary | ICD-10-CM | POA: Diagnosis present

## 2019-08-03 DIAGNOSIS — K5641 Fecal impaction: Secondary | ICD-10-CM | POA: Diagnosis present

## 2019-08-03 DIAGNOSIS — K5901 Slow transit constipation: Secondary | ICD-10-CM | POA: Diagnosis not present

## 2019-08-03 DIAGNOSIS — K5289 Other specified noninfective gastroenteritis and colitis: Secondary | ICD-10-CM | POA: Diagnosis present

## 2019-08-03 DIAGNOSIS — Z91018 Allergy to other foods: Secondary | ICD-10-CM | POA: Diagnosis not present

## 2019-08-03 DIAGNOSIS — R1110 Vomiting, unspecified: Secondary | ICD-10-CM | POA: Diagnosis not present

## 2019-08-03 DIAGNOSIS — C61 Malignant neoplasm of prostate: Secondary | ICD-10-CM | POA: Diagnosis present

## 2019-08-03 DIAGNOSIS — E785 Hyperlipidemia, unspecified: Secondary | ICD-10-CM | POA: Diagnosis present

## 2019-08-03 DIAGNOSIS — K529 Noninfective gastroenteritis and colitis, unspecified: Secondary | ICD-10-CM | POA: Diagnosis present

## 2019-08-03 DIAGNOSIS — E876 Hypokalemia: Secondary | ICD-10-CM | POA: Diagnosis present

## 2019-08-03 DIAGNOSIS — A419 Sepsis, unspecified organism: Secondary | ICD-10-CM | POA: Diagnosis present

## 2019-08-03 DIAGNOSIS — Z8042 Family history of malignant neoplasm of prostate: Secondary | ICD-10-CM

## 2019-08-03 DIAGNOSIS — R1111 Vomiting without nausea: Secondary | ICD-10-CM | POA: Diagnosis not present

## 2019-08-03 LAB — COMPREHENSIVE METABOLIC PANEL
ALT: 13 U/L (ref 0–44)
AST: 16 U/L (ref 15–41)
Albumin: 3.9 g/dL (ref 3.5–5.0)
Alkaline Phosphatase: 65 U/L (ref 38–126)
Anion gap: 12 (ref 5–15)
BUN: 13 mg/dL (ref 8–23)
CO2: 22 mmol/L (ref 22–32)
Calcium: 9.1 mg/dL (ref 8.9–10.3)
Chloride: 104 mmol/L (ref 98–111)
Creatinine, Ser: 0.96 mg/dL (ref 0.61–1.24)
GFR calc Af Amer: >60 mL/min (ref >60)
GFR calc non Af Amer: >60 mL/min (ref >60)
Glucose, Bld: 148 mg/dL — ABNORMAL HIGH (ref 70–99)
Potassium: 3.7 mmol/L (ref 3.5–5.1)
Sodium: 138 mmol/L (ref 135–145)
Total Bilirubin: 0.5 mg/dL (ref 0.3–1.2)
Total Protein: 7.5 g/dL (ref 6.5–8.1)

## 2019-08-03 LAB — URINALYSIS, ROUTINE W REFLEX MICROSCOPIC
Bilirubin Urine: NEGATIVE
Glucose, UA: NEGATIVE mg/dL
Hgb urine dipstick: NEGATIVE
Ketones, ur: 5 mg/dL — AB
Nitrite: NEGATIVE
Protein, ur: 30 mg/dL — AB
Specific Gravity, Urine: 1.01 (ref 1.005–1.030)
WBC, UA: >50 WBC/hpf — ABNORMAL HIGH (ref 0–5)
pH: 7 (ref 5.0–8.0)

## 2019-08-03 LAB — CBC
HCT: 36.7 % — ABNORMAL LOW (ref 39.0–52.0)
Hemoglobin: 11.7 g/dL — ABNORMAL LOW (ref 13.0–17.0)
MCH: 29.5 pg (ref 26.0–34.0)
MCHC: 31.9 g/dL (ref 30.0–36.0)
MCV: 92.7 fL (ref 80.0–100.0)
Platelets: 306 10*3/uL (ref 150–400)
RBC: 3.96 MIL/uL — ABNORMAL LOW (ref 4.22–5.81)
RDW: 18.4 % — ABNORMAL HIGH (ref 11.5–15.5)
WBC: 5.5 10*3/uL (ref 4.0–10.5)
nRBC: 0 % (ref 0.0–0.2)

## 2019-08-03 LAB — LIPASE, BLOOD: Lipase: 20 U/L (ref 11–51)

## 2019-08-03 LAB — LACTIC ACID, PLASMA
Lactic Acid, Venous: 1.8 mmol/L (ref 0.5–1.9)
Lactic Acid, Venous: 1.2 mmol/L (ref 0.5–1.9)

## 2019-08-03 LAB — APTT: aPTT: 43 seconds — ABNORMAL HIGH (ref 24–36)

## 2019-08-03 LAB — PROTIME-INR
INR: 1.2 (ref 0.8–1.2)
Prothrombin Time: 14.9 seconds (ref 11.4–15.2)

## 2019-08-03 LAB — POC OCCULT BLOOD, ED: Fecal Occult Bld: NEGATIVE

## 2019-08-03 IMAGING — CT CT ABD-PELV W/ CM
3 of 11 series · 12 of 46 positions shown, 17 images · IV contrast (omnipaque)
Comparison: Plain films of earlier in the day. Abdominopelvic CT of
[DATE]. No prior chest CT.

CLINICAL DATA: Constipation for 1 week with nausea and vomiting.
Reportedly vomiting feces. Rule out impaction. History of prostate
cancer.

EXAM:
CT ANGIOGRAPHY CHEST
CT ABDOMEN AND PELVIS WITH CONTRAST
TECHNIQUE: Multidetector CT imaging of the chest was performed using the
standard protocol during bolus administration of intravenous
contrast. Multiplanar CT image reconstructions and MIPs were
obtained to evaluate the vascular anatomy. Multidetector CT imaging
of the abdomen and pelvis was performed using the standard protocol
during bolus administration of intravenous contrast.
CONTRAST:  100mL OMNIPAQUE IOHEXOL 350 MG/ML SOLN

[Series 5: thins · axial · 0.65mm/px · z∈[-301,-85]mm · 7 of 295 slices shown]
[im 20/295  soft-tissue]
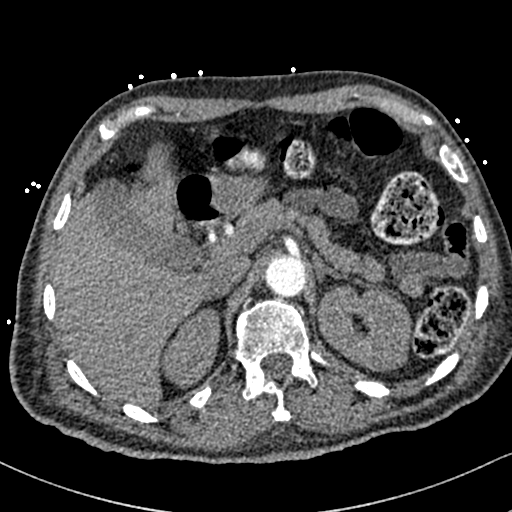
[im 59/295  soft-tissue]
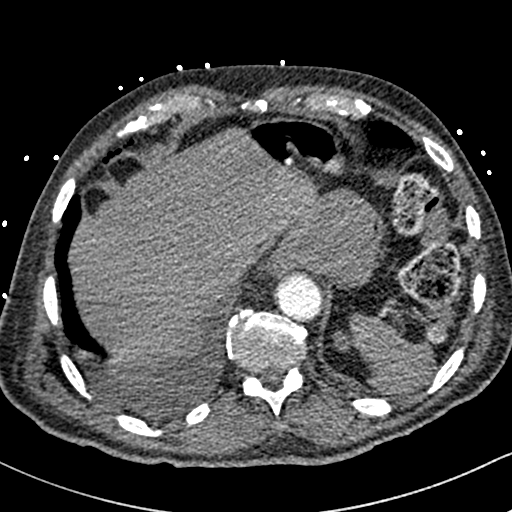
[im 99/295  soft-tissue]
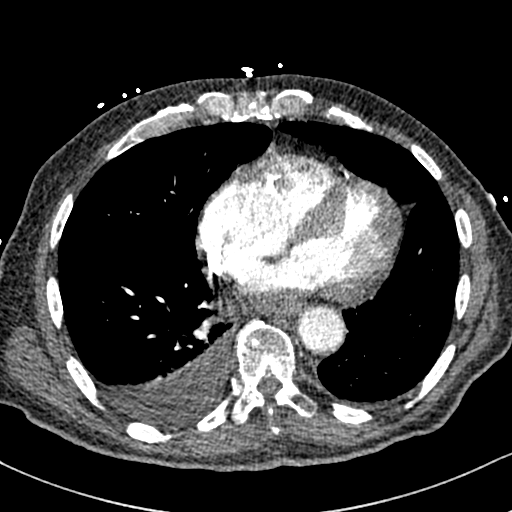
[im 138/295  soft-tissue]
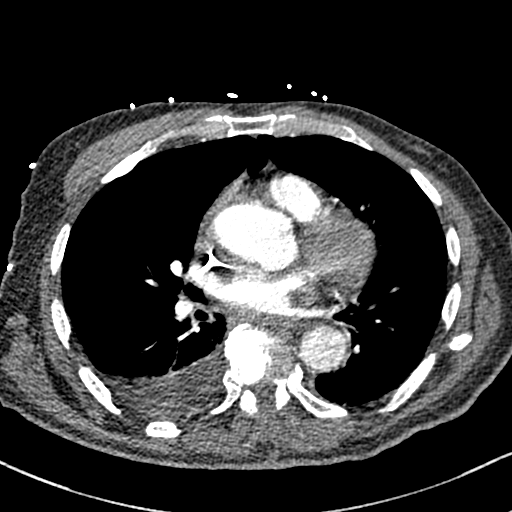
[im 157/295  soft-tissue]
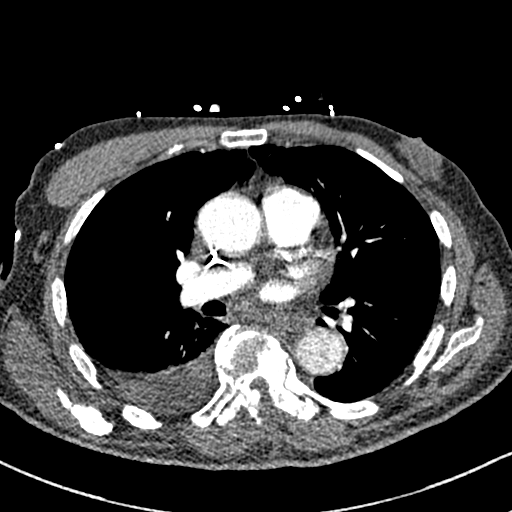
[im 197/295  soft-tissue]
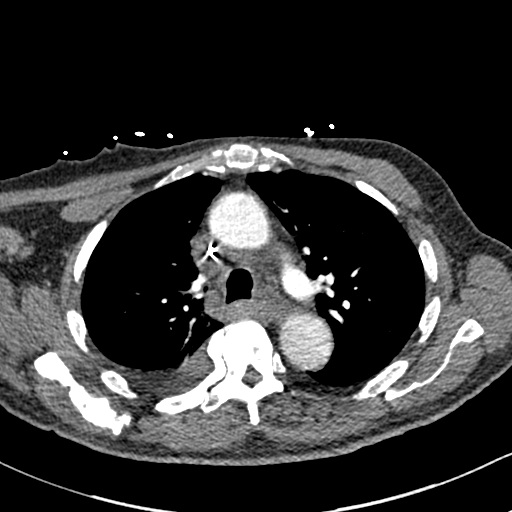
[im 236/295  soft-tissue]
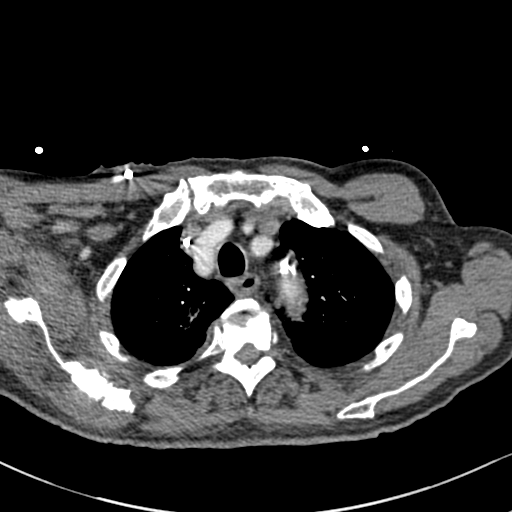

[Series 7: coronal mpr · coronal · 0.59mm/px · 2 of 136 slices shown, 3 images]
[im 46/136  soft-tissue]
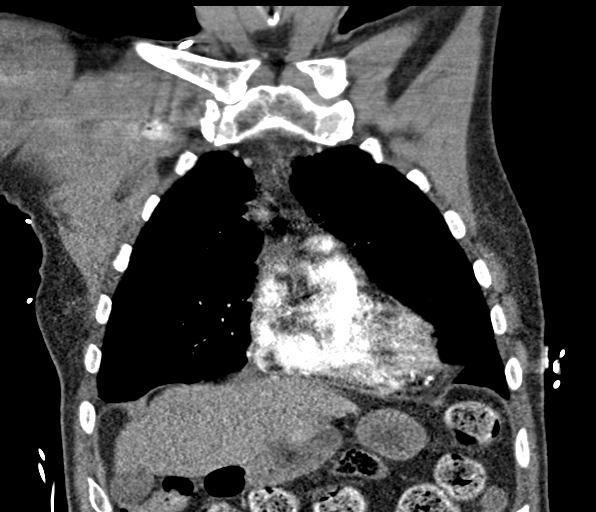
[im 46/136  bone]
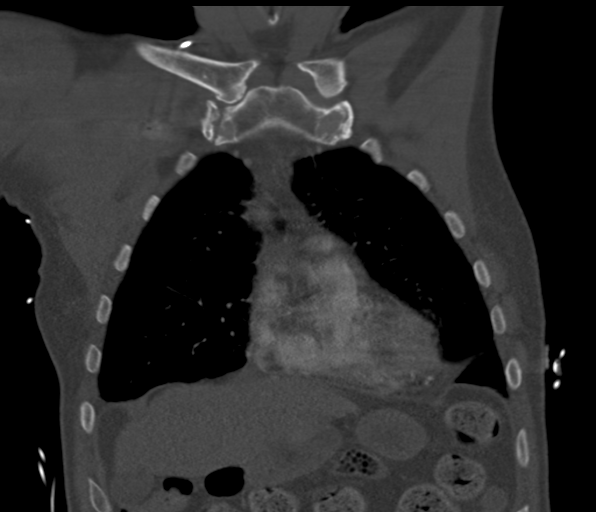
[im 91/136  soft-tissue]
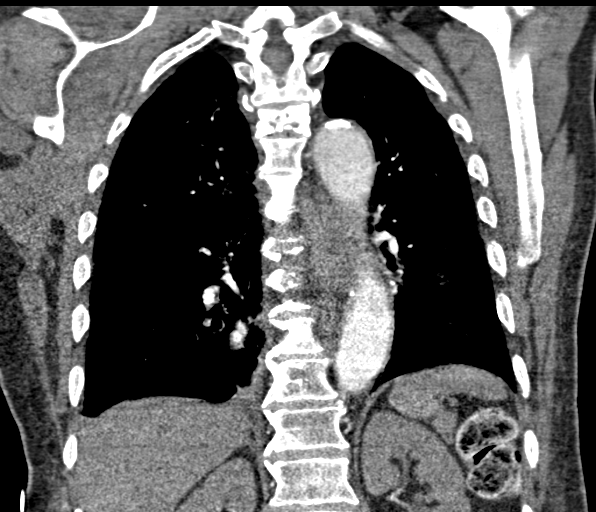

[Series 11: axial st · axial · 0.69mm/px · z∈[-520,-310]mm · 3 of 85 slices shown, 7 images]
[im 22/85  soft-tissue]
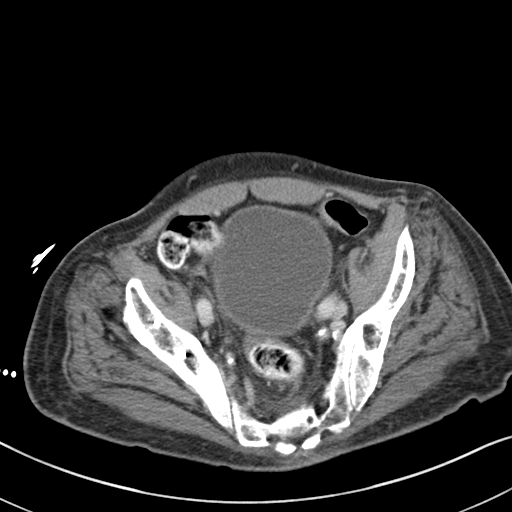
[im 22/85  lung]
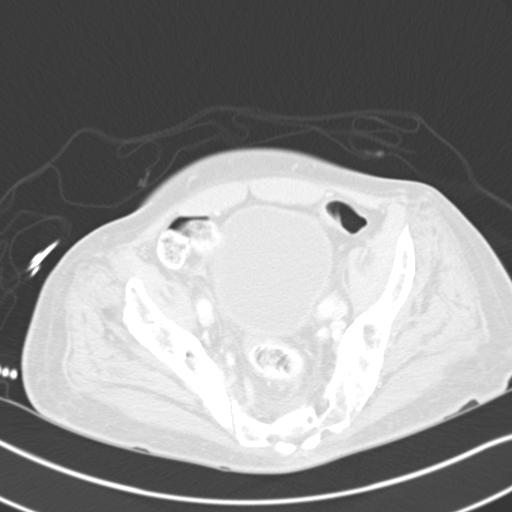
[im 22/85  bone]
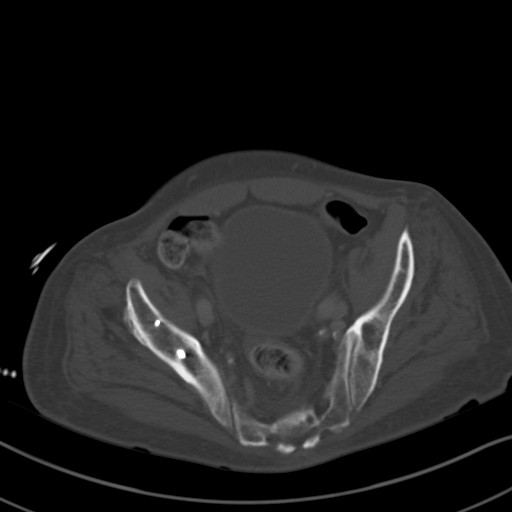
[im 43/85  soft-tissue]
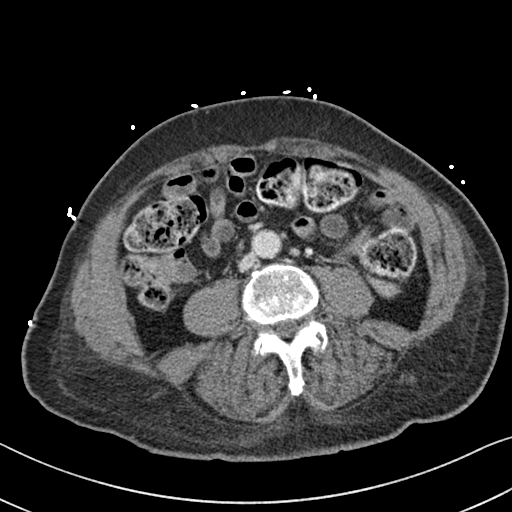
[im 43/85  lung]
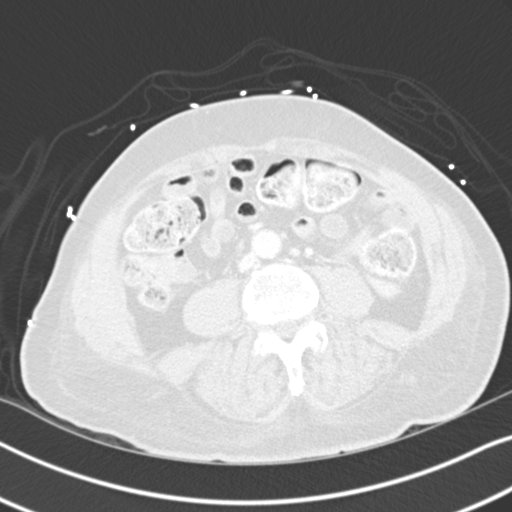
[im 64/85  soft-tissue]
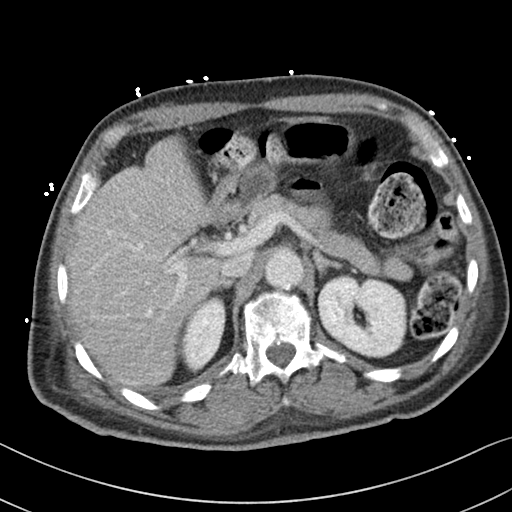
[im 64/85  lung]
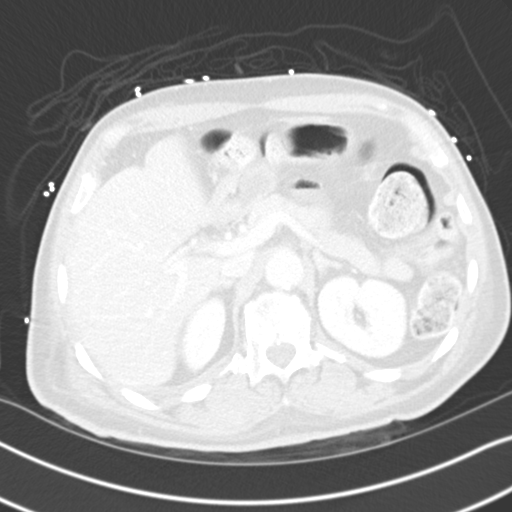

[12 of 46 positions shown; findings below may reference images not displayed]

FINDINGS: CTA CHEST FINDINGS

Cardiovascular: The quality of this exam for evaluation of pulmonary
embolism is moderate. Portions of exam are motion degraded. The
bolus is relatively well timed. No pulmonary embolism to the large
segmental level.

A right Port-A-Cath with tip at mid right atrium. Aortic and branch
vessel atherosclerosis. Tortuous thoracic aorta. Mild cardiomegaly,
with trace pericardial fluid. Multivessel coronary artery
atherosclerosis.

Mediastinum/Nodes: No mediastinal or hilar adenopathy. Tiny hiatal
hernia.

Lungs/Pleura: Small right pleural effusion. Probable scarring in the
right apex.

Right middle lobe 5 mm pulmonary nodule on 82/6.

Left upper lobe 4 mm nodule on 73/6. Neither of these areas were
included on prior abdominal CTs.

Musculoskeletal: Moderate right greater than left gynecomastia.

Widespread sclerotic osseous metastasis, including throughout the
right scapula, vertebral bodies, and bilateral ribs.

Review of the MIP images confirms the above findings.

CT ABDOMEN and PELVIS FINDINGS

Hepatobiliary: Normal liver. Normal gallbladder, without biliary
ductal dilatation.

Pancreas: Normal, without mass or ductal dilatation.

Spleen: Normal in size, without focal abnormality.

Adrenals/Urinary Tract: Normal adrenal glands. Normal kidneys,
without hydronephrosis. Degraded evaluation of the pelvis, secondary
to beam hardening artifact from bilateral hip arthroplasty. Grossly
normal urinary bladder.

Stomach/Bowel: Tiny hiatal hernia.

Small amount of stool in the rectum. There is probable rectal wall
thickening and surrounding edema, including on 77/11 and continuing
to surround the anus on 85/11. Large amount of colonic stool more
proximally. Normal terminal ileum and appendix. Normal small bowel.

Vascular/Lymphatic: Aortic and branch vessel atherosclerosis. No
abdominopelvic adenopathy. Previously described inguinal adenopathy
is no longer identified.

Reproductive: Not well evaluated.

Other: No gross free pelvic fluid. No abdominal ascites. No free
intraperitoneal air.

Musculoskeletal: Bilateral hip arthroplasty. Grade 1-2
anterolisthesis of L4 on L5.

Review of the MIP images confirms the above findings.
IMPRESSION: 1. No evidence of pulmonary embolism. Please see limitations above.
2. Extensive osseous metastasis throughout the chest.
3. Bilateral pulmonary nodules, above the level of scanning on prior
abdominal CTs. Indeterminate. Cannot exclude pulmonary metastasis.
4. No evidence of soft tissue metastasis within the abdomen or
pelvis. Previously described inguinal adenopathy has resolved.
5. Possible constipation. Suspicion of proctitis, as evidenced by
rectal wall thickening and surrounding edema. This is nonspecific,
but given clinical suspicion of fecal impaction, could represent
stercoral colitis.
6. Degraded evaluation of the pelvis, secondary to beam hardening
artifact from bilateral hip arthroplasty.
7. Coronary artery atherosclerosis. Aortic Atherosclerosis
([PM]-[PM]).
8. Bilateral gynecomastia.

## 2019-08-03 IMAGING — CT CT ANGIO CHEST
4 of 11 series · 16 of 46 positions shown · IV contrast (omnipaque)
Comparison: Plain films of earlier in the day. Abdominopelvic CT of
[DATE]. No prior chest CT.

CLINICAL DATA: Constipation for 1 week with nausea and vomiting.
Reportedly vomiting feces. Rule out impaction. History of prostate
cancer.

EXAM:
CT ANGIOGRAPHY CHEST
CT ABDOMEN AND PELVIS WITH CONTRAST
TECHNIQUE: Multidetector CT imaging of the chest was performed using the
standard protocol during bolus administration of intravenous
contrast. Multiplanar CT image reconstructions and MIPs were
obtained to evaluate the vascular anatomy. Multidetector CT imaging
of the abdomen and pelvis was performed using the standard protocol
during bolus administration of intravenous contrast.
CONTRAST:  100mL OMNIPAQUE IOHEXOL 350 MG/ML SOLN

[Series 4: axial st · axial · 0.65mm/px · z∈[-248,-101]mm · 3 of 99 slices shown (1 of 2)]
[im 25/99  lung]
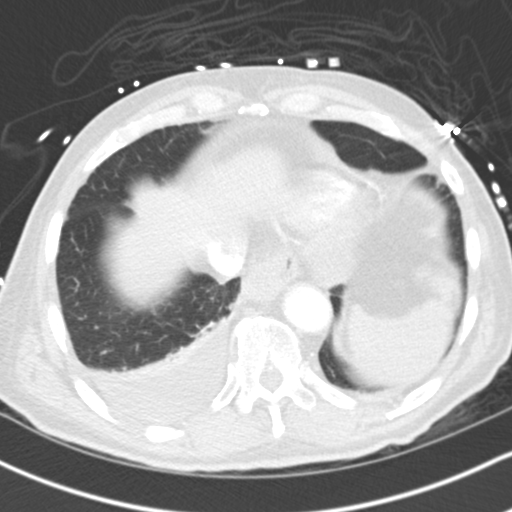
[im 50/99  lung]
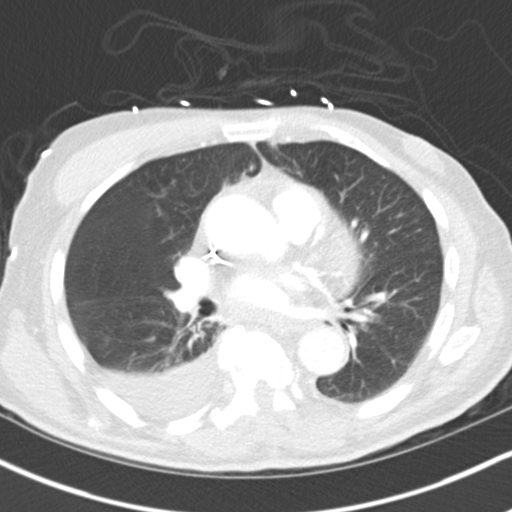
[im 74/99  lung]
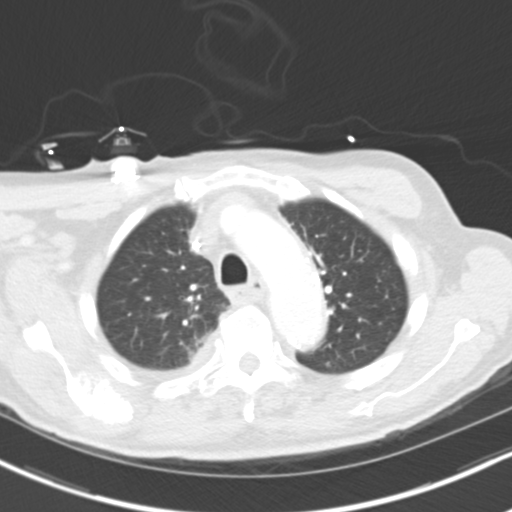

[Series 5: thins · axial · 0.65mm/px · z∈[-301,-46]mm · 8 of 295 slices shown]
[im 20/295  lung]
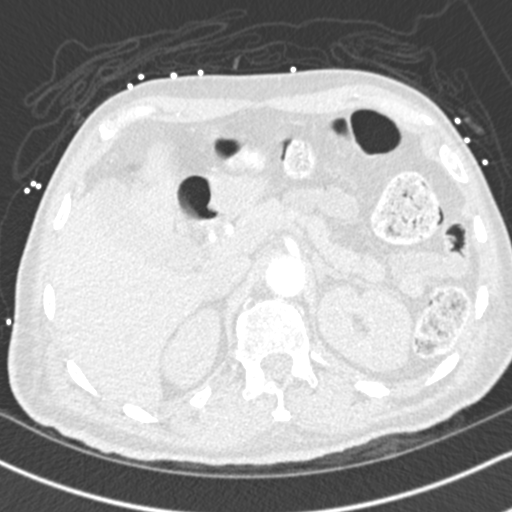
[im 59/295  lung]
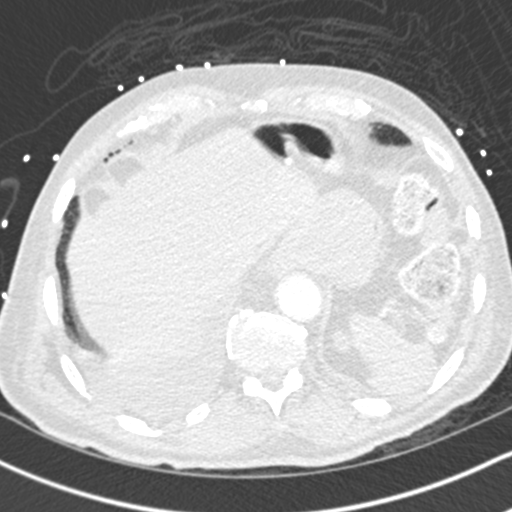
[im 99/295  lung]
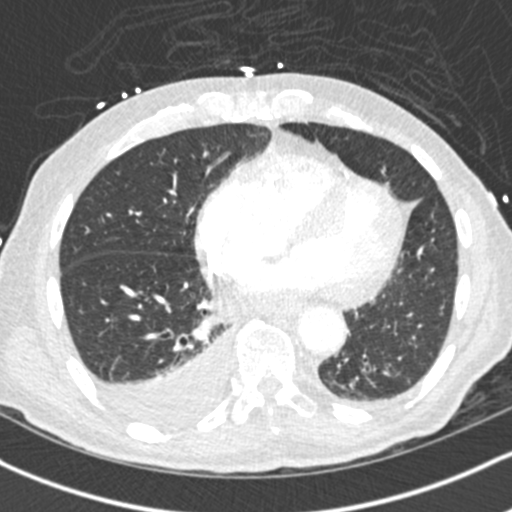
[im 138/295  lung]
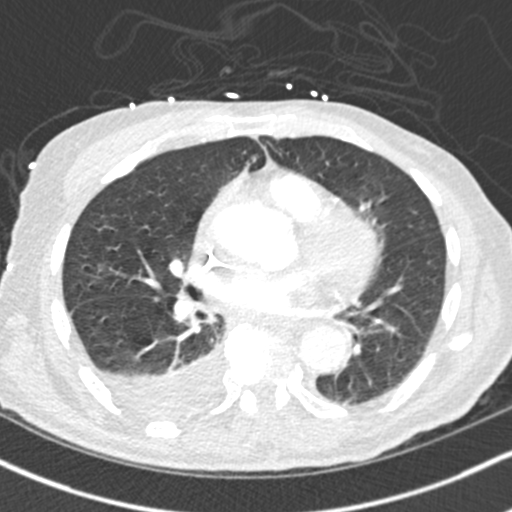
[im 157/295  lung]
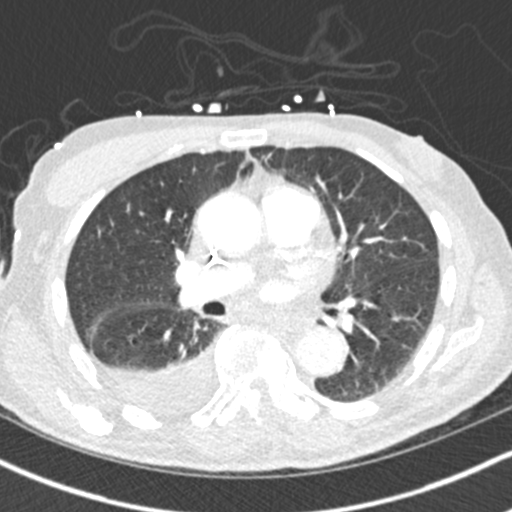
[im 197/295  lung]
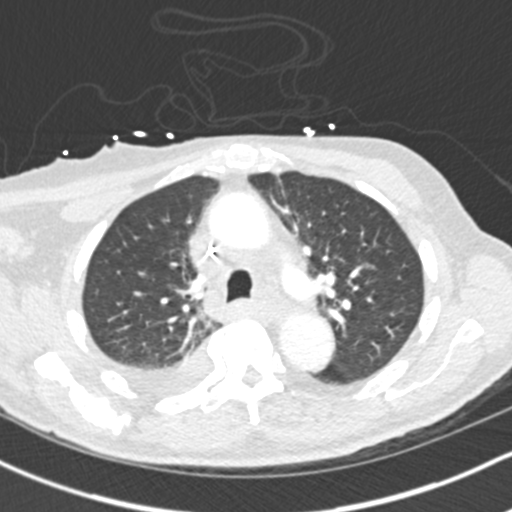
[im 236/295  lung]
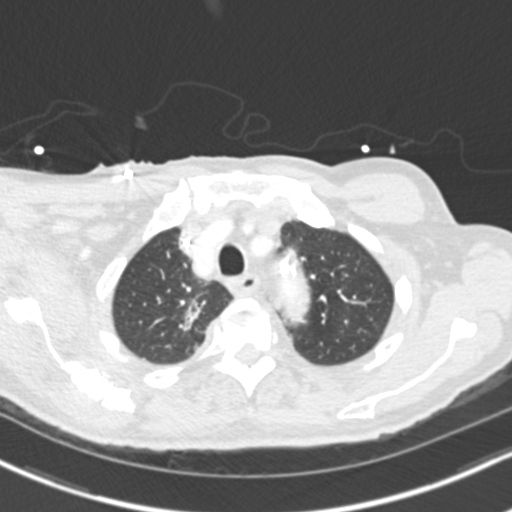
[im 275/295  lung]
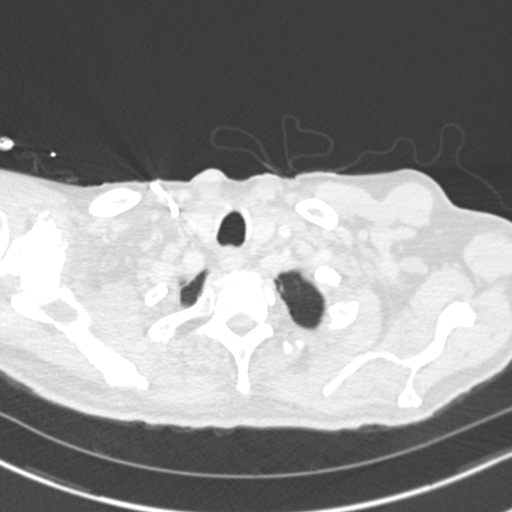

[Series 7: coronal mpr · coronal · 0.59mm/px · 2 of 136 slices shown]
[im 46/136  soft-tissue]
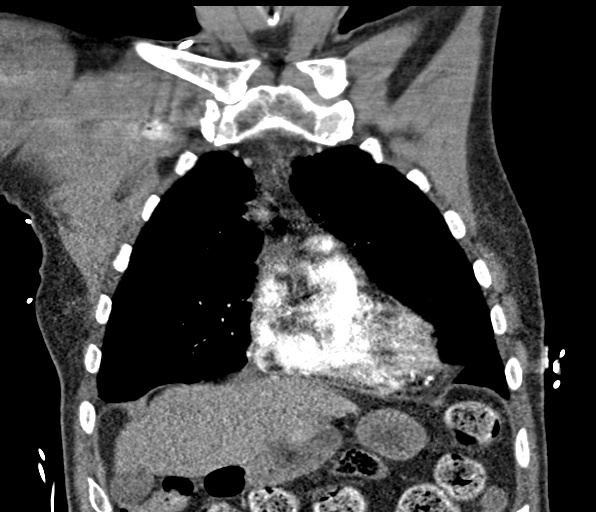
[im 91/136  soft-tissue]
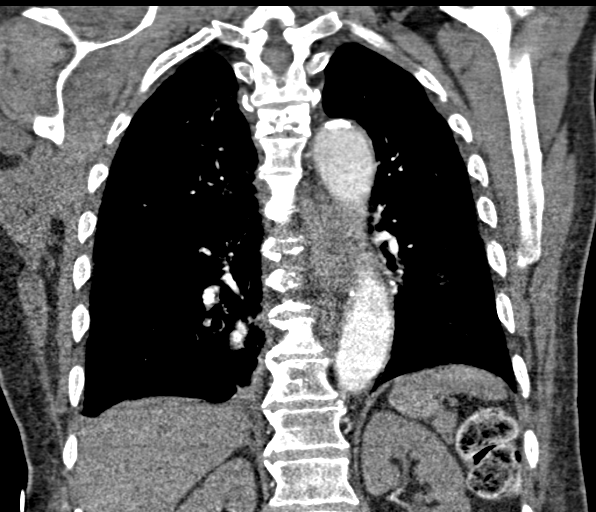

[Series 11: axial st · axial · 0.69mm/px · z∈[-520,-310]mm · 3 of 85 slices shown (2 of 2)]
[im 22/85  lung]
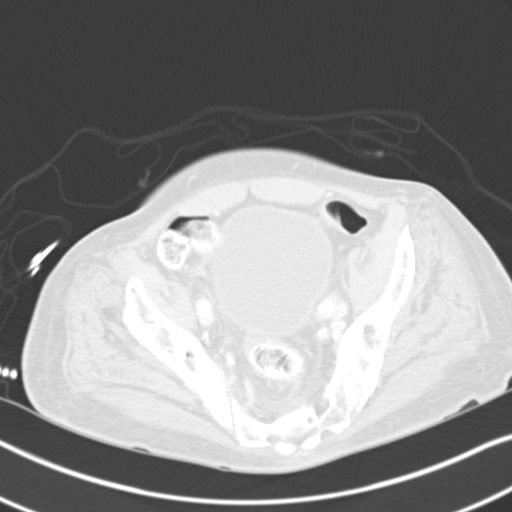
[im 43/85  soft-tissue]
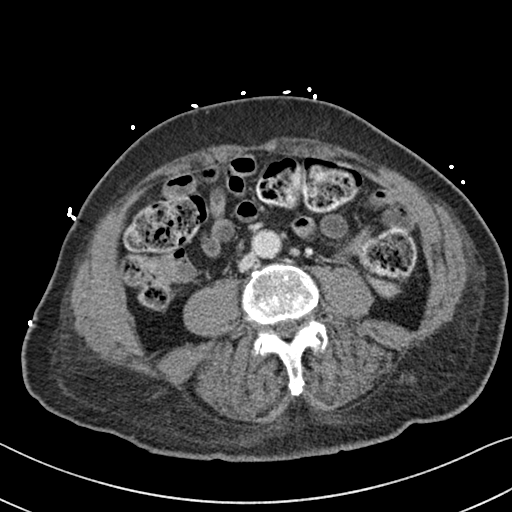
[im 64/85  lung]
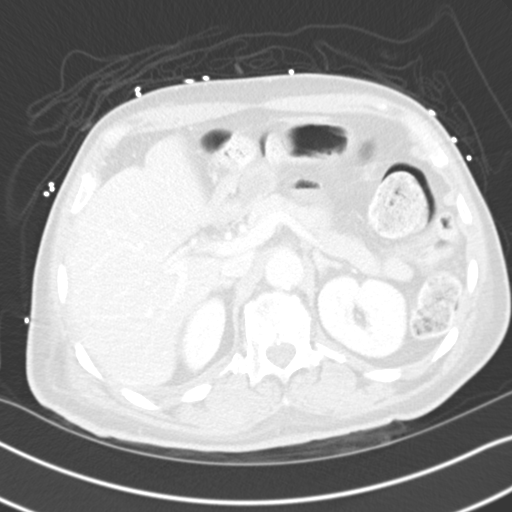

[16 of 46 positions shown; findings below may reference images not displayed]

FINDINGS: CTA CHEST FINDINGS

Cardiovascular: The quality of this exam for evaluation of pulmonary
embolism is moderate. Portions of exam are motion degraded. The
bolus is relatively well timed. No pulmonary embolism to the large
segmental level.

A right Port-A-Cath with tip at mid right atrium. Aortic and branch
vessel atherosclerosis. Tortuous thoracic aorta. Mild cardiomegaly,
with trace pericardial fluid. Multivessel coronary artery
atherosclerosis.

Mediastinum/Nodes: No mediastinal or hilar adenopathy. Tiny hiatal
hernia.

Lungs/Pleura: Small right pleural effusion. Probable scarring in the
right apex.

Right middle lobe 5 mm pulmonary nodule on 82/6.

Left upper lobe 4 mm nodule on 73/6. Neither of these areas were
included on prior abdominal CTs.

Musculoskeletal: Moderate right greater than left gynecomastia.

Widespread sclerotic osseous metastasis, including throughout the
right scapula, vertebral bodies, and bilateral ribs.

Review of the MIP images confirms the above findings.

CT ABDOMEN and PELVIS FINDINGS

Hepatobiliary: Normal liver. Normal gallbladder, without biliary
ductal dilatation.

Pancreas: Normal, without mass or ductal dilatation.

Spleen: Normal in size, without focal abnormality.

Adrenals/Urinary Tract: Normal adrenal glands. Normal kidneys,
without hydronephrosis. Degraded evaluation of the pelvis, secondary
to beam hardening artifact from bilateral hip arthroplasty. Grossly
normal urinary bladder.

Stomach/Bowel: Tiny hiatal hernia.

Small amount of stool in the rectum. There is probable rectal wall
thickening and surrounding edema, including on 77/11 and continuing
to surround the anus on 85/11. Large amount of colonic stool more
proximally. Normal terminal ileum and appendix. Normal small bowel.

Vascular/Lymphatic: Aortic and branch vessel atherosclerosis. No
abdominopelvic adenopathy. Previously described inguinal adenopathy
is no longer identified.

Reproductive: Not well evaluated.

Other: No gross free pelvic fluid. No abdominal ascites. No free
intraperitoneal air.

Musculoskeletal: Bilateral hip arthroplasty. Grade 1-2
anterolisthesis of L4 on L5.

Review of the MIP images confirms the above findings.
IMPRESSION: 1. No evidence of pulmonary embolism. Please see limitations above.
2. Extensive osseous metastasis throughout the chest.
3. Bilateral pulmonary nodules, above the level of scanning on prior
abdominal CTs. Indeterminate. Cannot exclude pulmonary metastasis.
4. No evidence of soft tissue metastasis within the abdomen or
pelvis. Previously described inguinal adenopathy has resolved.
5. Possible constipation. Suspicion of proctitis, as evidenced by
rectal wall thickening and surrounding edema. This is nonspecific,
but given clinical suspicion of fecal impaction, could represent
stercoral colitis.
6. Degraded evaluation of the pelvis, secondary to beam hardening
artifact from bilateral hip arthroplasty.
7. Coronary artery atherosclerosis. Aortic Atherosclerosis
([PM]-[PM]).
8. Bilateral gynecomastia.

## 2019-08-03 IMAGING — CR DG ABDOMEN ACUTE W/ 1V CHEST
3 series · 3 of 3 positions shown · non-contrast
Comparison: Radiograph dated [DATE]

CLINICAL DATA: 72-year-old male with concern for obstruction.
History of prostate cancer with osseous metastasis.

EXAM:
DG ABDOMEN ACUTE W/ 1V CHEST

[x chest ap]
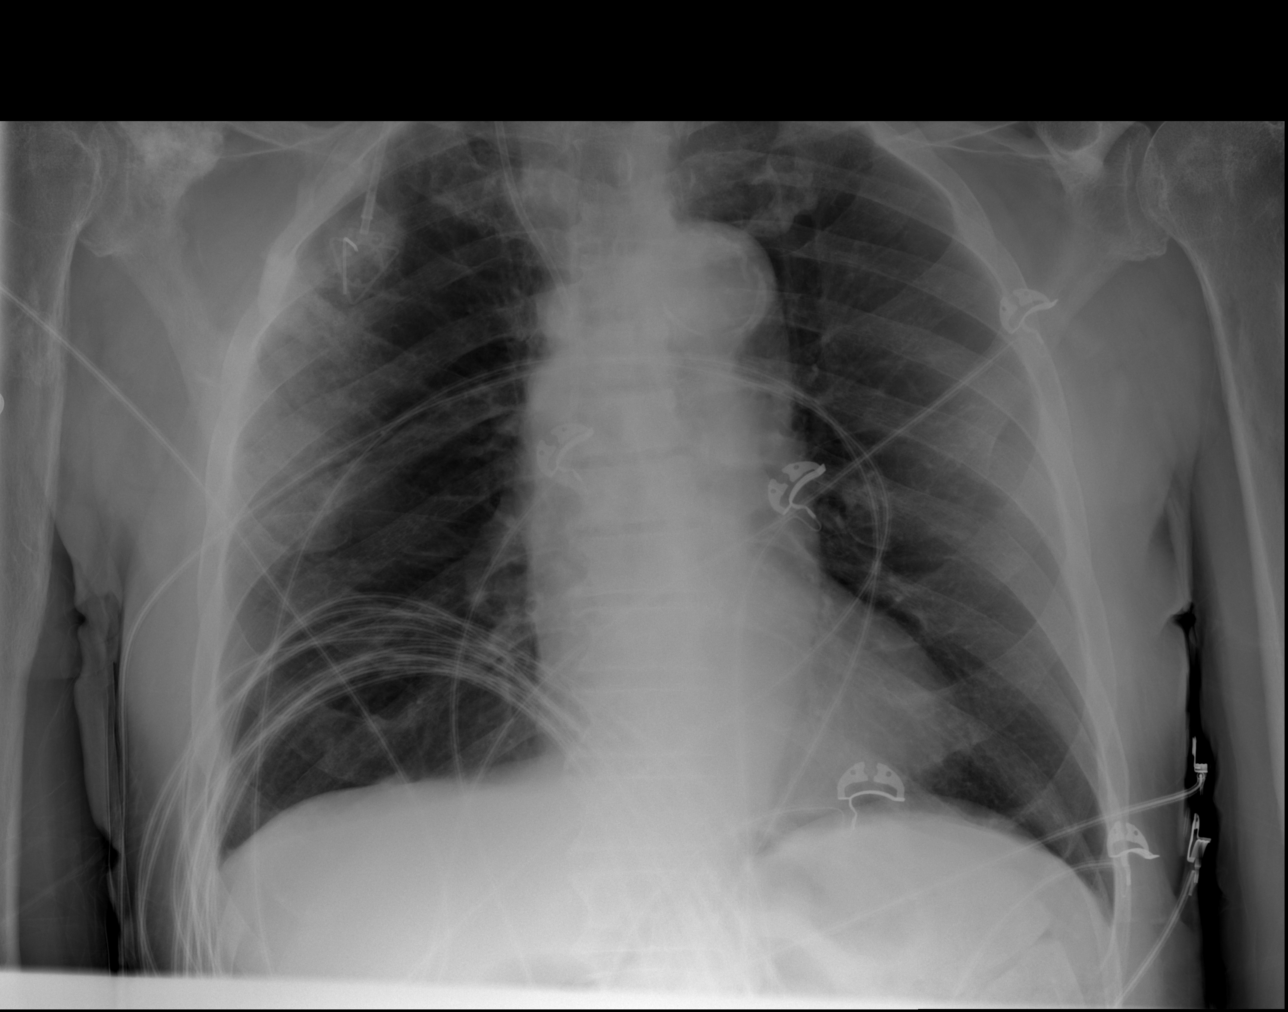

[x abdomen supine]
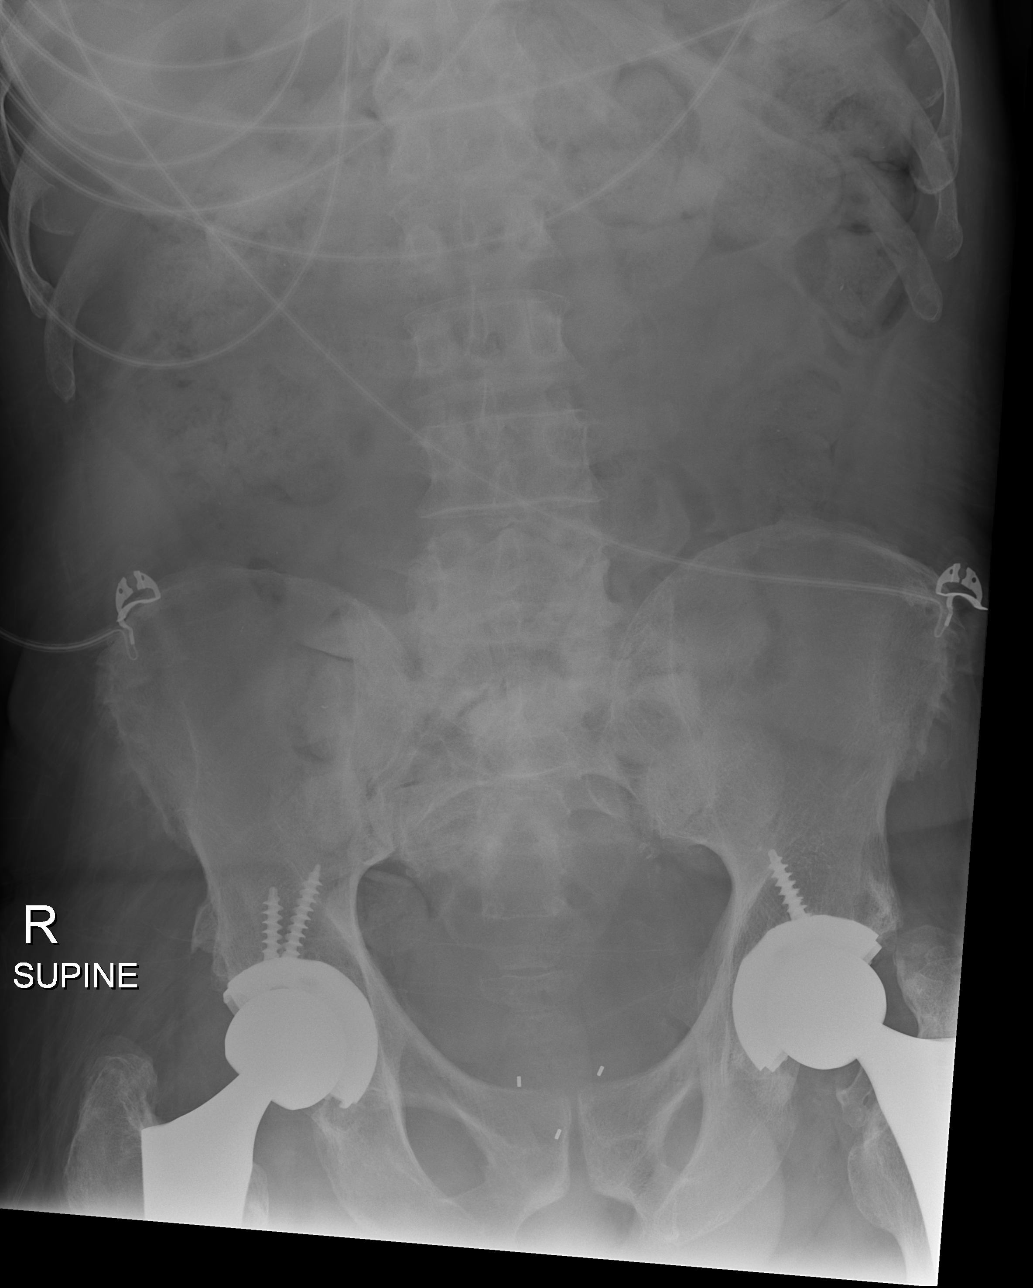

[x abdomen erect]
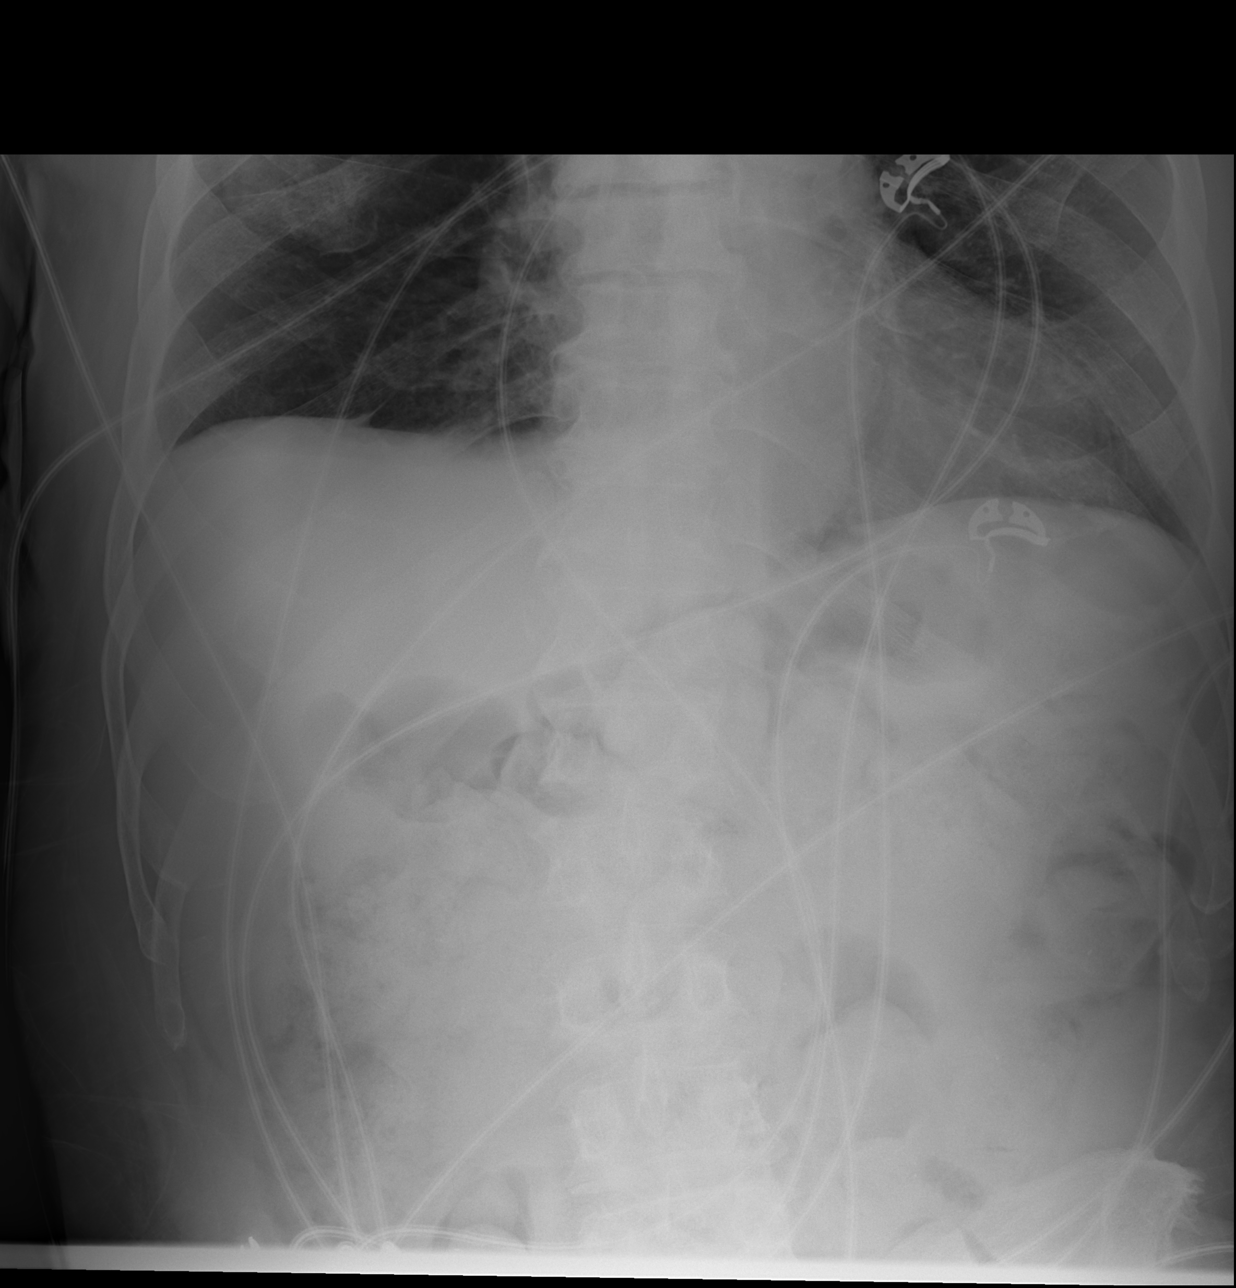

[3 of 3 positions shown; findings below may reference images not displayed]

FINDINGS: The lungs are clear. There is no pleural effusion or pneumothorax.
The cardiac silhouette is within normal limits. Atherosclerotic
calcification of the aorta. Right pectoral Port-A-Cath in similar
position.

Large amount of stool noted throughout the colon. No bowel
dilatation or evidence of obstruction. No free air or radiopaque
calculi. Bilateral hip arthroplasties. Prostate fiducial markers.
The soft tissues are unremarkable. Sclerotic changes of the right
scapula similar to prior radiograph in keeping with metastatic
disease.
IMPRESSION: 1. No acute cardiopulmonary process.
2. Constipation.  No evidence of bowel obstruction.

## 2019-08-03 MED ORDER — ACETAMINOPHEN 325 MG PO TABS
650.0000 mg | ORAL_TABLET | Freq: Once | ORAL | Status: AC
  Administered 2019-08-03: 650 mg via ORAL
  Filled 2019-08-03: qty 2

## 2019-08-03 MED ORDER — IOHEXOL 350 MG/ML SOLN
100.0000 mL | Freq: Once | INTRAVENOUS | Status: AC | PRN
  Administered 2019-08-03: 100 mL via INTRAVENOUS

## 2019-08-03 MED ORDER — SODIUM CHLORIDE 0.9 % IV SOLN
Freq: Once | INTRAVENOUS | Status: AC
  Administered 2019-08-03: 20:00:00 via INTRAVENOUS

## 2019-08-03 MED ORDER — VANCOMYCIN HCL IN DEXTROSE 1-5 GM/200ML-% IV SOLN: 1000.0000 mg | Freq: Two times a day (BID) | INTRAVENOUS | Status: DC

## 2019-08-03 MED ORDER — SODIUM CHLORIDE 0.9 % IV SOLN
2.0000 g | Freq: Once | INTRAVENOUS | Status: AC
  Administered 2019-08-03: 2 g via INTRAVENOUS
  Filled 2019-08-03: qty 2

## 2019-08-03 MED ORDER — SODIUM CHLORIDE (PF) 0.9 % IJ SOLN
INTRAMUSCULAR | Status: AC
  Filled 2019-08-03: qty 50

## 2019-08-03 MED ORDER — VANCOMYCIN HCL IN DEXTROSE 1-5 GM/200ML-% IV SOLN
1000.0000 mg | Freq: Once | INTRAVENOUS | Status: AC
  Administered 2019-08-03: 1000 mg via INTRAVENOUS
  Filled 2019-08-03: qty 200

## 2019-08-03 MED ORDER — SODIUM CHLORIDE 0.9% FLUSH: 3.0000 mL | Freq: Once | INTRAVENOUS | Status: DC

## 2019-08-03 MED ORDER — METRONIDAZOLE IN NACL 5-0.79 MG/ML-% IV SOLN
500.0000 mg | Freq: Once | INTRAVENOUS | Status: AC
  Administered 2019-08-03: 500 mg via INTRAVENOUS
  Filled 2019-08-03: qty 100

## 2019-08-03 MED ORDER — SODIUM CHLORIDE 0.9 % IV BOLUS
500.0000 mL | Freq: Once | INTRAVENOUS | Status: AC
  Administered 2019-08-03: 500 mL via INTRAVENOUS

## 2019-08-03 MED ORDER — LACTATED RINGERS IV BOLUS (SEPSIS): 2000.0000 mL | Freq: Once | INTRAVENOUS | Status: DC

## 2019-08-03 MED ORDER — SODIUM CHLORIDE 0.9 % IV SOLN
2.0000 g | Freq: Three times a day (TID) | INTRAVENOUS | Status: DC
  Administered 2019-08-04: 2 g via INTRAVENOUS
  Filled 2019-08-03 (×3): qty 2

## 2019-08-03 NOTE — ED Notes (Signed)
Phlebotomy notified of need for blood cultures.

## 2019-08-03 NOTE — Telephone Encounter (Signed)
Scheduled appt per 11/20 los.  Spoke with pt and he is aware of the appt date and time.

## 2019-08-03 NOTE — ED Notes (Signed)
Pt transported to CT ?

## 2019-08-03 NOTE — Telephone Encounter (Signed)
Received a call from the patient spouse  Stating that the patient fell out of bed Sunday am. He then had a very dark colored BM and started vomiting stool. She stated that the last time he vomited stool was 7:30 this morning. Advised her to call 911 because he needs to go to the ED for treatment and care. [name] verbalized understanding and had no other questions or concerns.

## 2019-08-03 NOTE — ED Triage Notes (Signed)
Per EMS- Patient reports constipation x 1 week with c/o N/V. Patient states he has been taking stool softeners x 2 days and has had a few hard stools. Patient states he was sent bu his PCP to r/o impaction.

## 2019-08-03 NOTE — Progress Notes (Signed)
A consult was received from an ED physician for Vancomycin and Cefepime per pharmacy dosing (for an indication other than meningitis). The patient's profile has been reviewed for ht/wt/allergies/indication/available labs. A one time order has been placed for the above antibiotics.  Further antibiotics/pharmacy consults should be ordered by admitting physician if indicated.                       Reuel Boom, PharmD, BCPS [phone] 08/03/2019, 7:00 PM

## 2019-08-03 NOTE — Progress Notes (Signed)
Pharmacy Antibiotic Note  Dennis Fischer is a 73 y.o. male admitted on 08/03/2019 with sepsis.  Pharmacy has been consulted for cefepime and vancomycin dosing.  Plan: Cefepime 2 Gm IV q8h Vancomycin 1 Gm IV q12h for est AUC = 531 Use scr = 0.96 Goal AUC = 400-550 F/u scr/cultures/levels     Temp (24hrs), Avg:99.1 F (37.3 C), Min:98.4 F (36.9 C), Max:100.7 F (38.2 C)  Recent Labs  Lab 08/03/19 1215 08/03/19 1619 08/03/19 1813  WBC 5.5  --   --   CREATININE 0.96  --   --   LATICACIDVEN  --  1.8 1.2    Estimated Creatinine Clearance: 75.9 mL/min (by C-G formula based on SCr of 0.96 mg/dL).    Allergies  Allergen Reactions  . Lisinopril Swelling    Facial swelling  . Cat Hair Extract     eyes swell  . Mangifera Indica     MANGO --  . Mango Flavor     throat swells with mango  . Other   . Peanut-Containing Drug Products   . Pistachio Nut (Diagnostic)     hands and feet burn/itch  . Sunflower Oil     Sunflower seed allergy    Antimicrobials this admission: 11/23 cefepime >>  11/23 flagyl >>  11/23 vancomycin >>  Dose adjustments this admission:   Microbiology results:  BCx:   UCx:    Sputum:    MRSA PCR:   Thank you for allowing pharmacy to be a part of this patient's care.  Dorrene German 08/03/2019 9:45 PM

## 2019-08-03 NOTE — ED Notes (Signed)
Tried to obtain blood cultures but was unsuccessful

## 2019-08-03 NOTE — H&P (Signed)
History and Physical   Dennis Fischer [id number] DOB: 1946/04/13 DOA: 08/03/2019  Referring MD/NP/PA: Dr. Wyvonnia Dusky  PCP: [name], MD   Outpatient Specialists: Dr. Alen Blew  Patient coming from: Home  Chief Complaint: Vomiting feces  HPI: Dennis Fischer is a 73 y.o. male with medical history significant of prostate cancer with multiple metastasis to the bone and other parts of the body, GERD, hypertension, recurrent constipation, previous GI bleed who presented with bilious vomiting noted to include feces.  Patient has taken Senokot and MiraLAX for constipation given from his oncologist 3 days ago.  His continues to have significant abdominal distention.  Patient has had 3 episode of emesis prior to coming to the hospital.  In the ER he was seen and evaluated and appears to have evidence of sepsis by criteria and possible UTI.  He is being admitted to the hospital for treatment..  ED Course: Temperature 100.7 blood pressure 139/92 pulse 138 respiratory of 25 and oxygen sat 93% on room air.  White count 5.5 hemoglobin 10.7 platelets 306.  Urinalysis showed cloudy urine with large leukocytes.  WBC more than 50 and rare bacteria COVID-19 is currently pending.  Chemistries relatively normal.  Lactic acid 1.8.  CT chest without contrast and CT abdomen and pelvis showed stercoral colitis with proctitis.  He will be admitted for further work-up  Review of Systems: As per HPI otherwise 10 point review of systems negative.    Past Medical History:  Diagnosis Date  . Acute blood loss anemia   . Hypertension   . Metastatic cancer to bone ([place]) dx'd 2018  . Prostate cancer [facility])     Past Surgical History:  Procedure Laterality Date  . HIP SURGERY Bilateral   . IR IMAGING GUIDED PORT INSERTION  04/22/2018  . LAMINECTOMY N/A 03/09/2019   Procedure: THORACIC SEVEN - THORACIC EIGHT - THORACIC NINE LAMINECTOMY WITH RESECTION OF EPIDURAL TUMOR;  Surgeon: Earnie Larsson, MD;  Location: [facility];   Service: Neurosurgery;  Laterality: N/A;     reports that he quit smoking about 4 years ago. His smoking use included cigarettes. He has a 12.50 pack-year smoking history. He has never used smokeless tobacco. He reports current alcohol use. He reports current drug use. Drug: Marijuana.  Allergies  Allergen Reactions  . Lisinopril Swelling    Facial swelling  . Cat Hair Extract     eyes swell  . Mangifera Indica     MANGO --  . Mango Flavor     throat swells with mango  . Other   . Peanut-Containing Drug Products   . Pistachio Nut (Diagnostic)     hands and feet burn/itch  . Sunflower Oil     Sunflower seed allergy    Family History  Problem Relation Age of Onset  . Prostate cancer Father      Prior to Admission medications   Medication Sig Start Date End Date Taking? Authorizing Provider  acetaminophen (TYLENOL) 325 MG tablet Take 1-2 tablets (325-650 mg total) by mouth every 4 (four) hours as needed for mild pain. 03/27/19  Yes Love, Ivan Anchors, PA-C  amLODipine (NORVASC) 10 MG tablet TAKE 1 TABLET(10 MG) BY MOUTH DAILY Patient taking differently: Take 10 mg by mouth daily.  02/23/19  Yes [name], MD  CALCIUM 500/D 500-200 MG-UNIT tablet TAKE 1 TABLET BY MOUTH TWICE DAILY Patient taking differently: Take 1 tablet by mouth daily with breakfast.  05/21/19  Yes Shadad, Mathis Dad, MD  carvedilol (COREG) 12.5 MG tablet  TAKE 1 TABLET BY MOUTH TWICE DAILY WITH A MEAL Patient taking differently: Take 12.5 mg by mouth 2 (two) times daily with a meal.  05/26/19  Yes [name], MD  cyclobenzaprine (FLEXERIL) 10 MG tablet TAKE 1 TABLET BY MOUTH THREE TIMES DAILY AS NEEDED FOR MUSCLE SPASMS Patient taking differently: Take 10 mg by mouth 3 (three) times daily as needed for muscle spasms.  07/17/19  Yes [name], MD  gabapentin (NEURONTIN) 300 MG capsule Take 1 capsule (300 mg total) by mouth at bedtime. Patient taking differently: Take 300 mg by mouth daily.  01/27/19  Yes Tanner,  Lyndon Code., PA-C  Multiple Vitamin (MULTIVITAMIN WITH MINERALS) TABS tablet Take 1 tablet by mouth daily.   Yes [provider]  pantoprazole (PROTONIX) 40 MG tablet Take 1 tablet (40 mg total) by mouth daily. 03/12/19  Yes Anderson, Chelsey L, DO  polyethylene glycol (MIRALAX / GLYCOLAX) 17 g packet Take 17 g by mouth daily as needed for mild constipation. 07/31/19  Yes Wyatt Portela, MD  prochlorperazine (COMPAZINE) 10 MG tablet TAKE 1 TABLET BY MOUTH EVERY 6 HOURS AS NEEDED FOR NAUSEA OR VOMITING Patient taking differently: Take 10 mg by mouth every 6 (six) hours as needed for nausea or vomiting.  06/11/18  Yes Wyatt Portela, MD  simvastatin (ZOCOR) 20 MG tablet Take 1 tablet (20 mg total) by mouth daily. 05/26/19  Yes [name], MD  dexamethasone (DECADRON) 2 MG tablet Take 1 tablet (2 mg total) by mouth every 12 (twelve) hours. Patient not taking: Reported on 08/03/2019 03/27/19   Love, Ivan Anchors, PA-C  HYDROcodone-acetaminophen (NORCO) 5-325 MG tablet Take 1 tablet by mouth every 6 (six) hours as needed for moderate pain. Patient not taking: Reported on 06/05/2019 04/28/19   Wyatt Portela, MD  lidocaine-prilocaine (EMLA) cream Apply 1 application topically as needed. Patient not taking: Reported on 08/03/2019 04/15/18   Wyatt Portela, MD  megestrol (MEGACE) 400 MG/10ML suspension Take 10 mLs (400 mg total) by mouth 2 (two) times daily. Patient not taking: Reported on 06/05/2019 04/27/19   Wyatt Portela, MD  ondansetron (ZOFRAN) 4 MG tablet Take 1 tablet (4 mg total) by mouth every 6 (six) hours as needed for nausea or vomiting. Patient not taking: Reported on 08/03/2019 03/11/19   Doristine Mango L, DO  senna-docusate (SENOKOT-S) 8.6-50 MG tablet Take 2 tablets by mouth 2 (two) times daily. Patient not taking: Reported on 08/03/2019 07/31/19   Wyatt Portela, MD    Physical Exam: Vitals:   08/03/19 2002 08/03/19 2030 08/03/19 2056 08/03/19 2100  BP: 131/85 119/79  125/89   Pulse: (!) 117 (!) 111  (!) 108  Resp: (!) 21 18  18   Temp:   98.7 F (37.1 C)   TempSrc:   Oral   SpO2: 97% 100%  99%      Constitutional: NAD, calm, comfortable Vitals:   08/03/19 2002 08/03/19 2030 08/03/19 2056 08/03/19 2100  BP: 131/85 119/79  125/89  Pulse: (!) 117 (!) 111  (!) 108  Resp: (!) 21 18  18   Temp:   98.7 F (37.1 C)   TempSrc:   Oral   SpO2: 97% 100%  99%   Eyes: PERRL, lids and conjunctivae normal ENMT: Mucous membranes are moist. Posterior pharynx clear of any exudate or lesions.Normal dentition.  Neck: normal, supple, no masses, no thyromegaly Respiratory: clear to auscultation bilaterally, no wheezing, no crackles. Normal respiratory effort. No accessory muscle use.  Cardiovascular: Regular rate and rhythm, no murmurs / rubs / gallops. No extremity edema. 2+ pedal pulses. No carotid bruits.  Abdomen: Abdominal distention with mild tenderness, no masses palpated. No hepatosplenomegaly. Bowel sounds positive.  Musculoskeletal: no clubbing / cyanosis. No joint deformity upper and lower extremities. Good ROM, no contractures. Normal muscle tone.  Skin: no rashes, lesions, ulcers. No induration Neurologic: CN 2-12 grossly intact. Sensation intact, DTR normal. Strength 5/5 in all 4.  Psychiatric: Normal judgment and insight. Alert and oriented x 3. Normal mood.     Labs on Admission: I have personally reviewed following labs and imaging studies  CBC: Recent Labs  Lab 08/03/19 1215  WBC 5.5  HGB 11.7*  HCT 36.7*  MCV 92.7  PLT [id number]   Basic Metabolic Panel: Recent Labs  Lab 08/03/19 1215  NA 138  K 3.7  CL 104  CO2 22  GLUCOSE 148*  BUN 13  CREATININE 0.96  CALCIUM 9.1   GFR: Estimated Creatinine Clearance: 75.9 mL/min (by C-G formula based on SCr of 0.96 mg/dL). Liver Function Tests: Recent Labs  Lab 08/03/19 1215  AST 16  ALT 13  ALKPHOS 65  BILITOT 0.5  PROT 7.5  ALBUMIN 3.9   Recent Labs  Lab 08/03/19 1215  LIPASE 20   No  results for input(s): AMMONIA in the last 168 hours. Coagulation Profile: Recent Labs  Lab 08/03/19 1619  INR 1.2   Cardiac Enzymes: No results for input(s): CKTOTAL, CKMB, CKMBINDEX, TROPONINI in the last 168 hours. BNP (last 3 results) No results for input(s): PROBNP in the last 8760 hours. HbA1C: No results for input(s): HGBA1C in the last 72 hours. CBG: No results for input(s): GLUCAP in the last 168 hours. Lipid Profile: No results for input(s): CHOL, HDL, LDLCALC, TRIG, CHOLHDL, LDLDIRECT in the last 72 hours. Thyroid Function Tests: No results for input(s): TSH, T4TOTAL, FREET4, T3FREE, THYROIDAB in the last 72 hours. Anemia Panel: No results for input(s): VITAMINB12, FOLATE, FERRITIN, TIBC, IRON, RETICCTPCT in the last 72 hours. Urine analysis:    Component Value Date/Time   COLORURINE YELLOW 08/03/2019 1723   APPEARANCEUR CLOUDY (A) 08/03/2019 1723   LABSPEC 1.010 08/03/2019 1723   PHURINE 7.0 08/03/2019 1723   GLUCOSEU NEGATIVE 08/03/2019 1723   HGBUR NEGATIVE 08/03/2019 1723   BILIRUBINUR NEGATIVE 08/03/2019 1723   KETONESUR 5 (A) 08/03/2019 1723   PROTEINUR 30 (A) 08/03/2019 1723   NITRITE NEGATIVE 08/03/2019 1723   LEUKOCYTESUR LARGE (A) 08/03/2019 1723   Sepsis Labs: @LABRCNTIP (procalcitonin:4,lacticidven:4) )No results found for this or any previous visit (from the past 240 hour(s)).   Radiological Exams on Admission: Ct Angio Chest Pe W/cm &/or Wo Cm  Result Date: 08/03/2019 CLINICAL DATA:  Constipation for 1 week with nausea and vomiting. Reportedly vomiting feces. Rule out impaction. History of prostate cancer. EXAM: CT ANGIOGRAPHY CHEST CT ABDOMEN AND PELVIS WITH CONTRAST TECHNIQUE: Multidetector CT imaging of the chest was performed using the standard protocol during bolus administration of intravenous contrast. Multiplanar CT image reconstructions and MIPs were obtained to evaluate the vascular anatomy. Multidetector CT imaging of the abdomen and  pelvis was performed using the standard protocol during bolus administration of intravenous contrast. CONTRAST:  126mL OMNIPAQUE IOHEXOL 350 MG/ML SOLN COMPARISON:  Plain films of earlier in the day. Abdominopelvic CT of 11/17/2018. No prior chest CT. FINDINGS: CTA CHEST FINDINGS Cardiovascular: The quality of this exam for evaluation of pulmonary embolism is moderate. Portions of exam are motion degraded. The bolus is relatively well timed.  No pulmonary embolism to the large segmental level. A right Port-A-Cath with tip at mid right atrium. Aortic and branch vessel atherosclerosis. Tortuous thoracic aorta. Mild cardiomegaly, with trace pericardial fluid. Multivessel coronary artery atherosclerosis. Mediastinum/Nodes: No mediastinal or hilar adenopathy. Tiny hiatal hernia. Lungs/Pleura: Small right pleural effusion. Probable scarring in the right apex. Right middle lobe 5 mm pulmonary nodule on 82/6. Left upper lobe 4 mm nodule on 73/6. Neither of these areas were included on prior abdominal CTs. Musculoskeletal: Moderate right greater than left gynecomastia. Widespread sclerotic osseous metastasis, including throughout the right scapula, vertebral bodies, and bilateral ribs. Review of the MIP images confirms the above findings. CT ABDOMEN and PELVIS FINDINGS Hepatobiliary: Normal liver. Normal gallbladder, without biliary ductal dilatation. Pancreas: Normal, without mass or ductal dilatation. Spleen: Normal in size, without focal abnormality. Adrenals/Urinary Tract: Normal adrenal glands. Normal kidneys, without hydronephrosis. Degraded evaluation of the pelvis, secondary to beam hardening artifact from bilateral hip arthroplasty. Grossly normal urinary bladder. Stomach/Bowel: Tiny hiatal hernia. Small amount of stool in the rectum. There is probable rectal wall thickening and surrounding edema, including on 77/11 and continuing to surround the anus on 85/11. Large amount of colonic stool more proximally. Normal  terminal ileum and appendix. Normal small bowel. Vascular/Lymphatic: Aortic and branch vessel atherosclerosis. No abdominopelvic adenopathy. Previously described inguinal adenopathy is no longer identified. Reproductive: Not well evaluated. Other: No gross free pelvic fluid. No abdominal ascites. No free intraperitoneal air. Musculoskeletal: Bilateral hip arthroplasty. Grade 1-2 anterolisthesis of L4 on L5. Review of the MIP images confirms the above findings. IMPRESSION: 1. No evidence of pulmonary embolism. Please see limitations above. 2. Extensive osseous metastasis throughout the chest. 3. Bilateral pulmonary nodules, above the level of scanning on prior abdominal CTs. Indeterminate. Cannot exclude pulmonary metastasis. 4. No evidence of soft tissue metastasis within the abdomen or pelvis. Previously described inguinal adenopathy has resolved. 5. Possible constipation. Suspicion of proctitis, as evidenced by rectal wall thickening and surrounding edema. This is nonspecific, but given clinical suspicion of fecal impaction, could represent stercoral colitis. 6. Degraded evaluation of the pelvis, secondary to beam hardening artifact from bilateral hip arthroplasty. 7. Coronary artery atherosclerosis. Aortic Atherosclerosis (ICD10-I70.0). 8. Bilateral gynecomastia. Electronically Signed   By: Abigail Miyamoto M.D.   On: 08/03/2019 20:13   Ct Abdomen Pelvis W Contrast  Result Date: 08/03/2019 CLINICAL DATA:  Constipation for 1 week with nausea and vomiting. Reportedly vomiting feces. Rule out impaction. History of prostate cancer. EXAM: CT ANGIOGRAPHY CHEST CT ABDOMEN AND PELVIS WITH CONTRAST TECHNIQUE: Multidetector CT imaging of the chest was performed using the standard protocol during bolus administration of intravenous contrast. Multiplanar CT image reconstructions and MIPs were obtained to evaluate the vascular anatomy. Multidetector CT imaging of the abdomen and pelvis was performed using the standard  protocol during bolus administration of intravenous contrast. CONTRAST:  115mL OMNIPAQUE IOHEXOL 350 MG/ML SOLN COMPARISON:  Plain films of earlier in the day. Abdominopelvic CT of 11/17/2018. No prior chest CT. FINDINGS: CTA CHEST FINDINGS Cardiovascular: The quality of this exam for evaluation of pulmonary embolism is moderate. Portions of exam are motion degraded. The bolus is relatively well timed. No pulmonary embolism to the large segmental level. A right Port-A-Cath with tip at mid right atrium. Aortic and branch vessel atherosclerosis. Tortuous thoracic aorta. Mild cardiomegaly, with trace pericardial fluid. Multivessel coronary artery atherosclerosis. Mediastinum/Nodes: No mediastinal or hilar adenopathy. Tiny hiatal hernia. Lungs/Pleura: Small right pleural effusion. Probable scarring in the right apex. Right middle lobe 5 mm pulmonary  nodule on 82/6. Left upper lobe 4 mm nodule on 73/6. Neither of these areas were included on prior abdominal CTs. Musculoskeletal: Moderate right greater than left gynecomastia. Widespread sclerotic osseous metastasis, including throughout the right scapula, vertebral bodies, and bilateral ribs. Review of the MIP images confirms the above findings. CT ABDOMEN and PELVIS FINDINGS Hepatobiliary: Normal liver. Normal gallbladder, without biliary ductal dilatation. Pancreas: Normal, without mass or ductal dilatation. Spleen: Normal in size, without focal abnormality. Adrenals/Urinary Tract: Normal adrenal glands. Normal kidneys, without hydronephrosis. Degraded evaluation of the pelvis, secondary to beam hardening artifact from bilateral hip arthroplasty. Grossly normal urinary bladder. Stomach/Bowel: Tiny hiatal hernia. Small amount of stool in the rectum. There is probable rectal wall thickening and surrounding edema, including on 77/11 and continuing to surround the anus on 85/11. Large amount of colonic stool more proximally. Normal terminal ileum and appendix. Normal small  bowel. Vascular/Lymphatic: Aortic and branch vessel atherosclerosis. No abdominopelvic adenopathy. Previously described inguinal adenopathy is no longer identified. Reproductive: Not well evaluated. Other: No gross free pelvic fluid. No abdominal ascites. No free intraperitoneal air. Musculoskeletal: Bilateral hip arthroplasty. Grade 1-2 anterolisthesis of L4 on L5. Review of the MIP images confirms the above findings. IMPRESSION: 1. No evidence of pulmonary embolism. Please see limitations above. 2. Extensive osseous metastasis throughout the chest. 3. Bilateral pulmonary nodules, above the level of scanning on prior abdominal CTs. Indeterminate. Cannot exclude pulmonary metastasis. 4. No evidence of soft tissue metastasis within the abdomen or pelvis. Previously described inguinal adenopathy has resolved. 5. Possible constipation. Suspicion of proctitis, as evidenced by rectal wall thickening and surrounding edema. This is nonspecific, but given clinical suspicion of fecal impaction, could represent stercoral colitis. 6. Degraded evaluation of the pelvis, secondary to beam hardening artifact from bilateral hip arthroplasty. 7. Coronary artery atherosclerosis. Aortic Atherosclerosis (ICD10-I70.0). 8. Bilateral gynecomastia. Electronically Signed   By: Abigail Miyamoto M.D.   On: 08/03/2019 20:13   Dg Abdomen Acute W/chest  Result Date: 08/03/2019 CLINICAL DATA:  73 year old male with concern for obstruction. History of prostate cancer with osseous metastasis. EXAM: DG ABDOMEN ACUTE W/ 1V CHEST COMPARISON:  Radiograph dated 06/05/2019 FINDINGS: The lungs are clear. There is no pleural effusion or pneumothorax. The cardiac silhouette is within normal limits. Atherosclerotic calcification of the aorta. Right pectoral Port-A-Cath in similar position. Large amount of stool noted throughout the colon. No bowel dilatation or evidence of obstruction. No free air or radiopaque calculi. Bilateral hip arthroplasties.  Prostate fiducial markers. The soft tissues are unremarkable. Sclerotic changes of the right scapula similar to prior radiograph in keeping with metastatic disease. IMPRESSION: 1. No acute cardiopulmonary process. 2. Constipation.  No evidence of bowel obstruction. Electronically Signed   By: Anner Crete M.D.   On: 08/03/2019 17:09      Assessment/Plan Principal Problem:   Sepsis ([place]) Active Problems:   Prostate cancer ([place])   Essential hypertension   Constipation   Noninfectious colitis     #1 sepsis due to UTI: Patient will be admitted.  Initiate Vanco and cefepime for sepsis protocol.  Urine and blood cultures obtained.  Most likely regular UTI.  We may de-escalate to IV Rocephin only.  #2 stercoral colitis: Patient will be aggressively treated for his constipation.  He has tried regular laxatives at home not working.  I will initiate GoLYTELY bowel prep and monitor.  Patient on IV antibiotics which will cover his proctitis  #3 essential hypertension: Resume home regimen  #4 metastatic prostate cancer: Continue home regimen and  follow oncology.   DVT prophylaxis: Lovenox Code Status: Full code Family Communication: Wife over the phone Disposition Plan: Home Consults called: None Admission status: Inpatient  Severity of Illness: The appropriate patient status for this patient is INPATIENT. Inpatient status is judged to be reasonable and necessary in order to provide the required intensity of service to ensure the patient's safety. The patient's presenting symptoms, physical exam findings, and initial radiographic and laboratory data in the context of their chronic comorbidities is felt to place them at high risk for further clinical deterioration. Furthermore, it is not anticipated that the patient will be medically stable for discharge from the hospital within 2 midnights of admission. The following factors support the patient status of inpatient.   " The patient's  presenting symptoms include nausea vomiting. " The worrisome physical exam findings include abdominal disc. " The initial radiographic and laboratory data are worrisome because of evidence of stool impaction " The chronic co-morbidities include prostate cancer.   * I certify that at the point of admission it is my clinical judgment that the patient will require inpatient hospital care spanning beyond 2 midnights from the point of admission due to high intensity of service, high risk for further deterioration and high frequency of surveillance required.Barbette Merino MD [facility] Pager [phone]  If 7PM-7AM, please contact night-coverage [email or web address] Password [facility]  08/03/2019, 9:25 PM

## 2019-08-03 NOTE — ED Provider Notes (Signed)
[facility] DEPT Provider Note   CSN: [id number] Arrival date & time: 08/03/19  1115     History   Chief Complaint Chief Complaint  Patient presents with   Constipation   Emesis    HPI Dennis Fischer is a 73 y.o. male with past medical history of prostate cancer metastasized to bone, GERD, hypertension, who presents today for evaluation of vomiting and constipation. History obtained from patient and chart review.  Patient saw his oncologist on 07/31/2019 at that time he was noted to be passing gas however not having a bowel movement.  They started him on Senokot and MiraLAX.  When he presented today he reported that he had not had a bowel movement recently.  He is unable to give me a specific timeframe.  While in the waiting room he reports "those meds started working" and he had a bowel movement.  He states that he is unsure if he has had any dark tarry sticky bowel movements stating "well my wife and care aide did not tell me that." He reports that today he had 3 episodes of vomiting which he described as dark brown or black and thought it was bile.    Chart review shows that patient spouse had called oncology today reported a very dark-colored bowel movement and vomiting stool, last time was at 730 this morning.  Note also reports that he fell out of bed on Sunday a.m.  Patient states "I fall all the time."  He denies striking his head or passing out.     HPI  Past Medical History:  Diagnosis Date   Acute blood loss anemia    Hypertension    Metastatic cancer to bone ([place]) dx'd 2018   Prostate cancer [facility])     Patient Active Problem List   Diagnosis Date Noted   Sepsis ([place]) 08/03/2019   Noninfectious colitis 08/03/2019   Constipation 05/15/2019   Elevated BUN    Transaminitis    Hypoalbuminemia due to protein-calorie malnutrition (HCC)    Steroid-induced hyperglycemia    Essential hypertension    Metastatic cancer to  spine ([place]) 03/11/2019   Neuropathic pain    Benign essential HTN    Cocaine use 03/10/2019   Cord compression myelopathy (HCC)    Spinal cord compression ([place]) 03/09/2019   Prostate cancer metastatic to bone ([place]) 12/22/2018   Port-A-Cath in place 07/02/2018   Goals of care, counseling/discussion 01/20/2018   Right knee pain 01/17/2018   Prostate cancer ([place]) 01/22/2017   Prediabetes 03/30/2016   Positive FIT (fecal immunochemical test) 11/22/2015   GERD (gastroesophageal reflux disease) 12/21/2014   Age-related nuclear cataract of both eyes 01/13/2013   Hypertensive retinopathy 01/13/2013   Essential (primary) hypertension 11/25/2012   Pure hypercholesterolemia 11/25/2012   History of total hip replacement 08/28/2010   Tobacco dependence syndrome 03/22/2006    Past Surgical History:  Procedure Laterality Date   HIP SURGERY Bilateral    IR IMAGING GUIDED PORT INSERTION  04/22/2018   LAMINECTOMY N/A 03/09/2019   Procedure: THORACIC SEVEN - THORACIC EIGHT - THORACIC NINE LAMINECTOMY WITH RESECTION OF EPIDURAL TUMOR;  Surgeon: Earnie Larsson, MD;  Location: [place];  Service: Neurosurgery;  Laterality: N/A;        Home Medications    Prior to Admission medications   Medication Sig Start Date End Date Taking? Authorizing Provider  acetaminophen (TYLENOL) 325 MG tablet Take 1-2 tablets (325-650 mg total) by mouth every 4 (four) hours as needed for mild pain. 03/27/19  Yes Love, Ivan Anchors, PA-C  amLODipine (NORVASC) 10 MG tablet TAKE 1 TABLET(10 MG) BY MOUTH DAILY Patient taking differently: Take 10 mg by mouth daily.  02/23/19  Yes [name], MD  CALCIUM 500/D 500-200 MG-UNIT tablet TAKE 1 TABLET BY MOUTH TWICE DAILY Patient taking differently: Take 1 tablet by mouth daily with breakfast.  05/21/19  Yes Shadad, Mathis Dad, MD  carvedilol (COREG) 12.5 MG tablet TAKE 1 TABLET BY MOUTH TWICE DAILY WITH A MEAL Patient taking differently: Take 12.5 mg by mouth 2 (two)  times daily with a meal.  05/26/19  Yes [name], MD  cyclobenzaprine (FLEXERIL) 10 MG tablet TAKE 1 TABLET BY MOUTH THREE TIMES DAILY AS NEEDED FOR MUSCLE SPASMS Patient taking differently: Take 10 mg by mouth 3 (three) times daily as needed for muscle spasms.  07/17/19  Yes [name], MD  gabapentin (NEURONTIN) 300 MG capsule Take 1 capsule (300 mg total) by mouth at bedtime. Patient taking differently: Take 300 mg by mouth daily.  01/27/19  Yes Tanner, Lyndon Code., PA-C  Multiple Vitamin (MULTIVITAMIN WITH MINERALS) TABS tablet Take 1 tablet by mouth daily.   Yes [provider]  pantoprazole (PROTONIX) 40 MG tablet Take 1 tablet (40 mg total) by mouth daily. 03/12/19  Yes Anderson, Chelsey L, DO  polyethylene glycol (MIRALAX / GLYCOLAX) 17 g packet Take 17 g by mouth daily as needed for mild constipation. 07/31/19  Yes Wyatt Portela, MD  prochlorperazine (COMPAZINE) 10 MG tablet TAKE 1 TABLET BY MOUTH EVERY 6 HOURS AS NEEDED FOR NAUSEA OR VOMITING Patient taking differently: Take 10 mg by mouth every 6 (six) hours as needed for nausea or vomiting.  06/11/18  Yes Wyatt Portela, MD  simvastatin (ZOCOR) 20 MG tablet Take 1 tablet (20 mg total) by mouth daily. 05/26/19  Yes [name], MD  dexamethasone (DECADRON) 2 MG tablet Take 1 tablet (2 mg total) by mouth every 12 (twelve) hours. Patient not taking: Reported on 08/03/2019 03/27/19   Love, Ivan Anchors, PA-C  HYDROcodone-acetaminophen (NORCO) 5-325 MG tablet Take 1 tablet by mouth every 6 (six) hours as needed for moderate pain. Patient not taking: Reported on 06/05/2019 04/28/19   Wyatt Portela, MD  lidocaine-prilocaine (EMLA) cream Apply 1 application topically as needed. Patient not taking: Reported on 08/03/2019 04/15/18   Wyatt Portela, MD  megestrol (MEGACE) 400 MG/10ML suspension Take 10 mLs (400 mg total) by mouth 2 (two) times daily. Patient not taking: Reported on 06/05/2019 04/27/19   Wyatt Portela, MD  ondansetron (ZOFRAN)  4 MG tablet Take 1 tablet (4 mg total) by mouth every 6 (six) hours as needed for nausea or vomiting. Patient not taking: Reported on 08/03/2019 03/11/19   Doristine Mango L, DO  senna-docusate (SENOKOT-S) 8.6-50 MG tablet Take 2 tablets by mouth 2 (two) times daily. Patient not taking: Reported on 08/03/2019 07/31/19   Wyatt Portela, MD    Family History Family History  Problem Relation Age of Onset   Prostate cancer Father     Social History Social History   Tobacco Use   Smoking status: Former Smoker    Packs/day: 0.25    Years: 50.00    Pack years: 12.50    Types: Cigarettes    Quit date: 09/10/2014    Years since quitting: 4.9   Smokeless tobacco: Never Used   Tobacco comment: Reports smoking only when he drank.  Substance Use Topics   Alcohol use: Yes  Comment: Once every 2 weeks.    Drug use: Yes    Types: Marijuana    Comment: Medical      Allergies   Lisinopril, Cat hair extract, Mangifera indica, Mango flavor, Other, Peanut-containing drug products, Pistachio nut (diagnostic), and Sunflower oil   Review of Systems Review of Systems  Constitutional: Negative for chills and fever.  HENT: Negative for congestion.   Respiratory: Negative for cough and shortness of breath.   Cardiovascular: Negative for chest pain.  Gastrointestinal: Positive for abdominal pain, constipation, nausea and vomiting. Negative for diarrhea and rectal pain.  Genitourinary: Negative for difficulty urinating and dysuria.  Musculoskeletal: Negative for back pain.  Skin: Negative for color change, rash and wound.  Neurological: Positive for weakness (Baseline per patient. ).  All other systems reviewed and are negative.    Physical Exam Updated Vital Signs BP 103/76    Pulse 98    Temp 98.7 F (37.1 C) (Oral)    Resp (!) 21    SpO2 98%   Physical Exam Vitals signs and nursing note reviewed.  Constitutional:      General: He is not in acute distress.    Appearance: He  is well-developed.  HENT:     Head: Normocephalic and atraumatic.     Mouth/Throat:     Mouth: Mucous membranes are moist.  Eyes:     Conjunctiva/sclera: Conjunctivae normal.  Neck:     Musculoskeletal: Normal range of motion and neck supple. No neck rigidity.  Cardiovascular:     Rate and Rhythm: Regular rhythm. Tachycardia present.     Heart sounds: No murmur.  Pulmonary:     Effort: Pulmonary effort is normal. No respiratory distress.     Breath sounds: Normal breath sounds.  Abdominal:     Palpations: Abdomen is soft.     Tenderness: There is no abdominal tenderness.  Genitourinary:    Comments: Brief with large amount of soft brown stool, sample for occult blood was obtained from the stool. Skin:    General: Skin is warm and dry.  Neurological:     Mental Status: He is alert and oriented to person, place, and time.     Cranial Nerves: No cranial nerve deficit.  Psychiatric:        Mood and Affect: Mood normal.        Behavior: Behavior normal.      ED Treatments / Results  Labs (all labs ordered are listed, but only abnormal results are displayed) Labs Reviewed  COMPREHENSIVE METABOLIC PANEL - Abnormal; Notable for the following components:      Result Value   Glucose, Bld 148 (*)    All other components within normal limits  CBC - Abnormal; Notable for the following components:   RBC 3.96 (*)    Hemoglobin 11.7 (*)    HCT 36.7 (*)    RDW 18.4 (*)    All other components within normal limits  URINALYSIS, ROUTINE W REFLEX MICROSCOPIC - Abnormal; Notable for the following components:   APPearance CLOUDY (*)    Ketones, ur 5 (*)    Protein, ur 30 (*)    Leukocytes,Ua LARGE (*)    WBC, UA >50 (*)    Bacteria, UA RARE (*)    All other components within normal limits  APTT - Abnormal; Notable for the following components:   aPTT 43 (*)    All other components within normal limits  CULTURE, BLOOD (ROUTINE X 2)  CULTURE, BLOOD (ROUTINE X 2)  SARS CORONAVIRUS 2  (TAT 6-24 HRS)  CULTURE, BLOOD (ROUTINE X 2)  CULTURE, BLOOD (ROUTINE X 2)  URINE CULTURE  LIPASE, BLOOD  LACTIC ACID, PLASMA  LACTIC ACID, PLASMA  PROTIME-INR  POC OCCULT BLOOD, ED  POC SARS CORONAVIRUS 2 AG -  ED    EKG EKG Interpretation  Date/Time:  Monday August 03 2019 16:03:52 EST Ventricular Rate:  125 PR Interval:    QRS Duration: 68 QT Interval:  263 QTC Calculation: 380 R Axis:   57 Text Interpretation: Sinus tachycardia Probable left atrial enlargement Low voltage, extremity leads Borderline repolarization abnormality Rate faster Confirmed by Ezequiel Essex [phone]) on 08/03/2019 4:36:21 PM   Radiology Ct Angio Chest Pe W/cm &/or Wo Cm  Result Date: 08/03/2019 CLINICAL DATA:  Constipation for 1 week with nausea and vomiting. Reportedly vomiting feces. Rule out impaction. History of prostate cancer. EXAM: CT ANGIOGRAPHY CHEST CT ABDOMEN AND PELVIS WITH CONTRAST TECHNIQUE: Multidetector CT imaging of the chest was performed using the standard protocol during bolus administration of intravenous contrast. Multiplanar CT image reconstructions and MIPs were obtained to evaluate the vascular anatomy. Multidetector CT imaging of the abdomen and pelvis was performed using the standard protocol during bolus administration of intravenous contrast. CONTRAST:  122mL OMNIPAQUE IOHEXOL 350 MG/ML SOLN COMPARISON:  Plain films of earlier in the day. Abdominopelvic CT of 11/17/2018. No prior chest CT. FINDINGS: CTA CHEST FINDINGS Cardiovascular: The quality of this exam for evaluation of pulmonary embolism is moderate. Portions of exam are motion degraded. The bolus is relatively well timed. No pulmonary embolism to the large segmental level. A right Port-A-Cath with tip at mid right atrium. Aortic and branch vessel atherosclerosis. Tortuous thoracic aorta. Mild cardiomegaly, with trace pericardial fluid. Multivessel coronary artery atherosclerosis. Mediastinum/Nodes: No mediastinal or  hilar adenopathy. Tiny hiatal hernia. Lungs/Pleura: Small right pleural effusion. Probable scarring in the right apex. Right middle lobe 5 mm pulmonary nodule on 82/6. Left upper lobe 4 mm nodule on 73/6. Neither of these areas were included on prior abdominal CTs. Musculoskeletal: Moderate right greater than left gynecomastia. Widespread sclerotic osseous metastasis, including throughout the right scapula, vertebral bodies, and bilateral ribs. Review of the MIP images confirms the above findings. CT ABDOMEN and PELVIS FINDINGS Hepatobiliary: Normal liver. Normal gallbladder, without biliary ductal dilatation. Pancreas: Normal, without mass or ductal dilatation. Spleen: Normal in size, without focal abnormality. Adrenals/Urinary Tract: Normal adrenal glands. Normal kidneys, without hydronephrosis. Degraded evaluation of the pelvis, secondary to beam hardening artifact from bilateral hip arthroplasty. Grossly normal urinary bladder. Stomach/Bowel: Tiny hiatal hernia. Small amount of stool in the rectum. There is probable rectal wall thickening and surrounding edema, including on 77/11 and continuing to surround the anus on 85/11. Large amount of colonic stool more proximally. Normal terminal ileum and appendix. Normal small bowel. Vascular/Lymphatic: Aortic and branch vessel atherosclerosis. No abdominopelvic adenopathy. Previously described inguinal adenopathy is no longer identified. Reproductive: Not well evaluated. Other: No gross free pelvic fluid. No abdominal ascites. No free intraperitoneal air. Musculoskeletal: Bilateral hip arthroplasty. Grade 1-2 anterolisthesis of L4 on L5. Review of the MIP images confirms the above findings. IMPRESSION: 1. No evidence of pulmonary embolism. Please see limitations above. 2. Extensive osseous metastasis throughout the chest. 3. Bilateral pulmonary nodules, above the level of scanning on prior abdominal CTs. Indeterminate. Cannot exclude pulmonary metastasis. 4. No  evidence of soft tissue metastasis within the abdomen or pelvis. Previously described inguinal adenopathy has resolved. 5. Possible constipation. Suspicion of proctitis, as evidenced by rectal wall  thickening and surrounding edema. This is nonspecific, but given clinical suspicion of fecal impaction, could represent stercoral colitis. 6. Degraded evaluation of the pelvis, secondary to beam hardening artifact from bilateral hip arthroplasty. 7. Coronary artery atherosclerosis. Aortic Atherosclerosis (ICD10-I70.0). 8. Bilateral gynecomastia. Electronically Signed   By: Abigail Miyamoto M.D.   On: 08/03/2019 20:13   Ct Abdomen Pelvis W Contrast  Result Date: 08/03/2019 CLINICAL DATA:  Constipation for 1 week with nausea and vomiting. Reportedly vomiting feces. Rule out impaction. History of prostate cancer. EXAM: CT ANGIOGRAPHY CHEST CT ABDOMEN AND PELVIS WITH CONTRAST TECHNIQUE: Multidetector CT imaging of the chest was performed using the standard protocol during bolus administration of intravenous contrast. Multiplanar CT image reconstructions and MIPs were obtained to evaluate the vascular anatomy. Multidetector CT imaging of the abdomen and pelvis was performed using the standard protocol during bolus administration of intravenous contrast. CONTRAST:  122mL OMNIPAQUE IOHEXOL 350 MG/ML SOLN COMPARISON:  Plain films of earlier in the day. Abdominopelvic CT of 11/17/2018. No prior chest CT. FINDINGS: CTA CHEST FINDINGS Cardiovascular: The quality of this exam for evaluation of pulmonary embolism is moderate. Portions of exam are motion degraded. The bolus is relatively well timed. No pulmonary embolism to the large segmental level. A right Port-A-Cath with tip at mid right atrium. Aortic and branch vessel atherosclerosis. Tortuous thoracic aorta. Mild cardiomegaly, with trace pericardial fluid. Multivessel coronary artery atherosclerosis. Mediastinum/Nodes: No mediastinal or hilar adenopathy. Tiny hiatal hernia.  Lungs/Pleura: Small right pleural effusion. Probable scarring in the right apex. Right middle lobe 5 mm pulmonary nodule on 82/6. Left upper lobe 4 mm nodule on 73/6. Neither of these areas were included on prior abdominal CTs. Musculoskeletal: Moderate right greater than left gynecomastia. Widespread sclerotic osseous metastasis, including throughout the right scapula, vertebral bodies, and bilateral ribs. Review of the MIP images confirms the above findings. CT ABDOMEN and PELVIS FINDINGS Hepatobiliary: Normal liver. Normal gallbladder, without biliary ductal dilatation. Pancreas: Normal, without mass or ductal dilatation. Spleen: Normal in size, without focal abnormality. Adrenals/Urinary Tract: Normal adrenal glands. Normal kidneys, without hydronephrosis. Degraded evaluation of the pelvis, secondary to beam hardening artifact from bilateral hip arthroplasty. Grossly normal urinary bladder. Stomach/Bowel: Tiny hiatal hernia. Small amount of stool in the rectum. There is probable rectal wall thickening and surrounding edema, including on 77/11 and continuing to surround the anus on 85/11. Large amount of colonic stool more proximally. Normal terminal ileum and appendix. Normal small bowel. Vascular/Lymphatic: Aortic and branch vessel atherosclerosis. No abdominopelvic adenopathy. Previously described inguinal adenopathy is no longer identified. Reproductive: Not well evaluated. Other: No gross free pelvic fluid. No abdominal ascites. No free intraperitoneal air. Musculoskeletal: Bilateral hip arthroplasty. Grade 1-2 anterolisthesis of L4 on L5. Review of the MIP images confirms the above findings. IMPRESSION: 1. No evidence of pulmonary embolism. Please see limitations above. 2. Extensive osseous metastasis throughout the chest. 3. Bilateral pulmonary nodules, above the level of scanning on prior abdominal CTs. Indeterminate. Cannot exclude pulmonary metastasis. 4. No evidence of soft tissue metastasis within the  abdomen or pelvis. Previously described inguinal adenopathy has resolved. 5. Possible constipation. Suspicion of proctitis, as evidenced by rectal wall thickening and surrounding edema. This is nonspecific, but given clinical suspicion of fecal impaction, could represent stercoral colitis. 6. Degraded evaluation of the pelvis, secondary to beam hardening artifact from bilateral hip arthroplasty. 7. Coronary artery atherosclerosis. Aortic Atherosclerosis (ICD10-I70.0). 8. Bilateral gynecomastia. Electronically Signed   By: Abigail Miyamoto M.D.   On: 08/03/2019 20:13   Dg Abdomen  Acute W/chest  Result Date: 08/03/2019 CLINICAL DATA:  73 year old male with concern for obstruction. History of prostate cancer with osseous metastasis. EXAM: DG ABDOMEN ACUTE W/ 1V CHEST COMPARISON:  Radiograph dated 06/05/2019 FINDINGS: The lungs are clear. There is no pleural effusion or pneumothorax. The cardiac silhouette is within normal limits. Atherosclerotic calcification of the aorta. Right pectoral Port-A-Cath in similar position. Large amount of stool noted throughout the colon. No bowel dilatation or evidence of obstruction. No free air or radiopaque calculi. Bilateral hip arthroplasties. Prostate fiducial markers. The soft tissues are unremarkable. Sclerotic changes of the right scapula similar to prior radiograph in keeping with metastatic disease. IMPRESSION: 1. No acute cardiopulmonary process. 2. Constipation.  No evidence of bowel obstruction. Electronically Signed   By: Anner Crete M.D.   On: 08/03/2019 17:09    Procedures .Critical Care Performed by: Lorin Glass, PA-C Authorized by: Lorin Glass, PA-C   Critical care provider statement:    Critical care time (minutes):  45   Critical care was necessary to treat or prevent imminent or life-threatening deterioration of the following conditions:  Sepsis   Critical care was time spent personally by me on the following activities:   Discussions with consultants, evaluation of patient's response to treatment, examination of patient, ordering and performing treatments and interventions, ordering and review of laboratory studies, ordering and review of radiographic studies, pulse oximetry, re-evaluation of patient's condition, obtaining history from patient or surrogate and review of old charts   (including critical care time)  Medications Ordered in ED Medications  sodium chloride (PF) 0.9 % injection (has no administration in time range)  ceFEPIme (MAXIPIME) 2 g in sodium chloride 0.9 % 100 mL IVPB (has no administration in time range)  vancomycin (VANCOCIN) IVPB 1000 mg/200 mL premix (has no administration in time range)  sodium chloride 0.9 % bolus 500 mL (0 mLs Intravenous Stopped 08/03/19 1718)  iohexol (OMNIPAQUE) 350 MG/ML injection 100 mL (100 mLs Intravenous Contrast Given 08/03/19 1926)  ceFEPIme (MAXIPIME) 2 g in sodium chloride 0.9 % 100 mL IVPB (0 g Intravenous Stopped 08/03/19 2115)  metroNIDAZOLE (FLAGYL) IVPB 500 mg (0 mg Intravenous Stopped 08/03/19 2256)  vancomycin (VANCOCIN) IVPB 1000 mg/200 mL premix (0 mg Intravenous Stopped 08/03/19 2256)  acetaminophen (TYLENOL) tablet 650 mg (650 mg Oral Given 08/03/19 1953)  0.9 %  sodium chloride infusion ( Intravenous Stopped 08/03/19 2257)     Initial Impression / Assessment and Plan / ED Course  I have reviewed the triage vital signs and the nursing notes.  Pertinent labs & imaging results that were available during my care of the patient were reviewed by me and considered in my medical decision making (see chart for details).       Patient presents today for evaluation of concern of constipation.  Prior to being roomed he had a large bowel movement.  While in the ER he was noted to be persistently tachycardic.  He did not initially have a fever, however while in the emergency room he developed a fever at that point meeting sepsis criteria.  Code sepsis was  called and he was started on broad-spectrum antibiotics.  Urine is concerning for infection with over 50 white blood cells with rare bacteria and only 0-5 squamous epithelial cells. Cute abdomen and chest series was obtained without evidence of obstruction however showed significant retained stool in the colon. Given his active cancer, along with tachycardia without fever at the time increased concern for PE.  CT a  PE study and CT abdomen pelvis with contrast was obtained showing concern for proctitis with surrounding edema and possible stercoral colitis.  Occult blood was negative.  Patient was given IV fluids and tylenol.  Lactic acid was not elevated.    Patient did not become hypotensive while in my care.    Patient was seen as a shared visit with Dr. Wyvonnia Dusky.   I spoke with Dr. Jonelle Sidle who will see patient for admission.   Final Clinical Impressions(s) / ED Diagnoses   Final diagnoses:  Sepsis without acute organ dysfunction, due to unspecified organism [facility])  Proctitis  Constipation, unspecified constipation type    ED Discharge Orders    None       Ollen Gross 08/04/19 0019    Ezequiel Essex, MD 08/04/19 1155

## 2019-08-04 ENCOUNTER — Encounter (HOSPITAL_COMMUNITY): Payer: Self-pay

## 2019-08-04 ENCOUNTER — Other Ambulatory Visit: Payer: Self-pay

## 2019-08-04 DIAGNOSIS — N390 Urinary tract infection, site not specified: Secondary | ICD-10-CM

## 2019-08-04 DIAGNOSIS — K5901 Slow transit constipation: Secondary | ICD-10-CM

## 2019-08-04 DIAGNOSIS — K6289 Other specified diseases of anus and rectum: Secondary | ICD-10-CM

## 2019-08-04 LAB — COMPREHENSIVE METABOLIC PANEL
ALT: 11 U/L (ref 0–44)
AST: 13 U/L — ABNORMAL LOW (ref 15–41)
Albumin: 3.1 g/dL — ABNORMAL LOW (ref 3.5–5.0)
Alkaline Phosphatase: 47 U/L (ref 38–126)
Anion gap: 6 (ref 5–15)
BUN: 11 mg/dL (ref 8–23)
CO2: 23 mmol/L (ref 22–32)
Calcium: 8.2 mg/dL — ABNORMAL LOW (ref 8.9–10.3)
Chloride: 111 mmol/L (ref 98–111)
Creatinine, Ser: 0.81 mg/dL (ref 0.61–1.24)
GFR calc Af Amer: >60 mL/min (ref >60)
GFR calc non Af Amer: >60 mL/min (ref >60)
Glucose, Bld: 131 mg/dL — ABNORMAL HIGH (ref 70–99)
Potassium: 3.2 mmol/L — ABNORMAL LOW (ref 3.5–5.1)
Sodium: 140 mmol/L (ref 135–145)
Total Bilirubin: 0.7 mg/dL (ref 0.3–1.2)
Total Protein: 6 g/dL — ABNORMAL LOW (ref 6.5–8.1)

## 2019-08-04 LAB — SARS CORONAVIRUS 2 (TAT 6-24 HRS): SARS Coronavirus 2: NEGATIVE

## 2019-08-04 LAB — CBC
HCT: 27.6 % — ABNORMAL LOW (ref 39.0–52.0)
Hemoglobin: 8.8 g/dL — ABNORMAL LOW (ref 13.0–17.0)
MCH: 30.2 pg (ref 26.0–34.0)
MCHC: 31.9 g/dL (ref 30.0–36.0)
MCV: 94.8 fL (ref 80.0–100.0)
Platelets: 198 10*3/uL (ref 150–400)
RBC: 2.91 MIL/uL — ABNORMAL LOW (ref 4.22–5.81)
RDW: 18.6 % — ABNORMAL HIGH (ref 11.5–15.5)
WBC: 4.2 10*3/uL (ref 4.0–10.5)
nRBC: 0 % (ref 0.0–0.2)

## 2019-08-04 MED ORDER — ONDANSETRON HCL 4 MG PO TABS: 4.0000 mg | ORAL_TABLET | Freq: Four times a day (QID) | ORAL | Status: DC | PRN

## 2019-08-04 MED ORDER — DEXTROSE-NACL 5-0.9 % IV SOLN
INTRAVENOUS | Status: DC
  Administered 2019-08-04: 01:00:00 via INTRAVENOUS

## 2019-08-04 MED ORDER — VANCOMYCIN HCL IN DEXTROSE 1-5 GM/200ML-% IV SOLN: 1000.0000 mg | Freq: Two times a day (BID) | INTRAVENOUS | Status: DC

## 2019-08-04 MED ORDER — ADULT MULTIVITAMIN W/MINERALS CH
1.0000 | ORAL_TABLET | Freq: Every day | ORAL | Status: DC
  Administered 2019-08-04 – 2019-08-06 (×3): 1 via ORAL
  Filled 2019-08-04 (×3): qty 1

## 2019-08-04 MED ORDER — PROCHLORPERAZINE MALEATE 10 MG PO TABS: 10.0000 mg | ORAL_TABLET | Freq: Four times a day (QID) | ORAL | Status: DC | PRN

## 2019-08-04 MED ORDER — CALCIUM CARBONATE-VITAMIN D 500-200 MG-UNIT PO TABS: 1.0000 | ORAL_TABLET | Freq: Every day | ORAL | Status: DC

## 2019-08-04 MED ORDER — METRONIDAZOLE IN NACL 5-0.79 MG/ML-% IV SOLN
500.0000 mg | Freq: Three times a day (TID) | INTRAVENOUS | Status: DC
  Administered 2019-08-04 – 2019-08-06 (×7): 500 mg via INTRAVENOUS
  Filled 2019-08-04 (×7): qty 100

## 2019-08-04 MED ORDER — AMLODIPINE BESYLATE 10 MG PO TABS
10.0000 mg | ORAL_TABLET | Freq: Every day | ORAL | Status: DC
  Administered 2019-08-04 – 2019-08-05 (×2): 10 mg via ORAL
  Filled 2019-08-04 (×2): qty 1

## 2019-08-04 MED ORDER — CYCLOBENZAPRINE HCL 10 MG PO TABS: 10.0000 mg | ORAL_TABLET | Freq: Three times a day (TID) | ORAL | Status: DC | PRN

## 2019-08-04 MED ORDER — CHLORHEXIDINE GLUCONATE CLOTH 2 % EX PADS
6.0000 | MEDICATED_PAD | Freq: Every day | CUTANEOUS | Status: DC
  Administered 2019-08-04 – 2019-08-05 (×2): 6 via TOPICAL

## 2019-08-04 MED ORDER — SODIUM CHLORIDE 0.9% FLUSH
10.0000 mL | INTRAVENOUS | Status: DC | PRN
  Administered 2019-08-04 – 2019-08-06 (×2): 10 mL
  Filled 2019-08-04 (×2): qty 40

## 2019-08-04 MED ORDER — ACETAMINOPHEN 325 MG PO TABS: 325.0000 mg | ORAL_TABLET | ORAL | Status: DC | PRN

## 2019-08-04 MED ORDER — SODIUM CHLORIDE 0.9 % IV SOLN
2.0000 g | INTRAVENOUS | Status: DC
  Administered 2019-08-04 – 2019-08-06 (×3): 2 g via INTRAVENOUS
  Filled 2019-08-04: qty 2
  Filled 2019-08-04: qty 20
  Filled 2019-08-04 (×2): qty 2

## 2019-08-04 MED ORDER — POLYETHYLENE GLYCOL 3350 17 G PO PACK: 17.0000 g | PACK | Freq: Every day | ORAL | Status: DC | PRN

## 2019-08-04 MED ORDER — PANTOPRAZOLE SODIUM 40 MG PO TBEC
40.0000 mg | DELAYED_RELEASE_TABLET | Freq: Every day | ORAL | Status: DC
  Administered 2019-08-04 – 2019-08-06 (×3): 40 mg via ORAL
  Filled 2019-08-04 (×3): qty 1

## 2019-08-04 MED ORDER — ONDANSETRON HCL 4 MG/2ML IJ SOLN: 4.0000 mg | Freq: Four times a day (QID) | INTRAMUSCULAR | Status: DC | PRN

## 2019-08-04 MED ORDER — GABAPENTIN 300 MG PO CAPS
300.0000 mg | ORAL_CAPSULE | Freq: Every day | ORAL | Status: DC
  Administered 2019-08-04 – 2019-08-06 (×3): 300 mg via ORAL
  Filled 2019-08-04 (×3): qty 1

## 2019-08-04 MED ORDER — BOOST / RESOURCE BREEZE PO LIQD CUSTOM
1.0000 | Freq: Three times a day (TID) | ORAL | Status: DC
  Administered 2019-08-04 – 2019-08-06 (×6): 1 via ORAL

## 2019-08-04 MED ORDER — PEG 3350-KCL-NA BICARB-NACL 420 G PO SOLR
4000.0000 mL | Freq: Once | ORAL | Status: AC
  Administered 2019-08-04: 4000 mL via ORAL

## 2019-08-04 MED ORDER — POTASSIUM CHLORIDE 10 MEQ/100ML IV SOLN
10.0000 meq | INTRAVENOUS | Status: AC
  Administered 2019-08-04 (×2): 10 meq via INTRAVENOUS
  Filled 2019-08-04: qty 100

## 2019-08-04 MED ORDER — SIMVASTATIN 10 MG PO TABS
20.0000 mg | ORAL_TABLET | Freq: Every day | ORAL | Status: DC
  Administered 2019-08-04 – 2019-08-05 (×2): 20 mg via ORAL
  Filled 2019-08-04 (×2): qty 2

## 2019-08-04 MED ORDER — ENOXAPARIN SODIUM 40 MG/0.4ML ~~LOC~~ SOLN
40.0000 mg | Freq: Every day | SUBCUTANEOUS | Status: DC
  Administered 2019-08-04 – 2019-08-06 (×3): 40 mg via SUBCUTANEOUS
  Filled 2019-08-04 (×2): qty 0.4

## 2019-08-04 MED ORDER — CARVEDILOL 12.5 MG PO TABS
12.5000 mg | ORAL_TABLET | Freq: Two times a day (BID) | ORAL | Status: DC
  Administered 2019-08-04 – 2019-08-06 (×5): 12.5 mg via ORAL
  Filled 2019-08-04 (×5): qty 1

## 2019-08-04 NOTE — Progress Notes (Signed)
New Admission Note:    Arrival Method: Bed Mental Orientation:  Alert Telemetry: Box 50 Assessment: Skin: Abrasion legs,. Dry Iv: Port assessed  Pain:  0/10 Safety Measures: Safety Fall Prevention Plan has been given, discussed and signed Admission: Completed [facility] Orientation: Patient has been orientated to the room, unit, and staff.    Orders have been reviewed and implemented. Will continue to monitor the patient. Call light has been placed within reach and bed alarm has been activated.

## 2019-08-04 NOTE — Progress Notes (Signed)
Initial Nutrition Assessment  INTERVENTION:   -Boost Breeze po TID, each supplement provides 250 kcal and 9 grams of protein -Placed handout with tips to increase fiber in discharge instructions  NUTRITION DIAGNOSIS:   Inadequate oral intake related to nausea, vomiting, constipation as evidenced by meal completion < 25%.  GOAL:   Patient will meet greater than or equal to 90% of their needs  MONITOR:   PO intake, Supplement acceptance, Labs, Weight trends, I & O's  REASON FOR ASSESSMENT:   Malnutrition Screening Tool    ASSESSMENT:   73 y.o. male with medical history significant of prostate cancer with multiple metastasis to the bone and other parts of the body, GERD, hypertension, recurrent constipation, previous GI bleed who presented with bilious vomiting noted to include feces.  Patient has taken Senokot and MiraLAX for constipation given from his oncologist 3 days ago.  His continues to have significant abdominal distention.  Patient has had 3 episode of emesis prior to coming to the hospital.  In the ER he was seen and evaluated and appears to have evidence of sepsis by criteria and possible UTI.  Patient reports having N/V PTA related to constipation. Pt currently having BMs but needing bowel regimen.  Pt on clear liquids at this time, completed 25% of clear liquid tray this morning. Will order Boost Breeze for added kcals and protein.  Will place handout on increasing fiber in discharge instructions with emphasis on maintaining fluid intake to prevent constipation.  Suspect that once pt's constipation resolves he will eat better. Will continue to monitor.  Per weight records, pt has lost 25 lbs since 6/30 (12% wt loss x 5 months, significant for time frame).  I/Os: +1.9L since admit UOP: 400 ml x 24 hrs   Medications: Multivitamin with minerals daily, D5 infusion, IV KCl Labs reviewed: Low K   NUTRITION - FOCUSED PHYSICAL EXAM:  Deferred.  Diet Order:   Diet  Order            Diet clear liquid Room service appropriate? Yes; Fluid consistency: Thin  Diet effective now              EDUCATION NEEDS:   Education needs have been addressed  Skin:  Skin Assessment: Reviewed RN Assessment  Last BM:  11/24 -type 2  Height:   Ht Readings from Last 1 Encounters:  08/04/19 6' (1.829 m)    Weight:   Wt Readings from Last 1 Encounters:  07/31/19 77.1 kg    Ideal Body Weight:  80.9 kg  BMI:  Body mass index is 23.06 kg/m.  Estimated Nutritional Needs:   Kcal:  1900-2100  Protein:  80-90g  Fluid:  2L/day  Clayton Bibles, MS, RD, LDN Inpatient Clinical Dietitian Pager: [phone] After Hours Pager: [phone]

## 2019-08-04 NOTE — ED Notes (Signed)
ED TO INPATIENT HANDOFF REPORT  Name/Age/Gender Dennis Fischer 73 y.o. male  Code Status Code Status History    Date Active Date Inactive Code Status Order ID Comments User Context   03/11/2019 1901 03/27/2019 1821 Full Code [id number]  Flora Lipps Inpatient   03/10/2019 0102 03/11/2019 1900 Full Code [id number]  Earnie Larsson, MD Inpatient   03/10/2019 0102 03/10/2019 0102 Full Code [id number]  Matilde Haymaker, MD Inpatient   Advance Care Planning Activity      Home/SNF/Other Home  Chief Complaint constipation emesis  Level of Care/Admitting Diagnosis ED Disposition    ED Disposition Condition [place]: [place] [100102]  Level of Care: Telemetry [5]  Admit to tele based on following criteria: Complex arrhythmia (Bradycardia/Tachycardia)  Covid Evaluation: Asymptomatic Screening Protocol (No Symptoms)  Diagnosis: Sepsis ([place]) [id number]  Admitting Physician: Elwyn Reach [[id number]]  Attending Physician: Elwyn Reach [[id number]]  Estimated length of stay: past midnight tomorrow  Certification:: I certify this patient will need inpatient services for at least 2 midnights  PT Class (Do Not Modify): Inpatient [101]  PT Acc Code (Do Not Modify): Private [1]       Medical History Past Medical History:  Diagnosis Date  . Acute blood loss anemia   . Hypertension   . Metastatic cancer to bone ([place]) dx'd 2018  . Prostate cancer ([facility])     Allergies Allergies  Allergen Reactions  . Lisinopril Swelling    Facial swelling  . Cat Hair Extract     eyes swell  . Mangifera Indica     MANGO --  . Mango Flavor     throat swells with mango  . Other   . Peanut-Containing Drug Products   . Pistachio Nut (Diagnostic)     hands and feet burn/itch  . Sunflower Oil     Sunflower seed allergy    IV Location/Drains/Wounds Patient Lines/Drains/Airways Status   Active Line/Drains/Airways    Name:   Placement date:   Placement time:    Site:   Days:   Implanted Port 04/22/18 Left Chest   04/22/18    -    Chest   469   Incision (Closed) 03/09/19 Back   03/09/19    2327     148          Labs/Imaging Results for orders placed or performed during the hospital encounter of 08/03/19 (from the past 48 hour(s))  Lipase, blood     Status: None   Collection Time: 08/03/19 12:15 PM  Result Value Ref Range   Lipase 20 11 - 51 U/L    Comment: Performed at [facility], [place] [address], [place], [place] [address]  Comprehensive metabolic panel     Status: Abnormal   Collection Time: 08/03/19 12:15 PM  Result Value Ref Range   Sodium 138 135 - 145 mmol/L   Potassium 3.7 3.5 - 5.1 mmol/L   Chloride 104 98 - 111 mmol/L   CO2 22 22 - 32 mmol/L   Glucose, Bld 148 (H) 70 - 99 mg/dL   BUN 13 8 - 23 mg/dL   Creatinine, Ser 0.96 0.61 - 1.24 mg/dL   Calcium 9.1 8.9 - 10.3 mg/dL   Total Protein 7.5 6.5 - 8.1 g/dL   Albumin 3.9 3.5 - 5.0 g/dL   AST 16 15 - 41 U/L   ALT 13 0 - 44 U/L   Alkaline Phosphatase 65 38 - 126 U/L  Total Bilirubin 0.5 0.3 - 1.2 mg/dL   GFR calc non Af Amer >60 >60 mL/min   GFR calc Af Amer >60 >60 mL/min   Anion gap 12 5 - 15    Comment: Performed at [facility], [place] [address], [place], [place] [address]  CBC     Status: Abnormal   Collection Time: 08/03/19 12:15 PM  Result Value Ref Range   WBC 5.5 4.0 - 10.5 K/uL   RBC 3.96 (L) 4.22 - 5.81 MIL/uL   Hemoglobin 11.7 (L) 13.0 - 17.0 g/dL   HCT 36.7 (L) 39.0 - 52.0 %   MCV 92.7 80.0 - 100.0 fL   MCH 29.5 26.0 - 34.0 pg   MCHC 31.9 30.0 - 36.0 g/dL   RDW 18.4 (H) 11.5 - 15.5 %   Platelets 306 150 - 400 K/uL   nRBC 0.0 0.0 - 0.2 %    Comment: Performed at [facility], [place] [address], [place], [place] [address]  Lactic acid, plasma     Status: None   Collection Time: 08/03/19  4:19 PM  Result Value Ref Range   Lactic Acid, Venous 1.8 0.5 - 1.9 mmol/L    Comment: Performed at [facility], [place] [address] [place], [place] [address]  APTT     Status: Abnormal   Collection Time: 08/03/19  4:19 PM  Result Value Ref Range   aPTT 43 (H) 24 - 36 seconds    Comment:        IF BASELINE aPTT IS ELEVATED, SUGGEST PATIENT RISK ASSESSMENT BE USED TO DETERMINE APPROPRIATE ANTICOAGULANT THERAPY. Performed at [facility], [place] [address] [place], [place] [address]   Protime-INR     Status: None   Collection Time: 08/03/19  4:19 PM  Result Value Ref Range   Prothrombin Time 14.9 11.4 - 15.2 seconds   INR 1.2 0.8 - 1.2    Comment: (NOTE) INR goal varies based on device and disease states. Performed at [facility], [place] [address] [place], [place] [address]   POC occult blood, ED     Status: None   Collection Time: 08/03/19  4:34 PM  Result Value Ref Range   Fecal Occult Bld NEGATIVE NEGATIVE  Urinalysis, Routine w reflex microscopic     Status: Abnormal   Collection Time: 08/03/19  5:23 PM  Result Value Ref Range   Color, Urine YELLOW YELLOW   APPearance CLOUDY (A) CLEAR   Specific Gravity, Urine 1.010 1.005 - 1.030   pH 7.0 5.0 - 8.0   Glucose, UA NEGATIVE NEGATIVE mg/dL   Hgb urine dipstick NEGATIVE NEGATIVE   Bilirubin Urine NEGATIVE NEGATIVE   Ketones, ur 5 (A) NEGATIVE mg/dL   Protein, ur 30 (A) NEGATIVE mg/dL   Nitrite NEGATIVE NEGATIVE   Leukocytes,Ua LARGE (A) NEGATIVE   RBC / HPF 6-10 0 - 5 RBC/hpf   WBC, UA >50 (H) 0 - 5 WBC/hpf   Bacteria, UA RARE (A) NONE SEEN   Squamous Epithelial / LPF 0-5 0 - 5   Mucus PRESENT     Comment: Performed at [facility], [place] [address], [place], [place] [address]  Lactic acid, plasma     Status: None   Collection Time: 08/03/19  6:13 PM  Result Value Ref Range   Lactic Acid, Venous 1.2 0.5 - 1.9 mmol/L    Comment: Performed at [facility], [place] [address], [place], [place] [address]  Ct Angio Chest Pe W/cm &/or Wo  Cm  Result Date: 08/03/2019 CLINICAL DATA:  Constipation for 1 week with nausea and vomiting. Reportedly vomiting feces. Rule out impaction. History of prostate cancer. EXAM: CT ANGIOGRAPHY CHEST CT ABDOMEN AND PELVIS WITH CONTRAST TECHNIQUE: Multidetector CT imaging of the chest was performed using the standard protocol during bolus administration of intravenous contrast. Multiplanar CT image reconstructions and MIPs were obtained to evaluate the vascular anatomy. Multidetector CT imaging of the abdomen and pelvis was performed using the standard protocol during bolus administration of intravenous contrast. CONTRAST:  118mL OMNIPAQUE IOHEXOL 350 MG/ML SOLN COMPARISON:  Plain films of earlier in the day. Abdominopelvic CT of 11/17/2018. No prior chest CT. FINDINGS: CTA CHEST FINDINGS Cardiovascular: The quality of this exam for evaluation of pulmonary embolism is moderate. Portions of exam are motion degraded. The bolus is relatively well timed. No pulmonary embolism to the large segmental level. A right Port-A-Cath with tip at mid right atrium. Aortic and branch vessel atherosclerosis. Tortuous thoracic aorta. Mild cardiomegaly, with trace pericardial fluid. Multivessel coronary artery atherosclerosis. Mediastinum/Nodes: No mediastinal or hilar adenopathy. Tiny hiatal hernia. Lungs/Pleura: Small right pleural effusion. Probable scarring in the right apex. Right middle lobe 5 mm pulmonary nodule on 82/6. Left upper lobe 4 mm nodule on 73/6. Neither of these areas were included on prior abdominal CTs. Musculoskeletal: Moderate right greater than left gynecomastia. Widespread sclerotic osseous metastasis, including throughout the right scapula, vertebral bodies, and bilateral ribs. Review of the MIP images confirms the above findings. CT ABDOMEN and PELVIS FINDINGS Hepatobiliary: Normal liver. Normal gallbladder, without biliary ductal dilatation. Pancreas: Normal, without mass or ductal dilatation. Spleen: Normal  in size, without focal abnormality. Adrenals/Urinary Tract: Normal adrenal glands. Normal kidneys, without hydronephrosis. Degraded evaluation of the pelvis, secondary to beam hardening artifact from bilateral hip arthroplasty. Grossly normal urinary bladder. Stomach/Bowel: Tiny hiatal hernia. Small amount of stool in the rectum. There is probable rectal wall thickening and surrounding edema, including on 77/11 and continuing to surround the anus on 85/11. Large amount of colonic stool more proximally. Normal terminal ileum and appendix. Normal small bowel. Vascular/Lymphatic: Aortic and branch vessel atherosclerosis. No abdominopelvic adenopathy. Previously described inguinal adenopathy is no longer identified. Reproductive: Not well evaluated. Other: No gross free pelvic fluid. No abdominal ascites. No free intraperitoneal air. Musculoskeletal: Bilateral hip arthroplasty. Grade 1-2 anterolisthesis of L4 on L5. Review of the MIP images confirms the above findings. IMPRESSION: 1. No evidence of pulmonary embolism. Please see limitations above. 2. Extensive osseous metastasis throughout the chest. 3. Bilateral pulmonary nodules, above the level of scanning on prior abdominal CTs. Indeterminate. Cannot exclude pulmonary metastasis. 4. No evidence of soft tissue metastasis within the abdomen or pelvis. Previously described inguinal adenopathy has resolved. 5. Possible constipation. Suspicion of proctitis, as evidenced by rectal wall thickening and surrounding edema. This is nonspecific, but given clinical suspicion of fecal impaction, could represent stercoral colitis. 6. Degraded evaluation of the pelvis, secondary to beam hardening artifact from bilateral hip arthroplasty. 7. Coronary artery atherosclerosis. Aortic Atherosclerosis (ICD10-I70.0). 8. Bilateral gynecomastia. Electronically Signed   By: Abigail Miyamoto M.D.   On: 08/03/2019 20:13   Ct Abdomen Pelvis W Contrast  Result Date: 08/03/2019 CLINICAL DATA:   Constipation for 1 week with nausea and vomiting. Reportedly vomiting feces. Rule out impaction. History of prostate cancer. EXAM: CT ANGIOGRAPHY CHEST CT ABDOMEN AND PELVIS WITH CONTRAST TECHNIQUE: Multidetector CT imaging of the chest was performed using the standard protocol during bolus administration of  intravenous contrast. Multiplanar CT image reconstructions and MIPs were obtained to evaluate the vascular anatomy. Multidetector CT imaging of the abdomen and pelvis was performed using the standard protocol during bolus administration of intravenous contrast. CONTRAST:  188mL OMNIPAQUE IOHEXOL 350 MG/ML SOLN COMPARISON:  Plain films of earlier in the day. Abdominopelvic CT of 11/17/2018. No prior chest CT. FINDINGS: CTA CHEST FINDINGS Cardiovascular: The quality of this exam for evaluation of pulmonary embolism is moderate. Portions of exam are motion degraded. The bolus is relatively well timed. No pulmonary embolism to the large segmental level. A right Port-A-Cath with tip at mid right atrium. Aortic and branch vessel atherosclerosis. Tortuous thoracic aorta. Mild cardiomegaly, with trace pericardial fluid. Multivessel coronary artery atherosclerosis. Mediastinum/Nodes: No mediastinal or hilar adenopathy. Tiny hiatal hernia. Lungs/Pleura: Small right pleural effusion. Probable scarring in the right apex. Right middle lobe 5 mm pulmonary nodule on 82/6. Left upper lobe 4 mm nodule on 73/6. Neither of these areas were included on prior abdominal CTs. Musculoskeletal: Moderate right greater than left gynecomastia. Widespread sclerotic osseous metastasis, including throughout the right scapula, vertebral bodies, and bilateral ribs. Review of the MIP images confirms the above findings. CT ABDOMEN and PELVIS FINDINGS Hepatobiliary: Normal liver. Normal gallbladder, without biliary ductal dilatation. Pancreas: Normal, without mass or ductal dilatation. Spleen: Normal in size, without focal abnormality.  Adrenals/Urinary Tract: Normal adrenal glands. Normal kidneys, without hydronephrosis. Degraded evaluation of the pelvis, secondary to beam hardening artifact from bilateral hip arthroplasty. Grossly normal urinary bladder. Stomach/Bowel: Tiny hiatal hernia. Small amount of stool in the rectum. There is probable rectal wall thickening and surrounding edema, including on 77/11 and continuing to surround the anus on 85/11. Large amount of colonic stool more proximally. Normal terminal ileum and appendix. Normal small bowel. Vascular/Lymphatic: Aortic and branch vessel atherosclerosis. No abdominopelvic adenopathy. Previously described inguinal adenopathy is no longer identified. Reproductive: Not well evaluated. Other: No gross free pelvic fluid. No abdominal ascites. No free intraperitoneal air. Musculoskeletal: Bilateral hip arthroplasty. Grade 1-2 anterolisthesis of L4 on L5. Review of the MIP images confirms the above findings. IMPRESSION: 1. No evidence of pulmonary embolism. Please see limitations above. 2. Extensive osseous metastasis throughout the chest. 3. Bilateral pulmonary nodules, above the level of scanning on prior abdominal CTs. Indeterminate. Cannot exclude pulmonary metastasis. 4. No evidence of soft tissue metastasis within the abdomen or pelvis. Previously described inguinal adenopathy has resolved. 5. Possible constipation. Suspicion of proctitis, as evidenced by rectal wall thickening and surrounding edema. This is nonspecific, but given clinical suspicion of fecal impaction, could represent stercoral colitis. 6. Degraded evaluation of the pelvis, secondary to beam hardening artifact from bilateral hip arthroplasty. 7. Coronary artery atherosclerosis. Aortic Atherosclerosis (ICD10-I70.0). 8. Bilateral gynecomastia. Electronically Signed   By: Abigail Miyamoto M.D.   On: 08/03/2019 20:13   Dg Abdomen Acute W/chest  Result Date: 08/03/2019 CLINICAL DATA:  72 year old male with concern for  obstruction. History of prostate cancer with osseous metastasis. EXAM: DG ABDOMEN ACUTE W/ 1V CHEST COMPARISON:  Radiograph dated 06/05/2019 FINDINGS: The lungs are clear. There is no pleural effusion or pneumothorax. The cardiac silhouette is within normal limits. Atherosclerotic calcification of the aorta. Right pectoral Port-A-Cath in similar position. Large amount of stool noted throughout the colon. No bowel dilatation or evidence of obstruction. No free air or radiopaque calculi. Bilateral hip arthroplasties. Prostate fiducial markers. The soft tissues are unremarkable. Sclerotic changes of the right scapula similar to prior radiograph in keeping with metastatic disease. IMPRESSION: 1. No acute cardiopulmonary  process. 2. Constipation.  No evidence of bowel obstruction. Electronically Signed   By: Anner Crete M.D.   On: 08/03/2019 17:09    Pending Labs Unresulted Labs (From admission, onward)    Start     Ordered   08/03/19 1852  Blood Culture (routine x 2)  BLOOD CULTURE X 2,   STAT     08/03/19 1856   08/03/19 1852  Urine culture  ONCE - STAT,   STAT     08/03/19 1856   08/03/19 1749  SARS CORONAVIRUS 2 (TAT 6-24 HRS) Nasopharyngeal Nasopharyngeal Swab  (Asymptomatic/Tier 3)  Once,   STAT    Question Answer Comment  Is this test for diagnosis or screening Screening   Symptomatic for COVID-19 as defined by CDC No   Hospitalized for COVID-19 No   Admitted to ICU for COVID-19 No   Previously tested for COVID-19 Yes   Resident in a congregate (group) care setting No   Employed in healthcare setting No      08/03/19 1748   08/03/19 1612  Culture, blood (routine x 2)  BLOOD CULTURE X 2,   STAT     08/03/19 1612   Signed and Held  CBC  (enoxaparin (LOVENOX)    CrCl >/= 30 ml/min)  Once,   R    Comments: Baseline for enoxaparin therapy IF NOT ALREADY DRAWN.  Notify MD if PLT < 100 K.    Signed and Held   Signed and Held  Creatinine, serum  (enoxaparin (LOVENOX)    CrCl >/= 30 ml/min)   Once,   R    Comments: Baseline for enoxaparin therapy IF NOT ALREADY DRAWN.    Signed and Held   Signed and Held  Creatinine, serum  (enoxaparin (LOVENOX)    CrCl >/= 30 ml/min)  Weekly,   R    Comments: while on enoxaparin therapy    Signed and Held   Signed and Held  Comprehensive metabolic panel  Tomorrow morning,   R     Signed and Held   Signed and Held  CBC  Tomorrow morning,   R     Signed and Held          Vitals/Pain Today's Vitals   08/03/19 2130 08/03/19 2200 08/03/19 2230 08/03/19 2300  BP: 112/78 111/76 138/80 103/76  Pulse: (!) 107 96 (!) 111 98  Resp: (!) 21 18 (!) 25 (!) 21  Temp:      TempSrc:      SpO2: 93% 98% 100% 98%  PainSc:        Isolation Precautions No active isolations  Medications Medications  sodium chloride (PF) 0.9 % injection (has no administration in time range)  ceFEPIme (MAXIPIME) 2 g in sodium chloride 0.9 % 100 mL IVPB (has no administration in time range)  vancomycin (VANCOCIN) IVPB 1000 mg/200 mL premix (has no administration in time range)  sodium chloride 0.9 % bolus 500 mL (0 mLs Intravenous Stopped 08/03/19 1718)  iohexol (OMNIPAQUE) 350 MG/ML injection 100 mL (100 mLs Intravenous Contrast Given 08/03/19 1926)  ceFEPIme (MAXIPIME) 2 g in sodium chloride 0.9 % 100 mL IVPB (0 g Intravenous Stopped 08/03/19 2115)  metroNIDAZOLE (FLAGYL) IVPB 500 mg (0 mg Intravenous Stopped 08/03/19 2256)  vancomycin (VANCOCIN) IVPB 1000 mg/200 mL premix (0 mg Intravenous Stopped 08/03/19 2256)  acetaminophen (TYLENOL) tablet 650 mg (650 mg Oral Given 08/03/19 1953)  0.9 %  sodium chloride infusion ( Intravenous Stopped 08/03/19 2257)    Mobility walks

## 2019-08-04 NOTE — Progress Notes (Signed)
PROGRESS NOTE    Dennis Fischer  ULA:[id number]  DOB: December 06, 1945  DOA: 08/03/2019 PCP: [name], MD  Brief Narrative:  73 y.o. male with medical history significant of prostate cancer with multiple metastasis to the bone and other parts of the body, GERD, hypertension, recurrent constipation, previous GI bleed who presented with bilious vomiting noted to include feces.  Patient has taken Senokot and MiraLAX for constipation given from his oncologist 3 days ago.  His continues to have significant abdominal distention.  Patient has had 3 episode of emesis prior to coming to the hospital.  In the ER , met sepsis criterion with possible UTI ED Course: Temperature 100.7 blood pressure 139/92 pulse 138 respiratory of 25 and oxygen sat 93% on room air.  White count 5.5 hemoglobin 10.7 platelets 306. BMP wnl. Urinalysis showed cloudy urine with large leukocytes.  WBC more than 50 and rare bacteria COVID-19 -ve.   Lactic acid 1.8.  CT chest without contrast and CT abdomen and pelvis showed stercoral colitis with proctitis.   Subjective:  Patient had couple of bowel movements after GoLYTELY.  Nurse tech getting ready to clean him.  Passing semiformed stool.  States nausea somewhat improved.  Objective: Vitals:   08/03/19 2300 08/04/19 0024 08/04/19 0132 08/04/19 0507  BP: 103/76 (!) 129/91  108/74  Pulse: 98 (!) 106  (!) 104  Resp: (!) _0 Temp:  98.4 F (36.9 C)  98.2 F (36.8 C)  TempSrc:      SpO2: 98% 100%  99%  Height:   6' (1.829 m)     Intake/Output Summary (Last 24 hours) at 08/04/2019 0850 Last data filed at 08/04/2019 0557 Gross per 24 hour  Intake 787.52 ml  Output 400 ml  Net 387.52 ml   There were no vitals filed for this visit.  Physical Examination:  General exam: Appears calm and comfortable  Respiratory system: Clear to auscultation. Respiratory effort normal. Cardiovascular system: S1 & S2 heard, RRR. No JVD, murmurs, rubs, gallops or clicks. No pedal  edema. Gastrointestinal system: Abdomen is nondistended, soft and nontender. No organomegaly or masses felt. Normal bowel sounds heard. Central nervous system: Alert and oriented. No new focal neurological deficits. Extremities: No contractures, edema or joint deformities.  Skin: No rashes, lesions or ulcers Psychiatry: Judgement and insight appear normal. Mood & affect appropriate.   Data Reviewed: I have personally reviewed following labs and imaging studies  CBC: Recent Labs  Lab 08/03/19 1215 08/04/19 0556  WBC 5.5 4.2  HGB 11.7* 8.8*  HCT 36.7* 27.6*  MCV 92.7 94.8  PLT 306 212   Basic Metabolic Panel: Recent Labs  Lab 08/03/19 1215 08/04/19 0556  NA 138 140  K 3.7 3.2*  CL 104 111  CO2 22 23  GLUCOSE 148* 131*  BUN 13 11  CREATININE 0.96 0.81  CALCIUM 9.1 8.2*   GFR: Estimated Creatinine Clearance: 89.9 mL/min (by C-G formula based on SCr of 0.81 mg/dL). Liver Function Tests: Recent Labs  Lab 08/03/19 1215 08/04/19 0556  AST 16 13*  ALT 13 11  ALKPHOS 65 47  BILITOT 0.5 0.7  PROT 7.5 6.0*  ALBUMIN 3.9 3.1*   Recent Labs  Lab 08/03/19 1215  LIPASE 20   No results for input(s): AMMONIA in the last 168 hours. Coagulation Profile: Recent Labs  Lab 08/03/19 1619  INR 1.2   Cardiac Enzymes: No results for input(s): CKTOTAL, CKMB, CKMBINDEX, TROPONINI in the last 168 hours. BNP (last 3 results)  No results for input(s): PROBNP in the last 8760 hours. HbA1C: No results for input(s): HGBA1C in the last 72 hours. CBG: No results for input(s): GLUCAP in the last 168 hours. Lipid Profile: No results for input(s): CHOL, HDL, LDLCALC, TRIG, CHOLHDL, LDLDIRECT in the last 72 hours. Thyroid Function Tests: No results for input(s): TSH, T4TOTAL, FREET4, T3FREE, THYROIDAB in the last 72 hours. Anemia Panel: No results for input(s): VITAMINB12, FOLATE, FERRITIN, TIBC, IRON, RETICCTPCT in the last 72 hours. Sepsis Labs: Recent Labs  Lab 08/03/19 1619  08/03/19 1813  LATICACIDVEN 1.8 1.2    Recent Results (from the past 240 hour(s))  SARS CORONAVIRUS 2 (TAT 6-24 HRS) Nasopharyngeal Nasopharyngeal Swab     Status: None   Collection Time: 08/03/19  5:49 PM   Specimen: Nasopharyngeal Swab  Result Value Ref Range Status   SARS Coronavirus 2 NEGATIVE NEGATIVE Final    Comment: (NOTE) SARS-CoV-2 target nucleic acids are NOT DETECTED. The SARS-CoV-2 RNA is generally detectable in upper and lower respiratory specimens during the acute phase of infection. Negative results do not preclude SARS-CoV-2 infection, do not rule out co-infections with other pathogens, and should not be used as the sole basis for treatment or other patient management decisions. Negative results must be combined with clinical observations, patient history, and epidemiological information. The expected result is Negative. Fact Sheet for Patients: SugarRoll.be Fact Sheet for Healthcare Providers: https://www.woods-mathews.com/ This test is not yet approved or cleared by the [place] FDA and  has been authorized for detection and/or diagnosis of SARS-CoV-2 by FDA under an Emergency Use Authorization (EUA). This EUA will remain  in effect (meaning this test can be used) for the duration of the COVID-19 declaration under Section 56 4(b)(1) of the Act, 21 U.S.C. section 360bbb-3(b)(1), unless the authorization is terminated or revoked sooner. Performed at [facility], [place] [address] [place], [place] [address]       Radiology Studies: Ct Angio Chest Pe W/cm &/or Wo Cm  Result Date: 08/03/2019 CLINICAL DATA:  Constipation for 1 week with nausea and vomiting. Reportedly vomiting feces. Rule out impaction. History of prostate cancer. EXAM: CT ANGIOGRAPHY CHEST CT ABDOMEN AND PELVIS WITH CONTRAST TECHNIQUE: Multidetector CT imaging of the chest was performed using the standard protocol during bolus administration of  intravenous contrast. Multiplanar CT image reconstructions and MIPs were obtained to evaluate the vascular anatomy. Multidetector CT imaging of the abdomen and pelvis was performed using the standard protocol during bolus administration of intravenous contrast. CONTRAST:  159m OMNIPAQUE IOHEXOL 350 MG/ML SOLN COMPARISON:  Plain films of earlier in the day. Abdominopelvic CT of 11/17/2018. No prior chest CT. FINDINGS: CTA CHEST FINDINGS Cardiovascular: The quality of this exam for evaluation of pulmonary embolism is moderate. Portions of exam are motion degraded. The bolus is relatively well timed. No pulmonary embolism to the large segmental level. A right Port-A-Cath with tip at mid right atrium. Aortic and branch vessel atherosclerosis. Tortuous thoracic aorta. Mild cardiomegaly, with trace pericardial fluid. Multivessel coronary artery atherosclerosis. Mediastinum/Nodes: No mediastinal or hilar adenopathy. Tiny hiatal hernia. Lungs/Pleura: Small right pleural effusion. Probable scarring in the right apex. Right middle lobe 5 mm pulmonary nodule on 82/6. Left upper lobe 4 mm nodule on 73/6. Neither of these areas were included on prior abdominal CTs. Musculoskeletal: Moderate right greater than left gynecomastia. Widespread sclerotic osseous metastasis, including throughout the right scapula, vertebral bodies, and bilateral ribs. Review of the MIP images confirms the above findings. CT ABDOMEN and PELVIS FINDINGS  Hepatobiliary: Normal liver. Normal gallbladder, without biliary ductal dilatation. Pancreas: Normal, without mass or ductal dilatation. Spleen: Normal in size, without focal abnormality. Adrenals/Urinary Tract: Normal adrenal glands. Normal kidneys, without hydronephrosis. Degraded evaluation of the pelvis, secondary to beam hardening artifact from bilateral hip arthroplasty. Grossly normal urinary bladder. Stomach/Bowel: Tiny hiatal hernia. Small amount of stool in the rectum. There is probable rectal  wall thickening and surrounding edema, including on 77/11 and continuing to surround the anus on 85/11. Large amount of colonic stool more proximally. Normal terminal ileum and appendix. Normal small bowel. Vascular/Lymphatic: Aortic and branch vessel atherosclerosis. No abdominopelvic adenopathy. Previously described inguinal adenopathy is no longer identified. Reproductive: Not well evaluated. Other: No gross free pelvic fluid. No abdominal ascites. No free intraperitoneal air. Musculoskeletal: Bilateral hip arthroplasty. Grade 1-2 anterolisthesis of L4 on L5. Review of the MIP images confirms the above findings. IMPRESSION: 1. No evidence of pulmonary embolism. Please see limitations above. 2. Extensive osseous metastasis throughout the chest. 3. Bilateral pulmonary nodules, above the level of scanning on prior abdominal CTs. Indeterminate. Cannot exclude pulmonary metastasis. 4. No evidence of soft tissue metastasis within the abdomen or pelvis. Previously described inguinal adenopathy has resolved. 5. Possible constipation. Suspicion of proctitis, as evidenced by rectal wall thickening and surrounding edema. This is nonspecific, but given clinical suspicion of fecal impaction, could represent stercoral colitis. 6. Degraded evaluation of the pelvis, secondary to beam hardening artifact from bilateral hip arthroplasty. 7. Coronary artery atherosclerosis. Aortic Atherosclerosis (ICD10-I70.0). 8. Bilateral gynecomastia. Electronically Signed   By: Abigail Miyamoto M.D.   On: 08/03/2019 20:13   Ct Abdomen Pelvis W Contrast  Result Date: 08/03/2019 CLINICAL DATA:  Constipation for 1 week with nausea and vomiting. Reportedly vomiting feces. Rule out impaction. History of prostate cancer. EXAM: CT ANGIOGRAPHY CHEST CT ABDOMEN AND PELVIS WITH CONTRAST TECHNIQUE: Multidetector CT imaging of the chest was performed using the standard protocol during bolus administration of intravenous contrast. Multiplanar CT image  reconstructions and MIPs were obtained to evaluate the vascular anatomy. Multidetector CT imaging of the abdomen and pelvis was performed using the standard protocol during bolus administration of intravenous contrast. CONTRAST:  133m OMNIPAQUE IOHEXOL 350 MG/ML SOLN COMPARISON:  Plain films of earlier in the day. Abdominopelvic CT of 11/17/2018. No prior chest CT. FINDINGS: CTA CHEST FINDINGS Cardiovascular: The quality of this exam for evaluation of pulmonary embolism is moderate. Portions of exam are motion degraded. The bolus is relatively well timed. No pulmonary embolism to the large segmental level. A right Port-A-Cath with tip at mid right atrium. Aortic and branch vessel atherosclerosis. Tortuous thoracic aorta. Mild cardiomegaly, with trace pericardial fluid. Multivessel coronary artery atherosclerosis. Mediastinum/Nodes: No mediastinal or hilar adenopathy. Tiny hiatal hernia. Lungs/Pleura: Small right pleural effusion. Probable scarring in the right apex. Right middle lobe 5 mm pulmonary nodule on 82/6. Left upper lobe 4 mm nodule on 73/6. Neither of these areas were included on prior abdominal CTs. Musculoskeletal: Moderate right greater than left gynecomastia. Widespread sclerotic osseous metastasis, including throughout the right scapula, vertebral bodies, and bilateral ribs. Review of the MIP images confirms the above findings. CT ABDOMEN and PELVIS FINDINGS Hepatobiliary: Normal liver. Normal gallbladder, without biliary ductal dilatation. Pancreas: Normal, without mass or ductal dilatation. Spleen: Normal in size, without focal abnormality. Adrenals/Urinary Tract: Normal adrenal glands. Normal kidneys, without hydronephrosis. Degraded evaluation of the pelvis, secondary to beam hardening artifact from bilateral hip arthroplasty. Grossly normal urinary bladder. Stomach/Bowel: Tiny hiatal hernia. Small amount of stool in the rectum.  There is probable rectal wall thickening and surrounding edema,  including on 77/11 and continuing to surround the anus on 85/11. Large amount of colonic stool more proximally. Normal terminal ileum and appendix. Normal small bowel. Vascular/Lymphatic: Aortic and branch vessel atherosclerosis. No abdominopelvic adenopathy. Previously described inguinal adenopathy is no longer identified. Reproductive: Not well evaluated. Other: No gross free pelvic fluid. No abdominal ascites. No free intraperitoneal air. Musculoskeletal: Bilateral hip arthroplasty. Grade 1-2 anterolisthesis of L4 on L5. Review of the MIP images confirms the above findings. IMPRESSION: 1. No evidence of pulmonary embolism. Please see limitations above. 2. Extensive osseous metastasis throughout the chest. 3. Bilateral pulmonary nodules, above the level of scanning on prior abdominal CTs. Indeterminate. Cannot exclude pulmonary metastasis. 4. No evidence of soft tissue metastasis within the abdomen or pelvis. Previously described inguinal adenopathy has resolved. 5. Possible constipation. Suspicion of proctitis, as evidenced by rectal wall thickening and surrounding edema. This is nonspecific, but given clinical suspicion of fecal impaction, could represent stercoral colitis. 6. Degraded evaluation of the pelvis, secondary to beam hardening artifact from bilateral hip arthroplasty. 7. Coronary artery atherosclerosis. Aortic Atherosclerosis (ICD10-I70.0). 8. Bilateral gynecomastia. Electronically Signed   By: Abigail Miyamoto M.D.   On: 08/03/2019 20:13   Dg Abdomen Acute W/chest  Result Date: 08/03/2019 CLINICAL DATA:  73 year old male with concern for obstruction. History of prostate cancer with osseous metastasis. EXAM: DG ABDOMEN ACUTE W/ 1V CHEST COMPARISON:  Radiograph dated 06/05/2019 FINDINGS: The lungs are clear. There is no pleural effusion or pneumothorax. The cardiac silhouette is within normal limits. Atherosclerotic calcification of the aorta. Right pectoral Port-A-Cath in similar position. Large  amount of stool noted throughout the colon. No bowel dilatation or evidence of obstruction. No free air or radiopaque calculi. Bilateral hip arthroplasties. Prostate fiducial markers. The soft tissues are unremarkable. Sclerotic changes of the right scapula similar to prior radiograph in keeping with metastatic disease. IMPRESSION: 1. No acute cardiopulmonary process. 2. Constipation.  No evidence of bowel obstruction. Electronically Signed   By: Anner Crete M.D.   On: 08/03/2019 17:09        Scheduled Meds: . amLODipine  10 mg Oral Daily  . calcium-vitamin D  1 tablet Oral Q breakfast  . carvedilol  12.5 mg Oral BID WC  . Chlorhexidine Gluconate Cloth  6 each Topical Daily  . enoxaparin (LOVENOX) injection  40 mg Subcutaneous Daily  . gabapentin  300 mg Oral Daily  . multivitamin with minerals  1 tablet Oral Daily  . pantoprazole  40 mg Oral Daily  . simvastatin  20 mg Oral q1800   Continuous Infusions: . ceFEPime (MAXIPIME) IV    . dextrose 5 % and 0.9% NaCl 125 mL/hr at 08/04/19 0046  . vancomycin      Assessment & Plan:   #1 Sepsis syndrome POA: Patient admitted with broad spectrum antibiotics-Vanco and cefepime.  Urine and blood cultures sent--pending results (no growth reported so far).  De-escalate abx per cultures results--suspected source to be UTI/proctitis/colitis.Will d/c vancomycin and add anaerobic coverage for colitis/proctitis-Flagyl.  #2  Fecal impaction/ stercoral colitis: Patient being aggressively treated for his constipation.  He tried regular laxatives at home with inadequate results. Presenting symptoms of fecal vomiting likely secondary to backing up, clean from below needed. Undergoing Golytely treatment--can add enema if inadequate results and patient able to tolerate--may need manual disimpaction if repeat abdominal x-ray in a.m. shows persistent impaction.  Patient on IV fluids,IV antibiotics which will cover his proctitis. Hold calcium supplements  which  could be contributing to constipation  #3 Essential hypertension: Resume home regimen  #4 Metastatic prostate cancer: Continue home regimen and follow oncology. Several bomy lesions and possible pulmonary nodules representing mets reported on CT  #5 H/O GI bleed: watch for recurrence in the setting of #2. Hgb 11-->8.2 could be dilutional, watch closely  # 6 GERD: on PPI daily  #7 HTN: Continue Coreg/Norvasc  # 8 Hyperlipidemia: Continue statins  #9. Hypokalemia: replace IV   DVT prophylaxis: lovenox Code Status: DNR Family / Patient Communication: d/w patient Disposition Plan: home when medically cleared     LOS: 1 day    Time spent: 35 minutes    Guilford Shi, MD [facility] Pager [phone]  If 7PM-7AM, please contact night-coverage [email or web address] Password [email or web address] 08/04/2019, 8:50 AM

## 2019-08-04 NOTE — Progress Notes (Signed)
Patient has large formed stool, incontinent episode. Golytely at bedside, administered. Patient educated on medication. Patient dozing in and out during medication. Consumed 2 cups only. Will continue to monitor.

## 2019-08-04 NOTE — Discharge Instructions (Signed)
Tips for Adding Fiber to Your Eating Plan   You may choose any foods, but try to find foods with whole grains.  Slowly increase the amount of fiber you eat to 25 to 35 grams per day. Increase by 5g a day week by week.  Eat whole grain breads and cereals. Look for choices with 100% whole wheat, rye, oats, or bran as the first or second ingredient.   Have brown or wild rice instead of white rice or potatoes.   Enjoy a variety of grains. Good choices include barley, oats, farro, kamut, and quinoa.   Bake with whole wheat flour. You can use it to replace some white or all-purpose flour in recipes.  Enjoy baked beans more often! Add dried beans and peas to casseroles or soups.   Choose fresh fruit and vegetables instead of juices.   Eat fruits and vegetables with peels or skins on.   Compare food labels of similar foods to find higher-fiber choices. Packaged foods have the amount of fiber per serving listed on the Nutrition Facts label.  Drink plenty of fluids. Set a goal of at least 8 cups per day. You may need even more with higher amounts of fiber. Fluid helps your body process fiber without discomfort.   If you are taking calcium or iron supplements, check with your doctor or registered dietitian. You may be able to take smaller amounts several times a day.   Check Nutrition Facts labels and choose products with 4 grams dietary fiber or more per serving.  Grains:  cup high-fiber cereal. Dried Beans and Peas: cup cooked red beans, kidney beans, large lima beans, navy beans, pinto beans, white beans, lentils, or black-eyed peas Vegetables:1 artichoke (cooked) Fruits:  cup blackberries or raspberries 4 prunes (dried)  Copyright Academy of Nutrition and Dietetics

## 2019-08-05 ENCOUNTER — Ambulatory Visit: Payer: Medicare Other | Admitting: Physical Therapy

## 2019-08-05 DIAGNOSIS — E876 Hypokalemia: Secondary | ICD-10-CM

## 2019-08-05 LAB — CBC
HCT: 28.1 % — ABNORMAL LOW (ref 39.0–52.0)
Hemoglobin: 9 g/dL — ABNORMAL LOW (ref 13.0–17.0)
MCH: 30.3 pg (ref 26.0–34.0)
MCHC: 32 g/dL (ref 30.0–36.0)
MCV: 94.6 fL (ref 80.0–100.0)
Platelets: 211 10*3/uL (ref 150–400)
RBC: 2.97 MIL/uL — ABNORMAL LOW (ref 4.22–5.81)
RDW: 18.3 % — ABNORMAL HIGH (ref 11.5–15.5)
WBC: 4.5 10*3/uL (ref 4.0–10.5)
nRBC: 0 % (ref 0.0–0.2)

## 2019-08-05 LAB — BASIC METABOLIC PANEL
Anion gap: 8 (ref 5–15)
BUN: 6 mg/dL — ABNORMAL LOW (ref 8–23)
CO2: 22 mmol/L (ref 22–32)
Calcium: 8.1 mg/dL — ABNORMAL LOW (ref 8.9–10.3)
Chloride: 109 mmol/L (ref 98–111)
Creatinine, Ser: 0.65 mg/dL (ref 0.61–1.24)
GFR calc Af Amer: >60 mL/min (ref >60)
GFR calc non Af Amer: >60 mL/min (ref >60)
Glucose, Bld: 110 mg/dL — ABNORMAL HIGH (ref 70–99)
Potassium: 2.9 mmol/L — ABNORMAL LOW (ref 3.5–5.1)
Sodium: 139 mmol/L (ref 135–145)

## 2019-08-05 LAB — URINE CULTURE: Culture: >=100000 — AB

## 2019-08-05 MED ORDER — POTASSIUM CHLORIDE CRYS ER 20 MEQ PO TBCR
40.0000 meq | EXTENDED_RELEASE_TABLET | Freq: Once | ORAL | Status: AC
  Administered 2019-08-05: 40 meq via ORAL
  Filled 2019-08-05: qty 2

## 2019-08-05 MED ORDER — CEPHALEXIN 500 MG PO CAPS: 500.0000 mg | ORAL_CAPSULE | Freq: Three times a day (TID) | ORAL | 0 refills | Status: AC

## 2019-08-05 MED ORDER — METRONIDAZOLE 500 MG PO TABS: 500.0000 mg | ORAL_TABLET | Freq: Three times a day (TID) | ORAL | 0 refills | Status: AC

## 2019-08-05 NOTE — Care Management Important Message (Signed)
Important Message  Patient Details IM Letter given to [facility] SW to present to the Patient Name: Dennis Fischer MRN: [id number] Date of Birth: Dec 24, 1945   Medicare Important Message Given:  Yes     Kerin Salen 08/05/2019, 11:12 AM

## 2019-08-05 NOTE — Progress Notes (Addendum)
PROGRESS NOTE  Dennis Fischer [id number] DOB: October 27, 1945 DOA: 08/03/2019 PCP: [name], MD  HPI/Recap of past 24 hours:  He remains on clear liquid and IV hydration, report is  improving though not back to baseline He denies pain ,currently no fever, no nausea no vomiting, he is having bowel movement He wants diet to be advanced  Assessment/Plan: Principal Problem:   Sepsis ([place]) Active Problems:   Prostate cancer [facility])   Essential hypertension   Constipation   Noninfectious colitis  Sepsis presented on admission, with fever, tachycardia and tachypnea. -Source of infection including UTI, possible proctitis and stercoral colitis in the setting of severe constipation. -Blood culture no growth to date -Urine culture grew Klebsiella pneumoniae -COVID-19 screening negative -We will continue 1 more day of Rocephin and IV Flagyl with plan to change oral antibiotic (keflex and flagyl) tomorrow if he is clinically continued to improve, case discussed with pharmacy.  Nausea vomiting , likely due to sepsis and severe constipation -Sepsis is improving, constipation resolved -DC IV fluids ,advance diet as tolerated  Hypokalemia -Keep on telemetry, replace potassium, check magnesium  History of GI bleed Currently denies blood in the stool, will monitor hemoglobin  Essential hypertension -Hold Norvasc for now, continue Coreg  Metastatic prostate cancer With bony lesions and possible for nodules on CT scan He is to continue to follow with oncology Dr. Alen Blew   Failure to thrive, he reports use wheelchair at baseline.  He lives with his wife at home Will get physical therapy, likely will need home health.  DVT Prophylaxis: subQ lovenox  Code Status: DNR  Family Communication: patient   Disposition Plan: will need to keep on tele, remain hypokalemia, will need to advance diet and getting physical therapy, will continue iv abx for one more day, If lytes improves, able  to tolerate diet advancement, hopefully able to discharge early tomorrow morning, it is his birthday tomorrow, he really wants to go home.   Consultants:  Case manager, physical therapy  Procedures:  none  Antibiotics:  Iv rocephin   Objective: BP 95/75 (BP Location: Left Arm)   Pulse 93   Temp 98.6 F (37 C) (Oral)   Resp 17   Ht 6' (1.829 m)   SpO2 100%   BMI 23.06 kg/m   Intake/Output Summary (Last 24 hours) at 08/05/2019 1110 Last data filed at 08/05/2019 0805 Gross per 24 hour  Intake 1439.09 ml  Output 2200 ml  Net -760.91 ml   There were no vitals filed for this visit.  Exam: Patient is examined daily including today on 08/05/2019, exams remain the same as of yesterday except that has changed    General:  NAD  Cardiovascular: RRR  Respiratory: CTABL  Abdomen: Soft/ND/NT, positive BS  Musculoskeletal: No Edema  Neuro: alert, oriented   Data Reviewed: Basic Metabolic Panel: Recent Labs  Lab 08/03/19 1215 08/04/19 0556 08/05/19 0401  NA 138 140 139  K 3.7 3.2* 2.9*  CL 104 111 109  CO2 22 23 22   GLUCOSE 148* 131* 110*  BUN 13 11 6*  CREATININE 0.96 0.81 0.65  CALCIUM 9.1 8.2* 8.1*   Liver Function Tests: Recent Labs  Lab 08/03/19 1215 08/04/19 0556  AST 16 13*  ALT 13 11  ALKPHOS 65 47  BILITOT 0.5 0.7  PROT 7.5 6.0*  ALBUMIN 3.9 3.1*   Recent Labs  Lab 08/03/19 1215  LIPASE 20   No results for input(s): AMMONIA in the last 168 hours. CBC: Recent Labs  Lab 08/03/19 1215 08/04/19 0556 08/05/19 0401  WBC 5.5 4.2 4.5  HGB 11.7* 8.8* 9.0*  HCT 36.7* 27.6* 28.1*  MCV 92.7 94.8 94.6  PLT 306 198 211   Cardiac Enzymes:   No results for input(s): CKTOTAL, CKMB, CKMBINDEX, TROPONINI in the last 168 hours. BNP (last 3 results) No results for input(s): BNP in the last 8760 hours.  ProBNP (last 3 results) No results for input(s): PROBNP in the last 8760 hours.  CBG: No results for input(s): GLUCAP in the last 168  hours.  Recent Results (from the past 240 hour(s))  Urine culture     Status: Abnormal   Collection Time: 08/03/19  5:23 PM   Specimen: In/Out Cath Urine  Result Value Ref Range Status   Specimen Description   Final    IN/OUT CATH URINE Performed at [facility] [address], [place], [place] [address]    Special Requests   Final    NONE Performed at [facility], [place] [address], [place], [place] [address]    Culture >=100,000 COLONIES/mL KLEBSIELLA PNEUMONIAE (A)  Final   Report Status 08/05/2019 FINAL  Final   Organism ID, Bacteria KLEBSIELLA PNEUMONIAE (A)  Final      Susceptibility   Klebsiella pneumoniae - MIC*    AMPICILLIN >=32 RESISTANT Resistant     CEFAZOLIN <=4 SENSITIVE Sensitive     CEFTRIAXONE <=1 SENSITIVE Sensitive     CIPROFLOXACIN <=0.25 SENSITIVE Sensitive     GENTAMICIN <=1 SENSITIVE Sensitive     IMIPENEM 0.5 SENSITIVE Sensitive     NITROFURANTOIN 128 RESISTANT Resistant     TRIMETH/SULFA <=20 SENSITIVE Sensitive     AMPICILLIN/SULBACTAM 4 SENSITIVE Sensitive     PIP/TAZO <=4 SENSITIVE Sensitive     Extended ESBL NEGATIVE Sensitive     * >=100,000 COLONIES/mL KLEBSIELLA PNEUMONIAE  Culture, blood (routine x 2)     Status: None (Preliminary result)   Collection Time: 08/03/19  5:34 PM   Specimen: BLOOD LEFT HAND  Result Value Ref Range Status   Specimen Description   Final    BLOOD LEFT HAND Performed at [facility] [address] [place], [place] [address]    Special Requests   Final    BOTTLES DRAWN AEROBIC ONLY Blood Culture adequate volume Performed at [facility] [address], [place], [place] [address]    Culture   Final    NO GROWTH 2 DAYS Performed at [facility] [address] [place], [place] [address]    Report Status PENDING  Incomplete  Culture, blood (routine x 2)     Status: None (Preliminary result)   Collection Time: 08/03/19  5:45 PM    Specimen: BLOOD RIGHT HAND  Result Value Ref Range Status   Specimen Description   Final    BLOOD RIGHT HAND Performed at [facility] [address] [place], [place] [address]    Special Requests   Final    BOTTLES DRAWN AEROBIC ONLY Blood Culture adequate volume Performed at [facility] [address], [place], [place] [address]    Culture   Final    NO GROWTH 2 DAYS Performed at [facility] [address] [place], [place] [address]    Report Status PENDING  Incomplete  SARS CORONAVIRUS 2 (TAT 6-24 HRS) Nasopharyngeal Nasopharyngeal Swab     Status: None   Collection Time: 08/03/19  5:49 PM   Specimen:  Nasopharyngeal Swab  Result Value Ref Range Status   SARS Coronavirus 2 NEGATIVE NEGATIVE Final    Comment: (NOTE) SARS-CoV-2 target nucleic acids are NOT DETECTED. The SARS-CoV-2 RNA is generally detectable in upper and lower respiratory specimens during the acute phase of infection. Negative results do not preclude SARS-CoV-2 infection, do not rule out co-infections with other pathogens, and should not be used as the sole basis for treatment or other patient management decisions. Negative results must be combined with clinical observations, patient history, and epidemiological information. The expected result is Negative. Fact Sheet for Patients: SugarRoll.be Fact Sheet for Healthcare Providers: https://www.woods-mathews.com/ This test is not yet approved or cleared by the [place] FDA and  has been authorized for detection and/or diagnosis of SARS-CoV-2 by FDA under an Emergency Use Authorization (EUA). This EUA will remain  in effect (meaning this test can be used) for the duration of the COVID-19 declaration under Section 56 4(b)(1) of the Act, 21 U.S.C. section 360bbb-3(b)(1), unless the authorization is terminated or revoked sooner. Performed at [facility],  [place] [address] [place], [place] [address]   Blood Culture (routine x 2)     Status: None (Preliminary result)   Collection Time: 08/04/19 12:41 AM   Specimen: BLOOD  Result Value Ref Range Status   Specimen Description   Final    BLOOD BLOOD RIGHT HAND Performed at [facility] [address], [place], [place] [address]    Special Requests   Final    BOTTLES DRAWN AEROBIC ONLY Blood Culture adequate volume Performed at [facility] [address] [place], [place] [address]    Culture   Final    NO GROWTH 1 DAY Performed at [facility], [place] [address], [place], [place] [address]    Report Status PENDING  Incomplete  Blood Culture (routine x 2)     Status: None (Preliminary result)   Collection Time: 08/04/19 12:41 AM   Specimen: BLOOD  Result Value Ref Range Status   Specimen Description   Final    BLOOD BLOOD RIGHT HAND Performed at [facility] [address] [place], [place] [address]    Special Requests   Final    BOTTLES DRAWN AEROBIC ONLY Blood Culture adequate volume Performed at [facility] [address] [place], [place] [address]    Culture   Final    NO GROWTH 1 DAY Performed at [facility], [place] [address] [place],  [address]    Report Status PENDING  Incomplete     Studies: No results found.  Scheduled Meds: . carvedilol  12.5 mg Oral BID WC  . Chlorhexidine Gluconate Cloth  6 each Topical Daily  . enoxaparin (LOVENOX) injection  40 mg Subcutaneous Daily  . feeding supplement  1 Container Oral TID BM  . gabapentin  300 mg Oral Daily  . multivitamin with minerals  1 tablet Oral Daily  . pantoprazole  40 mg Oral Daily  . potassium chloride  40 mEq Oral Once  . simvastatin  20 mg Oral q1800    Continuous Infusions: . cefTRIAXone (ROCEPHIN)  IV 2 g (08/04/19 1558)  . metronidazole 500 mg (08/05/19 1021)     Time spent: 34mins I have personally  reviewed and interpreted on  08/05/2019 daily labs, tele strips, imagings as discussed above under date review session and assessment and plans.  I reviewed all nursing notes, pharmacy notes,  vitals, pertinent old  records  I have discussed plan of care as described above with RN , patient  on 08/05/2019   Florencia Reasons MD, PhD, FACP  [facility]  If 7PM-7AM, please contact night-coverage at [email or web address], password [facility] 08/05/2019, 11:10 AM  LOS: 2 days

## 2019-08-05 NOTE — TOC Initial Note (Signed)
Transition of Care [facility]) - Initial/Assessment Note    Patient Details  Name: Dennis Fischer MRN: [id number] Date of Birth: Dec 20, 1945  Transition of Care [facility]) CM/SW Contact:    Trish Mage, LCSW Phone Number: 08/05/2019, 12:10 PM  Clinical Narrative:    Patient seen due to high risk of readmission, and in response to CM consult for possible DME needs.  [name] has a comprehensive list of equipment and services, including walker, wheelchair, [facility] and shower chair, as well as hospital bed.  Furthermore, he has the support of his wife in the home, as well as a care giver through the [place] who is present 5 days a week, 3 hours a day, which he states is sufficient.  He is able to get to all medical appointments, and states his medications have all been affordable to this point.  [name] has been going to [facility] weekly for  PT, and plans to continue upon d/c for same.  No needs identified. TOC will continue to follow during the course of hospitalization.                Expected Discharge Plan: Home/Self Care Barriers to Discharge: No Barriers Identified   Patient Goals and CMS Choice Patient states their goals for this hospitalization and ongoing recovery are:: "Tomorrow is my birthday, and I want to be home."      Expected Discharge Plan and Services Expected Discharge Plan: Home/Self Care   Discharge Planning Services: CM Consult   Living arrangements for the past 2 months: Single Family Home                                      Prior Living Arrangements/Services Living arrangements for the past 2 months: Single Family Home Lives with:: Spouse Patient language and need for interpreter reviewed:: Yes Do you feel safe going back to the place where you live?: Yes      Need for Family Participation in Patient Care: Yes (Comment) Care giver support system in place?: Yes (comment) Current home services: DME, Homehealth aide Criminal Activity/Legal Involvement  Pertinent to Current Situation/Hospitalization: No - Comment as needed  Activities of Daily Living Home Assistive Devices/Equipment: Wheelchair, Hospital bed, Dentures (specify type), Eyeglasses ADL Screening (condition at time of admission) Patient's cognitive ability adequate to safely complete daily activities?: Yes Is the patient deaf or have difficulty hearing?: No Does the patient have difficulty seeing, even when wearing glasses/contacts?: Yes Does the patient have difficulty concentrating, remembering, or making decisions?: Yes Patient able to express need for assistance with ADLs?: Yes Does the patient have difficulty dressing or bathing?: Yes Independently performs ADLs?: No Does the patient have difficulty walking or climbing stairs?: Yes Weakness of Legs: Both Weakness of Arms/Hands: Both  Permission Sought/Granted                  Emotional Assessment Appearance:: Appears stated age Attitude/Demeanor/Rapport: Engaged Affect (typically observed): Appropriate Orientation: : Oriented to Self, Oriented to Place, Oriented to  Time, Oriented to Situation Alcohol / Substance Use: Not Applicable Psych Involvement: No (comment)  Admission diagnosis:  Proctitis [K62.89] Constipation, unspecified constipation type [K59.00] Sepsis without acute organ dysfunction, due to unspecified organism [facility]) [A41.9] Patient Active Problem List   Diagnosis Date Noted  . Sepsis ([place]) 08/03/2019  . Noninfectious colitis 08/03/2019  . Constipation 05/15/2019  . Elevated BUN   . Transaminitis   .  Hypoalbuminemia due to protein-calorie malnutrition ([place])   . Steroid-induced hyperglycemia   . Essential hypertension   . Metastatic cancer to spine ([place]) 03/11/2019  . Neuropathic pain   . Benign essential HTN   . Cocaine use 03/10/2019  . Cord compression myelopathy ([place])   . Spinal cord compression ([place]) 03/09/2019  . Prostate cancer metastatic to bone ([place]) 12/22/2018  . Port-A-Cath  in place 07/02/2018  . Goals of care, counseling/discussion 01/20/2018  . Right knee pain 01/17/2018  . Prostate cancer ([place]) 01/22/2017  . Prediabetes 03/30/2016  . Positive FIT (fecal immunochemical test) 11/22/2015  . GERD (gastroesophageal reflux disease) 12/21/2014  . Age-related nuclear cataract of both eyes 01/13/2013  . Hypertensive retinopathy 01/13/2013  . Essential (primary) hypertension 11/25/2012  . Pure hypercholesterolemia 11/25/2012  . History of total hip replacement 08/28/2010  . Tobacco dependence syndrome 03/22/2006   PCP:  [name], MD Pharmacy:   [facility] [place], [place] AT [address] [address] Phone: [phone] Fax: [phone]     Social Determinants of Health (SDOH) Interventions    Readmission Risk Interventions No flowsheet data found.

## 2019-08-05 NOTE — Evaluation (Signed)
Physical Therapy Evaluation Patient Details Name: Dennis Fischer MRN: [id number] DOB: [birth year] Today's Date: 08/05/2019   History of Present Illness  73 y.o. male with medical history significant of prostate cancer with multiple metastasis to the bone and other parts of the body, GERD, hypertension, recurrent constipation, previous GI bleed, T7-9 decompression on 03/09/19 due to epidural tumor, and admitted for Sepsis presented on admission, with fever, tachycardia and tachypnea and nausea, vomiting , likely due to sepsis and severe constipation  Clinical Impression  Pt admitted with above diagnosis.  Pt currently with functional limitations due to the deficits listed below (see PT Problem List). Pt will benefit from skilled PT to increase their independence and safety with mobility to allow discharge to the venue listed below.   Pt agreeable to ambulate however having loose BM with standing and transfers.  Nurse tech in to assist with changing linen and hygiene.  Pt assisted from [facility] over to recliner.  Pt reports his mobility is near his baseline and spouse reports assisting at home with gait belt.  Pt reports going to OP PT (notes from neuro OP PT in chart review) however spouse states "people are coming to the house."  Pt may benefit from [place] prior to return to OP PT if pt and spouse agreeable.  Pt has w/c which he typically uses at baseline however states "walking some" with therapy.  Pt very unsteady with standing and requires UE support.  Pt eager to d/c home tomorrow for his birthday.     Follow Up Recommendations Home health PT;Supervision/Assistance - 24 hour    Equipment Recommendations  None recommended by PT    Recommendations for Other Services       Precautions / Restrictions Precautions Precautions: Fall      Mobility  Bed Mobility Overal bed mobility: Needs Assistance Bed Mobility: Supine to Sit     Supine to sit: Min assist     General bed mobility comments:  assist for trunk upright  Transfers Overall transfer level: Needs assistance Equipment used: Rolling walker (2 wheeled) Transfers: Sit to/from [facility] Sit to Stand: Min assist;From elevated surface Stand pivot transfers: Min assist       General transfer comment: verbal cues for technique, assist to rise and steady; steadying assist with pivot to BSC (pt with loose BM upon standing)  Ambulation/Gait Ambulation/Gait assistance: Min assist Gait Distance (Feet): 3 Feet Assistive device: Rolling walker (2 wheeled) Gait Pattern/deviations: Step-through pattern;Decreased stride length;Narrow base of support;Ataxic     General Gait Details: pt with uncoordinated LE movement, slow short steps from [facility] to recliner, assist for stabilizing  Stairs            Wheelchair Mobility    Modified Rankin (Stroke Patients Only)       Balance                                             Pertinent Vitals/Pain Pain Assessment: No/denies pain    Home Living Family/patient expects to be discharged to:: Private residence Living Arrangements: Spouse/significant other Available Help at Discharge: Family;Available 24 hours/day Type of Home: Apartment       Home Layout: One level Home Equipment: [place] - 2 wheels;Tub bench;Bedside commode;Wheelchair - manual      Prior Function Level of Independence: Needs assistance   Gait / Transfers Assistance Needed: pt uses RW and  ambulates with therapy, typically uses w/c, spouse assists as needed with transfers     Comments: spouse reports assisting with gait belt, pt receiving HHPT and has aide     Hand Dominance        Extremity/Trunk Assessment        Lower Extremity Assessment Lower Extremity Assessment: Generalized weakness(pt reports he has been working on strengthening with therapy prior to admission)       Communication   Communication: No difficulties  Cognition Arousal/Alertness:  Awake/alert Behavior During Therapy: WFL for tasks assessed/performed                                   General Comments: follows commands, poor historian      General Comments      Exercises     Assessment/Plan    PT Assessment Patient needs continued PT services  PT Problem List Decreased strength;Decreased mobility;Decreased balance;Decreased knowledge of use of DME       PT Treatment Interventions DME instruction;Therapeutic activities;Gait training;Therapeutic exercise;Functional mobility training;Balance training;Patient/family education    PT Goals (Current goals can be found in the Care Plan section)  Acute Rehab PT Goals PT Goal Formulation: With patient Time For Goal Achievement: 08/19/19 Potential to Achieve Goals: Good    Frequency Min 3X/week   Barriers to discharge        Co-evaluation               AM-PAC PT "6 Clicks" Mobility  Outcome Measure Help needed turning from your back to your side while in a flat bed without using bedrails?: A Little Help needed moving from lying on your back to sitting on the side of a flat bed without using bedrails?: A Little Help needed moving to and from a bed to a chair (including a wheelchair)?: A Lot Help needed standing up from a chair using your arms (e.g., wheelchair or bedside chair)?: A Lot Help needed to walk in hospital room?: A Lot Help needed climbing 3-5 steps with a railing? : Total 6 Click Score: 13    End of Session Equipment Utilized During Treatment: Gait belt Activity Tolerance: Patient tolerated treatment well Patient left: in chair;with call bell/phone within reach;with chair alarm set;with family/visitor present Nurse Communication: Mobility status PT Visit Diagnosis: Unsteadiness on feet (R26.81);Muscle weakness (generalized) (M62.81)    Time: [id number] PT Time Calculation (min) (ACUTE ONLY): 27 min   Charges:   PT Evaluation $PT Eval Low Complexity: [place], PT, DPT Acute Rehabilitation Services Office: [phone] Pager: [phone]  Trena Platt 08/05/2019, 3:33 PM

## 2019-08-06 DIAGNOSIS — R627 Adult failure to thrive: Secondary | ICD-10-CM

## 2019-08-06 DIAGNOSIS — D638 Anemia in other chronic diseases classified elsewhere: Secondary | ICD-10-CM

## 2019-08-06 LAB — CBC WITH DIFFERENTIAL/PLATELET
Abs Immature Granulocytes: 0.01 10*3/uL (ref 0.00–0.07)
Basophils Absolute: 0 10*3/uL (ref 0.0–0.1)
Basophils Relative: 0 %
Eosinophils Absolute: 0.1 10*3/uL (ref 0.0–0.5)
Eosinophils Relative: 3 %
HCT: 28 % — ABNORMAL LOW (ref 39.0–52.0)
Hemoglobin: 8.8 g/dL — ABNORMAL LOW (ref 13.0–17.0)
Immature Granulocytes: 0 %
Lymphocytes Relative: 32 %
Lymphs Abs: 1.2 10*3/uL (ref 0.7–4.0)
MCH: 29.6 pg (ref 26.0–34.0)
MCHC: 31.4 g/dL (ref 30.0–36.0)
MCV: 94.3 fL (ref 80.0–100.0)
Monocytes Absolute: 0.5 10*3/uL (ref 0.1–1.0)
Monocytes Relative: 14 %
Neutro Abs: 1.9 10*3/uL (ref 1.7–7.7)
Neutrophils Relative %: 51 %
Platelets: 222 10*3/uL (ref 150–400)
RBC: 2.97 MIL/uL — ABNORMAL LOW (ref 4.22–5.81)
RDW: 17.9 % — ABNORMAL HIGH (ref 11.5–15.5)
WBC: 3.7 10*3/uL — ABNORMAL LOW (ref 4.0–10.5)
nRBC: 0 % (ref 0.0–0.2)

## 2019-08-06 LAB — BASIC METABOLIC PANEL
Anion gap: 8 (ref 5–15)
BUN: 5 mg/dL — ABNORMAL LOW (ref 8–23)
CO2: 22 mmol/L (ref 22–32)
Calcium: 7.9 mg/dL — ABNORMAL LOW (ref 8.9–10.3)
Chloride: 108 mmol/L (ref 98–111)
Creatinine, Ser: 0.71 mg/dL (ref 0.61–1.24)
GFR calc Af Amer: >60 mL/min (ref >60)
GFR calc non Af Amer: >60 mL/min (ref >60)
Glucose, Bld: 93 mg/dL (ref 70–99)
Potassium: 2.9 mmol/L — ABNORMAL LOW (ref 3.5–5.1)
Sodium: 138 mmol/L (ref 135–145)

## 2019-08-06 LAB — MAGNESIUM: Magnesium: 1.9 mg/dL (ref 1.7–2.4)

## 2019-08-06 MED ORDER — HEPARIN SOD (PORK) LOCK FLUSH 100 UNIT/ML IV SOLN
500.0000 [IU] | INTRAVENOUS | Status: AC | PRN
  Administered 2019-08-06: 500 [IU]

## 2019-08-06 MED ORDER — POTASSIUM CHLORIDE CRYS ER 20 MEQ PO TBCR: 20.0000 meq | EXTENDED_RELEASE_TABLET | Freq: Every day | ORAL | 0 refills | Status: DC

## 2019-08-06 MED ORDER — POTASSIUM CHLORIDE CRYS ER 20 MEQ PO TBCR
40.0000 meq | EXTENDED_RELEASE_TABLET | ORAL | Status: AC
  Administered 2019-08-06 (×2): 40 meq via ORAL
  Filled 2019-08-06 (×2): qty 2

## 2019-08-06 NOTE — Discharge Summary (Addendum)
Discharge Summary  Dennis Fischer [id number] DOB: 29-Jul-1946  PCP: [name], MD  Admit date: 08/03/2019 Discharge date: 08/06/2019  Time spent: 22mins, more than 50% time spent on coordination of care.  Recommendations for Outpatient Follow-up:  1. F/u with PCP on 12/1  for hospital discharge follow up, repeat cbc/bmp at follow up. pcp to monitor anemia and potassium level. 2. F/u with oncology Dr Alen Blew 3. Resume outpatient neurorehab   Discharge Diagnoses:  Active Hospital Problems   Diagnosis Date Noted   Sepsis ([facility]) 08/03/2019   Noninfectious colitis 08/03/2019   Constipation 05/15/2019   Essential hypertension    Prostate cancer ([facility]) 01/22/2017    Resolved Hospital Problems  No resolved problems to display.    Discharge Condition: stable  Diet recommendation: heart healthy  There were no vitals filed for this visit.  History of present illness:  (per admitting MD Dr Jonelle Sidle on 11/23) Patient coming from: Home  Chief Complaint: Vomiting feces  HPI: Dennis Fischer is a 73 y.o. male with medical history significant of prostate cancer with multiple metastasis to the bone and other parts of the body, GERD, hypertension, recurrent constipation, previous GI bleed who presented with bilious vomiting noted to include feces.  Patient has taken Senokot and MiraLAX for constipation given from his oncologist 3 days ago.  His continues to have significant abdominal distention.  Patient has had 3 episode of emesis prior to coming to the hospital.  In the ER he was seen and evaluated and appears to have evidence of sepsis by criteria and possible UTI.  He is being admitted to the hospital for treatment..  ED Course: Temperature 100.7 blood pressure 139/92 pulse 138 respiratory of 25 and oxygen sat 93% on room air.  White count 5.5 hemoglobin 10.7 platelets 306.  Urinalysis showed cloudy urine with large leukocytes.  WBC more than 50 and rare bacteria COVID-19 is  currently pending.  Chemistries relatively normal.  Lactic acid 1.8.  CT chest without contrast and CT abdomen and pelvis showed stercoral colitis with proctitis.  He will be admitted for further work-up.  Hospital Course:  Principal Problem:   Sepsis ([facility]) Active Problems:   Prostate cancer ([facility])   Essential hypertension   Constipation   Noninfectious colitis  Sepsis presented on admission, with fever, tachycardia and tachypnea. -Source of infection including UTI, possible proctitis and stercoral colitis in the setting of severe constipation. -Blood culture no growth to date -Urine culture grew Klebsiella pneumoniae, resistant to ampicillin and nitrofurantoin.  Sensitive to the rest of antibiotic tested.  Details please refer to original culture result. -COVID-19 screening negative -He received 3 days of IV Rocephin and IV Flagyl , fever resolved, sepsis resolved. he is  Feeling much better and back to his baseline with good appetite, he is discharged on oral antibiotic (keflex and flagyl) for 34more days to finish total of 7 days of abx treatment , case discussed with pharmacy.  Nausea /vomiting , likely due to sepsis and severe constipation -Sepsis resolved ,constipation resolved -DC IV fluids ,he tolerated diet advancement.  Hypokalemia - telemetry unremarkable, replace potassium,  magnesium 1.9 -He is discharged on 3 more days of potassium supplement, pcp to repeat  potassium at hospital discharge follow-up on December 1.  History of GI bleed Currently denies blood in the stool,  no signs of active bleeding PCP to repeat CBC on 12/1  Anemia of chronic disease -pcp to monitor cbc  Essential hypertension -Norvasc held in the hospital, resumed at discharge,  continue Coreg  Metastatic prostate cancer with spine mets With bony lesions and possible for nodules on CT scan He is to continue to follow with oncology Dr. Alen Blew   Failure to thrive, he reports use wheelchair  at baseline.  He lives with his wife at home  physical therapy, recomnmended home health. Case manager informed. Patient is active with outpatient neurorehab, will resume.  DVT Prophylaxis while in the hospital: subQ lovenox  Code Status: DNR  Consultants:  Case manager, physical therapy  Procedures:  none  Antibiotics:  Iv rocephin, iv flagyl  Discharge Exam: BP 125/81 (BP Location: Right Arm)    Pulse 94    Temp 98.7 F (37.1 C) (Oral)    Resp 18    Ht 6' (1.829 m)    SpO2 100%    BMI 23.06 kg/m   General: NAD, pleasant Cardiovascular: RRR Respiratory: CTABL  Discharge Instructions You were cared for by a hospitalist during your hospital stay. If you have any questions about your discharge medications or the care you received while you were in the hospital after you are discharged, you can call the unit and asked to speak with the hospitalist on call if the hospitalist that took care of you is not available. Once you are discharged, your primary care physician will handle any further medical issues. Please note that NO REFILLS for any discharge medications will be authorized once you are discharged, as it is imperative that you return to your primary care physician (or establish a relationship with a primary care physician if you do not have one) for your aftercare needs so that they can reassess your need for medications and monitor your lab values.  Discharge Instructions    Diet - low sodium heart healthy   Complete by: As directed    Increase activity slowly   Complete by: As directed      Allergies as of 08/06/2019      Reactions   Lisinopril Swelling   Facial swelling   Cat Hair Extract    eyes swell   Mangifera Indica    MANGO --   Mango Flavor    throat swells with mango   Other    Peanut-containing Drug Products    Pistachio Nut (diagnostic)    hands and feet burn/itch   Sunflower Oil    Sunflower seed allergy      Medication List    STOP  taking these medications   dexamethasone 2 MG tablet Commonly known as: DECADRON   HYDROcodone-acetaminophen 5-325 MG tablet Commonly known as: Norco   lidocaine-prilocaine cream Commonly known as: EMLA   megestrol 400 MG/10ML suspension Commonly known as: MEGACE   ondansetron 4 MG tablet Commonly known as: ZOFRAN     TAKE these medications   acetaminophen 325 MG tablet Commonly known as: TYLENOL Take 1-2 tablets (325-650 mg total) by mouth every 4 (four) hours as needed for mild pain.   amLODipine 10 MG tablet Commonly known as: NORVASC TAKE 1 TABLET(10 MG) BY MOUTH DAILY What changed: See the new instructions.   Calcium 500/D 500-200 MG-UNIT tablet Generic drug: calcium-vitamin D TAKE 1 TABLET BY MOUTH TWICE DAILY What changed: when to take this   carvedilol 12.5 MG tablet Commonly known as: COREG TAKE 1 TABLET BY MOUTH TWICE DAILY WITH A MEAL What changed:   how much to take  how to take this  when to take this  additional instructions   cephALEXin 500 MG capsule Commonly known as: KEFLEX  Take 1 capsule (500 mg total) by mouth 3 (three) times daily for 4 days. Start taking on: August 07, 2019   cyclobenzaprine 10 MG tablet Commonly known as: FLEXERIL TAKE 1 TABLET BY MOUTH THREE TIMES DAILY AS NEEDED FOR MUSCLE SPASMS What changed:   reasons to take this  additional instructions   gabapentin 300 MG capsule Commonly known as: NEURONTIN Take 1 capsule (300 mg total) by mouth at bedtime. What changed: when to take this   metroNIDAZOLE 500 MG tablet Commonly known as: Flagyl Take 1 tablet (500 mg total) by mouth 3 (three) times daily for 4 days. Start taking on: August 07, 2019   multivitamin with minerals Tabs tablet Take 1 tablet by mouth daily.   pantoprazole 40 MG tablet Commonly known as: PROTONIX Take 1 tablet (40 mg total) by mouth daily.   polyethylene glycol 17 g packet Commonly known as: MIRALAX / GLYCOLAX Take 17 g by mouth  daily as needed for mild constipation.   potassium chloride SA 20 MEQ tablet Commonly known as: KLOR-CON Take 1 tablet (20 mEq total) by mouth daily for 3 days. Patient reports has potassium pills at home.   prochlorperazine 10 MG tablet Commonly known as: COMPAZINE TAKE 1 TABLET BY MOUTH EVERY 6 HOURS AS NEEDED FOR NAUSEA OR VOMITING What changed: See the new instructions.   senna-docusate 8.6-50 MG tablet Commonly known as: Senokot-S Take 2 tablets by mouth 2 (two) times daily.   simvastatin 20 MG tablet Commonly known as: ZOCOR Take 1 tablet (20 mg total) by mouth daily.      Allergies  Allergen Reactions   Lisinopril Swelling    Facial swelling   Cat Hair Extract     eyes swell   Mangifera Indica     MANGO --   Mango Flavor     throat swells with mango   Other    Peanut-Containing Drug Products    Pistachio Nut (Diagnostic)     hands and feet burn/itch   Sunflower Oil     Sunflower seed allergy   Follow-up Information    [name], MD Follow up on 08/11/2019.   Specialty: Family Medicine Why: hospital discharge follow up, repeat lab works including cbc/bmp at follow up. Tues the 1st at 11:00 with Dr Irish Elders. pcp to monitor anemia and potassium level. Contact information: [address] [address] [phone]        Wyatt Portela, MD Follow up.   Specialty: Oncology Contact information: [place] [address] [phone]            The results of significant diagnostics from this hospitalization (including imaging, microbiology, ancillary and laboratory) are listed below for reference.    Significant Diagnostic Studies: Ct Angio Chest Pe W/cm &/or Wo Cm  Result Date: 08/03/2019 CLINICAL DATA:  Constipation for 1 week with nausea and vomiting. Reportedly vomiting feces. Rule out impaction. History of prostate cancer. EXAM: CT ANGIOGRAPHY CHEST CT ABDOMEN AND PELVIS WITH CONTRAST TECHNIQUE:  Multidetector CT imaging of the chest was performed using the standard protocol during bolus administration of intravenous contrast. Multiplanar CT image reconstructions and MIPs were obtained to evaluate the vascular anatomy. Multidetector CT imaging of the abdomen and pelvis was performed using the standard protocol during bolus administration of intravenous contrast. CONTRAST:  169mL OMNIPAQUE IOHEXOL 350 MG/ML SOLN COMPARISON:  Plain films of earlier in the day. Abdominopelvic CT of 11/17/2018. No prior chest CT. FINDINGS: CTA CHEST FINDINGS Cardiovascular:  The quality of this exam for evaluation of pulmonary embolism is moderate. Portions of exam are motion degraded. The bolus is relatively well timed. No pulmonary embolism to the large segmental level. A right Port-A-Cath with tip at mid right atrium. Aortic and branch vessel atherosclerosis. Tortuous thoracic aorta. Mild cardiomegaly, with trace pericardial fluid. Multivessel coronary artery atherosclerosis. Mediastinum/Nodes: No mediastinal or hilar adenopathy. Tiny hiatal hernia. Lungs/Pleura: Small right pleural effusion. Probable scarring in the right apex. Right middle lobe 5 mm pulmonary nodule on 82/6. Left upper lobe 4 mm nodule on 73/6. Neither of these areas were included on prior abdominal CTs. Musculoskeletal: Moderate right greater than left gynecomastia. Widespread sclerotic osseous metastasis, including throughout the right scapula, vertebral bodies, and bilateral ribs. Review of the MIP images confirms the above findings. CT ABDOMEN and PELVIS FINDINGS Hepatobiliary: Normal liver. Normal gallbladder, without biliary ductal dilatation. Pancreas: Normal, without mass or ductal dilatation. Spleen: Normal in size, without focal abnormality. Adrenals/Urinary Tract: Normal adrenal glands. Normal kidneys, without hydronephrosis. Degraded evaluation of the pelvis, secondary to beam hardening artifact from bilateral hip arthroplasty. Grossly normal  urinary bladder. Stomach/Bowel: Tiny hiatal hernia. Small amount of stool in the rectum. There is probable rectal wall thickening and surrounding edema, including on 77/11 and continuing to surround the anus on 85/11. Large amount of colonic stool more proximally. Normal terminal ileum and appendix. Normal small bowel. Vascular/Lymphatic: Aortic and branch vessel atherosclerosis. No abdominopelvic adenopathy. Previously described inguinal adenopathy is no longer identified. Reproductive: Not well evaluated. Other: No gross free pelvic fluid. No abdominal ascites. No free intraperitoneal air. Musculoskeletal: Bilateral hip arthroplasty. Grade 1-2 anterolisthesis of L4 on L5. Review of the MIP images confirms the above findings. IMPRESSION: 1. No evidence of pulmonary embolism. Please see limitations above. 2. Extensive osseous metastasis throughout the chest. 3. Bilateral pulmonary nodules, above the level of scanning on prior abdominal CTs. Indeterminate. Cannot exclude pulmonary metastasis. 4. No evidence of soft tissue metastasis within the abdomen or pelvis. Previously described inguinal adenopathy has resolved. 5. Possible constipation. Suspicion of proctitis, as evidenced by rectal wall thickening and surrounding edema. This is nonspecific, but given clinical suspicion of fecal impaction, could represent stercoral colitis. 6. Degraded evaluation of the pelvis, secondary to beam hardening artifact from bilateral hip arthroplasty. 7. Coronary artery atherosclerosis. Aortic Atherosclerosis (ICD10-I70.0). 8. Bilateral gynecomastia. Electronically Signed   By: Abigail Miyamoto M.D.   On: 08/03/2019 20:13   Ct Abdomen Pelvis W Contrast  Result Date: 08/03/2019 CLINICAL DATA:  Constipation for 1 week with nausea and vomiting. Reportedly vomiting feces. Rule out impaction. History of prostate cancer. EXAM: CT ANGIOGRAPHY CHEST CT ABDOMEN AND PELVIS WITH CONTRAST TECHNIQUE: Multidetector CT imaging of the chest was  performed using the standard protocol during bolus administration of intravenous contrast. Multiplanar CT image reconstructions and MIPs were obtained to evaluate the vascular anatomy. Multidetector CT imaging of the abdomen and pelvis was performed using the standard protocol during bolus administration of intravenous contrast. CONTRAST:  132mL OMNIPAQUE IOHEXOL 350 MG/ML SOLN COMPARISON:  Plain films of earlier in the day. Abdominopelvic CT of 11/17/2018. No prior chest CT. FINDINGS: CTA CHEST FINDINGS Cardiovascular: The quality of this exam for evaluation of pulmonary embolism is moderate. Portions of exam are motion degraded. The bolus is relatively well timed. No pulmonary embolism to the large segmental level. A right Port-A-Cath with tip at mid right atrium. Aortic and branch vessel atherosclerosis. Tortuous thoracic aorta. Mild cardiomegaly, with trace pericardial fluid. Multivessel coronary artery atherosclerosis. Mediastinum/Nodes: No  mediastinal or hilar adenopathy. Tiny hiatal hernia. Lungs/Pleura: Small right pleural effusion. Probable scarring in the right apex. Right middle lobe 5 mm pulmonary nodule on 82/6. Left upper lobe 4 mm nodule on 73/6. Neither of these areas were included on prior abdominal CTs. Musculoskeletal: Moderate right greater than left gynecomastia. Widespread sclerotic osseous metastasis, including throughout the right scapula, vertebral bodies, and bilateral ribs. Review of the MIP images confirms the above findings. CT ABDOMEN and PELVIS FINDINGS Hepatobiliary: Normal liver. Normal gallbladder, without biliary ductal dilatation. Pancreas: Normal, without mass or ductal dilatation. Spleen: Normal in size, without focal abnormality. Adrenals/Urinary Tract: Normal adrenal glands. Normal kidneys, without hydronephrosis. Degraded evaluation of the pelvis, secondary to beam hardening artifact from bilateral hip arthroplasty. Grossly normal urinary bladder. Stomach/Bowel: Tiny hiatal  hernia. Small amount of stool in the rectum. There is probable rectal wall thickening and surrounding edema, including on 77/11 and continuing to surround the anus on 85/11. Large amount of colonic stool more proximally. Normal terminal ileum and appendix. Normal small bowel. Vascular/Lymphatic: Aortic and branch vessel atherosclerosis. No abdominopelvic adenopathy. Previously described inguinal adenopathy is no longer identified. Reproductive: Not well evaluated. Other: No gross free pelvic fluid. No abdominal ascites. No free intraperitoneal air. Musculoskeletal: Bilateral hip arthroplasty. Grade 1-2 anterolisthesis of L4 on L5. Review of the MIP images confirms the above findings. IMPRESSION: 1. No evidence of pulmonary embolism. Please see limitations above. 2. Extensive osseous metastasis throughout the chest. 3. Bilateral pulmonary nodules, above the level of scanning on prior abdominal CTs. Indeterminate. Cannot exclude pulmonary metastasis. 4. No evidence of soft tissue metastasis within the abdomen or pelvis. Previously described inguinal adenopathy has resolved. 5. Possible constipation. Suspicion of proctitis, as evidenced by rectal wall thickening and surrounding edema. This is nonspecific, but given clinical suspicion of fecal impaction, could represent stercoral colitis. 6. Degraded evaluation of the pelvis, secondary to beam hardening artifact from bilateral hip arthroplasty. 7. Coronary artery atherosclerosis. Aortic Atherosclerosis (ICD10-I70.0). 8. Bilateral gynecomastia. Electronically Signed   By: Abigail Miyamoto M.D.   On: 08/03/2019 20:13   Dg Abdomen Acute W/chest  Result Date: 08/03/2019 CLINICAL DATA:  73 year old male with concern for obstruction. History of prostate cancer with osseous metastasis. EXAM: DG ABDOMEN ACUTE W/ 1V CHEST COMPARISON:  Radiograph dated 06/05/2019 FINDINGS: The lungs are clear. There is no pleural effusion or pneumothorax. The cardiac silhouette is within normal  limits. Atherosclerotic calcification of the aorta. Right pectoral Port-A-Cath in similar position. Large amount of stool noted throughout the colon. No bowel dilatation or evidence of obstruction. No free air or radiopaque calculi. Bilateral hip arthroplasties. Prostate fiducial markers. The soft tissues are unremarkable. Sclerotic changes of the right scapula similar to prior radiograph in keeping with metastatic disease. IMPRESSION: 1. No acute cardiopulmonary process. 2. Constipation.  No evidence of bowel obstruction. Electronically Signed   By: Anner Crete M.D.   On: 08/03/2019 17:09    Microbiology: Recent Results (from the past 240 hour(s))  Urine culture     Status: Abnormal   Collection Time: 08/03/19  5:23 PM   Specimen: In/Out Cath Urine  Result Value Ref Range Status   Specimen Description   Final    IN/OUT CATH URINE Performed at [facility] [address], [place], [place] [address]    Special Requests   Final    NONE Performed at [facility], [place] [address], [place], [place] [address]    Culture >=100,000 COLONIES/mL KLEBSIELLA PNEUMONIAE (A)  Final  Report Status 08/05/2019 FINAL  Final   Organism ID, Bacteria KLEBSIELLA PNEUMONIAE (A)  Final      Susceptibility   Klebsiella pneumoniae - MIC*    AMPICILLIN >=32 RESISTANT Resistant     CEFAZOLIN <=4 SENSITIVE Sensitive     CEFTRIAXONE <=1 SENSITIVE Sensitive     CIPROFLOXACIN <=0.25 SENSITIVE Sensitive     GENTAMICIN <=1 SENSITIVE Sensitive     IMIPENEM 0.5 SENSITIVE Sensitive     NITROFURANTOIN 128 RESISTANT Resistant     TRIMETH/SULFA <=20 SENSITIVE Sensitive     AMPICILLIN/SULBACTAM 4 SENSITIVE Sensitive     PIP/TAZO <=4 SENSITIVE Sensitive     Extended ESBL NEGATIVE Sensitive     * >=100,000 COLONIES/mL KLEBSIELLA PNEUMONIAE  Culture, blood (routine x 2)     Status: None (Preliminary result)   Collection Time: 08/03/19  5:34 PM   Specimen: BLOOD LEFT HAND  Result  Value Ref Range Status   Specimen Description   Final    BLOOD LEFT HAND Performed at [facility] [address], [place], [place] [address]    Special Requests   Final    BOTTLES DRAWN AEROBIC ONLY Blood Culture adequate volume Performed at [facility] [address] [place], [place] [address]    Culture   Final    NO GROWTH 2 DAYS Performed at [facility] [address], [place], [place] [address]    Report Status PENDING  Incomplete  Culture, blood (routine x 2)     Status: None (Preliminary result)   Collection Time: 08/03/19  5:45 PM   Specimen: BLOOD RIGHT HAND  Result Value Ref Range Status   Specimen Description   Final    BLOOD RIGHT HAND Performed at [facility] [address], [place], [place] [address]    Special Requests   Final    BOTTLES DRAWN AEROBIC ONLY Blood Culture adequate volume Performed at [facility] [address] [place], [place] [address]    Culture   Final    NO GROWTH 2 DAYS Performed at [facility] [address] [place], [place] [address]    Report Status PENDING  Incomplete  SARS CORONAVIRUS 2 (TAT 6-24 HRS) Nasopharyngeal Nasopharyngeal Swab     Status: None   Collection Time: 08/03/19  5:49 PM   Specimen: Nasopharyngeal Swab  Result Value Ref Range Status   SARS Coronavirus 2 NEGATIVE NEGATIVE Final    Comment: (NOTE) SARS-CoV-2 target nucleic acids are NOT DETECTED. The SARS-CoV-2 RNA is generally detectable in upper and lower respiratory specimens during the acute phase of infection. Negative results do not preclude SARS-CoV-2 infection, do not rule out co-infections with other pathogens, and should not be used as the sole basis for treatment or other patient management decisions. Negative results must be combined with clinical observations, patient history, and epidemiological information. The expected result is Negative. Fact Sheet  for Patients: SugarRoll.be Fact Sheet for Healthcare Providers: https://www.woods-mathews.com/ This test is not yet approved or cleared by the [place] FDA and  has been authorized for detection and/or diagnosis of SARS-CoV-2 by FDA under an Emergency Use Authorization (EUA). This EUA will remain  in effect (meaning this test can be used) for the duration of the COVID-19 declaration under Section 56 4(b)(1) of the Act, 21 U.S.C. section 360bbb-3(b)(1), unless the authorization is terminated or revoked sooner. Performed at [facility], [place] [address] [place], [place] [address]   Blood  Culture (routine x 2)     Status: None (Preliminary result)   Collection Time: 08/04/19 12:41 AM   Specimen: BLOOD  Result Value Ref Range Status   Specimen Description   Final    BLOOD BLOOD RIGHT HAND Performed at [facility] [address], [place], [place] [address]    Special Requests   Final    BOTTLES DRAWN AEROBIC ONLY Blood Culture adequate volume Performed at [facility] [address], [place], [place] [address]    Culture   Final    NO GROWTH 1 DAY Performed at [facility], [place] [address], [place], [place] [address]    Report Status PENDING  Incomplete  Blood Culture (routine x 2)     Status: None (Preliminary result)   Collection Time: 08/04/19 12:41 AM   Specimen: BLOOD  Result Value Ref Range Status   Specimen Description   Final    BLOOD BLOOD RIGHT HAND Performed at [facility] [address], [place], [place] [address]    Special Requests   Final    BOTTLES DRAWN AEROBIC ONLY Blood Culture adequate volume Performed at [facility] [address], [place], [place] [address]    Culture   Final    NO GROWTH 1 DAY Performed at [facility], [place] [address], [place], [place] [address]    Report Status PENDING  Incomplete      Labs: Basic Metabolic Panel: Recent Labs  Lab 08/03/19 1215 08/04/19 0556 08/05/19 0401 08/06/19 0323  NA 138 140 139 138  K 3.7 3.2* 2.9* 2.9*  CL 104 111 109 108  CO2 22 23 22 22   GLUCOSE 148* 131* 110* 93  BUN 13 11 6* 5*  CREATININE 0.96 0.81 0.65 0.71  CALCIUM 9.1 8.2* 8.1* 7.9*  MG  --   --   --  1.9   Liver Function Tests: Recent Labs  Lab 08/03/19 1215 08/04/19 0556  AST 16 13*  ALT 13 11  ALKPHOS 65 47  BILITOT 0.5 0.7  PROT 7.5 6.0*  ALBUMIN 3.9 3.1*   Recent Labs  Lab 08/03/19 1215  LIPASE 20   No results for input(s): AMMONIA in the last 168 hours. CBC: Recent Labs  Lab 08/03/19 1215 08/04/19 0556 08/05/19 0401 08/06/19 0323  WBC 5.5 4.2 4.5 3.7*  NEUTROABS  --   --   --  1.9  HGB 11.7* 8.8* 9.0* 8.8*  HCT 36.7* 27.6* 28.1* 28.0*  MCV 92.7 94.8 94.6 94.3  PLT 306 198 211 222   Cardiac Enzymes: No results for input(s): CKTOTAL, CKMB, CKMBINDEX, TROPONINI in the last 168 hours. BNP: BNP (last 3 results) No results for input(s): BNP in the last 8760 hours.  ProBNP (last 3 results) No results for input(s): PROBNP in the last 8760 hours.  CBG: No results for input(s): GLUCAP in the last 168 hours.     Signed:  Florencia Reasons MD, PhD, FACP  [facility] 08/06/2019, 8:38 AM

## 2019-08-08 LAB — CULTURE, BLOOD (ROUTINE X 2)
Culture: NO GROWTH
Culture: NO GROWTH
Special Requests: ADEQUATE
Special Requests: ADEQUATE

## 2019-08-09 LAB — CULTURE, BLOOD (ROUTINE X 2)
Culture: NO GROWTH
Culture: NO GROWTH
Special Requests: ADEQUATE
Special Requests: ADEQUATE

## 2019-08-10 ENCOUNTER — Encounter: Payer: Self-pay | Admitting: Physical Therapy

## 2019-08-10 ENCOUNTER — Other Ambulatory Visit: Payer: Self-pay

## 2019-08-10 ENCOUNTER — Ambulatory Visit: Payer: Medicare Other | Admitting: Physical Therapy

## 2019-08-10 DIAGNOSIS — M6281 Muscle weakness (generalized): Secondary | ICD-10-CM | POA: Diagnosis not present

## 2019-08-10 DIAGNOSIS — R2689 Other abnormalities of gait and mobility: Secondary | ICD-10-CM | POA: Diagnosis not present

## 2019-08-10 DIAGNOSIS — R293 Abnormal posture: Secondary | ICD-10-CM

## 2019-08-10 NOTE — Therapy (Signed)
[facility] [address] [place], [place], [address] Phone: [phone]   Fax:  [phone]  Physical Therapy Treatment  Patient Details  Name: Dennis [facility] MRN: [id number] Date of Birth: November 27, 1945 Referring Provider (PT): Barrett Shell, MD   Encounter Date: 08/10/2019  PT End of Session - 08/10/19 2121    Visit Number  7    Number of Visits  17    Date for PT Re-Evaluation  09/28/19   written for 90 day POC   Authorization Type  Medicare - will need 10th visit PN    PT Start Time  1450    PT Stop Time  1531    PT Time Calculation (min)  41 min    Equipment Utilized During Treatment  Gait belt    Activity Tolerance  Patient limited by fatigue;Patient tolerated treatment well    Behavior During Therapy  [facility] for tasks assessed/performed       Past Medical History:  Diagnosis Date  . Acute blood loss anemia   . Hypertension   . Metastatic cancer to bone ([place]) dx'd 2018  . Prostate cancer [facility])     Past Surgical History:  Procedure Laterality Date  . HIP SURGERY Bilateral   . IR IMAGING GUIDED PORT INSERTION  04/22/2018  . LAMINECTOMY N/A 03/09/2019   Procedure: THORACIC SEVEN - THORACIC EIGHT - THORACIC NINE LAMINECTOMY WITH RESECTION OF EPIDURAL TUMOR;  Surgeon: Earnie Larsson, MD;  Location: [facility];  Service: Neurosurgery;  Laterality: N/A;    There were no vitals filed for this visit.  Subjective Assessment - 08/10/19 1454    Subjective  Was hospitalized on 11/23 - sepsis presented on admission, with fever, tachycardia and tachypnea. Was discharged from [facility] on 08/05/19. Reports he has been doing some of his exercises since coming home from the [facility].    Pertinent History  Prostate cancer, metastatic cancer to spine, HTN, cord compression myelopathy (status post epidural compression and decompressive laminectomy completed on March 09, 2019 )    Limitations  Walking;Standing;House hold activities     How long can you walk comfortably?  down his hallway at home with home health.    Patient Stated Goals  wants to feel more comfortable in the RW, wants to be more independent.    Currently in Pain?  No/denies                       [facility] Adult PT Treatment/Exercise - 08/10/19 0001      Transfers   Transfers  Sit to Stand;Stand to Sit;Squat Pivot Transfers    Sit to Stand  4: Min assist;4: Min guard    Sit to Stand Details  Verbal cues for technique;Verbal cues for sequencing    Sit to Stand Details (indicate cue type and reason)  10 reps throughout today's session - from lower blue mat table and higher mat table - cues for UE placement, min A from lower mat table and min guard from higer mat table with initial min A in standing for balance.     Stand to Sit  4: Min guard    Stand to Sit Details (indicate cue type and reason)  Tactile cues for weight shifting;Verbal cues for sequencing;Verbal cues for technique;Verbal cues for precautions/safety;Verbal cues for safe use of DME/AE;Manual facilitation for weight shifting   cues to reach posteriorly before sitting   Stand Pivot Transfers  4: Min assist;4: Min guard    Stand Pivot  Transfer Details (indicate cue type and reason)  with RW, from mat table to and from w/c 2 reps, cues for step placement    Squat Pivot Transfers  3: Mod assist;4: Min assist    Squat Pivot Transfer Details (indicate cue type and reason)  Pt requiring min-mod A at hips to perform safely today    Comments  Cues for upright posture when standing with RW      Neuro Re-ed    Neuro Re-ed Details   Standing at RW: alternating UE raises 2x 5 reps for balance, min A for safety       Exercises   Other Exercises   2 x 2:30 SciFit for BLE strength and endurance using BLE with red theraband around distal thighs for hip ABD - frequent cues from therapist for hip ABD activation while performing. HR WFL (approx.. 125 bpm) throughout.                PT Short  Term Goals - 08/10/19 2122      PT SHORT TERM GOAL #1   Title  Pt and pt's caregiver/wife will be independent with initial HEP for LE strengthening and ROM. ALL STGS DUE 07/30/19    Baseline  pt subjectively reports he is performing HEP    Time  4    Period  Weeks    Status  Achieved    Target Date  07/30/19      PT SHORT TERM GOAL #2   Title  Patient will perform squat pivot transfer from w/c <> mat table with min guard in order to decrease caregiver burden.    Baseline  required min-mod A on 08/10/19    Time  4    Period  Weeks    Status  Not Met      PT SHORT TERM GOAL #3   Title  Pt will perform log roll, sidelying to sit with correct mechanics and min A in order to demonstrate safe mechanics for spine and to decrease caregiver burden.    Baseline  did not perfrom on 08/10/19    Time  4    Period  Weeks    Status  Deferred      PT SHORT TERM GOAL #4   Title  Pt will perform sit <> stand from RW with min A from w/c vs. higher mat table in order to demonstrate improved LE strength and improve weight bearing.    Baseline  able to perform sit <> stand from higher mat table with min guard and lower mat table with min A and RW    Time  4    Period  Weeks    Status  Achieved        PT Long Term Goals - 06/30/19 1006      PT LONG TERM GOAL #1   Title  Pt and pt's caregiver/wife will be independent with initial HEP for LE strengthening and ROM. ALL LTGS DUE 08/29/19    Time  8    Period  Weeks    Status  New    Target Date  08/29/19      PT LONG TERM GOAL #2   Title  Patient will ambulate at least 30' with w/c follow and min guard in order to demonstrate improved household mobility.    Baseline  not yet assessed.    Time  8    Period  Weeks    Status  New      PT  LONG TERM GOAL #3   Title  Pt will perform sit <> stand from RW from w/c vs. mat table with supervision in order to demo improved functional transfers.    Time  8    Period  Weeks    Status  New      PT  LONG TERM GOAL #4   Title  Patient will tolerate static standing x5 minutes at countertop with BUE support in order to improve tolerance for ADLs.    Time  8    Period  Weeks    Status  New      PT LONG TERM GOAL #5   Title  Patient will perform all bed mobility with mod I in order to decrease caregiver burden.    Time  8    Period  Weeks    Status  New            Plan - 08/10/19 2124    Clinical Impression Statement  HR and O2 sats stable during session today. Checked pt's STGs - pt has met 2 out of 4. Pt needed mod A to perform squat pivot transfer today from w/c <> mat table. Pt able to perform sit <> stands with min guard from a higher mat table and min A from a lower mat table with RW. Pt is deconditioned and required intermittent rest breaks throughout today's session. Will continue to progress towards LTGs.    Personal Factors and Comorbidities  Age;Comorbidity 2;Past/Current Experience;Time since onset of injury/illness/exacerbation    Comorbidities  Prostate cancer, metastatic cancer to spine, cord compression myelopathy (status post epidural compression and decompressive laminectomy completed on March 09, 2019 )    Examination-Activity Limitations  Bed Mobility;Transfers;Stand;Squat;Locomotion Level    Examination-Participation Restrictions  Community Activity    Stability/Clinical Decision Making  Evolving/Moderate complexity    Rehab Potential  Fair    PT Frequency  2x / week    PT Duration  8 weeks    PT Treatment/Interventions  ADLs/Self Care Home Management;DME Instruction;Gait training;Stair training;Functional mobility training;Neuromuscular re-education;Balance training;Therapeutic exercise;Therapeutic activities;Patient/family education;Wheelchair mobility training;Passive range of motion;Energy conservation    PT Next Visit Plan  Monitor HR with session. Continue with standing balance, gait, functional transfers, activity tolerance, and strengthening.    Consulted  and Agree with Plan of Care  Patient       Patient will benefit from skilled therapeutic intervention in order to improve the following deficits and impairments:  Decreased activity tolerance, Decreased balance, Decreased endurance, Decreased range of motion, Decreased mobility, Decreased coordination, Decreased strength, Difficulty walking, Abnormal gait, Postural dysfunction  Visit Diagnosis: Muscle weakness (generalized)  Other abnormalities of gait and mobility  Abnormal posture     Problem List Patient Active Problem List   Diagnosis Date Noted  . Sepsis ([place]) 08/03/2019  . Noninfectious colitis 08/03/2019  . Constipation 05/15/2019  . Elevated BUN   . Transaminitis   . Hypoalbuminemia due to protein-calorie malnutrition ([place])   . Steroid-induced hyperglycemia   . Essential hypertension   . Metastatic cancer to spine ([place]) 03/11/2019  . Neuropathic pain   . Benign essential HTN   . Cocaine use 03/10/2019  . Cord compression myelopathy ([place])   . Spinal cord compression ([place]) 03/09/2019  . Prostate cancer metastatic to bone ([place]) 12/22/2018  . Port-A-Cath in place 07/02/2018  . Goals of care, counseling/discussion 01/20/2018  . Right knee pain 01/17/2018  . Prostate cancer ([place]) 01/22/2017  . Prediabetes 03/30/2016  . Positive FIT (fecal immunochemical  test) 11/22/2015  . GERD (gastroesophageal reflux disease) 12/21/2014  . Age-related nuclear cataract of both eyes 01/13/2013  . Hypertensive retinopathy 01/13/2013  . Essential (primary) hypertension 11/25/2012  . Pure hypercholesterolemia 11/25/2012  . History of total hip replacement 08/28/2010  . Tobacco dependence syndrome 03/22/2006    Arliss Journey, PT, DPT  08/10/2019, 9:31 PM  [facility] [address] [place], [place], [address] Phone: [phone]   Fax:  [phone]  Name: Veto Macqueen MRN: [id number] Date of Birth:  01/29/1946

## 2019-08-11 ENCOUNTER — Other Ambulatory Visit: Payer: Self-pay | Admitting: Medical

## 2019-08-11 ENCOUNTER — Encounter: Payer: Self-pay | Admitting: Family Medicine

## 2019-08-11 ENCOUNTER — Other Ambulatory Visit: Payer: Self-pay

## 2019-08-11 ENCOUNTER — Ambulatory Visit (INDEPENDENT_AMBULATORY_CARE_PROVIDER_SITE_OTHER): Payer: Medicare Other | Admitting: Family Medicine

## 2019-08-11 VITALS — BP 110/80 | HR 107

## 2019-08-11 DIAGNOSIS — D630 Anemia in neoplastic disease: Secondary | ICD-10-CM | POA: Diagnosis not present

## 2019-08-11 DIAGNOSIS — E876 Hypokalemia: Secondary | ICD-10-CM

## 2019-08-11 DIAGNOSIS — R82998 Other abnormal findings in urine: Secondary | ICD-10-CM | POA: Insufficient documentation

## 2019-08-11 DIAGNOSIS — G629 Polyneuropathy, unspecified: Secondary | ICD-10-CM

## 2019-08-11 DIAGNOSIS — K5900 Constipation, unspecified: Secondary | ICD-10-CM

## 2019-08-11 DIAGNOSIS — D649 Anemia, unspecified: Secondary | ICD-10-CM | POA: Diagnosis not present

## 2019-08-11 NOTE — Progress Notes (Signed)
   [facility] Phone: [phone]     [facility] - 73 y.o. male MRN [id number]  Date of birth: [birth year]  Subjective:   cc: Hospital follow-up for sepsis/constipation  HPI:  Patient states he is feeling better.  He is still taking his antibiotics, he thinks he has 2 more days left.  He had one episode of vomiting when he came back home on the first day, but then started taking his 2 antibiotics at different times instead of together and has not had any issues with nausea or vomiting since that time.  He is fecally incontinent, and his wife states that he has a little bit of liquid stool in his adult depends in the morning and at night, but both deny that he has had any solid bowel movement since discharge.  Patient also states that his urine is a dark color.  He drinks 216 ounce bottles of water a day.  He has been taking his potassium supplementation at home.  He takes 3 times a day, did not take any this morning.  ROS: See HPI for pertinent positives and negatives  Family history reviewed for today's visit. No changes.  Social history- patient is a former smoker  Objective:   BP 110/80   Pulse (!) 107   SpO2 98%  Gen: Alert and oriented.  Sitting in wheelchair.  Wife is with him HEENT: Moist oral mucosa.  No scleral icterus. CV: Regular rate and rhythm.  No murmurs. Resp: Lungs clear to auscultation bilaterally, no wheezes, no crackles GI: Soft, nontender to palpation.  Normal bowel sounds. Msk: In wheelchair, can lift his legs up on command.  Assessment/Plan:   Constipation Patient stating he is only had a couple small amounts of loose liquid stool since discharge.  Not complaining of abdominal discomfort or pain.  Drinks 32 ounces of water a day at least. -MiraLAX daily, titrate up as needed. -Increase water intake  Hypokalemia Normal kidney function.  Likely related to vomiting.  Will get repeat and adjust supplementation as needed.  Anemia  in neoplastic disease Stable.  No signs or symptoms of GI bleed currently.  Will get CBC to check hemoglobin levels.  Dark urine Patient unable to give urine sample today.  Appears to be adequately hydrated.  Probably from his medication.  Advised patient and his wife that if frank blood is noticed in the urine at any time you should come back to give a urine sample, otherwise he can provide urine sample at next office visit.     Clemetine Marker, MD PGY-2 [facility] Family Medicine Residency

## 2019-08-11 NOTE — Assessment & Plan Note (Signed)
Normal kidney function.  Likely related to vomiting.  Will get repeat and adjust supplementation as needed.

## 2019-08-11 NOTE — Assessment & Plan Note (Signed)
Stable.  No signs or symptoms of GI bleed currently.  Will get CBC to check hemoglobin levels.

## 2019-08-11 NOTE — Patient Instructions (Addendum)
It was nice to meet you today,  We will get some blood work to check for your potassium levels and anemia.  I also get a urinalysis to make sure there is no blood or anything else concerning in it.  To improve constipation, you should drink plenty of water.  You can drink another 16 ounce bottle of water in addition to the 2 you already drink a day.  MiraLAX is the best medication for constipation.  Drink 1 capful mixed with water per day, and increase each day by 1 capful until you have a bowel movement with the goal of having 1 soft bowel movement every 1 to 2 days.  Please follow-up with your primary care provider, Dr. [name] in the next few months.  Have a great day,  Clemetine Marker, MD

## 2019-08-11 NOTE — Assessment & Plan Note (Addendum)
Patient unable to give urine sample today.  Appears to be adequately hydrated.  Probably from his medication.  Advised patient and his wife that if frank blood is noticed in the urine at any time you should come back to give a urine sample, otherwise he can provide urine sample at next office visit.

## 2019-08-11 NOTE — Assessment & Plan Note (Signed)
Patient stating he is only had a couple small amounts of loose liquid stool since discharge.  Not complaining of abdominal discomfort or pain.  Drinks 32 ounces of water a day at least. -MiraLAX daily, titrate up as needed. -Increase water intake

## 2019-08-12 ENCOUNTER — Ambulatory Visit: Payer: Medicare Other | Admitting: Physical Therapy

## 2019-08-12 ENCOUNTER — Telehealth: Payer: Self-pay | Admitting: Family Medicine

## 2019-08-12 LAB — CBC
Hematocrit: 35.7 % — ABNORMAL LOW (ref 37.5–51.0)
Hemoglobin: 11.8 g/dL — ABNORMAL LOW (ref 13.0–17.7)
MCH: 29.9 pg (ref 26.6–33.0)
MCHC: 33.1 g/dL (ref 31.5–35.7)
MCV: 91 fL (ref 79–97)
Platelets: 301 x10E3/uL (ref 150–450)
RBC: 3.94 x10E6/uL — ABNORMAL LOW (ref 4.14–5.80)
RDW: 15.9 % — ABNORMAL HIGH (ref 11.6–15.4)
WBC: 4.5 x10E3/uL (ref 3.4–10.8)

## 2019-08-12 LAB — BASIC METABOLIC PANEL WITH GFR
BUN/Creatinine Ratio: 13 (ref 10–24)
BUN: 11 mg/dL (ref 8–27)
CO2: 24 mmol/L (ref 20–29)
Calcium: 9.2 mg/dL (ref 8.6–10.2)
Chloride: 102 mmol/L (ref 96–106)
Creatinine, Ser: 0.86 mg/dL (ref 0.76–1.27)
GFR calc Af Amer: 99 mL/min/1.73
GFR calc non Af Amer: 86 mL/min/1.73
Glucose: 109 mg/dL — ABNORMAL HIGH (ref 65–99)
Potassium: 4.2 mmol/L (ref 3.5–5.2)
Sodium: 141 mmol/L (ref 134–144)

## 2019-08-12 NOTE — Telephone Encounter (Signed)
Informed pt of his results.  Advised him he can stop taking potassium supplementation now.

## 2019-08-17 NOTE — Telephone Encounter (Signed)
Refill request

## 2019-08-18 ENCOUNTER — Ambulatory Visit: Payer: Medicare Other | Admitting: Physical Therapy

## 2019-08-20 ENCOUNTER — Ambulatory Visit: Payer: Medicare Other | Attending: Oncology | Admitting: Physical Therapy

## 2019-08-20 ENCOUNTER — Other Ambulatory Visit: Payer: Self-pay

## 2019-08-20 ENCOUNTER — Encounter: Payer: Self-pay | Admitting: Physical Therapy

## 2019-08-20 DIAGNOSIS — M6281 Muscle weakness (generalized): Secondary | ICD-10-CM | POA: Diagnosis present

## 2019-08-20 DIAGNOSIS — R2689 Other abnormalities of gait and mobility: Secondary | ICD-10-CM | POA: Diagnosis present

## 2019-08-20 DIAGNOSIS — R293 Abnormal posture: Secondary | ICD-10-CM | POA: Diagnosis present

## 2019-08-23 NOTE — Therapy (Signed)
[facility] [address] [place], [place], [address] Phone: [phone]   Fax:  [phone]  Physical Therapy Treatment  Patient Details  Name: [facility] MRN: [id number] Date of Birth: 1945-12-19 Referring Provider (PT): Barrett Shell, MD   Encounter Date: 08/20/2019     08/20/19 1450  PT Visits / Re-Eval  Visit Number 8  Number of Visits 17  Date for PT Re-Evaluation 09/28/19 (written for 90 day POC)  Authorization  Authorization Type Medicare - will need 10th visit PN  PT Time Calculation  PT Start Time 1447  PT Stop Time 1530  PT Time Calculation (min) 43 min  PT - End of Session  Equipment Utilized During Treatment Gait belt  Activity Tolerance Patient limited by fatigue;Patient tolerated treatment well  Behavior During Therapy [facility] for tasks assessed/performed    Past Medical History:  Diagnosis Date  . Acute blood loss anemia   . Hypertension   . Metastatic cancer to bone ([place]) dx'd 2018  . Prostate cancer [facility])     Past Surgical History:  Procedure Laterality Date  . HIP SURGERY Bilateral   . IR IMAGING GUIDED PORT INSERTION  04/22/2018  . LAMINECTOMY N/A 03/09/2019   Procedure: THORACIC SEVEN - THORACIC EIGHT - THORACIC NINE LAMINECTOMY WITH RESECTION OF EPIDURAL TUMOR;  Surgeon: Earnie Larsson, MD;  Location: [facility];  Service: Neurosurgery;  Laterality: N/A;    There were no vitals filed for this visit.     08/20/19 1449  Symptoms/Limitations  Subjective No new complaints. No falls or pain to report. Reports the HEP is going well.  Pertinent History Prostate cancer, metastatic cancer to spine, HTN, cord compression myelopathy (status post epidural compression and decompressive laminectomy completed on March 09, 2019 )  Limitations Walking;Standing;House hold activities  How long can you walk comfortably? down his hallway at home with home health.  Patient Stated Goals wants to feel more  comfortable in the RW, wants to be more independent.  Pain Assessment  Currently in Pain? No/denies  Pain Score 0      08/20/19 1451  Transfers  Transfers Stand to Sit;Sit to Stand  Sit to Stand 4: Min assist;With upper extremity assist;From chair/3-in-1;With armrests  Sit to Stand Details Verbal cues for technique;Verbal cues for sequencing  Stand to Sit 4: Min guard;4: Min assist;With upper extremity assist;To chair/3-in-1;With armrests  Stand to Sit Details (indicate cue type and reason) Tactile cues for weight shifting;Verbal cues for sequencing;Verbal cues for technique;Verbal cues for precautions/safety;Verbal cues for safe use of DME/AE;Manual facilitation for weight shifting  Squat Pivot Transfers 4: Min assist  Squat Pivot Transfer Details (indicate cue type and reason) from wheelchair to/from Scifit with UE assist and cues on form/technique;   Comments multiple stands at parallel bars  High Level Balance  High Level Balance Activities Side stepping  High Level Balance Comments in parallel bars for 3 laps toward each side with min guard to min assist, bil UE support on bars.   Knee/Hip Exercises: Aerobic  Other Aerobic Scifit UE/LE level 3.5 for 4 minutes, rest 1 minute, then 4 more minutes with goal >/= 50 rpm for strengthening and activity tolerance.   Knee/Hip Exercises: Standing  Forward Step Up Both;1 set;Hand Hold: 2;Step Height: 4";Limitations  Forward Step Up Limitations 6 reps each side with cues on form and technique  Other Standing Knee Exercises standing in paralel bars- alternating forward foot taps to 4 inch box x 10 reps each side. min guard assist  for balance.        PT Short Term Goals - 08/10/19 2122      PT SHORT TERM GOAL #1   Title  Pt and pt's caregiver/wife will be independent with initial HEP for LE strengthening and ROM. ALL STGS DUE 07/30/19    Baseline  pt subjectively reports he is performing HEP    Time  4    Period  Weeks    Status  Achieved     Target Date  07/30/19      PT SHORT TERM GOAL #2   Title  Patient will perform squat pivot transfer from w/c <> mat table with min guard in order to decrease caregiver burden.    Baseline  required min-mod A on 08/10/19    Time  4    Period  Weeks    Status  Not Met      PT SHORT TERM GOAL #3   Title  Pt will perform log roll, sidelying to sit with correct mechanics and min A in order to demonstrate safe mechanics for spine and to decrease caregiver burden.    Baseline  did not perfrom on 08/10/19    Time  4    Period  Weeks    Status  Deferred      PT SHORT TERM GOAL #4   Title  Pt will perform sit <> stand from RW with min A from w/c vs. higher mat table in order to demonstrate improved LE strength and improve weight bearing.    Baseline  able to perform sit <> stand from higher mat table with min guard and lower mat table with min A and RW    Time  4    Period  Weeks    Status  Achieved        PT Long Term Goals - 06/30/19 1006      PT LONG TERM GOAL #1   Title  Pt and pt's caregiver/wife will be independent with initial HEP for LE strengthening and ROM. ALL LTGS DUE 08/29/19    Time  8    Period  Weeks    Status  New    Target Date  08/29/19      PT LONG TERM GOAL #2   Title  Patient will ambulate at least 36' with w/c follow and min guard in order to demonstrate improved household mobility.    Baseline  not yet assessed.    Time  8    Period  Weeks    Status  New      PT LONG TERM GOAL #3   Title  Pt will perform sit <> stand from RW from w/c vs. mat table with supervision in order to demo improved functional transfers.    Time  8    Period  Weeks    Status  New      PT LONG TERM GOAL #4   Title  Patient will tolerate static standing x5 minutes at countertop with BUE support in order to improve tolerance for ADLs.    Time  8    Period  Weeks    Status  New      PT LONG TERM GOAL #5   Title  Patient will perform all bed mobility with mod I in order to  decrease caregiver burden.    Time  8    Period  Weeks    Status  New          08/20/19  [place] Today's skilled session continued to focus on strengthening and transfers with rest breaks needed due to fatigue throughout session. VSS with session. The pt is making slow, steady progress toward goals and should benefit from continued PT to progress toward unmet goals.  Personal Factors and Comorbidities Age;Comorbidity 2;Past/Current Experience;Time since onset of injury/illness/exacerbation  Comorbidities Prostate cancer, metastatic cancer to spine, cord compression myelopathy (status post epidural compression and decompressive laminectomy completed on March 09, 2019 )  Examination-Activity Limitations Bed Mobility;Transfers;Stand;Squat;Locomotion Level  Examination-Participation Restrictions Community Activity  Pt will benefit from skilled therapeutic intervention in order to improve on the following deficits Decreased activity tolerance;Decreased balance;Decreased endurance;Decreased range of motion;Decreased mobility;Decreased coordination;Decreased strength;Difficulty walking;Abnormal gait;Postural dysfunction  Stability/Clinical Decision Making Evolving/Moderate complexity  Rehab Potential Fair  PT Frequency 2x / week  PT Duration 8 weeks  PT Treatment/Interventions ADLs/Self Care Home Management;DME Instruction;Gait training;Stair training;Functional mobility training;Neuromuscular re-education;Balance training;Therapeutic exercise;Therapeutic activities;Patient/family education;Wheelchair mobility training;Passive range of motion;Energy conservation  PT Next Visit Plan Monitor HR with session. Continue with standing balance, gait, functional transfers, activity tolerance, and strengthening.  Consulted and Agree with Plan of Care Patient         Patient will benefit from skilled therapeutic intervention in order to improve the following deficits and  impairments:  Decreased activity tolerance, Decreased balance, Decreased endurance, Decreased range of motion, Decreased mobility, Decreased coordination, Decreased strength, Difficulty walking, Abnormal gait, Postural dysfunction  Visit Diagnosis: Muscle weakness (generalized)  Other abnormalities of gait and mobility  Abnormal posture     Problem List Patient Active Problem List   Diagnosis Date Noted  . Hypokalemia 08/11/2019  . Anemia in neoplastic disease 08/11/2019  . Dark urine 08/11/2019  . Sepsis ([place]) 08/03/2019  . Noninfectious colitis 08/03/2019  . Constipation 05/15/2019  . Elevated BUN   . Transaminitis   . Hypoalbuminemia due to protein-calorie malnutrition ([place])   . Steroid-induced hyperglycemia   . Essential hypertension   . Metastatic cancer to spine ([place]) 03/11/2019  . Neuropathic pain   . Benign essential HTN   . Cocaine use 03/10/2019  . Cord compression myelopathy ([place])   . Spinal cord compression ([place]) 03/09/2019  . Prostate cancer metastatic to bone ([place]) 12/22/2018  . Port-A-Cath in place 07/02/2018  . Goals of care, counseling/discussion 01/20/2018  . Right knee pain 01/17/2018  . Prostate cancer ([place]) 01/22/2017  . Prediabetes 03/30/2016  . Positive FIT (fecal immunochemical test) 11/22/2015  . GERD (gastroesophageal reflux disease) 12/21/2014  . Age-related nuclear cataract of both eyes 01/13/2013  . Hypertensive retinopathy 01/13/2013  . Essential (primary) hypertension 11/25/2012  . Pure hypercholesterolemia 11/25/2012  . History of total hip replacement 08/28/2010  . Tobacco dependence syndrome 03/22/2006    [place] Ora, PTA, [facility] Outpatient Neuro [facility] [address], [place], [place] [address] [phone] 08/23/19, 8:50 PM   Name: Shabazz Mckey MRN: [id number] Date of Birth: 07/07/1946

## 2019-08-24 ENCOUNTER — Ambulatory Visit: Payer: Medicare Other | Admitting: Physical Therapy

## 2019-08-26 ENCOUNTER — Ambulatory Visit: Payer: Medicare Other | Admitting: Physical Therapy

## 2019-09-05 NOTE — Addendum Note (Signed)
Encounter addended by: Tyler Pita, MD on: 09/05/2019 2:33 PM  Actions taken: Medication List reviewed, Problem List reviewed, Allergies reviewed, Visit diagnoses modified, Clinical Note Signed

## 2019-09-05 NOTE — Progress Notes (Signed)
  Radiation Oncology         [phone] ________________________________  Name: Machi Vanpatten MRN: [id number]  Date: 07/03/2019  DOB: 22-Jun-1946  Radium-223 Infusion Note  Diagnosis:  Castration resistant prostate cancer with painful bone involvement  Current Infusion:    6  Planned Infusions:  6  Narrative: Mr. Nahome Hillier presented to nuclear medicine for treatment. His most recent blood counts were reviewed.  He remains a good candidate to proceed with Ra-223.  The patient was situated in an infusion suite with a contact barrier placed under his arm. Intravenous access was established, using sterile technique, and a normal saline infusion from a syringe was started.  Micro-dosimetry:  The prescribed radiation activity was assayed and confirmed to be within specified tolerance.  Special Treatment Procedure - Infusion:  The nuclear medicine technologist and I personally verified the dose activity to be delivered as specified in the written directive, and verified the patient identification via 2 separate methods.  The syringe containing the dose was attached to an intravenous access and the dose delivered over a minute. No complications were noted.  The total administered dose was 114.8 microcuries.   A saline flush of the line and the syringe that contained the isotope was then performed.  The residual radioactivity in the syringe was 4.02 microcuries.  Pressure was applied to the venipuncture site, and a compression bandage placed.   Radiation Safety personnel were present to perform the discharge survey, as detailed on their documentation.   After a short period of observation, the patient had his IV removed.  Impression:  The patient tolerated his infusion relatively well.  Plan:  The patient will return in one month for ongoing care.    ________________________________  Sheral Apley. Tammi Klippel, M.D.

## 2019-09-16 ENCOUNTER — Ambulatory Visit: Payer: Medicare Other | Attending: Oncology | Admitting: Physical Therapy

## 2019-09-16 ENCOUNTER — Other Ambulatory Visit: Payer: Self-pay

## 2019-09-16 ENCOUNTER — Encounter: Payer: Self-pay | Admitting: Physical Therapy

## 2019-09-16 DIAGNOSIS — R2689 Other abnormalities of gait and mobility: Secondary | ICD-10-CM | POA: Diagnosis present

## 2019-09-16 DIAGNOSIS — M6281 Muscle weakness (generalized): Secondary | ICD-10-CM | POA: Diagnosis present

## 2019-09-16 DIAGNOSIS — R293 Abnormal posture: Secondary | ICD-10-CM | POA: Diagnosis present

## 2019-09-17 NOTE — Therapy (Addendum)
[facility] [address] [place], [place], [address] Phone: [phone]   Fax:  [phone]  Physical Therapy Treatment/Re-Cert  Patient Details  Name: Dennis Fischer MRN: [id number] Date of Birth: [birth year] Referring Provider (PT): Barrett Shell, MD   Encounter Date: 09/16/2019    09/16/19 1237  PT Visits / Re-Eval  Visit Number 9  Number of Visits 17  Date for PT Re-Evaluation 11/19/19 (written for 90 day POC)  Authorization  Authorization Type Medicare - will need 10th visit PN  PT Time Calculation  PT Start Time 1232  PT Stop Time 1315  PT Time Calculation (min) 43 min  PT - End of Session  Equipment Utilized During Treatment Gait belt  Activity Tolerance Patient limited by fatigue;Patient tolerated treatment well  Behavior During Therapy [facility] for tasks assessed/performed    Past Medical History:  Diagnosis Date  . Acute blood loss anemia   . Hypertension   . Metastatic cancer to bone ([place]) dx'd 2018  . Prostate cancer [facility])     Past Surgical History:  Procedure Laterality Date  . HIP SURGERY Bilateral   . IR IMAGING GUIDED PORT INSERTION  04/22/2018  . LAMINECTOMY N/A 03/09/2019   Procedure: THORACIC SEVEN - THORACIC EIGHT - THORACIC NINE LAMINECTOMY WITH RESECTION OF EPIDURAL TUMOR;  Surgeon: Earnie Larsson, MD;  Location: [facility];  Service: Neurosurgery;  Laterality: N/A;    There were no vitals filed for this visit.     09/16/19 1235  Symptoms/Limitations  Subjective No falls. Pt wanting to know what he has to do to see the doctors next door to make his neuropathy go away.  Pertinent History Prostate cancer, metastatic cancer to spine, HTN, cord compression myelopathy (status post epidural compression and decompressive laminectomy completed on March 09, 2019 )  Limitations Walking;Standing;House hold activities  How long can you walk comfortably? down his hallway at home with home health.  Patient  Stated Goals wants to feel more comfortable in the RW, wants to be more independent.  Pain Assessment  Currently in Pain? Yes  Pain Score 5  Pain Location Arm  Pain Orientation Right;Left  Pain Descriptors / Indicators Sore  Pain Onset More than a month ago  Pain Frequency Occasional  Aggravating Factors  lying in certain positions  Pain Relieving Factors stretching helps      09/16/19 1242  Bed Mobility  Bed Mobility Supine to Sit  Supine to Sit Independent  Sit to Supine Independent  Transfers  Transfers Sit to Stand;Stand to Sit;Stand Pivot Transfers  Sit to Stand 4: Min guard;With upper extremity assist;From bed;From chair/3-in-1  Sit to Stand Details Verbal cues for sequencing;Verbal cues for safe use of DME/AE  Stand to Sit 4: Min guard;With upper extremity assist;To bed;To chair/3-in-1  Stand to Sit Details (indicate cue type and reason) Verbal cues for sequencing;Verbal cues for safe use of DME/AE  Stand Pivot Transfers 4: Min guard  Stand Pivot Transfer Details (indicate cue type and reason) with RW from wheelchair to mat table, then from Nustep to wheelchair  Comments able to stand at [facility] with min guard to min assist for 5 minutes with intermittent single UE support.   Ambulation/Gait  Ambulation/Gait Yes  Ambulation/Gait Assistance 4: Min assist (with wheelchair follow)  Ambulation/Gait Assistance Details multimodal cues for more upright posture, increased base of support and increased step length with gait. knees buckling (? which one) x2 with gait needing at least min assist to correct.   Ambulation Distance (  Feet) 32 Feet  Assistive device Rolling walker  Gait Pattern Step-through pattern;Ataxic;Decreased step length - right;Decreased step length - left;Decreased stride length;Decreased trunk rotation;Trunk flexed;Narrow base of support  Ambulation Surface Level;Indoor  Self-Care  Self-Care Other Self-Care Comments  Other Self-Care Comments  pt with question on how  to see the "doctors over there to make the neuropathy go away". Pt is referring to [place] office need door. Discussed his primary MD can refer him to a neurologist as needed. Also discussed that neuropathy does not go away. It can be treated to lessen symptoms only.   Knee/Hip Exercises: Aerobic  Nustep UE/LE level 5 with goal >/=60 steps per minute for 8 minutes for strengthening and activity tolerance         PT Short Term Goals - 08/10/19 2122      PT SHORT TERM GOAL #1   Title  Pt and pt's caregiver/wife will be independent with initial HEP for LE strengthening and ROM. ALL STGS DUE 07/30/19    Baseline  pt subjectively reports he is performing HEP    Time  4    Period  Weeks    Status  Achieved    Target Date  07/30/19      PT SHORT TERM GOAL #2   Title  Patient will perform squat pivot transfer from w/c <> mat table with min guard in order to decrease caregiver burden.    Baseline  required min-mod A on 08/10/19    Time  4    Period  Weeks    Status  Not Met      PT SHORT TERM GOAL #3   Title  Pt will perform log roll, sidelying to sit with correct mechanics and min A in order to demonstrate safe mechanics for spine and to decrease caregiver burden.    Baseline  did not perfrom on 08/10/19    Time  4    Period  Weeks    Status  Deferred      PT SHORT TERM GOAL #4   Title  Pt will perform sit <> stand from RW with min A from w/c vs. higher mat table in order to demonstrate improved LE strength and improve weight bearing.    Baseline  able to perform sit <> stand from higher mat table with min guard and lower mat table with min A and RW    Time  4    Period  Weeks    Status  Achieved        PT Long Term Goals - 09/16/19 1240      PT LONG TERM GOAL #1   Title  Pt and pt's caregiver/wife will be independent with initial HEP for LE strengthening and ROM. ALL LTGS DUE 08/29/19    Baseline  09/16/19: met with current program, will need it to be updated as he progresses     Status  Achieved      PT LONG TERM GOAL #2   Title  Patient will ambulate at least 33' with w/c follow and min guard in order to demonstrate improved household mobility.    Baseline  09/17/19: 32 feet with RW with min assist. Progressing, just not to goal.    Time  --    Period  --    Status  Partially Met      PT LONG TERM GOAL #3   Title  Pt will perform sit <> stand from RW from w/c vs. mat table with supervision in  order to demo improved functional transfers.    Baseline  09/17/19: min guard assist for safety continues to be needed    Time  --    Period  --    Status  Not Met      PT LONG TERM GOAL #4   Title  Patient will tolerate static standing x5 minutes at countertop with BUE support in order to improve tolerance for ADLs.    Baseline  09/17/19: met in session today    Time  --    Period  --    Status  Achieved      PT LONG TERM GOAL #5   Title  Patient will perform all bed mobility with mod I in order to decrease caregiver burden.    Baseline  09/17/19: met in session today    Time  --    Period  --    Status  Achieved        Updated/ongoing LTGs for re-cert:    PT Long Term Goals - 09/16/19 1240      PT LONG TERM GOAL #1   Title  Pt and pt's caregiver/wife will be independent with initial HEP for LE strengthening and ROM. ALL LTGS DUE 10/18/19    Baseline  09/16/19: met with current program, will need it to be updated as he progresses    Time  4    Period  Weeks    Status  On-going    Target Date  10/18/19      PT LONG TERM GOAL #2   Title  Patient will ambulate at least 50' and min guard in order to demonstrate improved household mobility.    Baseline  09/17/19: 32 feet with RW with min assist. Progressing, just not to goal.    Time  4    Period  Weeks    Status  On-going      PT LONG TERM GOAL #3   Title  Pt will perform sit <> stand from RW from w/c vs. mat table with supervision in order to demo improved functional transfers.    Baseline  09/17/19: min guard  assist for safety continues to be needed    Time  4    Period  Weeks    Status  On-going      PT LONG TERM GOAL #4   Title  Patient will undergo further assessment of 5x sit <> stand and goal to be written as appropriate to demonstrate improved functional LE strength.    Baseline  will assess at next session.    Time  4    Period  Weeks    Status  New      PT LONG TERM GOAL #5   Title  --    Baseline  --    Time  --    Period  --    Status  --          09/16/19 1237  Plan  Clinical Impression Statement Today's skilled session focused on progress toward LTGs for anticipated recert by primary PT. Also continued to address strengthening and activity tolerance via Nustep. The pt is making steady progress toward goals and should benefit from contineud PT to progress toward unmet goals. Re-cert: Due to scheduling conflicts and pt needing to cancel a couple appointments due to feeling unwell, pt has not been able to be seen for PT the past couple of weeks. Will continue for 2x week for 4 weeks due to pt making  progress and in order to improve independence, safety, strength, balance, and functional mobility. LTGs updated as appropriate.   Personal Factors and Comorbidities Age;Comorbidity 2;Past/Current Experience;Time since onset of injury/illness/exacerbation  Comorbidities Prostate cancer, metastatic cancer to spine, cord compression myelopathy (status post epidural compression and decompressive laminectomy completed on March 09, 2019 )  Examination-Activity Limitations Bed Mobility;Transfers;Stand;Squat;Locomotion Level  Examination-Participation Restrictions Community Activity  Pt will benefit from skilled therapeutic intervention in order to improve on the following deficits Decreased activity tolerance;Decreased balance;Decreased endurance;Decreased range of motion;Decreased mobility;Decreased coordination;Decreased strength;Difficulty walking;Abnormal gait;Postural dysfunction   Stability/Clinical Decision Making Evolving/Moderate complexity  Rehab Potential Fair  PT Frequency 2x / week  PT Duration 4 weeks  PT Treatment/Interventions ADLs/Self Care Home Management;DME Instruction;Gait training;Stair training;Functional mobility training;Neuromuscular re-education;Balance training;Therapeutic exercise;Therapeutic activities;Patient/family education;Wheelchair mobility training;Passive range of motion;Energy conservation  PT Next Visit Plan 10th visit progress note due;Monitor HR with session. Continue with standing balance, gait, functional transfers, activity tolerance, and strengthening.  Consulted and Agree with Plan of Care Patient         Patient will benefit from skilled therapeutic intervention in order to improve the following deficits and impairments:  Decreased activity tolerance, Decreased balance, Decreased endurance, Decreased range of motion, Decreased mobility, Decreased coordination, Decreased strength, Difficulty walking, Abnormal gait, Postural dysfunction  Visit Diagnosis: Muscle weakness (generalized)  Other abnormalities of gait and mobility  Abnormal posture     Problem List Patient Active Problem List   Diagnosis Date Noted  . Hypokalemia 08/11/2019  . Anemia in neoplastic disease 08/11/2019  . Dark urine 08/11/2019  . Sepsis ([place]) 08/03/2019  . Noninfectious colitis 08/03/2019  . Constipation 05/15/2019  . Elevated BUN   . Transaminitis   . Hypoalbuminemia due to protein-calorie malnutrition ([place])   . Steroid-induced hyperglycemia   . Essential hypertension   . Metastatic cancer to spine ([place]) 03/11/2019  . Neuropathic pain   . Benign essential HTN   . Cocaine use 03/10/2019  . Cord compression myelopathy ([place])   . Spinal cord compression ([place]) 03/09/2019  . Prostate cancer metastatic to bone ([place]) 12/22/2018  . Port-A-Cath in place 07/02/2018  . Goals of care, counseling/discussion 01/20/2018  . Right knee pain  01/17/2018  . Prostate cancer ([place]) 01/22/2017  . Prediabetes 03/30/2016  . Positive FIT (fecal immunochemical test) 11/22/2015  . GERD (gastroesophageal reflux disease) 12/21/2014  . Age-related nuclear cataract of both eyes 01/13/2013  . Hypertensive retinopathy 01/13/2013  . Essential (primary) hypertension 11/25/2012  . Pure hypercholesterolemia 11/25/2012  . History of total hip replacement 08/28/2010  . Tobacco dependence syndrome 03/22/2006    Willow Ora, PTA, [facility] [address] [place], [place] [address] [phone] 09/18/19, 12:18 AM   Name: Dennis Fischer MRN: [id number] Date of Birth: 02-02-1946

## 2019-09-18 ENCOUNTER — Ambulatory Visit: Payer: Medicare Other | Admitting: Physical Therapy

## 2019-09-20 NOTE — Addendum Note (Signed)
Addended by: Arliss Journey on: 09/20/2019 06:31 PM   Modules accepted: Orders

## 2019-09-21 ENCOUNTER — Ambulatory Visit: Payer: Medicare Other | Admitting: Physical Therapy

## 2019-09-21 ENCOUNTER — Encounter: Payer: Self-pay | Admitting: Physical Therapy

## 2019-09-21 ENCOUNTER — Other Ambulatory Visit: Payer: Self-pay

## 2019-09-21 DIAGNOSIS — R2689 Other abnormalities of gait and mobility: Secondary | ICD-10-CM

## 2019-09-21 DIAGNOSIS — M6281 Muscle weakness (generalized): Secondary | ICD-10-CM | POA: Diagnosis not present

## 2019-09-21 DIAGNOSIS — R293 Abnormal posture: Secondary | ICD-10-CM

## 2019-09-21 NOTE — Therapy (Signed)
[name] [address] [place], [place], [address] Phone: [phone]   Fax:  [phone]  Physical Therapy Treatment/10th Visit Progress Note  Patient Details  Name: Dennis Fischer MRN: [id number] Date of Birth: Nov 13, 1945 Referring Provider (PT): Barrett Shell, MD  Progress Note Reporting Period 06/29/19 to 09/21/19   See note below for Objective Data and Assessment of Progress/Goals.       Encounter Date: 09/21/2019  PT End of Session - 09/21/19 1225    Visit Number  10    Number of Visits  17    Date for PT Re-Evaluation  11/19/19   written for 90 day POC   Authorization Type  Medicare - will need 10th visit PN    PT Start Time  1105    PT Stop Time  1148    PT Time Calculation (min)  43 min    Equipment Utilized During Treatment  Gait belt    Activity Tolerance  Patient tolerated treatment well    Behavior During Therapy  WFL for tasks assessed/performed       Past Medical History:  Diagnosis Date  . Acute blood loss anemia   . Hypertension   . Metastatic cancer to bone ([place]) dx'd 2018  . Prostate cancer [facility])     Past Surgical History:  Procedure Laterality Date  . HIP SURGERY Bilateral   . IR IMAGING GUIDED PORT INSERTION  04/22/2018  . LAMINECTOMY N/A 03/09/2019   Procedure: THORACIC SEVEN - THORACIC EIGHT - THORACIC NINE LAMINECTOMY WITH RESECTION OF EPIDURAL TUMOR;  Surgeon: Earnie Larsson, MD;  Location: [facility];  Service: Neurosurgery;  Laterality: N/A;    There were no vitals filed for this visit.  Subjective Assessment - 09/21/19 1108    Subjective  No falls. Had an almost fall getting up from his w/c to the bed - did not have his RW with him. Felt good after last session.    Pertinent History  Prostate cancer, metastatic cancer to spine, HTN, cord compression myelopathy (status post epidural compression and decompressive laminectomy completed on March 09, 2019 )    Limitations   Walking;Standing;House hold activities    How long can you walk comfortably?  down his hallway at home with home health.    Patient Stated Goals  wants to feel more comfortable in the RW, wants to be more independent.    Currently in Pain?  No/denies    Pain Onset  More than a month ago                       [facility] Adult PT Treatment/Exercise - 09/21/19 0001      Transfers   Transfers  Sit to Stand;Stand to Sit    Sit to Stand  4: Min guard;With upper extremity assist;From bed;From chair/3-in-1    Sit to Stand Details  Verbal cues for sequencing;Verbal cues for safe use of DME/AE    Sit to Stand Details (indicate cue type and reason)  cues for anterior weight shift and foot placement to stand in order to prevent initial posterior weight shifting on heels and bracing BLEs against mat table upon standing - 1 x 5 reps (in addition to 5x sit to stand)    Five time sit to stand comments   40.06 seconds from elevated mat table.     Stand to Sit  4: Min guard;With upper extremity assist    Stand to Sit Details (indicate cue type and  reason)  Verbal cues for technique;Verbal cues for sequencing;Verbal cues for precautions/safety;Other (comment)   pt lacking eccentric control    Stand Pivot Transfers  4: Min guard    Stand Pivot Transfer Details (indicate cue type and reason)  from w/c to mat table with RW x 2 reps      Ambulation/Gait   Ambulation/Gait  Yes    Ambulation/Gait Assistance  4: Min assist;Other (comment)   close w/c follow for safety.    Ambulation/Gait Assistance Details  First bout of gait pt demonstrated a more narrow BOS with episodes of scissoring LLE - cues to stay close to RW and to look forward 10'. During 2nd bout of gait - placed theraband around front RW's legs anteriorly with visual cueing for step width B. Pt able to demonstrate an improved gait pattern with a wider BOS and less scissoring with a visual cue - pt did have a couple episodes of scissoring only  when performing turns around the track in the gym. Practiced 2 x 30' of turns with staggered "gates" of cones placed 5' apart to weave in and out of, pt requiring additional verbal cues throughout for step width and length as well as keeping RW close by.     Ambulation Distance (Feet)  37 Feet   x1, 74' x 1   Assistive device  Rolling walker    Gait Pattern  Step-through pattern;Ataxic;Decreased step length - right;Decreased step length - left;Decreased stride length;Decreased trunk rotation;Trunk flexed;Narrow base of support;Scissoring    Ambulation Surface  Level;Indoor      Exercises   Other Exercises   seated in w/c 2 x 10 reps seated hip ABD with green theraband - cues for technique, provided pt with theraband for home use (as pt misplaced other one)             PT Education - 09/21/19 1225    Education Details  gait training, sit <> stand technique, continue with current HEP (provided pt with green theraband as pt has lost his)    Person(s) Educated  Patient    Methods  Explanation;Demonstration    Comprehension  Verbalized understanding;Returned demonstration;Need further instruction       PT Short Term Goals - 08/10/19 2122      PT SHORT TERM GOAL #1   Title  Pt and pt's caregiver/wife will be independent with initial HEP for LE strengthening and ROM. ALL STGS DUE 07/30/19    Baseline  pt subjectively reports he is performing HEP    Time  4    Period  Weeks    Status  Achieved    Target Date  07/30/19      PT SHORT TERM GOAL #2   Title  Patient will perform squat pivot transfer from w/c <> mat table with min guard in order to decrease caregiver burden.    Baseline  required min-mod A on 08/10/19    Time  4    Period  Weeks    Status  Not Met      PT SHORT TERM GOAL #3   Title  Pt will perform log roll, sidelying to sit with correct mechanics and min A in order to demonstrate safe mechanics for spine and to decrease caregiver burden.    Baseline  did not perfrom  on 08/10/19    Time  4    Period  Weeks    Status  Deferred      PT SHORT TERM GOAL #4  Title  Pt will perform sit <> stand from RW with min A from w/c vs. higher mat table in order to demonstrate improved LE strength and improve weight bearing.    Baseline  able to perform sit <> stand from higher mat table with min guard and lower mat table with min A and RW    Time  4    Period  Weeks    Status  Achieved        PT Long Term Goals - 09/21/19 1403      PT LONG TERM GOAL #1   Title  Pt and pt's caregiver/wife will be independent with initial HEP for LE strengthening and ROM. ALL LTGS DUE 10/18/19    Baseline  09/16/19: met with current program, will need it to be updated as he progresses    Time  4    Period  Weeks    Status  On-going      PT LONG TERM GOAL #2   Title  Patient will ambulate at least 50' and min guard in order to demonstrate improved household mobility.    Baseline  09/17/19: 32 feet with RW with min assist. Progressing, just not to goal.    Time  4    Period  Weeks    Status  On-going      PT LONG TERM GOAL #3   Title  Pt will perform sit <> stand from RW from w/c vs. mat table with supervision in order to demo improved functional transfers.    Baseline  09/17/19: min guard assist for safety continues to be needed    Time  4    Period  Weeks    Status  On-going      PT LONG TERM GOAL #4   Title  Patient will perform 5x sit <> stands in 33 seconds or less from elevated mat table with single UE support in order to demo improved functional LE strength.    Baseline  40.06 seconds from elevated mat table with single UE support on mat and hand on RW    Time  4    Period  Weeks    Status  New            Plan - 09/21/19 1429    Clinical Impression Statement  10th visit progress note: Assessed 5x sit to stand with pt performing in 40.06 seconds from elevated mat table with single UE support on mat table. Pt continues to need verbal cueing for proper sit <> stand  technique for forward weight shifting to prevent bracing of BLE on mat initially in standing. Pt able to ambulate approx.. 115' throughout today's session (broken up into separate bouts due to fatigue). Pt initially demonstrated increased scissoring with LLE - with use of theraband around RW as visual cue for step width and length, pt able to demonstrate a more wide BOS and only had mild episodes of scissoring when performing turns. Pt's HR elevated to 125-130 bpm after gait bouts - able to decrease to 100-105 bpm after seated rest. Pt is progressing well - will continue to progress towards LTGs.    Personal Factors and Comorbidities  Age;Comorbidity 2;Past/Current Experience;Time since onset of injury/illness/exacerbation    Comorbidities  Prostate cancer, metastatic cancer to spine, cord compression myelopathy (status post epidural compression and decompressive laminectomy completed on March 09, 2019 )    Examination-Activity Limitations  Bed Mobility;Transfers;Stand;Squat;Locomotion Level    Examination-Participation Restrictions  Community Activity    Stability/Clinical  Decision Making  Evolving/Moderate complexity    Rehab Potential  Fair    PT Frequency  2x / week    PT Duration  4 weeks    PT Treatment/Interventions  ADLs/Self Care Home Management;DME Instruction;Gait training;Stair training;Functional mobility training;Neuromuscular re-education;Balance training;Therapeutic exercise;Therapeutic activities;Patient/family education;Wheelchair mobility training;Passive range of motion;Energy conservation    PT Next Visit Plan  Monitor HR with session. Continue with standing balance, gait, functional transfers, activity tolerance, and strengthening. use of theraband for more wide BOS with gait.    Consulted and Agree with Plan of Care  Patient       Patient will benefit from skilled therapeutic intervention in order to improve the following deficits and impairments:  Decreased activity tolerance,  Decreased balance, Decreased endurance, Decreased range of motion, Decreased mobility, Decreased coordination, Decreased strength, Difficulty walking, Abnormal gait, Postural dysfunction  Visit Diagnosis: Muscle weakness (generalized)  Other abnormalities of gait and mobility  Abnormal posture     Problem List Patient Active Problem List   Diagnosis Date Noted  . Hypokalemia 08/11/2019  . Anemia in neoplastic disease 08/11/2019  . Dark urine 08/11/2019  . Sepsis ([place]) 08/03/2019  . Noninfectious colitis 08/03/2019  . Constipation 05/15/2019  . Elevated BUN   . Transaminitis   . Hypoalbuminemia due to protein-calorie malnutrition ([place])   . Steroid-induced hyperglycemia   . Essential hypertension   . Metastatic cancer to spine ([place]) 03/11/2019  . Neuropathic pain   . Benign essential HTN   . Cocaine use 03/10/2019  . Cord compression myelopathy ([place])   . Spinal cord compression ([place]) 03/09/2019  . Prostate cancer metastatic to bone ([place]) 12/22/2018  . Port-A-Cath in place 07/02/2018  . Goals of care, counseling/discussion 01/20/2018  . Right knee pain 01/17/2018  . Prostate cancer ([place]) 01/22/2017  . Prediabetes 03/30/2016  . Positive FIT (fecal immunochemical test) 11/22/2015  . GERD (gastroesophageal reflux disease) 12/21/2014  . Age-related nuclear cataract of both eyes 01/13/2013  . Hypertensive retinopathy 01/13/2013  . Essential (primary) hypertension 11/25/2012  . Pure hypercholesterolemia 11/25/2012  . History of total hip replacement 08/28/2010  . Tobacco dependence syndrome 03/22/2006    Arliss Journey, PT, DPT  09/21/2019, 2:29 PM  [facility] [address] [place], [place], [address] Phone: [phone]   Fax:  [phone]  Name: Jhace Fennell MRN: [id number] Date of Birth: 10-20-1945

## 2019-09-23 ENCOUNTER — Ambulatory Visit: Payer: Medicare Other | Admitting: Physical Therapy

## 2019-09-25 ENCOUNTER — Other Ambulatory Visit: Payer: Medicare Other

## 2019-09-25 ENCOUNTER — Telehealth: Payer: Self-pay

## 2019-09-25 NOTE — Telephone Encounter (Signed)
-----   Message from Wyatt Portela, MD sent at 09/25/2019  1:12 PM EST ----- Regarding: RE: Patient question He is to continue with oral hydration and supportive care using Compazine and Imodium if his diarrhea returns.  If his symptoms worsen over the weekend he will have to go to the emergency department.  Thanks ----- Message ----- From: Teodoro Spray, RN Sent: 09/25/2019  12:37 PM EST To: Wyatt Portela, MD Subject: Patient question                               Patient called office stating he has been throwing up and has diarrhea for the past 3 days.  Patient states he has been able to drink fluids but has not been able to eat anything.  Patient also says his significant other has had the same symptoms and they think "it might be a bug".  Patient states he is feeling better today but still feels weak, but thinks its because he hasn't tried to eat anything today.  Patient states he did take his compazine "once a couple of days ago but I ended up throwing it up".  [facility] is closed today.   Please advise.

## 2019-09-25 NOTE — Telephone Encounter (Signed)
Patient informed of message below from Dr. Alen Blew and verbalized understanding.  Patient advised to go to the Emergency Department if symptoms worsen over the weekend.

## 2019-09-28 ENCOUNTER — Emergency Department (HOSPITAL_COMMUNITY)
Admission: EM | Admit: 2019-09-28 | Discharge: 2019-09-28 | Disposition: A | Discharge status: Home / Self Care | Payer: Medicare Other | Attending: Emergency Medicine | Admitting: Emergency Medicine

## 2019-09-28 ENCOUNTER — Ambulatory Visit: Payer: Medicare Other | Admitting: Physical Therapy

## 2019-09-28 DIAGNOSIS — R21 Rash and other nonspecific skin eruption: Secondary | ICD-10-CM | POA: Diagnosis present

## 2019-09-28 DIAGNOSIS — I10 Essential (primary) hypertension: Secondary | ICD-10-CM | POA: Diagnosis not present

## 2019-09-28 DIAGNOSIS — Z8546 Personal history of malignant neoplasm of prostate: Secondary | ICD-10-CM | POA: Insufficient documentation

## 2019-09-28 DIAGNOSIS — B029 Zoster without complications: Secondary | ICD-10-CM | POA: Insufficient documentation

## 2019-09-28 DIAGNOSIS — Z87891 Personal history of nicotine dependence: Secondary | ICD-10-CM | POA: Diagnosis not present

## 2019-09-28 DIAGNOSIS — F1290 Cannabis use, unspecified, uncomplicated: Secondary | ICD-10-CM | POA: Insufficient documentation

## 2019-09-28 DIAGNOSIS — Z79899 Other long term (current) drug therapy: Secondary | ICD-10-CM | POA: Insufficient documentation

## 2019-09-28 MED ORDER — HYDROCODONE-ACETAMINOPHEN 5-325 MG PO TABS: 1.0000 | ORAL_TABLET | ORAL | 0 refills | Status: DC | PRN

## 2019-09-28 MED ORDER — VALACYCLOVIR HCL 1 G PO TABS: 1000.0000 mg | ORAL_TABLET | Freq: Three times a day (TID) | ORAL | 0 refills | Status: AC

## 2019-09-28 NOTE — ED Triage Notes (Signed)
Pt BIB [facility] w/ c/o a rash that started on neck and shoulders that has blistered and spread to upper back. Unsure about shingles vaccine. VS stable, NAD noted, pt A&Ox4.

## 2019-09-28 NOTE — ED Provider Notes (Signed)
[facility] EMERGENCY DEPARTMENT Provider Note   CSN: [id number] Arrival date & time: 09/28/19  1127     History Chief Complaint  Patient presents with  . Rash  . Blister    Dennis Fischer is a 74 y.o. male.  HPI He is here for evaluation of a rash, which started 3 days ago.  Complains of rash causing pain and itching.  He denies fever, chills, shortness of breath, chest pain, weakness or dizziness.  There are no other known modifying factors.    Past Medical History:  Diagnosis Date  . Acute blood loss anemia   . Hypertension   . Metastatic cancer to bone ([place]) dx'd 2018  . Prostate cancer [facility])     Patient Active Problem List   Diagnosis Date Noted  . Hypokalemia 08/11/2019  . Anemia in neoplastic disease 08/11/2019  . Dark urine 08/11/2019  . Sepsis ([place]) 08/03/2019  . Noninfectious colitis 08/03/2019  . Constipation 05/15/2019  . Elevated BUN   . Transaminitis   . Hypoalbuminemia due to protein-calorie malnutrition ([place])   . Steroid-induced hyperglycemia   . Essential hypertension   . Metastatic cancer to spine ([place]) 03/11/2019  . Neuropathic pain   . Benign essential HTN   . Cocaine use 03/10/2019  . Cord compression myelopathy ([place])   . Spinal cord compression ([place]) 03/09/2019  . Prostate cancer metastatic to bone ([place]) 12/22/2018  . Port-A-Cath in place 07/02/2018  . Goals of care, counseling/discussion 01/20/2018  . Right knee pain 01/17/2018  . Prostate cancer ([place]) 01/22/2017  . Prediabetes 03/30/2016  . Positive FIT (fecal immunochemical test) 11/22/2015  . GERD (gastroesophageal reflux disease) 12/21/2014  . Age-related nuclear cataract of both eyes 01/13/2013  . Hypertensive retinopathy 01/13/2013  . Essential (primary) hypertension 11/25/2012  . Pure hypercholesterolemia 11/25/2012  . History of total hip replacement 08/28/2010  . Tobacco dependence syndrome 03/22/2006    Past Surgical History:  Procedure Laterality Date   . HIP SURGERY Bilateral   . IR IMAGING GUIDED PORT INSERTION  04/22/2018  . LAMINECTOMY N/A 03/09/2019   Procedure: THORACIC SEVEN - THORACIC EIGHT - THORACIC NINE LAMINECTOMY WITH RESECTION OF EPIDURAL TUMOR;  Surgeon: Earnie Larsson, MD;  Location: [facility];  Service: Neurosurgery;  Laterality: N/A;       Family History  Problem Relation Age of Onset  . Prostate cancer Father     Social History   Tobacco Use  . Smoking status: Former Smoker    Packs/day: 0.25    Years: 50.00    Pack years: 12.50    Types: Cigarettes    Quit date: 09/10/2014    Years since quitting: 5.0  . Smokeless tobacco: Never Used  . Tobacco comment: Reports smoking only when he drank.  Substance Use Topics  . Alcohol use: Yes    Comment: Once every 2 weeks.   . Drug use: Yes    Types: Marijuana    Comment: Medical     Home Medications Prior to Admission medications   Medication Sig Start Date End Date Taking? Authorizing Provider  acetaminophen (TYLENOL) 325 MG tablet Take 1-2 tablets (325-650 mg total) by mouth every 4 (four) hours as needed for mild pain. 03/27/19   Love, Ivan Anchors, PA-C  amLODipine (NORVASC) 10 MG tablet TAKE 1 TABLET(10 MG) BY MOUTH DAILY Patient taking differently: Take 10 mg by mouth daily.  02/23/19   [name], MD  CALCIUM 500/D 500-200 MG-UNIT tablet TAKE 1 TABLET BY MOUTH TWICE DAILY Patient  taking differently: Take 1 tablet by mouth daily with breakfast.  05/21/19   Wyatt Portela, MD  carvedilol (COREG) 12.5 MG tablet TAKE 1 TABLET BY MOUTH TWICE DAILY WITH A MEAL Patient taking differently: Take 12.5 mg by mouth 2 (two) times daily with a meal.  05/26/19   [name], MD  cyclobenzaprine (FLEXERIL) 10 MG tablet TAKE 1 TABLET BY MOUTH THREE TIMES DAILY AS NEEDED FOR MUSCLE SPASMS Patient taking differently: Take 10 mg by mouth 3 (three) times daily as needed for muscle spasms.  07/17/19   [name], MD  gabapentin (NEURONTIN) 300 MG capsule TAKE 1 CAPSULE(300 MG) BY MOUTH  AT BEDTIME 08/18/19   Wyatt Portela, MD  Multiple Vitamin (MULTIVITAMIN WITH MINERALS) TABS tablet Take 1 tablet by mouth daily.    [provider]  pantoprazole (PROTONIX) 40 MG tablet Take 1 tablet (40 mg total) by mouth daily. 03/12/19   Anderson, Chelsey L, DO  polyethylene glycol (MIRALAX / GLYCOLAX) 17 g packet Take 17 g by mouth daily as needed for mild constipation. 07/31/19   Wyatt Portela, MD  potassium chloride SA (KLOR-CON) 20 MEQ tablet Take 1 tablet (20 mEq total) by mouth daily for 3 days. Patient reports has potassium pills at home. 08/06/19 08/09/19  Florencia Reasons, MD  prochlorperazine (COMPAZINE) 10 MG tablet TAKE 1 TABLET BY MOUTH EVERY 6 HOURS AS NEEDED FOR NAUSEA OR VOMITING Patient taking differently: Take 10 mg by mouth every 6 (six) hours as needed for nausea or vomiting.  06/11/18   Wyatt Portela, MD  senna-docusate (SENOKOT-S) 8.6-50 MG tablet Take 2 tablets by mouth 2 (two) times daily. Patient not taking: Reported on 08/03/2019 07/31/19   Wyatt Portela, MD  simvastatin (ZOCOR) 20 MG tablet Take 1 tablet (20 mg total) by mouth daily. 05/26/19   [name], MD    Allergies    Lisinopril, Cat hair extract, Mangifera indica, Mango flavor, Other, Peanut-containing drug products, Pistachio nut (diagnostic), and Sunflower oil  Review of Systems   Review of Systems  All other systems reviewed and are negative.   Physical Exam Updated Vital Signs SpO2 99%   Physical Exam Vitals and nursing note reviewed.  Constitutional:      General: He is in acute distress (He is uncomfortable).     Appearance: He is well-developed. He is not ill-appearing, toxic-appearing or diaphoretic.  HENT:     Head: Normocephalic and atraumatic.     Right Ear: External ear normal.     Left Ear: External ear normal.  Eyes:     Conjunctiva/sclera: Conjunctivae normal.     Pupils: Pupils are equal, round, and reactive to light.  Neck:     Trachea: Phonation normal.    Cardiovascular:     Rate and Rhythm: Normal rate.  Pulmonary:     Effort: Pulmonary effort is normal. No respiratory distress.     Breath sounds: No stridor.  Abdominal:     General: There is no distension.  Musculoskeletal:        General: Normal range of motion.     Cervical back: Normal range of motion and neck supple.  Skin:    General: Skin is warm and dry.     Comments: Vesicular rash, on red base, left T2-4 dermatomes.  Rash does not extend past the left shoulder.  Neurological:     Mental Status: He is alert and oriented to person, place, and time.     Cranial  Nerves: No cranial nerve deficit.     Sensory: No sensory deficit.     Motor: No abnormal muscle tone.     Coordination: Coordination normal.  Psychiatric:        Mood and Affect: Mood normal.        Behavior: Behavior normal.        Thought Content: Thought content normal.        Judgment: Judgment normal.     ED Results / Procedures / Treatments   Labs (all labs ordered are listed, but only abnormal results are displayed) Labs Reviewed - No data to display  EKG None  Radiology No results found.  Procedures Procedures (including critical care time)  Medications Ordered in ED Medications - No data to display  ED Course  I have reviewed the triage vital signs and the nursing notes.  Pertinent labs & imaging results that were available during my care of the patient were reviewed by me and considered in my medical decision making (see chart for details).    MDM Rules/Calculators/A&P                       Patient Vitals for the past 24 hrs:  SpO2  09/28/19 1138 99 %    12:16 PM Reevaluation with update and discussion. After initial assessment and treatment, an updated evaluation reveals no change in clinical status, findings discussed with the patient and all questions were answered. Daleen Bo   Medical Decision Making: Evaluation is consistent with shingles, left thorax T2- 4.  No evidence  for systemic reaction.  No indication for further evaluation.  CRITICAL CARE-no Performed by: Daleen Bo  Nursing Notes Reviewed/ Care Coordinated Applicable Imaging Reviewed Interpretation of Laboratory Data incorporated into ED treatment  The patient appears reasonably screened and/or stabilized for discharge and I doubt any other medical condition or other [facility] requiring further screening, evaluation, or treatment in the ED at this time prior to discharge.  Plan: Home Medications-continue usual; Home Treatments-symptomatic treatment as needed; return here if the recommended treatment, does not improve the symptoms; Recommended follow up-PCP, as needed   Final Clinical Impression(s) / ED Diagnoses Final diagnoses:  None    Rx / DC Orders ED Discharge Orders    None       Daleen Bo, MD 09/28/19 1217

## 2019-09-30 ENCOUNTER — Ambulatory Visit: Payer: Medicare Other | Admitting: Physical Therapy

## 2019-10-02 ENCOUNTER — Inpatient Hospital Stay: Payer: Medicare Other | Attending: Oncology

## 2019-10-02 ENCOUNTER — Other Ambulatory Visit: Payer: Self-pay

## 2019-10-02 ENCOUNTER — Inpatient Hospital Stay (HOSPITAL_BASED_OUTPATIENT_CLINIC_OR_DEPARTMENT_OTHER): Payer: Medicare Other | Admitting: Oncology

## 2019-10-02 ENCOUNTER — Inpatient Hospital Stay: Payer: Medicare Other

## 2019-10-02 VITALS — BP 147/86 | HR 95 | Temp 98.3°F | Resp 18 | Ht 72.0 in | Wt 157.2 lb

## 2019-10-02 DIAGNOSIS — Z95828 Presence of other vascular implants and grafts: Secondary | ICD-10-CM

## 2019-10-02 DIAGNOSIS — C61 Malignant neoplasm of prostate: Secondary | ICD-10-CM

## 2019-10-02 DIAGNOSIS — Z923 Personal history of irradiation: Secondary | ICD-10-CM | POA: Insufficient documentation

## 2019-10-02 DIAGNOSIS — C7951 Secondary malignant neoplasm of bone: Secondary | ICD-10-CM | POA: Insufficient documentation

## 2019-10-02 DIAGNOSIS — B029 Zoster without complications: Secondary | ICD-10-CM | POA: Diagnosis not present

## 2019-10-02 DIAGNOSIS — D630 Anemia in neoplastic disease: Secondary | ICD-10-CM | POA: Insufficient documentation

## 2019-10-02 DIAGNOSIS — Z79899 Other long term (current) drug therapy: Secondary | ICD-10-CM | POA: Insufficient documentation

## 2019-10-02 LAB — CMP (CANCER CENTER ONLY)
ALT: 15 U/L (ref 0–44)
AST: 15 U/L (ref 15–41)
Albumin: 3.4 g/dL — ABNORMAL LOW (ref 3.5–5.0)
Alkaline Phosphatase: 73 U/L (ref 38–126)
Anion gap: 8 (ref 5–15)
BUN: 11 mg/dL (ref 8–23)
CO2: 25 mmol/L (ref 22–32)
Calcium: 8.2 mg/dL — ABNORMAL LOW (ref 8.9–10.3)
Chloride: 105 mmol/L (ref 98–111)
Creatinine: 0.81 mg/dL (ref 0.61–1.24)
GFR, Est AFR Am: >60 mL/min (ref >60)
GFR, Estimated: >60 mL/min (ref >60)
Glucose, Bld: 94 mg/dL (ref 70–99)
Potassium: 3.6 mmol/L (ref 3.5–5.1)
Sodium: 138 mmol/L (ref 135–145)
Total Bilirubin: 0.3 mg/dL (ref 0.3–1.2)
Total Protein: 6.3 g/dL — ABNORMAL LOW (ref 6.5–8.1)

## 2019-10-02 LAB — CBC WITH DIFFERENTIAL (CANCER CENTER ONLY)
Abs Immature Granulocytes: 0.02 10*3/uL (ref 0.00–0.07)
Basophils Absolute: 0 10*3/uL (ref 0.0–0.1)
Basophils Relative: 0 %
Eosinophils Absolute: 0.1 10*3/uL (ref 0.0–0.5)
Eosinophils Relative: 2 %
HCT: 29.3 % — ABNORMAL LOW (ref 39.0–52.0)
Hemoglobin: 9.7 g/dL — ABNORMAL LOW (ref 13.0–17.0)
Immature Granulocytes: 0 %
Lymphocytes Relative: 40 %
Lymphs Abs: 1.9 10*3/uL (ref 0.7–4.0)
MCH: 30 pg (ref 26.0–34.0)
MCHC: 33.1 g/dL (ref 30.0–36.0)
MCV: 90.7 fL (ref 80.0–100.0)
Monocytes Absolute: 0.4 10*3/uL (ref 0.1–1.0)
Monocytes Relative: 9 %
Neutro Abs: 2.3 10*3/uL (ref 1.7–7.7)
Neutrophils Relative %: 49 %
Platelet Count: 143 10*3/uL — ABNORMAL LOW (ref 150–400)
RBC: 3.23 MIL/uL — ABNORMAL LOW (ref 4.22–5.81)
RDW: 15.9 % — ABNORMAL HIGH (ref 11.5–15.5)
WBC Count: 4.8 10*3/uL (ref 4.0–10.5)
nRBC: 0 % (ref 0.0–0.2)

## 2019-10-02 MED ORDER — LEUPROLIDE ACETATE (4 MONTH) 30 MG ~~LOC~~ KIT
30.0000 mg | PACK | Freq: Once | SUBCUTANEOUS | Status: AC
  Administered 2019-10-02: 30 mg via SUBCUTANEOUS
  Filled 2019-10-02: qty 30

## 2019-10-02 NOTE — Progress Notes (Signed)
Hematology and Oncology Follow Up Visit  Dennis Fischer [id number] 10/20/1945 74 y.o. 10/02/2019 9:56 AM [name] [name], MDKim, [name], MD   Principle Diagnosis: 74 year old man with advanced prostate cancer with disease to the bone diagnosed in 2018.  He has castration-resistant at this time.  Prior Therapy: He received definitive therapy using radiation therapy and androgen deprivation in 2016.  He developed advanced disease with pelvic adenopathy and was treated with androgen deprivation while was living in [place].  He subsequently developed castration resistant disease.  Zytiga 1000 mg daily restarted on 03/14/2017.  Therapy discontinued in August 2018 due to progression of disease.  Taxotere chemotherapy at 75 mg/m given every 3 weeks started on the first 2019.  He completed 8 cycles of therapy in January 2020.  He is status post epidural compression and decompressive laminectomy completed on March 09, 2019 between T7-T8 and T9 completed by Dr. Trenton Gammon.  He completed additional radiation therapy in the form of 10 fractions for a total of 30 Gy in July 2020.  Xofigo started on December 16, 2018.  He completed 6 months of therapy on July 03, 2019.  Current therapy:   Androgen deprivation therapy every 4 months.  He received Lupron in September and will receive Eligard on October 02, 2019.      Interim History:  Dennis Fischer is here for a follow-up visit.  Since the last visit, he reports no major changes in his health.  He was diagnosed with shingles and was evaluated in the emergency department on September 28, 2019 and was found to have involvement of the left thorax and neck area. He reports his symptoms has improved at this time although he does have some residual neuropathic pain.  He denied any nausea, vomiting or abdominal pain.  He denies any constipation or diarrhea.  He is still requiring a lot of assistance with ambulation using a walker.  He is improving in his strength in his  lower extremities.                Medications: Reviewed without any changes. Current Outpatient Medications  Medication Sig Dispense Refill  . acetaminophen (TYLENOL) 325 MG tablet Take 1-2 tablets (325-650 mg total) by mouth every 4 (four) hours as needed for mild pain.    Marland Kitchen amLODipine (NORVASC) 10 MG tablet TAKE 1 TABLET(10 MG) BY MOUTH DAILY (Patient taking differently: Take 10 mg by mouth daily. ) 90 tablet 3  . CALCIUM 500/D 500-200 MG-UNIT tablet TAKE 1 TABLET BY MOUTH TWICE DAILY (Patient taking differently: Take 1 tablet by mouth daily with breakfast. ) 60 tablet 3  . carvedilol (COREG) 12.5 MG tablet TAKE 1 TABLET BY MOUTH TWICE DAILY WITH A MEAL (Patient taking differently: Take 12.5 mg by mouth 2 (two) times daily with a meal. ) 180 tablet 3  . cyclobenzaprine (FLEXERIL) 10 MG tablet TAKE 1 TABLET BY MOUTH THREE TIMES DAILY AS NEEDED FOR MUSCLE SPASMS (Patient taking differently: Take 10 mg by mouth 3 (three) times daily as needed for muscle spasms. ) 30 tablet 0  . gabapentin (NEURONTIN) 300 MG capsule TAKE 1 CAPSULE(300 MG) BY MOUTH AT BEDTIME 30 capsule 5  . HYDROcodone-acetaminophen (NORCO) 5-325 MG tablet Take 1 tablet by mouth every 4 (four) hours as needed for moderate pain. 20 tablet 0  . Multiple Vitamin (MULTIVITAMIN WITH MINERALS) TABS tablet Take 1 tablet by mouth daily.    . pantoprazole (PROTONIX) 40 MG tablet Take 1 tablet (40 mg total) by mouth daily.    Marland Kitchen  polyethylene glycol (MIRALAX / GLYCOLAX) 17 g packet Take 17 g by mouth daily as needed for mild constipation. 30 packet 3  . potassium chloride SA (KLOR-CON) 20 MEQ tablet Take 1 tablet (20 mEq total) by mouth daily for 3 days. Patient reports has potassium pills at home. 3 tablet 0  . prochlorperazine (COMPAZINE) 10 MG tablet TAKE 1 TABLET BY MOUTH EVERY 6 HOURS AS NEEDED FOR NAUSEA OR VOMITING (Patient taking differently: Take 10 mg by mouth every 6 (six) hours as needed for nausea or vomiting. ) 368  tablet 3  . senna-docusate (SENOKOT-S) 8.6-50 MG tablet Take 2 tablets by mouth 2 (two) times daily. (Patient not taking: Reported on 08/03/2019) 60 tablet 1  . simvastatin (ZOCOR) 20 MG tablet Take 1 tablet (20 mg total) by mouth daily. 90 tablet 3  . valACYclovir (VALTREX) 1000 MG tablet Take 1 tablet (1,000 mg total) by mouth 3 (three) times daily for 7 days. 21 tablet 0   No current facility-administered medications for this visit.     Allergies:  Allergies  Allergen Reactions  . Lisinopril Swelling    Facial swelling  . Cat Hair Extract     eyes swell  . Mangifera Indica     MANGO --  . Mango Flavor     throat swells with mango  . Other   . Peanut-Containing Drug Products   . Pistachio Nut (Diagnostic)     hands and feet burn/itch  . Sunflower Oil     Sunflower seed allergy        Physical Exam:    Blood pressure (!) 147/86, pulse 95, temperature 98.3 F (36.8 C), temperature source Temporal, resp. rate 18, height 6' (1.829 m), weight 157 lb 3.2 oz (71.3 kg), SpO2 99 %.       ECOG: 2   General appearance: Comfortable appearing without any discomfort Head: Normocephalic without any trauma Oropharynx: Mucous membranes are moist and pink without any thrush or ulcers. Eyes: Pupils are equal and round reactive to light. Lymph nodes: No cervical, supraclavicular, inguinal or axillary lymphadenopathy.   Heart:regular rate and rhythm.  S1 and S2 without leg edema. Lung: Clear without any rhonchi or wheezes.  No dullness to percussion. Abdomin: Soft, nontender, nondistended with good bowel sounds.  No hepatosplenomegaly. Musculoskeletal: No joint deformity or effusion.  Full range of motion noted. Neurological: No deficits noted on motor, sensory and deep tendon reflex exam. Skin: Scarred rash noted on his neck and left back.  No pustules noted.               Lab Results: Lab Results  Component Value Date   WBC 4.5 08/11/2019   HGB 11.8 (L)  08/11/2019   HCT 35.7 (L) 08/11/2019   MCV 91 08/11/2019   PLT 301 08/11/2019     Chemistry      Component Value Date/Time   NA 141 08/11/2019 1352   NA 141 08/08/2017 1116   K 4.2 08/11/2019 1352   K 3.6 08/08/2017 1116   CL 102 08/11/2019 1352   CO2 24 08/11/2019 1352   CO2 24 08/08/2017 1116   BUN 11 08/11/2019 1352   BUN 13.2 08/08/2017 1116   CREATININE 0.86 08/11/2019 1352   CREATININE 0.70 07/24/2019 1100   CREATININE 1.1 08/08/2017 1116      Component Value Date/Time   CALCIUM 9.2 08/11/2019 1352   CALCIUM 9.1 08/08/2017 1116   ALKPHOS 47 08/04/2019 0556   ALKPHOS 214 (H) 08/08/2017 1116  AST 13 (L) 08/04/2019 0556   AST 13 (L) 07/24/2019 1100   AST 17 08/08/2017 1116   ALT 11 08/04/2019 0556   ALT 13 07/24/2019 1100   ALT 17 08/08/2017 1116   BILITOT 0.7 08/04/2019 0556   BILITOT 0.3 07/24/2019 1100   BILITOT 0.29 08/08/2017 1116          Results for Dennis Fischer, Dennis Fischer (MRN [id number]) as of 10/02/2019 09:28  Ref. Range 05/22/2019 12:02 07/24/2019 11:00  Prostate Specific Ag, Serum Latest Ref Range: 0.0 - 4.0 ng/mL 232.0 (H) 63.3 (H)         Impression and Plan:  74 year old man with:   1.  Castration-resistant prostate cancer with disease to the bone diagnosed in 2018.    He is currently receiving androgen deprivation therapy alone after completing the above treatment history.  The natural course of this disease was reviewed and different salvage therapy options were discussed.  His last PSA did show reasonable decline but likely will be temporary.  Additional therapy would include Xtandi, Jevtana chemotherapy or potentially a PARP inhibitor if he harbors the appropriate mutation.  We will obtain next generation sequencing on existing sample from June 2020.  He is agreeable with this plan.  2. Androgen depravation: He is a due to receive Eligard today and repeated in 4 months.  Long-term complications including weight gain, hot flashes and  osteoporosis were reiterated.   3.  Anemia: Related to malignancy as well as previous treatment.  Hemoglobin is adequate and does not require any growth factor support.  4.  Goals of care: His disease is incurable although aggressive measures are warranted given his reasonable performance status.  5.  Bone directed therapy: I recommended calcium and vitamin D supplements.  Delton See will be considered after dental clearance.  6.  IV access: Port-A-Cath currently in place and will be flushed periodically.  7.  Shingles: Appears to be resolving at this time with very little residual pain.  8. Follow-up: He will return in 2 months for a repeat follow-up.  30  minutes was dedicated to this encounter.  The time was spent on reviewing laboratory data, disease status update, discussing treatment options as well as future plan of care.   Zola Button, MD 1/22/20219:56 AM

## 2019-10-02 NOTE — Patient Instructions (Signed)

## 2019-10-03 LAB — PROSTATE-SPECIFIC AG, SERUM (LABCORP): Prostate Specific Ag, Serum: 154 ng/mL — ABNORMAL HIGH (ref 0.0–4.0)

## 2019-10-05 ENCOUNTER — Ambulatory Visit: Payer: Medicare Other | Admitting: Physical Therapy

## 2019-10-05 ENCOUNTER — Encounter: Payer: Self-pay | Admitting: Physical Therapy

## 2019-10-05 ENCOUNTER — Telehealth: Payer: Self-pay | Admitting: Oncology

## 2019-10-05 NOTE — Telephone Encounter (Signed)
Scheduled appt per 1/22 los.  Sent a message to HIM pool to get a calendar mailed out. 

## 2019-10-05 NOTE — Therapy (Signed)
[facility] [address] [place], [place], [address] Phone: [phone]   Fax:  [phone]  Patient Details  Name: Liangelo Muckleroy MRN: [id number] Date of Birth: 1946/02/25 Referring Provider:  No ref. provider found  Encounter Date: 10/05/2019   Pt called to cancel appointments for this week due to having shingles. Therapist called pt and spoke with him on the phone and he will call back when he is ready to resume PT.    Arliss Journey, PT, DPT  10/05/2019, 3:53 PM  [facility] [address] [place], [place], [address] Phone: [phone]   Fax:  [phone]

## 2019-10-07 ENCOUNTER — Ambulatory Visit: Payer: Medicare Other | Admitting: Physical Therapy

## 2019-10-19 LAB — GUARDANT 360

## 2019-10-20 ENCOUNTER — Ambulatory Visit (INDEPENDENT_AMBULATORY_CARE_PROVIDER_SITE_OTHER): Payer: Medicare Other | Admitting: Family Medicine

## 2019-10-20 ENCOUNTER — Other Ambulatory Visit: Payer: Self-pay

## 2019-10-20 ENCOUNTER — Encounter: Payer: Self-pay | Admitting: Family Medicine

## 2019-10-20 NOTE — Progress Notes (Signed)
Patient scheduled visit today in hopes for Covid vaccine.  Unfortunately, our clinic does not have this vaccine.  Patient does not have any other complaints for today thus, appointment was marked is erroneous and no charge was filed.

## 2019-10-20 NOTE — Progress Notes (Deleted)
  Established Patient - Annual Preventative Physical Subjective  Patient ID: MRN [id number]  Date of birth: 09/03/1946   PCP: [name], MD  CC: Annual Preventative Physical + No chief complaint on file.   [id number] Dennis Fischer is a 74 y.o. male who presents today with the following problems:  *** ***  HISTORY Medications, allergies, medical history, family history and social history were reviewed and edited as necessary. Pertinent findings included in HPI. Pertinent Changes in History:  Medical: *** Surgical: ***  Family: ***  Social: ***  Dennis Fischer reports that he quit smoking about 5 years ago. His smoking use included cigarettes. He has a 12.50 pack-year smoking history. He has never used smokeless tobacco. He reports current alcohol use. He reports current drug use. Drug: Marijuana. ROS: See HPI  Objective  Physical Exam:  There were no vitals taken for this visit. Gen: NAD, alert, non-toxic, well-nourished, well-appearing, pleasant HEENT: Normocephaic, atraumatic. PERRLA, clear conjuctiva, no scleral icterus and injection. Normal EOM.  Hearing intact. TM pearly grey bilaterally with no fluid.  Neck supple with no LAD, nodules, or gross abnormality.  Nares patent with no discharge.  Maxillary and frontal sinuses nontender to palpation.  Oropharynx without erythema and lesions.  Tonsils nonswollen and without exudate.   CV: Regular rate and rhythm.  Normal S1-S2.  No murmur, gallops, S3, S4 appreciated.  Normal capillary refill bilaterally.  Radial pulses 2+ bilaterally. No bilateral lower extremity edema. Resp: Clear to auscultation bilaterally.  No wheezing, rales, rhonchi, or other abnormal lung sounds.  No increased work of breathing appreciated. Abd: Nontender and nondistended on palpation to all 4 quadrants.  Positive bowel sounds. Skin: No obvious rashes, lesions, or trauma.  Normal turgor.  MSK: Normal ROM. Normal strength and tone.  Neuro: Cranial nerves II through VI  grossly intact. Gait normal.  Alert and oriented x4.  No obvious abnormal movements. Psych: Cooperative with exam.  Normal speech. Pleasant. Makes good eye contact. Genitourinary: deferred.   Pertinent Labs & Imaging:  ***  Assessment & Plan  No problem-specific Assessment & Plan notes found for this encounter.   There are no preventive care reminders to display for this patient. Health Maintenance discussed with patient and patient agrees to address when able.   Follow-up:  Future Appointments  Date Time Provider [place]  10/20/2019  1:50 PM [name], MD [facility]  12/01/2019  8:45 AM [facility] LAB 2 [facility] None  12/01/2019  9:00 AM [facility] FLUSH [facility] None  12/01/2019  9:30 AM Wyatt Portela, MD [facility] None    [name], M.D.  PGY-2  Family Medicine  [phone] 10/20/2019 6:45 AM

## 2019-10-26 ENCOUNTER — Ambulatory Visit: Payer: Medicare Other | Attending: Internal Medicine

## 2019-10-26 DIAGNOSIS — Z23 Encounter for immunization: Secondary | ICD-10-CM | POA: Insufficient documentation

## 2019-10-26 NOTE — Progress Notes (Signed)
   Covid-19 Vaccination Clinic  Name:  Timon Foglia    MRN: [id number] DOB: 08/22/1946  10/26/2019  Mr. Walworth was observed post Covid-19 immunization for 15 minutes without incidence. He was provided with Vaccine Information Sheet and instruction to access the V-Safe system.   Mr. Olley was instructed to call 911 with any severe reactions post vaccine: Marland Kitchen Difficulty breathing  . Swelling of your face and throat  . A fast heartbeat  . A bad rash all over your body  . Dizziness and weakness    Immunizations Administered    Name Date Dose VIS Date Route   Pfizer COVID-19 Vaccine 10/26/2019  4:30 PM 0.3 mL 08/21/2019 Intramuscular   Manufacturer: [place]   Lot: [id number]   [place]: [id number]

## 2019-10-28 ENCOUNTER — Encounter (HOSPITAL_COMMUNITY): Payer: Self-pay

## 2019-11-17 ENCOUNTER — Ambulatory Visit: Payer: Medicare Other | Attending: Internal Medicine

## 2019-11-17 DIAGNOSIS — Z23 Encounter for immunization: Secondary | ICD-10-CM | POA: Insufficient documentation

## 2019-11-17 NOTE — Progress Notes (Signed)
   Covid-19 Vaccination Clinic  Name:  Abdulmohsen Siefken    MRN: [id number] DOB: November 18, 1945  11/17/2019  Mr. Angotti was observed post Covid-19 immunization for 15 minutes without incident. He was provided with Vaccine Information Sheet and instruction to access the V-Safe system.   Mr. Cramer was instructed to call 911 with any severe reactions post vaccine: Marland Kitchen Difficulty breathing  . Swelling of face and throat  . A fast heartbeat  . A bad rash all over body  . Dizziness and weakness   Immunizations Administered    Name Date Dose VIS Date Route   Pfizer COVID-19 Vaccine 11/17/2019 11:09 AM 0.3 mL 08/21/2019 Intramuscular   Manufacturer: [place]   Lot: [id number]   [place]: [id number]

## 2019-11-18 ENCOUNTER — Ambulatory Visit: Payer: Medicare Other

## 2019-12-01 ENCOUNTER — Other Ambulatory Visit: Payer: Self-pay

## 2019-12-01 ENCOUNTER — Inpatient Hospital Stay: Payer: Medicare Other

## 2019-12-01 ENCOUNTER — Telehealth: Payer: Self-pay

## 2019-12-01 ENCOUNTER — Telehealth: Payer: Self-pay | Admitting: Pharmacist

## 2019-12-01 ENCOUNTER — Inpatient Hospital Stay: Payer: Medicare Other | Attending: Oncology | Admitting: Oncology

## 2019-12-01 VITALS — BP 125/82 | HR 80 | Temp 99.1°F | Resp 18 | Ht 72.0 in | Wt 160.1 lb

## 2019-12-01 DIAGNOSIS — C7951 Secondary malignant neoplasm of bone: Secondary | ICD-10-CM | POA: Diagnosis not present

## 2019-12-01 DIAGNOSIS — Z95828 Presence of other vascular implants and grafts: Secondary | ICD-10-CM

## 2019-12-01 DIAGNOSIS — Z9221 Personal history of antineoplastic chemotherapy: Secondary | ICD-10-CM | POA: Diagnosis not present

## 2019-12-01 DIAGNOSIS — C61 Malignant neoplasm of prostate: Secondary | ICD-10-CM

## 2019-12-01 DIAGNOSIS — Z923 Personal history of irradiation: Secondary | ICD-10-CM | POA: Diagnosis not present

## 2019-12-01 LAB — CBC WITH DIFFERENTIAL (CANCER CENTER ONLY)
Abs Immature Granulocytes: 0.01 10*3/uL (ref 0.00–0.07)
Basophils Absolute: 0 10*3/uL (ref 0.0–0.1)
Basophils Relative: 0 %
Eosinophils Absolute: 0.1 10*3/uL (ref 0.0–0.5)
Eosinophils Relative: 4 %
HCT: 29 % — ABNORMAL LOW (ref 39.0–52.0)
Hemoglobin: 9.6 g/dL — ABNORMAL LOW (ref 13.0–17.0)
Immature Granulocytes: 0 %
Lymphocytes Relative: 36 %
Lymphs Abs: 1.2 10*3/uL (ref 0.7–4.0)
MCH: 31.5 pg (ref 26.0–34.0)
MCHC: 33.1 g/dL (ref 30.0–36.0)
MCV: 95.1 fL (ref 80.0–100.0)
Monocytes Absolute: 0.2 10*3/uL (ref 0.1–1.0)
Monocytes Relative: 7 %
Neutro Abs: 1.7 10*3/uL (ref 1.7–7.7)
Neutrophils Relative %: 53 %
Platelet Count: 133 10*3/uL — ABNORMAL LOW (ref 150–400)
RBC: 3.05 MIL/uL — ABNORMAL LOW (ref 4.22–5.81)
RDW: 15.9 % — ABNORMAL HIGH (ref 11.5–15.5)
WBC Count: 3.3 10*3/uL — ABNORMAL LOW (ref 4.0–10.5)
nRBC: 0 % (ref 0.0–0.2)

## 2019-12-01 LAB — CMP (CANCER CENTER ONLY)
ALT: 12 U/L (ref 0–44)
AST: 16 U/L (ref 15–41)
Albumin: 3.5 g/dL (ref 3.5–5.0)
Alkaline Phosphatase: 70 U/L (ref 38–126)
Anion gap: 8 (ref 5–15)
BUN: 14 mg/dL (ref 8–23)
CO2: 26 mmol/L (ref 22–32)
Calcium: 9.2 mg/dL (ref 8.9–10.3)
Chloride: 105 mmol/L (ref 98–111)
Creatinine: 0.88 mg/dL (ref 0.61–1.24)
GFR, Est AFR Am: >60 mL/min (ref >60)
GFR, Estimated: >60 mL/min (ref >60)
Glucose, Bld: 99 mg/dL (ref 70–99)
Potassium: 4 mmol/L (ref 3.5–5.1)
Sodium: 139 mmol/L (ref 135–145)
Total Bilirubin: 0.4 mg/dL (ref 0.3–1.2)
Total Protein: 6.5 g/dL (ref 6.5–8.1)

## 2019-12-01 MED ORDER — HEPARIN SOD (PORK) LOCK FLUSH 100 UNIT/ML IV SOLN
500.0000 [IU] | Freq: Once | INTRAVENOUS | Status: AC
  Administered 2019-12-01: 500 [IU] via INTRAVENOUS
  Filled 2019-12-01: qty 5

## 2019-12-01 MED ORDER — OLAPARIB 150 MG PO TABS: 300.0000 mg | ORAL_TABLET | Freq: Two times a day (BID) | ORAL | 0 refills | Status: DC

## 2019-12-01 MED ORDER — SODIUM CHLORIDE 0.9% FLUSH
10.0000 mL | Freq: Once | INTRAVENOUS | Status: AC
  Administered 2019-12-01: 10 mL via INTRAVENOUS
  Filled 2019-12-01: qty 10

## 2019-12-01 NOTE — Telephone Encounter (Signed)
Oral Oncology Patient Advocate Encounter  After completing a benefits investigation, prior authorization for Dennis Fischer is not required at this time through [place].  Patient's copay is [id number].  [name] Patient [place] Phone [phone] Fax [phone] 12/01/2019 11:38 AM

## 2019-12-01 NOTE — Progress Notes (Signed)
Hematology and Oncology Follow Up Visit  Cortlan Dolin [id number] [birth year] 74 y.o. 12/01/2019 9:40 AM [name], MDKim, [name], MD   Principle Diagnosis: 74 year old man with castration-resistant prostate cancer with disease to the bone diagnosed in 2018.    Prior Therapy: He received definitive therapy using radiation therapy and androgen deprivation in 2016.  He developed advanced disease with pelvic adenopathy and was treated with androgen deprivation while was living in [place].  He subsequently developed castration resistant disease.  Zytiga 1000 mg daily restarted on 03/14/2017.  Therapy discontinued in August 2018 due to progression of disease.  Taxotere chemotherapy at 75 mg/m given every 3 weeks started on the first 2019.  He completed 8 cycles of therapy in January 2020.  He is status post epidural compression and decompressive laminectomy completed on March 09, 2019 between T7-T8 and T9 completed by Dr. Trenton Gammon.  He completed additional radiation therapy in the form of 10 fractions for a total of 30 Gy in July 2020.  Xofigo started on December 16, 2018.  He completed 6 months of therapy on July 03, 2019.  Current therapy:   Androgen deprivation therapy every 4 months.  He received Lupron in September and will receive Eligard on October 02, 2019.  Under evaluation to consider different salvage therapy.    Interim History:  Mr. Rosencrans is here for a follow-up.  Since the last visit, he reports no major changes in his health.  He continues to be limited in his mobility because of a previous epidural compression of his tumor.  He is reporting reasonable quality of life at this time without any increased back pain or weight loss.  His appetite is reasonable.  He denies any urinary symptoms at this time.  He denies any excessive fatigue or tiredness.                Medications: Unchanged on review. Current Outpatient Medications  Medication Sig Dispense Refill   . acetaminophen (TYLENOL) 325 MG tablet Take 1-2 tablets (325-650 mg total) by mouth every 4 (four) hours as needed for mild pain.    Marland Kitchen amLODipine (NORVASC) 10 MG tablet TAKE 1 TABLET(10 MG) BY MOUTH DAILY (Patient taking differently: Take 10 mg by mouth daily. ) 90 tablet 3  . CALCIUM 500/D 500-200 MG-UNIT tablet TAKE 1 TABLET BY MOUTH TWICE DAILY (Patient taking differently: Take 1 tablet by mouth daily with breakfast. ) 60 tablet 3  . carvedilol (COREG) 12.5 MG tablet TAKE 1 TABLET BY MOUTH TWICE DAILY WITH A MEAL (Patient taking differently: Take 12.5 mg by mouth 2 (two) times daily with a meal. ) 180 tablet 3  . cyclobenzaprine (FLEXERIL) 10 MG tablet TAKE 1 TABLET BY MOUTH THREE TIMES DAILY AS NEEDED FOR MUSCLE SPASMS (Patient taking differently: Take 10 mg by mouth 3 (three) times daily as needed for muscle spasms. ) 30 tablet 0  . gabapentin (NEURONTIN) 300 MG capsule TAKE 1 CAPSULE(300 MG) BY MOUTH AT BEDTIME 30 capsule 5  . HYDROcodone-acetaminophen (NORCO) 5-325 MG tablet Take 1 tablet by mouth every 4 (four) hours as needed for moderate pain. 20 tablet 0  . Multiple Vitamin (MULTIVITAMIN WITH MINERALS) TABS tablet Take 1 tablet by mouth daily.    . pantoprazole (PROTONIX) 40 MG tablet Take 1 tablet (40 mg total) by mouth daily.    . polyethylene glycol (MIRALAX / GLYCOLAX) 17 g packet Take 17 g by mouth daily as needed for mild constipation. 30 packet 3  . potassium chloride  SA (KLOR-CON) 20 MEQ tablet Take 1 tablet (20 mEq total) by mouth daily for 3 days. Patient reports has potassium pills at home. 3 tablet 0  . prochlorperazine (COMPAZINE) 10 MG tablet TAKE 1 TABLET BY MOUTH EVERY 6 HOURS AS NEEDED FOR NAUSEA OR VOMITING (Patient taking differently: Take 10 mg by mouth every 6 (six) hours as needed for nausea or vomiting. ) 368 tablet 3  . senna-docusate (SENOKOT-S) 8.6-50 MG tablet Take 2 tablets by mouth 2 (two) times daily. (Patient not taking: Reported on 08/03/2019) 60 tablet 1  .  simvastatin (ZOCOR) 20 MG tablet Take 1 tablet (20 mg total) by mouth daily. 90 tablet 3   No current facility-administered medications for this visit.     Allergies:  Allergies  Allergen Reactions  . Lisinopril Swelling    Facial swelling  . Cat Hair Extract     eyes swell  . Mangifera Indica     MANGO --  . Mango Flavor     throat swells with mango  . Other   . Peanut-Containing Drug Products   . Pistachio Nut (Diagnostic)     hands and feet burn/itch  . Sunflower Oil     Sunflower seed allergy        Physical Exam:      Blood pressure 125/82, pulse 80, temperature 99.1 F (37.3 C), temperature source Temporal, resp. rate 18, height 6' (1.829 m), weight 160 lb 1.6 oz (72.6 kg), SpO2 95 %.      ECOG: 2    General appearance: Alert, awake without any distress. Head: Atraumatic without abnormalities Oropharynx: Without any thrush or ulcers. Eyes: No scleral icterus. Lymph nodes: No lymphadenopathy noted in the cervical, supraclavicular, or axillary nodes Heart:regular rate and rhythm, without any murmurs or gallops.   Lung: Clear to auscultation without any rhonchi, wheezes or dullness to percussion. Abdomin: Soft, nontender without any shifting dullness or ascites. Musculoskeletal: No clubbing or cyanosis. Neurological: No motor or sensory deficits. Skin: No rashes or lesions.                Lab Results: Lab Results  Component Value Date   WBC 4.8 10/02/2019   HGB 9.7 (L) 10/02/2019   HCT 29.3 (L) 10/02/2019   MCV 90.7 10/02/2019   PLT 143 (L) 10/02/2019     Chemistry      Component Value Date/Time   NA 138 10/02/2019 0950   NA 141 08/11/2019 1352   NA 141 08/08/2017 1116   K 3.6 10/02/2019 0950   K 3.6 08/08/2017 1116   CL 105 10/02/2019 0950   CO2 25 10/02/2019 0950   CO2 24 08/08/2017 1116   BUN 11 10/02/2019 0950   BUN 11 08/11/2019 1352   BUN 13.2 08/08/2017 1116   CREATININE 0.81 10/02/2019 0950   CREATININE 1.1  08/08/2017 1116      Component Value Date/Time   CALCIUM 8.2 (L) 10/02/2019 0950   CALCIUM 9.1 08/08/2017 1116   ALKPHOS 73 10/02/2019 0950   ALKPHOS 214 (H) 08/08/2017 1116   AST 15 10/02/2019 0950   AST 17 08/08/2017 1116   ALT 15 10/02/2019 0950   ALT 17 08/08/2017 1116   BILITOT 0.3 10/02/2019 0950   BILITOT 0.29 08/08/2017 1116          Results for TIMOTY, BOURKE (MRN [id number]) as of 12/01/2019 09:13  Ref. Range 05/22/2019 12:02 07/24/2019 11:00 10/02/2019 09:50  Prostate Specific Ag, Serum Latest Ref Range: 0.0 - 4.0 ng/mL 232.0 (H)  63.3 (H) 154.0 (H)          Impression and Plan:  74 year old man with:   1.  Advanced prostate cancer with disease to the bone that is currently castration-resistant  diagnosed in 2018.    He is status post therapy outlined above and has developed progression of disease.  PSA in January 2021 has increased after slight decline in November 2020.  Treatment options were reiterated at this time including systemic chemotherapy with Jevtana versus Lynparza.  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  Complication associated with this medication was reviewed today.  These include nausea, fatigue, potential myelosuppression other GI complications.  After discussion today he is agreeable to proceed.    2. Androgen depravation: Next Eligard will be given in May 2021.  Long-term complications including weight gain, hot flashes among others were reviewed.  He is agreeable to continue.   3.  Anemia: Hemoglobin is currently pending we will continue to monitor closely.  His anemia is related to previous malignancy and treatment.  4.  Goals of care: Therapy remains palliative although aggressive measures are warranted given his reasonable performance status.  5.  Bone directed therapy: He is currently on calcium and vitamin D supplements.  Delton See has been deferred as of dental  issues.  6.  IV access: Port-A-Cath remains in place and will be flushed periodically.  7.  Pain: None reported at this time.  His bone pain is improved and no postherpetic neuralgia.  8. Follow-up: In 2 months for a follow-up visit and Eligard injection.  30  minutes were spent on this visit.  Time was dedicated to reviewing his laboratory data, pathology and molecular testing, complex lytic therapy and future plan of care.   Zola Button, MD 3/23/20219:40 AM

## 2019-12-01 NOTE — Telephone Encounter (Signed)
Oral Oncology Pharmacist Encounter  Received new prescription for Lynparza (olaparib) 300 mg twice daily for the treatment of metastatic castration-resistant prostate cancer in conjunction with Eligard every 4 months, planned duration until disease progression or unacceptable toxicity. Patient has ATM mutation which confers improved susceptibility to PARP inhibition.  WBC 3.3, Hg 9.6, PLT 133, ANC 1.7, AST and ALT wnl,  from 12/01/2019 assessed. Hg will be followed closely and appropriate for initiation at this time. Prescription dose and frequency assessed.   Current medication list in Epic reviewed, no DDIs with [facility] identified upon review.   Prescription has been e-scribed to the [facility] for benefits analysis and approval.  Oral Oncology Clinic will continue to follow for insurance authorization, copayment issues, initial counseling and start date.  Onnie Boer; PharmD Candidate [facility]/HP/AP [facility] [phone]  12/01/2019 1:33 PM

## 2019-12-02 ENCOUNTER — Telehealth: Payer: Self-pay | Admitting: Oncology

## 2019-12-02 LAB — PROSTATE-SPECIFIC AG, SERUM (LABCORP): Prostate Specific Ag, Serum: 361 ng/mL — ABNORMAL HIGH (ref 0.0–4.0)

## 2019-12-02 NOTE — Telephone Encounter (Signed)
Scheduled appt per 3/23 los.  Sent a message to HIM pool to get a calendar mailed out.

## 2019-12-03 ENCOUNTER — Telehealth: Payer: Self-pay

## 2019-12-03 MED ORDER — ONDANSETRON HCL 8 MG PO TABS: 8.0000 mg | ORAL_TABLET | Freq: Three times a day (TID) | ORAL | 1 refills | Status: AC | PRN

## 2019-12-03 NOTE — Telephone Encounter (Signed)
Oral Oncology Patient Advocate Encounter  Met patient in [place] to complete [place] for [place] in an effort to reduce the patient's out of pocket expense for Lynparza to $0.    Application completed and faxed to [phone].   [place] patient assistance phone number for follow up is [phone].   Also gave patient a bottle of samples.  This encounter will be updated until final determination.  [place] [facility] [place] Phone [phone] Fax [phone] 12/03/2019 3:27 PM

## 2019-12-09 NOTE — Telephone Encounter (Signed)
Patient is approved for Lynparza at $0 from [place] 12/09/19-09/09/20  [place] will contact patient to set up first delivery.  [employer] phone [phone]  [place] [facility] [place] Phone [phone] Fax [phone] 12/09/2019 10:05 AM

## 2020-01-29 ENCOUNTER — Inpatient Hospital Stay: Payer: Medicare Other

## 2020-01-29 ENCOUNTER — Other Ambulatory Visit: Payer: Self-pay

## 2020-01-29 ENCOUNTER — Inpatient Hospital Stay (HOSPITAL_BASED_OUTPATIENT_CLINIC_OR_DEPARTMENT_OTHER): Payer: Medicare Other | Admitting: Oncology

## 2020-01-29 ENCOUNTER — Inpatient Hospital Stay: Payer: Medicare Other | Attending: Oncology

## 2020-01-29 VITALS — BP 94/65 | HR 84 | Temp 98.6°F | Resp 18 | Ht 72.0 in | Wt 158.9 lb

## 2020-01-29 DIAGNOSIS — D649 Anemia, unspecified: Secondary | ICD-10-CM | POA: Insufficient documentation

## 2020-01-29 DIAGNOSIS — Z9221 Personal history of antineoplastic chemotherapy: Secondary | ICD-10-CM | POA: Insufficient documentation

## 2020-01-29 DIAGNOSIS — C7951 Secondary malignant neoplasm of bone: Secondary | ICD-10-CM | POA: Diagnosis not present

## 2020-01-29 DIAGNOSIS — Z79899 Other long term (current) drug therapy: Secondary | ICD-10-CM | POA: Diagnosis not present

## 2020-01-29 DIAGNOSIS — Z923 Personal history of irradiation: Secondary | ICD-10-CM | POA: Diagnosis not present

## 2020-01-29 DIAGNOSIS — Z95828 Presence of other vascular implants and grafts: Secondary | ICD-10-CM

## 2020-01-29 DIAGNOSIS — C61 Malignant neoplasm of prostate: Secondary | ICD-10-CM

## 2020-01-29 DIAGNOSIS — G629 Polyneuropathy, unspecified: Secondary | ICD-10-CM | POA: Insufficient documentation

## 2020-01-29 LAB — CBC WITH DIFFERENTIAL (CANCER CENTER ONLY)
Abs Immature Granulocytes: 0.01 10*3/uL (ref 0.00–0.07)
Basophils Absolute: 0 10*3/uL (ref 0.0–0.1)
Basophils Relative: 0 %
Eosinophils Absolute: 0.2 10*3/uL (ref 0.0–0.5)
Eosinophils Relative: 5 %
HCT: 25.4 % — ABNORMAL LOW (ref 39.0–52.0)
Hemoglobin: 8.6 g/dL — ABNORMAL LOW (ref 13.0–17.0)
Immature Granulocytes: 0 %
Lymphocytes Relative: 31 %
Lymphs Abs: 1.1 10*3/uL (ref 0.7–4.0)
MCH: 33.3 pg (ref 26.0–34.0)
MCHC: 33.9 g/dL (ref 30.0–36.0)
MCV: 98.4 fL (ref 80.0–100.0)
Monocytes Absolute: 0.3 10*3/uL (ref 0.1–1.0)
Monocytes Relative: 9 %
Neutro Abs: 1.9 10*3/uL (ref 1.7–7.7)
Neutrophils Relative %: 55 %
Platelet Count: 155 10*3/uL (ref 150–400)
RBC: 2.58 MIL/uL — ABNORMAL LOW (ref 4.22–5.81)
RDW: 17.9 % — ABNORMAL HIGH (ref 11.5–15.5)
WBC Count: 3.5 10*3/uL — ABNORMAL LOW (ref 4.0–10.5)
nRBC: 0.6 % — ABNORMAL HIGH (ref 0.0–0.2)

## 2020-01-29 LAB — CMP (CANCER CENTER ONLY)
ALT: 15 U/L (ref 0–44)
AST: 19 U/L (ref 15–41)
Albumin: 3.5 g/dL (ref 3.5–5.0)
Alkaline Phosphatase: 75 U/L (ref 38–126)
Anion gap: 9 (ref 5–15)
BUN: 13 mg/dL (ref 8–23)
CO2: 25 mmol/L (ref 22–32)
Calcium: 9.1 mg/dL (ref 8.9–10.3)
Chloride: 106 mmol/L (ref 98–111)
Creatinine: 1.02 mg/dL (ref 0.61–1.24)
GFR, Est AFR Am: >60 mL/min (ref >60)
GFR, Estimated: >60 mL/min (ref >60)
Glucose, Bld: 107 mg/dL — ABNORMAL HIGH (ref 70–99)
Potassium: 4.3 mmol/L (ref 3.5–5.1)
Sodium: 140 mmol/L (ref 135–145)
Total Bilirubin: 0.4 mg/dL (ref 0.3–1.2)
Total Protein: 6.7 g/dL (ref 6.5–8.1)

## 2020-01-29 MED ORDER — LEUPROLIDE ACETATE (4 MONTH) 30 MG ~~LOC~~ KIT
PACK | SUBCUTANEOUS | Status: AC
  Filled 2020-01-29: qty 30

## 2020-01-29 MED ORDER — LEUPROLIDE ACETATE (4 MONTH) 30 MG ~~LOC~~ KIT
30.0000 mg | PACK | Freq: Once | SUBCUTANEOUS | Status: AC
  Administered 2020-01-29: 30 mg via SUBCUTANEOUS

## 2020-01-29 MED ORDER — SODIUM CHLORIDE 0.9% FLUSH
10.0000 mL | INTRAVENOUS | Status: DC | PRN
  Administered 2020-01-29: 10 mL via INTRAVENOUS
  Filled 2020-01-29: qty 10

## 2020-01-29 NOTE — Patient Instructions (Signed)

## 2020-01-29 NOTE — Progress Notes (Signed)
Hematology and Oncology Follow Up Visit  Dennis Fischer [id number] 25-Oct-1945 74 y.o. 01/29/2020 9:04 AM Dennis Fischer, MDKim, Dennis Quale, MD   Principle Diagnosis: 74 year old man with castration-resistant prostate cancer with disease to the bone diagnosed in 2018.  He was initially diagnosed with localized disease in 2016.  Prior Therapy: He received definitive therapy using radiation therapy and androgen deprivation in 2016.  He developed advanced disease with pelvic adenopathy and was treated with androgen deprivation while was living in [place].  He subsequently developed castration resistant disease.  Zytiga 1000 mg daily restarted on 03/14/2017.  Therapy discontinued in August 2018 due to progression of disease.  Taxotere chemotherapy at 75 mg/m given every 3 weeks started on the first 2019.  He completed 8 cycles of therapy in January 2020.  He is status post epidural compression and decompressive laminectomy completed on March 09, 2019 between T7-T8 and T9 completed by Dr. Trenton Gammon.  He completed additional radiation therapy in the form of 10 fractions for a total of 30 Gy in July 2020.  Xofigo started on December 16, 2018.  He completed 6 months of therapy on July 03, 2019.  Current therapy:   Androgen deprivation therapy every 4 months.  He is currently on Eligard every 4 months and due next injection on Jan 29, 2020.   Lynparza 300 mg twice a day started March 2021.  This was based on ATM mutation detected on guardant 360 next generation sequencing.   Interim History:  Dennis Fischer returns today for a repeat evaluation.  Since the last visit, he was started on [place] which she has tolerated without any major complications.  He denies any nausea, vomiting or diarrhea.  He denies shortness of breath or difficulty breathing.  His mobility is limited after his epidural involvement of his tumor and currently utilizes a walker at home.  He continues to have appropriate neuropathy in his  lower extremity and currently on gabapentin.  He has been taken during the day after he suffered hallucination taken at nighttime.                Medications: Reviewed without changes. Current Outpatient Medications  Medication Sig Dispense Refill  . acetaminophen (TYLENOL) 325 MG tablet Take 1-2 tablets (325-650 mg total) by mouth every 4 (four) hours as needed for mild pain.    Marland Kitchen amLODipine (NORVASC) 10 MG tablet TAKE 1 TABLET(10 MG) BY MOUTH DAILY (Patient taking differently: Take 10 mg by mouth daily. ) 90 tablet 3  . CALCIUM 500/D 500-200 MG-UNIT tablet TAKE 1 TABLET BY MOUTH TWICE DAILY (Patient taking differently: Take 1 tablet by mouth daily with breakfast. ) 60 tablet 3  . carvedilol (COREG) 12.5 MG tablet TAKE 1 TABLET BY MOUTH TWICE DAILY WITH A MEAL (Patient taking differently: Take 12.5 mg by mouth 2 (two) times daily with a meal. ) 180 tablet 3  . cyclobenzaprine (FLEXERIL) 10 MG tablet TAKE 1 TABLET BY MOUTH THREE TIMES DAILY AS NEEDED FOR MUSCLE SPASMS (Patient taking differently: Take 10 mg by mouth 3 (three) times daily as needed for muscle spasms. ) 30 tablet 0  . gabapentin (NEURONTIN) 300 MG capsule TAKE 1 CAPSULE(300 MG) BY MOUTH AT BEDTIME 30 capsule 5  . HYDROcodone-acetaminophen (NORCO) 5-325 MG tablet Take 1 tablet by mouth every 4 (four) hours as needed for moderate pain. 20 tablet 0  . Multiple Vitamin (MULTIVITAMIN WITH MINERALS) TABS tablet Take 1 tablet by mouth daily.    Marland Kitchen olaparib (LYNPARZA) 150 MG  tablet Take 2 tablets (300 mg total) by mouth 2 (two) times daily. Swallow whole. May take with food to decrease nausea and vomiting. 120 tablet 0  . ondansetron (ZOFRAN) 8 MG tablet Take 1 tablet (8 mg total) by mouth every 8 (eight) hours as needed for nausea or vomiting. 30 tablet 1  . pantoprazole (PROTONIX) 40 MG tablet Take 1 tablet (40 mg total) by mouth daily.    . polyethylene glycol (MIRALAX / GLYCOLAX) 17 g packet Take 17 g by mouth daily as needed  for mild constipation. 30 packet 3  . potassium chloride SA (KLOR-CON) 20 MEQ tablet Take 1 tablet (20 mEq total) by mouth daily for 3 days. Patient reports has potassium pills at home. 3 tablet 0  . prochlorperazine (COMPAZINE) 10 MG tablet TAKE 1 TABLET BY MOUTH EVERY 6 HOURS AS NEEDED FOR NAUSEA OR VOMITING (Patient taking differently: Take 10 mg by mouth every 6 (six) hours as needed for nausea or vomiting. ) 368 tablet 3  . senna-docusate (SENOKOT-S) 8.6-50 MG tablet Take 2 tablets by mouth 2 (two) times daily. (Patient not taking: Reported on 08/03/2019) 60 tablet 1  . simvastatin (ZOCOR) 20 MG tablet Take 1 tablet (20 mg total) by mouth daily. 90 tablet 3   No current facility-administered medications for this visit.     Allergies:  Allergies  Allergen Reactions  . Lisinopril Swelling    Facial swelling  . Cat Hair Extract     eyes swell  . Mangifera Indica     MANGO --  . Mango Flavor     throat swells with mango  . Other   . Peanut-Containing Drug Products   . Pistachio Nut (Diagnostic)     hands and feet burn/itch  . Sunflower Oil     Sunflower seed allergy        Physical Exam:    Pulse 84, temperature 98.6 F (37 C), temperature source Temporal, resp. rate 18, height 6' (1.829 m), weight 158 lb 14.4 oz (72.1 kg), SpO2 100 %.       ECOG: 2     General appearance: Alert, awake without any distress. Head: Atraumatic without abnormalities Oropharynx: Without any thrush or ulcers. Eyes: No scleral icterus. Lymph nodes: No lymphadenopathy noted in the cervical, supraclavicular, or axillary nodes Heart:regular rate and rhythm, without any murmurs or gallops.   Lung: Clear to auscultation without any rhonchi, wheezes or dullness to percussion. Abdomin: Soft, nontender without any shifting dullness or ascites. Musculoskeletal: No clubbing or cyanosis. Neurological: No motor or sensory deficits. Skin: No rashes or lesions.               Lab  Results: Lab Results  Component Value Date   WBC 3.3 (L) 12/01/2019   HGB 9.6 (L) 12/01/2019   HCT 29.0 (L) 12/01/2019   MCV 95.1 12/01/2019   PLT 133 (L) 12/01/2019     Chemistry      Component Value Date/Time   NA 139 12/01/2019 0930   NA 141 08/11/2019 1352   NA 141 08/08/2017 1116   K 4.0 12/01/2019 0930   K 3.6 08/08/2017 1116   CL 105 12/01/2019 0930   CO2 26 12/01/2019 0930   CO2 24 08/08/2017 1116   BUN 14 12/01/2019 0930   BUN 11 08/11/2019 1352   BUN 13.2 08/08/2017 1116   CREATININE 0.88 12/01/2019 0930   CREATININE 1.1 08/08/2017 1116      Component Value Date/Time   CALCIUM 9.2 12/01/2019 0930  CALCIUM 9.1 08/08/2017 1116   ALKPHOS 70 12/01/2019 0930   ALKPHOS 214 (H) 08/08/2017 1116   AST 16 12/01/2019 0930   AST 17 08/08/2017 1116   ALT 12 12/01/2019 0930   ALT 17 08/08/2017 1116   BILITOT 0.4 12/01/2019 0930   BILITOT 0.29 08/08/2017 1116          Results for Dennis Fischer, Dennis Fischer (MRN [id number]) as of 01/29/2020 09:06  Ref. Range 05/22/2019 12:02 07/24/2019 11:00 10/02/2019 09:50 12/01/2019 09:30  Prostate Specific Ag, Serum Latest Ref Range: 0.0 - 4.0 ng/mL 232.0 (H) 63.3 (H) 154.0 (H) 361.0 (H)          Impression and Plan:  74 year old man with:   1.  Advanced prostate cancer with disease to the bone that is currently castration-resistant since 2018.  The natural course of this disease was reviewed at this time.  He is currently on Lynparza without any new complaints.  Risks and benefits of continuing this medication were reviewed.  Alternative option would be salvage chemotherapy utilizing Jevtana.  After discussion today, we will continue with the same dose and schedule normal continue to monitor his PSA.  2. Androgen depravation: He will receive Eligard today and repeated in 4 months.  Complication including weight gain, hot flashes were reiterated.   3.  Anemia: Hemoglobin slightly down and overall stable.  This is related to  malignancy and previous treatment.  4.  Goals of care: Therapy remains palliative focus measures are warranted at this time as he desire continue treatment.  5.  Bone directed therapy: He continues to be on calcium and vitamin D supplements.  Delton See has been deferred because of lack of dental clearance.  6.  IV access: Port-A-Cath remains in place and will be flushed every 2 months.  7.  Neuropathy: He has lower extremity predominantly sensory neuropathy.  He does have some motor aspect of it but overall tolerable.  He is currently on gabapentin which I recommended continuing.  We discussed increasing the dose but he is hesitant given his previous side effects including hallucination.  8. Follow-up: He will return in 8 weeks for follow-up evaluation.  30  minutes were spent on this visit.  The time was dedicated to updating his disease status, reviewing laboratory data, addressing complications related to his cancer and cancer therapy.   Dennis Button, MD 5/21/20219:04 AM

## 2020-01-30 LAB — PROSTATE-SPECIFIC AG, SERUM (LABCORP): Prostate Specific Ag, Serum: 620 ng/mL — ABNORMAL HIGH (ref 0.0–4.0)

## 2020-02-02 ENCOUNTER — Telehealth: Payer: Self-pay | Admitting: Oncology

## 2020-02-02 NOTE — Telephone Encounter (Signed)
Scheduled per 05/21 los, patient has been called and notified. 

## 2020-02-03 ENCOUNTER — Other Ambulatory Visit: Payer: Self-pay | Admitting: Pharmacist

## 2020-02-03 ENCOUNTER — Other Ambulatory Visit: Payer: Self-pay

## 2020-02-03 DIAGNOSIS — C61 Malignant neoplasm of prostate: Secondary | ICD-10-CM

## 2020-02-03 DIAGNOSIS — C7951 Secondary malignant neoplasm of bone: Secondary | ICD-10-CM

## 2020-02-03 MED ORDER — OLAPARIB 150 MG PO TABS: 300.0000 mg | ORAL_TABLET | Freq: Two times a day (BID) | ORAL | 0 refills | Status: DC

## 2020-02-03 NOTE — Progress Notes (Signed)
Oral Chemotherapy Pharmacist Encounter   Refill prescription sent to [facility], patient is enrolled in manufacturer assistance. Prescription redirected to [facility].  Darl Pikes, PharmD, BCPS, BCOP, CPP Hematology/Oncology Clinical Pharmacist [facility] [phone]  02/03/2020 4:25 PM

## 2020-02-19 ENCOUNTER — Other Ambulatory Visit: Payer: Self-pay | Admitting: Student in an Organized Health Care Education/Training Program

## 2020-02-19 DIAGNOSIS — C61 Malignant neoplasm of prostate: Secondary | ICD-10-CM

## 2020-02-19 NOTE — Telephone Encounter (Signed)
Patient needs follow up for blood pressure med refills

## 2020-03-04 NOTE — Telephone Encounter (Signed)
LVM to call office to inform him of below and to assist in getting pt an appointment scheduled if schedule is open.Lucila Klecka Zimmerman Rumple, CMA

## 2020-03-07 NOTE — Telephone Encounter (Signed)
Scheduled

## 2020-03-18 ENCOUNTER — Ambulatory Visit: Payer: PRIVATE HEALTH INSURANCE | Admitting: Family Medicine

## 2020-03-31 ENCOUNTER — Inpatient Hospital Stay: Payer: Medicare Other

## 2020-03-31 ENCOUNTER — Telehealth: Payer: Self-pay | Admitting: Pharmacist

## 2020-03-31 ENCOUNTER — Other Ambulatory Visit: Payer: Self-pay

## 2020-03-31 ENCOUNTER — Encounter: Payer: Self-pay | Admitting: Neurology

## 2020-03-31 ENCOUNTER — Inpatient Hospital Stay: Payer: Medicare Other | Attending: Oncology

## 2020-03-31 ENCOUNTER — Inpatient Hospital Stay (HOSPITAL_BASED_OUTPATIENT_CLINIC_OR_DEPARTMENT_OTHER): Payer: Medicare Other | Admitting: Oncology

## 2020-03-31 VITALS — BP 103/65 | HR 85 | Temp 98.1°F | Resp 18 | Ht 72.0 in | Wt 156.1 lb

## 2020-03-31 DIAGNOSIS — Z95828 Presence of other vascular implants and grafts: Secondary | ICD-10-CM

## 2020-03-31 DIAGNOSIS — Z923 Personal history of irradiation: Secondary | ICD-10-CM | POA: Diagnosis not present

## 2020-03-31 DIAGNOSIS — G629 Polyneuropathy, unspecified: Secondary | ICD-10-CM

## 2020-03-31 DIAGNOSIS — C61 Malignant neoplasm of prostate: Secondary | ICD-10-CM | POA: Insufficient documentation

## 2020-03-31 DIAGNOSIS — Z9221 Personal history of antineoplastic chemotherapy: Secondary | ICD-10-CM | POA: Insufficient documentation

## 2020-03-31 DIAGNOSIS — C7951 Secondary malignant neoplasm of bone: Secondary | ICD-10-CM | POA: Insufficient documentation

## 2020-03-31 DIAGNOSIS — D630 Anemia in neoplastic disease: Secondary | ICD-10-CM | POA: Diagnosis not present

## 2020-03-31 LAB — CMP (CANCER CENTER ONLY)
ALT: 12 U/L (ref 0–44)
AST: 21 U/L (ref 15–41)
Albumin: 3.6 g/dL (ref 3.5–5.0)
Alkaline Phosphatase: 107 U/L (ref 38–126)
Anion gap: 9 (ref 5–15)
BUN: 14 mg/dL (ref 8–23)
CO2: 22 mmol/L (ref 22–32)
Calcium: 9.5 mg/dL (ref 8.9–10.3)
Chloride: 110 mmol/L (ref 98–111)
Creatinine: 1.03 mg/dL (ref 0.61–1.24)
GFR, Est AFR Am: >60 mL/min (ref >60)
GFR, Estimated: >60 mL/min (ref >60)
Glucose, Bld: 110 mg/dL — ABNORMAL HIGH (ref 70–99)
Potassium: 4.1 mmol/L (ref 3.5–5.1)
Sodium: 141 mmol/L (ref 135–145)
Total Bilirubin: 0.4 mg/dL (ref 0.3–1.2)
Total Protein: 6.9 g/dL (ref 6.5–8.1)

## 2020-03-31 LAB — CBC WITH DIFFERENTIAL (CANCER CENTER ONLY)
Abs Immature Granulocytes: 0.01 10*3/uL (ref 0.00–0.07)
Basophils Absolute: 0 10*3/uL (ref 0.0–0.1)
Basophils Relative: 0 %
Eosinophils Absolute: 0.1 10*3/uL (ref 0.0–0.5)
Eosinophils Relative: 3 %
HCT: 26.6 % — ABNORMAL LOW (ref 39.0–52.0)
Hemoglobin: 8.8 g/dL — ABNORMAL LOW (ref 13.0–17.0)
Immature Granulocytes: 0 %
Lymphocytes Relative: 52 %
Lymphs Abs: 1.2 10*3/uL (ref 0.7–4.0)
MCH: 33.5 pg (ref 26.0–34.0)
MCHC: 33.1 g/dL (ref 30.0–36.0)
MCV: 101.1 fL — ABNORMAL HIGH (ref 80.0–100.0)
Monocytes Absolute: 0.1 10*3/uL (ref 0.1–1.0)
Monocytes Relative: 4 %
Neutro Abs: 1 10*3/uL — ABNORMAL LOW (ref 1.7–7.7)
Neutrophils Relative %: 41 %
Platelet Count: 85 10*3/uL — ABNORMAL LOW (ref 150–400)
RBC: 2.63 MIL/uL — ABNORMAL LOW (ref 4.22–5.81)
RDW: 14.6 % (ref 11.5–15.5)
WBC Count: 2.3 10*3/uL — ABNORMAL LOW (ref 4.0–10.5)
nRBC: 0 % (ref 0.0–0.2)

## 2020-03-31 MED ORDER — OLAPARIB 150 MG PO TABS: 300.0000 mg | ORAL_TABLET | Freq: Two times a day (BID) | ORAL | 0 refills | Status: AC

## 2020-03-31 MED ORDER — HEPARIN SOD (PORK) LOCK FLUSH 100 UNIT/ML IV SOLN
500.0000 [IU] | Freq: Once | INTRAVENOUS | Status: AC
  Administered 2020-03-31: 500 [IU] via INTRAVENOUS
  Filled 2020-03-31: qty 5

## 2020-03-31 MED ORDER — SODIUM CHLORIDE 0.9% FLUSH
10.0000 mL | INTRAVENOUS | Status: DC | PRN
  Administered 2020-03-31: 10 mL via INTRAVENOUS
  Filled 2020-03-31: qty 10

## 2020-03-31 MED ORDER — OLAPARIB 150 MG PO TABS: 300.0000 mg | ORAL_TABLET | Freq: Two times a day (BID) | ORAL | 0 refills | Status: DC

## 2020-03-31 NOTE — Telephone Encounter (Signed)
Oral Chemotherapy Pharmacist Encounter   Refill prescription sent to [facility], patient is enrolled in manufacturer assistance. Prescription redirected to [facility].  Darl Pikes, PharmD, BCPS, BCOP, CPP Hematology/Oncology Clinical Pharmacist [facility] [phone]  03/31/2020 10:26 AM

## 2020-03-31 NOTE — Progress Notes (Signed)
Hematology and Oncology Follow Up Visit  Dennis Fischer [id number] 1946-04-30 74 y.o. 03/31/2020 9:06 AM [name], MDKim, [name], MD   Principle Diagnosis: 74 year old man with advanced prostate cancer with disease to the bone diagnosed in 2016.  He has castration-resistant currently. ATM mutation detected on guardant 360 next generation sequencing  Prior Therapy: He received definitive therapy using radiation therapy and androgen deprivation in 2016.  He developed advanced disease with pelvic adenopathy and was treated with androgen deprivation while was living in [place].  He subsequently developed castration resistant disease.  Zytiga 1000 mg daily restarted on 03/14/2017.  Therapy discontinued in August 2018 due to progression of disease.  Taxotere chemotherapy at 75 mg/m given every 3 weeks started on the first 2019.  He completed 8 cycles of therapy in January 2020.  He is status post epidural compression and decompressive laminectomy completed on March 09, 2019 between T7-T8 and T9 completed by Dr. Trenton Gammon.  He completed additional radiation therapy in the form of 10 fractions for a total of 30 Gy in July 2020.  Xofigo started on December 16, 2018.  He completed 6 months of therapy on July 03, 2019.  Current therapy:   Androgen deprivation therapy every 4 months.  He is currently on Eligard every 4 months and due next injection on Jan 29, 2020.   Lynparza 300 mg twice a day started March 2021.     Interim History:  [name] is here for a follow-up visit.  Since the last visit, he reports no major changes in his health.  He reports he has not been taking Lynparza since the last visit as he did not receive a refill.  He denies any increased pain in his back hips.  He denies any increased nausea or vomiting.  His appetite overall reasonable and weight is stable.  He continues to have neuropathy in his lower extremities without any increased  pain.                Medications: Unchanged on review. Current Outpatient Medications  Medication Sig Dispense Refill  . acetaminophen (TYLENOL) 325 MG tablet Take 1-2 tablets (325-650 mg total) by mouth every 4 (four) hours as needed for mild pain.    Marland Kitchen amLODipine (NORVASC) 10 MG tablet Take 1 tablet (10 mg total) by mouth daily. 90 tablet 0  . CALCIUM 500/D 500-200 MG-UNIT tablet TAKE 1 TABLET BY MOUTH TWICE DAILY (Patient taking differently: Take 1 tablet by mouth daily with breakfast. ) 60 tablet 3  . carvedilol (COREG) 12.5 MG tablet TAKE 1 TABLET BY MOUTH TWICE DAILY WITH A MEAL (Patient taking differently: Take 12.5 mg by mouth 2 (two) times daily with a meal. ) 180 tablet 3  . cyclobenzaprine (FLEXERIL) 10 MG tablet TAKE 1 TABLET BY MOUTH THREE TIMES DAILY AS NEEDED FOR MUSCLE SPASMS (Patient taking differently: Take 10 mg by mouth 3 (three) times daily as needed for muscle spasms. ) 30 tablet 0  . gabapentin (NEURONTIN) 300 MG capsule TAKE 1 CAPSULE(300 MG) BY MOUTH AT BEDTIME 30 capsule 5  . HYDROcodone-acetaminophen (NORCO) 5-325 MG tablet Take 1 tablet by mouth every 4 (four) hours as needed for moderate pain. 20 tablet 0  . Multiple Vitamin (MULTIVITAMIN WITH MINERALS) TABS tablet Take 1 tablet by mouth daily.    Marland Kitchen olaparib (LYNPARZA) 150 MG tablet Take 2 tablets (300 mg total) by mouth 2 (two) times daily. Swallow whole. May take with food to decrease nausea and vomiting. 120 tablet  0  . ondansetron (ZOFRAN) 8 MG tablet Take 1 tablet (8 mg total) by mouth every 8 (eight) hours as needed for nausea or vomiting. 30 tablet 1  . pantoprazole (PROTONIX) 40 MG tablet Take 1 tablet (40 mg total) by mouth daily.    . polyethylene glycol (MIRALAX / GLYCOLAX) 17 g packet Take 17 g by mouth daily as needed for mild constipation. 30 packet 3  . potassium chloride SA (KLOR-CON) 20 MEQ tablet Take 1 tablet (20 mEq total) by mouth daily for 3 days. Patient reports has potassium pills at  home. 3 tablet 0  . prochlorperazine (COMPAZINE) 10 MG tablet TAKE 1 TABLET BY MOUTH EVERY 6 HOURS AS NEEDED FOR NAUSEA OR VOMITING (Patient taking differently: Take 10 mg by mouth every 6 (six) hours as needed for nausea or vomiting. ) 368 tablet 3  . senna-docusate (SENOKOT-S) 8.6-50 MG tablet Take 2 tablets by mouth 2 (two) times daily. (Patient not taking: Reported on 08/03/2019) 60 tablet 1  . simvastatin (ZOCOR) 20 MG tablet Take 1 tablet (20 mg total) by mouth daily. 90 tablet 3   No current facility-administered medications for this visit.   Facility-Administered Medications Ordered in Other Visits  Medication Dose Route Frequency Provider Last Rate Last Admin  . sodium chloride flush (NS) 0.9 % injection 10 mL  10 mL Intravenous PRN Wyatt Portela, MD   10 mL at 03/31/20 0901     Allergies:  Allergies  Allergen Reactions  . Lisinopril Swelling    Facial swelling  . Cat Hair Extract     eyes swell  . Mangifera Indica     MANGO --  . Mango Flavor     throat swells with mango  . Other   . Peanut-Containing Drug Products   . Pistachio Nut (Diagnostic)     hands and feet burn/itch  . Sunflower Oil     Sunflower seed allergy        Physical Exam:    Blood pressure 103/65, pulse 85, temperature 98.1 F (36.7 C), temperature source Temporal, resp. rate 18, height 6' (1.829 m), weight 156 lb 1.6 oz (70.8 kg), SpO2 98 %.        ECOG: 2     General appearance: Comfortable appearing without any discomfort Head: Normocephalic without any trauma Oropharynx: Mucous membranes are moist and pink without any thrush or ulcers. Eyes: Pupils are equal and round reactive to light. Lymph nodes: No cervical, supraclavicular, inguinal or axillary lymphadenopathy.   Heart:regular rate and rhythm.  S1 and S2 without leg edema. Lung: Clear without any rhonchi or wheezes.  No dullness to percussion. Abdomin: Soft, nontender, nondistended with good bowel sounds.  No  hepatosplenomegaly. Musculoskeletal: No joint deformity or effusion.  Full range of motion noted. Neurological: No deficits noted on motor, sensory and deep tendon reflex exam. Skin: No petechial rash or dryness.  Appeared moist.                 Lab Results: Lab Results  Component Value Date   WBC 3.5 (L) 01/29/2020   HGB 8.6 (L) 01/29/2020   HCT 25.4 (L) 01/29/2020   MCV 98.4 01/29/2020   PLT 155 01/29/2020     Chemistry      Component Value Date/Time   NA 140 01/29/2020 0845   NA 141 08/11/2019 1352   NA 141 08/08/2017 1116   K 4.3 01/29/2020 0845   K 3.6 08/08/2017 1116   CL 106 01/29/2020 0845  CO2 25 01/29/2020 0845   CO2 24 08/08/2017 1116   BUN 13 01/29/2020 0845   BUN 11 08/11/2019 1352   BUN 13.2 08/08/2017 1116   CREATININE 1.02 01/29/2020 0845   CREATININE 1.1 08/08/2017 1116      Component Value Date/Time   CALCIUM 9.1 01/29/2020 0845   CALCIUM 9.1 08/08/2017 1116   ALKPHOS 75 01/29/2020 0845   ALKPHOS 214 (H) 08/08/2017 1116   AST 19 01/29/2020 0845   AST 17 08/08/2017 1116   ALT 15 01/29/2020 0845   ALT 17 08/08/2017 1116   BILITOT 0.4 01/29/2020 0845   BILITOT 0.29 08/08/2017 1116           Results for Dennis Fischer, Dennis Fischer (MRN [id number]) as of 03/31/2020 09:08  Ref. Range 12/01/2019 09:30 01/29/2020 08:45  Prostate Specific Ag, Serum Latest Ref Range: 0.0 - 4.0 ng/mL 361.0 (H) 620.0 (H)          Impression and Plan:  74 year old man with:   1.  Castration-resistant prostate cancer with disease to the bone diagnosed in 2018.    He was started on Lynparza at this time without any major complications.  He has not been taking it after he ran out and did not request a refill.  PSA has been increasing although currently pending from today.  The natural course of this disease was updated and treatment options were reviewed.  Different salvage therapy options would include systemic chemotherapy which she has tolerated poorly in the  past.  I recommended that continuing [place] for the time being in consideration for different salvage therapy in the future if he develops symptomatic progression.  His performance status is limited which could limit treatment options in the future.  After discussion today, we opted to continue Lynparza at this time will be refilled for him.  Emphasized the importance of continuing taking this medication regularly.  2. Androgen depravation: He received Eligard in May 2021 and will be repeated in September.  Complications including weight gain, hot flashes among others were reviewed.   3.  Anemia: Related to her malignancy as well as chronic disease.  Hemoglobin overall stable.  4.  Goals of care: His disease is incurable although aggressive measures are warranted at this time.  His performance status declines in the future if less aggressive measures are warranted.  5.  Bone directed therapy: I recommended calcium and vitamin D supplements.  Delton See has been deferred at this time.  6.  IV access: Port-A-Cath continues to be flushed periodically.  No issues with access at this time  7.  Neuropathy: Likely related to his metastatic disease.  Is requesting referral to neurology appropriate referral at this time.   8. Follow-up: In 2 months for repeat follow-up  30  minutes were dedicated to this encounter.  The time was spent on updating his disease status, discussing treatment options and outlining future plan of care reviewed.   Zola Button, MD 7/22/20219:06 AM

## 2020-03-31 NOTE — Addendum Note (Signed)
Addended by: Wyatt Portela on: 03/31/2020 09:39 AM   Modules accepted: Orders

## 2020-04-01 ENCOUNTER — Encounter: Payer: Self-pay | Admitting: Neurology

## 2020-04-01 ENCOUNTER — Other Ambulatory Visit: Payer: Self-pay

## 2020-04-01 ENCOUNTER — Encounter: Payer: Self-pay | Admitting: Family Medicine

## 2020-04-01 ENCOUNTER — Ambulatory Visit (INDEPENDENT_AMBULATORY_CARE_PROVIDER_SITE_OTHER): Payer: Medicare Other | Admitting: Family Medicine

## 2020-04-01 DIAGNOSIS — L989 Disorder of the skin and subcutaneous tissue, unspecified: Secondary | ICD-10-CM

## 2020-04-01 LAB — PROSTATE-SPECIFIC AG, SERUM (LABCORP): Prostate Specific Ag, Serum: 696 ng/mL — ABNORMAL HIGH (ref 0.0–4.0)

## 2020-04-01 MED ORDER — SHINGRIX 50 MCG/0.5ML IM SUSR: 0.5000 mL | Freq: Once | INTRAMUSCULAR | 0 refills | Status: AC

## 2020-04-01 NOTE — Progress Notes (Signed)
    SUBJECTIVE:   CHIEF COMPLAINT / HPI:   Sore on right foot  Patient reports that caregiver saw sore on right posterior lateral ankle. Patient reports that he bangs in up against his wheel chair frequently. He denies any foul odor or drainage from the area. Patient reports that his caregiver placed a bandaid over it and it has been improving since.   No fevers, chills, worsening erythema.   PERTINENT  PMH / PSH: metastatic prostate cancer  OBJECTIVE:   BP (!) 102/62   Pulse 85   Ht 6' (1.829 m)   Wt 156 lb 3.2 oz (70.9 kg)   SpO2 98%   BMI 21.18 kg/m   General: well appearing male sitting comfortably in wheel chair.  Skin: 1cm x 1cm circular well circumscribed lesion on posteriolateral ankle. No surrounding erythema. No draining or foul odor. There is a layer of sloughing eipthelium appreciated over entire lesion with hypopigmentation underneath.  2+ Dps bilaterally.   ASSESSMENT/PLAN:   Sore on ankle This does not appear to be a pressure wound. Instead, looks more like a healing blister. Healing well with bandaid. Will continue to monitor, but patient with warm extremities, good pulses, no edema. Continue to keep covered to avoid friction. RTC if worsening or non-healing.    [name], MD Between

## 2020-04-04 ENCOUNTER — Encounter: Payer: Self-pay | Admitting: Family Medicine

## 2020-04-04 DIAGNOSIS — L989 Disorder of the skin and subcutaneous tissue, unspecified: Secondary | ICD-10-CM | POA: Insufficient documentation

## 2020-04-04 NOTE — Assessment & Plan Note (Signed)
This does not appear to be a pressure wound. Instead, looks more like a healing blister. Healing well with bandaid. Will continue to monitor, but patient with warm extremities, good pulses, no edema. Continue to keep covered to avoid friction. RTC if worsening or non-healing.

## 2020-04-05 ENCOUNTER — Telehealth: Payer: Self-pay | Admitting: Oncology

## 2020-04-05 NOTE — Telephone Encounter (Signed)
Scheduled per 07/22 los, patient has been called and notified. 

## 2020-04-11 ENCOUNTER — Ambulatory Visit (INDEPENDENT_AMBULATORY_CARE_PROVIDER_SITE_OTHER): Payer: Medicare Other | Admitting: Neurology

## 2020-04-11 ENCOUNTER — Encounter: Payer: Self-pay | Admitting: Neurology

## 2020-04-11 ENCOUNTER — Other Ambulatory Visit (INDEPENDENT_AMBULATORY_CARE_PROVIDER_SITE_OTHER): Payer: Medicare Other

## 2020-04-11 ENCOUNTER — Other Ambulatory Visit: Payer: Self-pay

## 2020-04-11 VITALS — BP 110/67 | HR 98 | Ht 71.0 in | Wt 155.4 lb

## 2020-04-11 DIAGNOSIS — G620 Drug-induced polyneuropathy: Secondary | ICD-10-CM

## 2020-04-11 DIAGNOSIS — M792 Neuralgia and neuritis, unspecified: Secondary | ICD-10-CM

## 2020-04-11 DIAGNOSIS — R6889 Other general symptoms and signs: Secondary | ICD-10-CM | POA: Diagnosis not present

## 2020-04-11 DIAGNOSIS — T451X5A Adverse effect of antineoplastic and immunosuppressive drugs, initial encounter: Secondary | ICD-10-CM | POA: Diagnosis not present

## 2020-04-11 LAB — TSH: TSH: 1.52 u[IU]/mL (ref 0.35–4.50)

## 2020-04-11 LAB — B12 AND FOLATE PANEL
Folate: >24.8 ng/mL (ref >5.9)
Vitamin B-12: 1028 pg/mL — ABNORMAL HIGH (ref 211–911)

## 2020-04-11 NOTE — Patient Instructions (Signed)
Start lidocaine ointment (Aspercream, Salonpas) and apply to feet 2-3 times daily  Check labs  Check feet daily  Return to clinic as needed

## 2020-04-11 NOTE — Progress Notes (Signed)
[facility] Clinic Note - Initial Visit   Date: 04/11/20  Dennis Fischer MRN: [id number] DOB: Jan 27, 1946   Dear Dr. Alen Blew:  Thank you for your kind referral of Dennis Fischer for consultation of neuropathy. Although his history is well known to you, please allow [place] to reiterate it for the purpose of our medical record. The patient was accompanied to the clinic by significant other who also provides collateral information.     History of Present Illness: Dennis Fischer is a 74 y.o. right-handed male with metastatic prostate Fischer, Dennis Fischer, Dennis Fischer, Dennis hypertension presenting for evaluation of neuropathy.   He was diagnosed with prostate Fischer in 2016 which spread to the bone in 2018.  In June 2020, he underwent T7-T8 Dennis T8-9 decompression for epidural compression.  He developed chemotherapy-induced neuropathy in 2020 Dennis was unable to complete all the cycles.  He reports having tingling Dennis burning in the toes Dennis feet.  Gabapentin 300mg  daily seems to provide relief.  He did not tolerate it at night time due to confusion. He also has generalized weakness Dennis has been using a wheelchair.  Previously he used to drink a couple of shots of liquor nightly for many years.  No history of diabetes.   Out-side paper records, electronic medical record, Dennis images have been reviewed where available Dennis summarized as:  No results found for: HGBA1C Lab Results  Component Value Date   VITAMINB12 414 01/26/2019    Past Medical History:  Diagnosis Date  . Acute blood loss anemia   . Hypertension   . Metastatic Fischer to bone ([place]) dx'd 2018  . Prostate Fischer [facility])     Past Surgical History:  Procedure Laterality Date  . HIP SURGERY Bilateral   . IR IMAGING GUIDED PORT INSERTION  04/22/2018  . LAMINECTOMY N/A 03/09/2019   Procedure: THORACIC SEVEN - THORACIC EIGHT - THORACIC NINE LAMINECTOMY WITH RESECTION OF EPIDURAL TUMOR;  Surgeon: Earnie Larsson, MD;   Location: [place];  Service: Neurosurgery;  Laterality: N/A;     Medications:  Outpatient Encounter Medications as of 04/11/2020  Medication Sig  . acetaminophen (TYLENOL) 325 MG tablet Take 1-2 tablets (325-650 mg total) by mouth every 4 (four) hours as needed for mild pain.  Marland Kitchen amLODipine (NORVASC) 10 MG tablet Take 1 tablet (10 mg total) by mouth daily.  Marland Kitchen CALCIUM 500/D 500-200 MG-UNIT tablet TAKE 1 TABLET BY MOUTH TWICE DAILY (Patient taking differently: Take 1 tablet by mouth daily with breakfast. )  . carvedilol (COREG) 12.5 MG tablet TAKE 1 TABLET BY MOUTH TWICE DAILY WITH A MEAL (Patient taking differently: Take 12.5 mg by mouth 2 (two) times daily with a meal. )  . gabapentin (NEURONTIN) 300 MG capsule TAKE 1 CAPSULE(300 MG) BY MOUTH AT BEDTIME (Patient taking differently: Takes in AM)  . Multiple Vitamin (MULTIVITAMIN WITH MINERALS) TABS tablet Take 1 tablet by mouth daily.  . ondansetron (ZOFRAN) 8 MG tablet Take 1 tablet (8 mg total) by mouth every 8 (eight) hours as needed for nausea or vomiting.  . pantoprazole (PROTONIX) 40 MG tablet Take 1 tablet (40 mg total) by mouth daily.  Marland Kitchen senna-docusate (SENOKOT-S) 8.6-50 MG tablet Take 2 tablets by mouth 2 (two) times daily.  . simvastatin (ZOCOR) 20 MG tablet Take 1 tablet (20 mg total) by mouth daily.  . cyclobenzaprine (FLEXERIL) 10 MG tablet TAKE 1 TABLET BY MOUTH THREE TIMES DAILY AS NEEDED FOR MUSCLE SPASMS (Patient not taking: Reported on 04/11/2020)  . HYDROcodone-acetaminophen (NORCO) 5-325  MG tablet Take 1 tablet by mouth every 4 (four) hours as needed for moderate pain. (Patient not taking: Reported on 04/11/2020)  . olaparib (LYNPARZA) 150 MG tablet Take 2 tablets (300 mg total) by mouth 2 (two) times daily. Swallow whole. May take with food to decrease nausea Dennis vomiting. (Patient not taking: Reported on 04/11/2020)  . polyethylene glycol (MIRALAX / GLYCOLAX) 17 g packet Take 17 g by mouth daily as needed for mild constipation. (Patient  not taking: Reported on 04/11/2020)  . potassium chloride SA (KLOR-CON) 20 MEQ tablet Take 1 tablet (20 mEq total) by mouth daily for 3 days. Patient reports has potassium pills at home.  . prochlorperazine (COMPAZINE) 10 MG tablet TAKE 1 TABLET BY MOUTH EVERY 6 HOURS AS NEEDED FOR NAUSEA OR VOMITING (Patient not taking: Reported on 04/11/2020)   No facility-administered encounter medications on file as of 04/11/2020.    Allergies:  Allergies  Allergen Reactions  . Lisinopril Swelling    Facial swelling  . Cat Hair Extract     eyes swell  . Mangifera Indica     MANGO --  . Mango Flavor     throat swells with mango  . Other   . Peanut-Containing Drug Products   . Pistachio Nut (Diagnostic)     hands Dennis feet burn/itch  . Sunflower Oil     Sunflower seed allergy    Family History: Family History  Problem Relation Age of Onset  . Prostate Fischer Father     Social History: Social History   Tobacco Use  . Smoking status: Former Smoker    Packs/day: 0.25    Years: 50.00    Pack years: 12.50    Types: Cigarettes    Quit date: 09/10/2014    Years since quitting: 5.5  . Smokeless tobacco: Never Used  . Tobacco comment: Reports smoking only when he drank.  Vaping Use  . Vaping Use: Never used  Substance Use Topics  . Alcohol use: Yes    Comment: Once every 2 weeks.   . Drug use: Yes    Types: Marijuana    Comment: Medical    Social History   Social History Narrative  . Not on file    Vital Signs:  BP 110/67   Pulse 98   Ht 5\' 11"  (1.803 m)   Wt 155 lb 6.4 oz (70.5 kg)   SpO2 99%   BMI 21.67 kg/m    Neurological Exam: MENTAL STATUS including orientation to time, place, person, recent Dennis remote memory, attention span Dennis concentration, language, Dennis fund of knowledge is normal.  Speech is not dysarthric.  CRANIAL NERVES: II:  No visual field defects.  .   III-IV-VI: Pupils equal round Dennis reactive to light.  Normal conjugate, extra-ocular eye movements in all  directions of gaze.  No nystagmus.  No ptosis.   V:  Normal facial sensation.    VII:  Normal facial symmetry Dennis movements.   VIII:  Normal hearing Dennis vestibular function.   IX-X:  Normal palatal movement.   XI:  Normal shoulder shrug Dennis head rotation.   XII:  Normal tongue strength Dennis range of motion, no deviation or fasciculation.  MOTOR:  No atrophy, fasciculations or abnormal movements.  No pronator drift.   Upper Extremity:  Right  Left  Deltoid  5/5   5/5   Biceps  5/5   5/5   Triceps  5/5   5/5   Infraspinatus 5/5  5/5  Medial pectoralis  5/5  5/5  Wrist extensors  5/5   5/5   Wrist flexors  5/5   5/5   Finger extensors  5/5   5/5   Finger flexors  5/5   5/5   Dorsal interossei  5/5   5/5   Abductor pollicis  5/5   5/5   Tone (Ashworth scale)  0  0   Lower Extremity:  Right  Left  Hip flexors  5-/5   5-/5   Hip extensors  5-/5   5-/5   Knee flexors  5/5   5/5   Knee extensors  5/5   5/5   Dorsiflexors  5/5   5/5   Plantarflexors  5/5   5/5   Toe extensors  5/5   5/5   Toe flexors  5/5   5/5   Tone (Ashworth scale)  0  0   MSRs:  Right        Left                  brachioradialis 2+  2+  biceps 2+  2+  triceps 2+  2+  patellar 0  0  ankle jerk 0  0  Hoffman no  no  plantar response down  down   SENSORY: Hyperesthesia to pin prick over the dorsum of the feet bilaterally. Vibration absent at the great toe.  COORDINATION/GAIT: Normal finger-to- nose-finger. Gait not tested   IMPRESSION: Chemotherapy-induced neuropathy affecting the feet.  Patient counseled on the pathogenesis, etiology, management, Dennis natural course of neuropathy. Unfortunately, there is no effective treatment Dennis management is symptomatic. I would like to test for other treatable causes of neuropathy including vitamin B1, B12, folate, TSH. For paresthesias, continue gabapentin 300mg  daily.  Titrate as needed Start OTC lidocaine ointment to feet Patient educated on daily foot inspection,  fall prevention, Dennis safety precautions around the home.   Thank you for allowing me to participate in patient's care.  If I can answer any additional questions, I would be pleased to do so.    Sincerely,    Elleah Hemsley K. Posey Pronto, DO

## 2020-04-14 LAB — VITAMIN B1: Vitamin B1 (Thiamine): 32 nmol/L — ABNORMAL HIGH (ref 8–30)

## 2020-05-04 ENCOUNTER — Other Ambulatory Visit: Payer: Self-pay | Admitting: Oncology

## 2020-05-04 DIAGNOSIS — G629 Polyneuropathy, unspecified: Secondary | ICD-10-CM

## 2020-05-09 ENCOUNTER — Telehealth: Payer: Self-pay

## 2020-05-09 NOTE — Telephone Encounter (Signed)
Please call patient back and let them know that he meets requirement for the third Covid vaccine and schedule him in nurse clinic.

## 2020-05-09 NOTE — Telephone Encounter (Signed)
Received phone call from [place], regarding patient and receiving third booster COVID vaccine. Patient is currently receiving chemotherapy for metastatic prostate cancer.   Patient received two dose Phizer vaccine on 2/15 and 11/17/2019. Please advise if patient should be scheduled for third booster.   Talbot Grumbling, RN

## 2020-05-13 NOTE — Telephone Encounter (Signed)
Returned call to patient. No answer, left VM for patient to return call to office to schedule appointment.   Talbot Grumbling, RN

## 2020-05-19 ENCOUNTER — Other Ambulatory Visit: Payer: Self-pay | Admitting: Family Medicine

## 2020-05-19 DIAGNOSIS — C61 Malignant neoplasm of prostate: Secondary | ICD-10-CM

## 2020-05-23 ENCOUNTER — Encounter (HOSPITAL_COMMUNITY): Payer: Self-pay | Admitting: Student

## 2020-05-23 ENCOUNTER — Other Ambulatory Visit: Payer: Self-pay | Admitting: *Deleted

## 2020-05-23 ENCOUNTER — Inpatient Hospital Stay: Payer: Medicare Other

## 2020-05-23 ENCOUNTER — Emergency Department (HOSPITAL_COMMUNITY): Payer: Medicare Other

## 2020-05-23 ENCOUNTER — Telehealth: Payer: Self-pay | Admitting: *Deleted

## 2020-05-23 ENCOUNTER — Inpatient Hospital Stay (HOSPITAL_COMMUNITY)
Admission: EM | Admit: 2020-05-23 | Discharge: 2020-05-24 | DRG: 065 | Disposition: A | Discharge status: Hospice | Payer: Medicare Other | Source: Ambulatory Visit | Attending: Internal Medicine | Admitting: Internal Medicine

## 2020-05-23 ENCOUNTER — Inpatient Hospital Stay: Payer: Medicare Other | Attending: Oncology | Admitting: Medical

## 2020-05-23 ENCOUNTER — Other Ambulatory Visit: Payer: Self-pay

## 2020-05-23 ENCOUNTER — Telehealth: Payer: Self-pay

## 2020-05-23 VITALS — BP 90/53 | HR 83 | Temp 97.8°F | Resp 18 | Ht 71.0 in | Wt 150.5 lb

## 2020-05-23 DIAGNOSIS — C61 Malignant neoplasm of prostate: Secondary | ICD-10-CM | POA: Diagnosis present

## 2020-05-23 DIAGNOSIS — Z91010 Allergy to peanuts: Secondary | ICD-10-CM

## 2020-05-23 DIAGNOSIS — I609 Nontraumatic subarachnoid hemorrhage, unspecified: Secondary | ICD-10-CM

## 2020-05-23 DIAGNOSIS — I959 Hypotension, unspecified: Secondary | ICD-10-CM

## 2020-05-23 DIAGNOSIS — D649 Anemia, unspecified: Secondary | ICD-10-CM | POA: Diagnosis present

## 2020-05-23 DIAGNOSIS — R410 Disorientation, unspecified: Secondary | ICD-10-CM

## 2020-05-23 DIAGNOSIS — C7951 Secondary malignant neoplasm of bone: Secondary | ICD-10-CM | POA: Diagnosis present

## 2020-05-23 DIAGNOSIS — Z888 Allergy status to other drugs, medicaments and biological substances status: Secondary | ICD-10-CM

## 2020-05-23 DIAGNOSIS — Z8042 Family history of malignant neoplasm of prostate: Secondary | ICD-10-CM | POA: Diagnosis not present

## 2020-05-23 DIAGNOSIS — Z515 Encounter for palliative care: Secondary | ICD-10-CM

## 2020-05-23 DIAGNOSIS — D696 Thrombocytopenia, unspecified: Secondary | ICD-10-CM

## 2020-05-23 DIAGNOSIS — K219 Gastro-esophageal reflux disease without esophagitis: Secondary | ICD-10-CM | POA: Diagnosis present

## 2020-05-23 DIAGNOSIS — I9589 Other hypotension: Secondary | ICD-10-CM

## 2020-05-23 DIAGNOSIS — Z91018 Allergy to other foods: Secondary | ICD-10-CM | POA: Diagnosis not present

## 2020-05-23 DIAGNOSIS — Z20822 Contact with and (suspected) exposure to covid-19: Secondary | ICD-10-CM | POA: Diagnosis present

## 2020-05-23 DIAGNOSIS — Z87891 Personal history of nicotine dependence: Secondary | ICD-10-CM | POA: Diagnosis not present

## 2020-05-23 DIAGNOSIS — E7800 Pure hypercholesterolemia, unspecified: Secondary | ICD-10-CM | POA: Diagnosis present

## 2020-05-23 DIAGNOSIS — D72819 Decreased white blood cell count, unspecified: Secondary | ICD-10-CM

## 2020-05-23 DIAGNOSIS — Z66 Do not resuscitate: Secondary | ICD-10-CM | POA: Diagnosis present

## 2020-05-23 DIAGNOSIS — I10 Essential (primary) hypertension: Secondary | ICD-10-CM | POA: Diagnosis present

## 2020-05-23 DIAGNOSIS — G620 Drug-induced polyneuropathy: Secondary | ICD-10-CM | POA: Diagnosis not present

## 2020-05-23 DIAGNOSIS — D61818 Other pancytopenia: Secondary | ICD-10-CM | POA: Diagnosis present

## 2020-05-23 DIAGNOSIS — G629 Polyneuropathy, unspecified: Secondary | ICD-10-CM

## 2020-05-23 DIAGNOSIS — Z95828 Presence of other vascular implants and grafts: Secondary | ICD-10-CM

## 2020-05-23 DIAGNOSIS — F40240 Claustrophobia: Secondary | ICD-10-CM | POA: Diagnosis present

## 2020-05-23 DIAGNOSIS — E785 Hyperlipidemia, unspecified: Secondary | ICD-10-CM | POA: Diagnosis not present

## 2020-05-23 DIAGNOSIS — R4182 Altered mental status, unspecified: Secondary | ICD-10-CM

## 2020-05-23 DIAGNOSIS — E861 Hypovolemia: Secondary | ICD-10-CM | POA: Diagnosis not present

## 2020-05-23 LAB — CMP (CANCER CENTER ONLY)
ALT: 9 U/L (ref 0–44)
AST: 24 U/L (ref 15–41)
Albumin: 3.4 g/dL — ABNORMAL LOW (ref 3.5–5.0)
Alkaline Phosphatase: 101 U/L (ref 38–126)
Anion gap: 9 (ref 5–15)
BUN: 21 mg/dL (ref 8–23)
CO2: 19 mmol/L — ABNORMAL LOW (ref 22–32)
Calcium: 8.7 mg/dL — ABNORMAL LOW (ref 8.9–10.3)
Chloride: 109 mmol/L (ref 98–111)
Creatinine: 1.31 mg/dL — ABNORMAL HIGH (ref 0.61–1.24)
GFR, Est AFR Am: >60 mL/min (ref >60)
GFR, Estimated: 54 mL/min — ABNORMAL LOW (ref >60)
Glucose, Bld: 146 mg/dL — ABNORMAL HIGH (ref 70–99)
Potassium: 4.1 mmol/L (ref 3.5–5.1)
Sodium: 137 mmol/L (ref 135–145)
Total Bilirubin: 0.6 mg/dL (ref 0.3–1.2)
Total Protein: 6.6 g/dL (ref 6.5–8.1)

## 2020-05-23 LAB — CBC WITH DIFFERENTIAL/PLATELET
Abs Immature Granulocytes: 0.02 10*3/uL (ref 0.00–0.07)
Basophils Absolute: 0 10*3/uL (ref 0.0–0.1)
Basophils Relative: 0 %
Eosinophils Absolute: 0 10*3/uL (ref 0.0–0.5)
Eosinophils Relative: 0 %
HCT: 12.4 % — ABNORMAL LOW (ref 39.0–52.0)
Hemoglobin: 4.2 g/dL — CL (ref 13.0–17.0)
Immature Granulocytes: 2 %
Lymphocytes Relative: 69 %
Lymphs Abs: 0.8 10*3/uL (ref 0.7–4.0)
MCH: 33.9 pg (ref 26.0–34.0)
MCHC: 33.9 g/dL (ref 30.0–36.0)
MCV: 100 fL (ref 80.0–100.0)
Monocytes Absolute: 0 10*3/uL — ABNORMAL LOW (ref 0.1–1.0)
Monocytes Relative: 3 %
Neutro Abs: 0.3 10*3/uL — ABNORMAL LOW (ref 1.7–7.7)
Neutrophils Relative %: 26 %
Platelets: 15 10*3/uL — CL (ref 150–400)
RBC: 1.24 MIL/uL — ABNORMAL LOW (ref 4.22–5.81)
RDW: 16.9 % — ABNORMAL HIGH (ref 11.5–15.5)
WBC: 1.1 10*3/uL — CL (ref 4.0–10.5)
nRBC: 0 % (ref 0.0–0.2)

## 2020-05-23 LAB — CBC WITH DIFFERENTIAL (CANCER CENTER ONLY)
Abs Immature Granulocytes: 0 10*3/uL (ref 0.00–0.07)
Band Neutrophils: 0 %
Basophils Absolute: 0 10*3/uL (ref 0.0–0.1)
Basophils Relative: 0 %
Blasts: 0 %
Eosinophils Absolute: 0 10*3/uL (ref 0.0–0.5)
Eosinophils Relative: 0 %
HCT: 12.9 % — ABNORMAL LOW (ref 39.0–52.0)
Hemoglobin: 4.4 g/dL — CL (ref 13.0–17.0)
Lymphocytes Relative: 76 %
Lymphs Abs: 1.1 10*3/uL (ref 0.7–4.0)
MCH: 33.3 pg (ref 26.0–34.0)
MCHC: 34.1 g/dL (ref 30.0–36.0)
MCV: 97.7 fL (ref 80.0–100.0)
Metamyelocytes Relative: 0 %
Monocytes Absolute: 0 10*3/uL — ABNORMAL LOW (ref 0.1–1.0)
Monocytes Relative: 3 %
Myelocytes: 0 %
Neutro Abs: 0.3 10*3/uL — CL (ref 1.7–7.7)
Neutrophils Relative %: 21 %
Other: 0 %
Platelet Count: 14 10*3/uL — ABNORMAL LOW (ref 150–400)
Promyelocytes Relative: 0 %
RBC: 1.32 MIL/uL — ABNORMAL LOW (ref 4.22–5.81)
RDW: 16.9 % — ABNORMAL HIGH (ref 11.5–15.5)
WBC Count: 1.4 10*3/uL — ABNORMAL LOW (ref 4.0–10.5)
nRBC: 0 % (ref 0.0–0.2)
nRBC: 0 /100 WBC

## 2020-05-23 LAB — SAMPLE TO BLOOD BANK

## 2020-05-23 LAB — COMPREHENSIVE METABOLIC PANEL
ALT: 12 U/L (ref 0–44)
AST: 23 U/L (ref 15–41)
Albumin: 3.4 g/dL — ABNORMAL LOW (ref 3.5–5.0)
Alkaline Phosphatase: 86 U/L (ref 38–126)
Anion gap: 13 (ref 5–15)
BUN: 23 mg/dL (ref 8–23)
CO2: 19 mmol/L — ABNORMAL LOW (ref 22–32)
Calcium: 8.8 mg/dL — ABNORMAL LOW (ref 8.9–10.3)
Chloride: 106 mmol/L (ref 98–111)
Creatinine, Ser: 1.16 mg/dL (ref 0.61–1.24)
GFR calc Af Amer: >60 mL/min (ref >60)
GFR calc non Af Amer: >60 mL/min (ref >60)
Glucose, Bld: 122 mg/dL — ABNORMAL HIGH (ref 70–99)
Potassium: 4.2 mmol/L (ref 3.5–5.1)
Sodium: 138 mmol/L (ref 135–145)
Total Bilirubin: 0.8 mg/dL (ref 0.3–1.2)
Total Protein: 6.7 g/dL (ref 6.5–8.1)

## 2020-05-23 LAB — PREPARE RBC (CROSSMATCH)

## 2020-05-23 LAB — SARS CORONAVIRUS 2 BY RT PCR (HOSPITAL ORDER, PERFORMED IN ~~LOC~~ HOSPITAL LAB): SARS Coronavirus 2: NEGATIVE

## 2020-05-23 LAB — MAGNESIUM: Magnesium: 2.1 mg/dL (ref 1.7–2.4)

## 2020-05-23 IMAGING — MR MR HEAD W/O CM
10 series · 48 of 48 positions shown · non-contrast
Comparison: None.

CLINICAL DATA: 73-year-old male with altered mental status.
Noncontrast head CT suspicious for trace subarachnoid hemorrhage
over the left anterior frontal convexity. Increased confusion,
weakness. Metastatic prostate cancer. Pancytopenia.

EXAM:
MRI HEAD WITHOUT CONTRAST
TECHNIQUE: Multiplanar, multiecho pulse sequences of the brain and surrounding
structures were obtained without intravenous contrast.

[Series 9: dwi_tracew · axial · 3.0mm · 1.08mm/px · z∈[-67,+92]mm · 10 of 108 slices shown (1 of 2)]
[im 1/108]
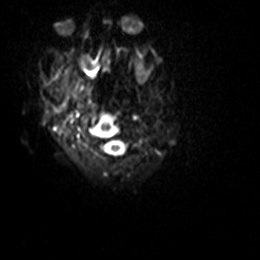
[im 12/108]
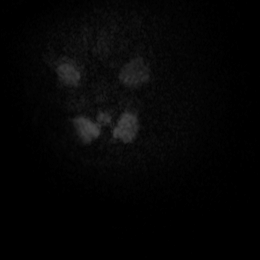
[im 24/108]
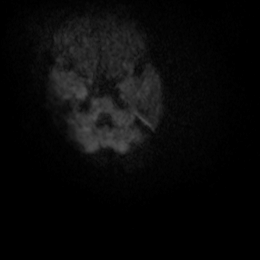
[im 36/108]
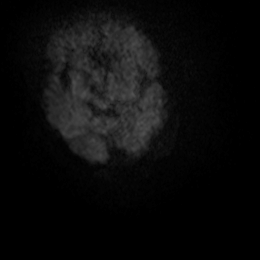
[im 48/108]
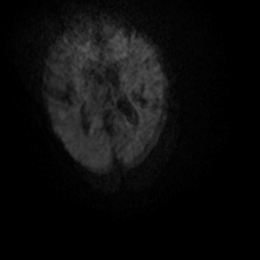
[im 60/108]
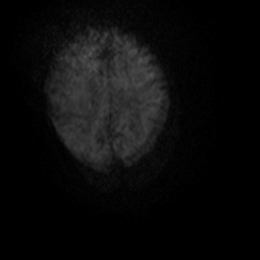
[im 72/108]
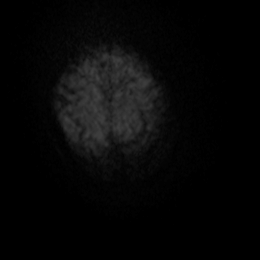
[im 84/108]
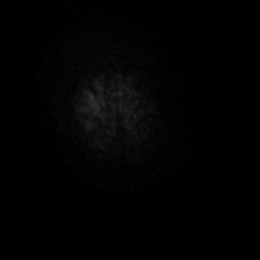
[im 96/108]
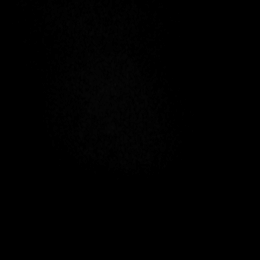
[im 108/108]
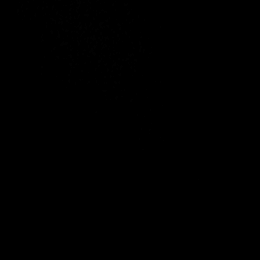

[Series 10: dwi_adc · axial · 3.0mm · 1.08mm/px · z∈[-67,+74]mm · 5 of 48 slices shown (1 of 2)]
[im 1/48]
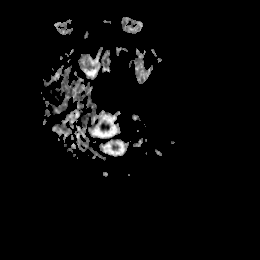
[im 12/48]
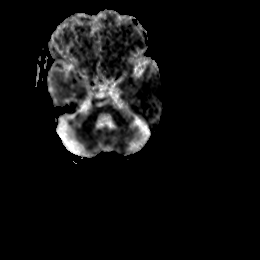
[im 24/48]
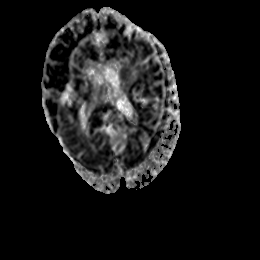
[im 36/48]
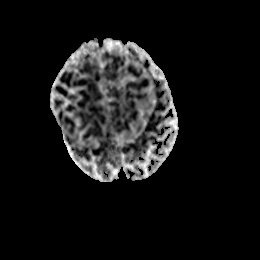
[im 48/48]
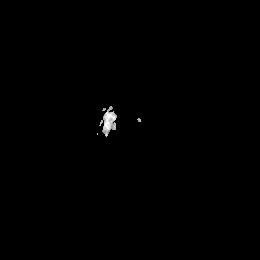

[Series 11: dwi_tracew · axial · 3.0mm · 1.08mm/px · z∈[-67,+92]mm · 9 of 108 slices shown (2 of 2)]
[im 1/108]
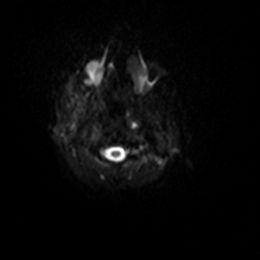
[im 14/108]
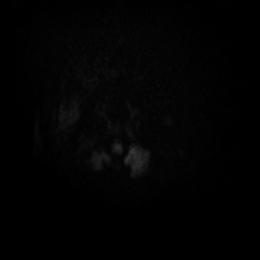
[im 27/108]
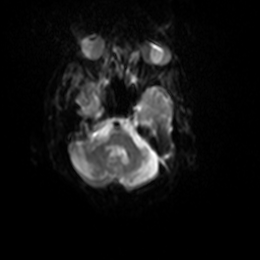
[im 41/108]
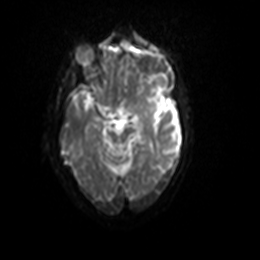
[im 54/108]
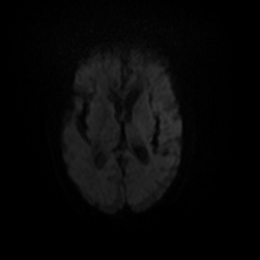
[im 67/108]
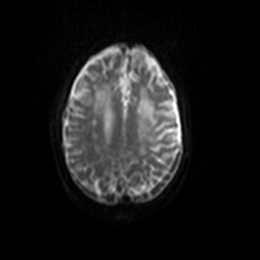
[im 81/108]
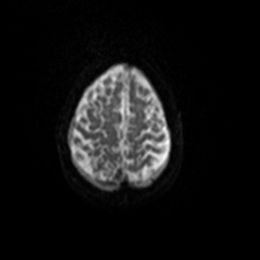
[im 94/108]
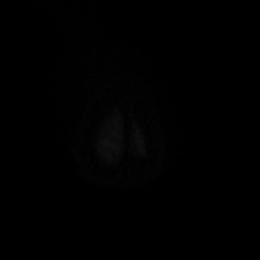
[im 108/108]
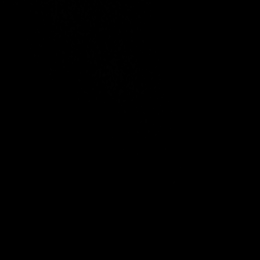

[Series 12: dwi_adc · axial · 3.0mm · 1.08mm/px · z∈[-67,+77]mm · 4 of 49 slices shown (2 of 2)]
[im 1/49]
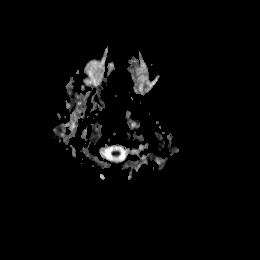
[im 17/49]
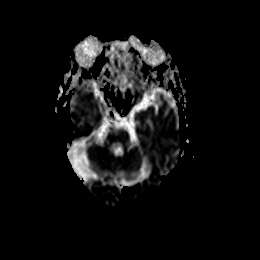
[im 33/49]
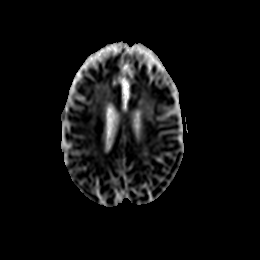
[im 49/49]
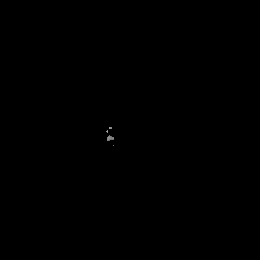

[Series 13: T2 · sagittal · 5.0mm · 0.47mm/px · 2 of 26 slices shown (1 of 2)]
[im 1/26]
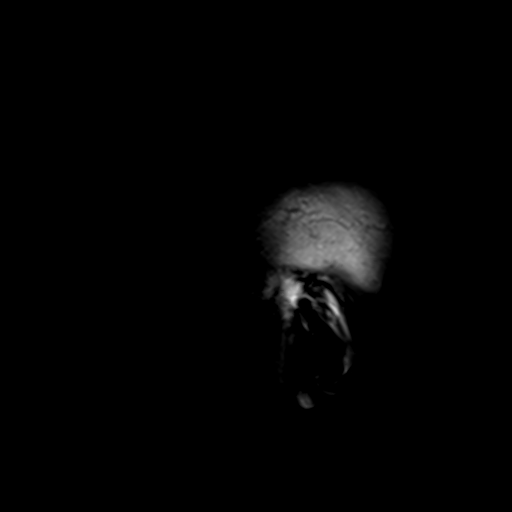
[im 26/26]
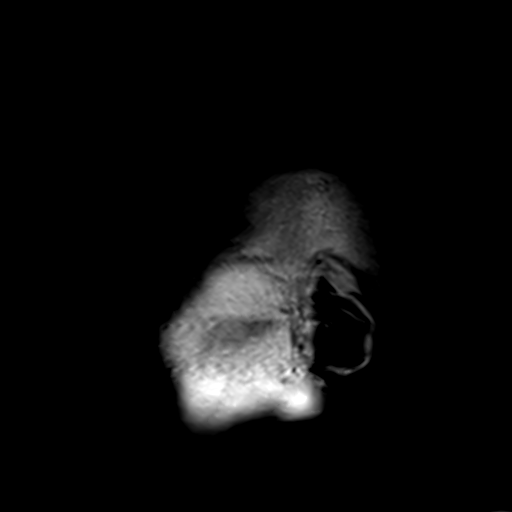

[Series 14: T2 · axial · 5.0mm · 0.45mm/px · z∈[-58,+104]mm · 2 of 26 slices shown (2 of 2)]
[im 1/26]
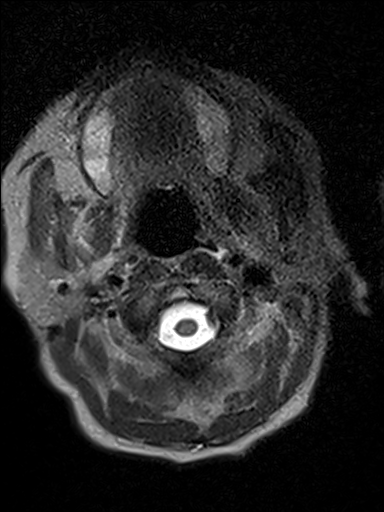
[im 26/26]
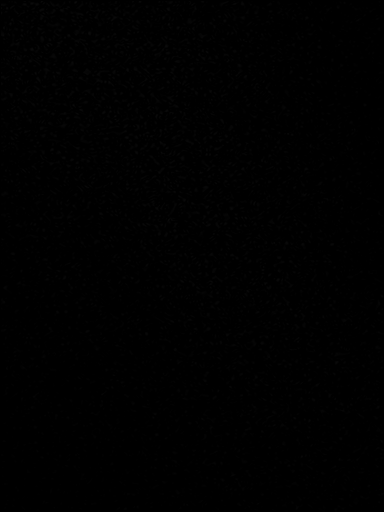

[Series 15: GRE · axial · 3.0mm · 0.45mm/px · z∈[-67,+92]mm · 5 of 54 slices shown]
[im 1/54]
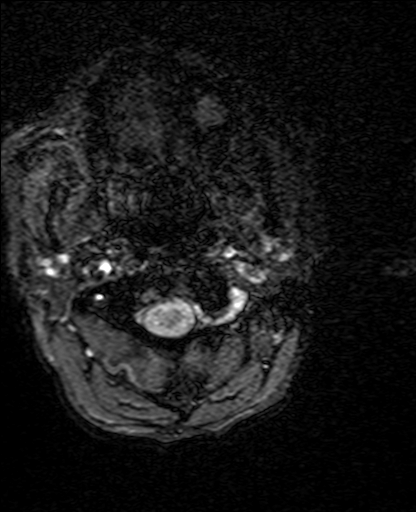
[im 14/54]
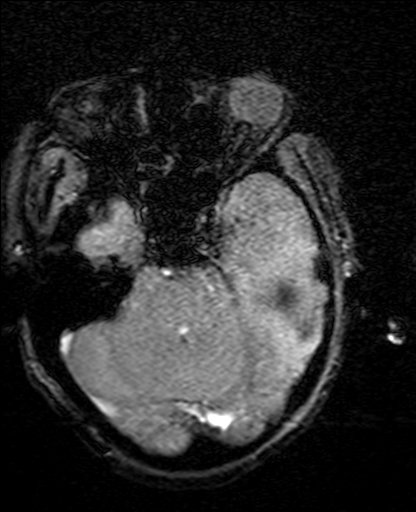
[im 27/54]
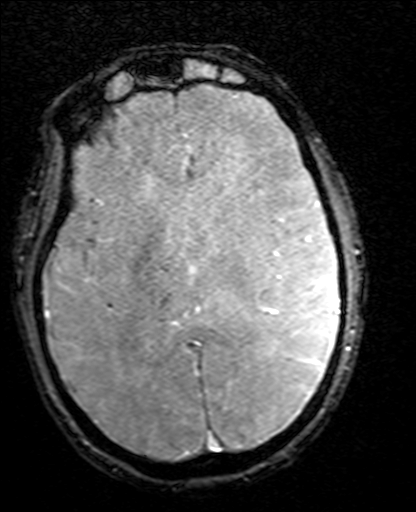
[im 40/54]
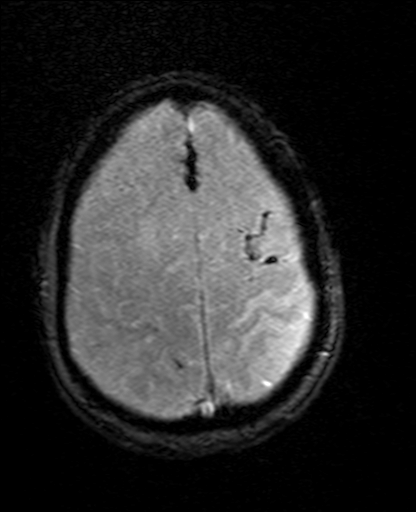
[im 54/54]
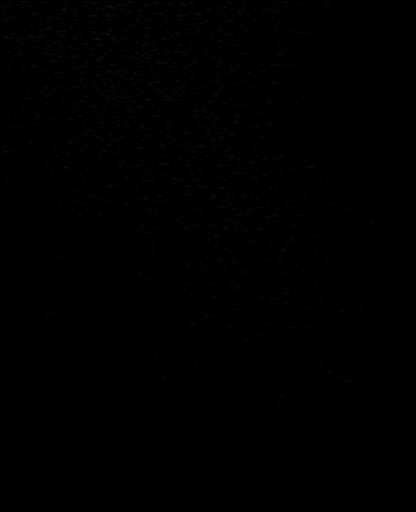

[Series 16: FLAIR · axial · 3.0mm · 0.86mm/px · z∈[-59,+90]mm · 4 of 51 slices shown]
[im 1/51]
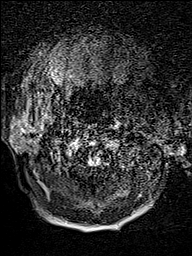
[im 17/51]
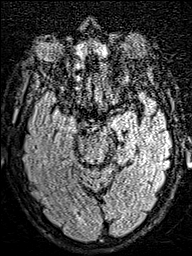
[im 34/51]
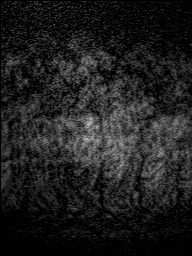
[im 51/51]
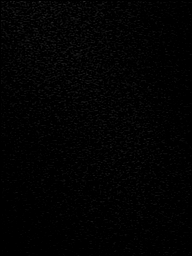

[Series 17: DWI · coronal · 5.0mm · 1.31mm/px · 5 of 60 slices shown (1 of 2)]
[im 1/60]
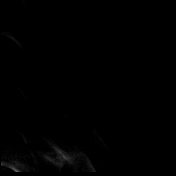
[im 15/60]
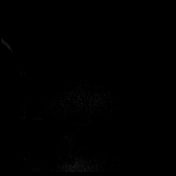
[im 30/60]
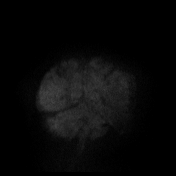
[im 45/60]
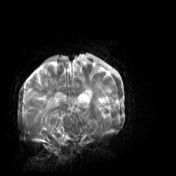
[im 60/60]
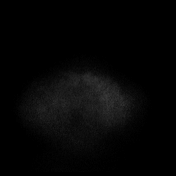

[Series 18: DWI · coronal · 5.0mm · 1.31mm/px · 2 of 21 slices shown (2 of 2)]
[im 1/21]
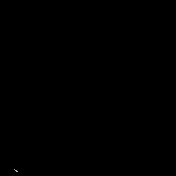
[im 21/21]
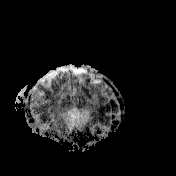

[48 of 48 positions shown; findings below may reference images not displayed]

FINDINGS: The examination had to be discontinued prior to completion, and is
significantly motion degraded due to patient confusion. Axial FLAIR
and coronal DWI imaging is largely nondiagnostic. Axial T1 and
coronal T2 weighted imaging could not be obtained.

Brain: No definite restricted diffusion to suggest acute infarction.

Axial T2* imaging demonstrates evidence of left superior frontal
convexity subarachnoid hemorrhage as suspected by CT (series 15,
images 38 and 41), as well as scattered microhemorrhage elsewhere in
both cerebral hemispheres. No intraventricular blood is evident. No
ventriculomegaly. No intracranial mass effect or midline shift.

Patchy and widespread bilateral cerebral white matter T2 (and FLAIR)
hyperintensity with areas most resembling chronic lacunar infarcts
in the bilateral corona radiata and bilateral basal ganglia. Mild
thalamic involvement on the right.

No midline shift, mass effect, evidence of mass lesion,
ventriculomegaly,. Cervicomedullary junction and pituitary are
grossly normal.

Vascular: Major intracranial vascular flow voids are preserved, with
a degree of generalized intracranial artery tortuosity.

Skull and upper cervical spine: Diffusely abnormal marrow signal in
the visible upper cervical spine, and asymmetric decreased calvarium
marrow signal on the right. Abnormal marrow signal throughout the
visible mandible.

Sinuses/Orbits: Bilateral paranasal sinus fluid and/or mucosal
thickening, relatively sparing the hyperplastic left sphenoid sinus.
No acute orbit finding.

Other: Mastoids are well pneumatized. Grossly normal visible
internal auditory structures.
IMPRESSION: 1. Truncated and intermittently motion degraded exam. No definite
acute infarct.

2. Evidence of small volume of convexity subarachnoid hemorrhage as
on the earlier CT. Although nonspecific, a similar pattern can be
seen with minor trauma (such as fall) in elderly or coagulopathic
patients.

3. Evidence of underlying chronic small vessel disease, including
some parenchymal microhemorrhages.

4. Osseous metastatic disease, most apparent in the mandible and
upper cervical spine.

5. Evidence of bilateral paranasal sinusitis.

## 2020-05-23 IMAGING — CT CT ABD-PELV W/ CM
2 of 5 series · 15 of 46 positions shown, 17 images · IV contrast (omnipaque)
Comparison: [DATE]

CLINICAL DATA: Metastatic prostate cancer, anemia, generalized
weakness, acute nonlocalized abdominal pain

EXAM:
CT ABDOMEN AND PELVIS WITH CONTRAST
TECHNIQUE: Multidetector CT imaging of the abdomen and pelvis was performed
using the standard protocol following bolus administration of
intravenous contrast.
CONTRAST:  100mL OMNIPAQUE IOHEXOL 300 MG/ML  SOLN

[Series 3: axial st · axial · 0.86mm/px · z∈[+862,+1238]mm · 12 of 89 slices shown, 14 images]
[im 7/89  soft-tissue]
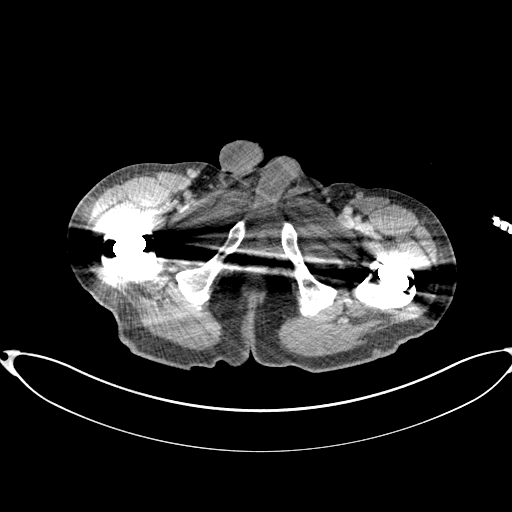
[im 7/89  bone]
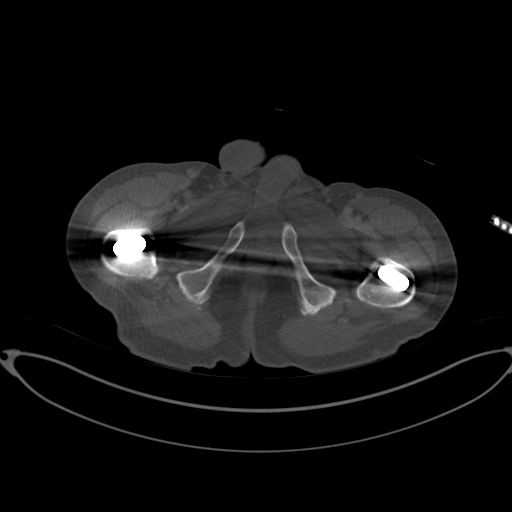
[im 13/89  soft-tissue]
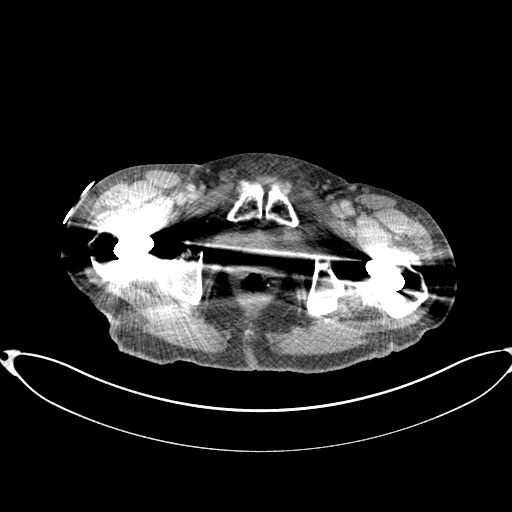
[im 19/89  soft-tissue]
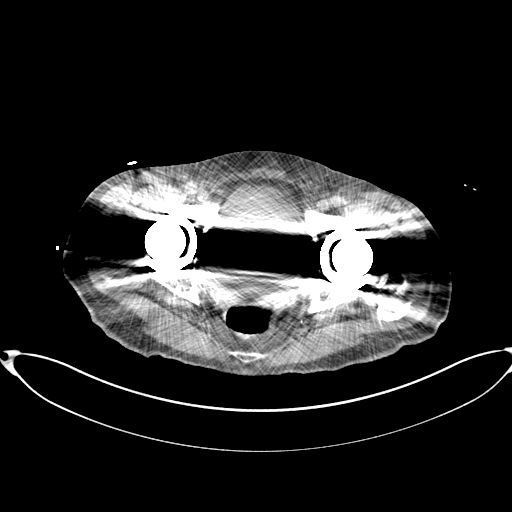
[im 26/89  soft-tissue]
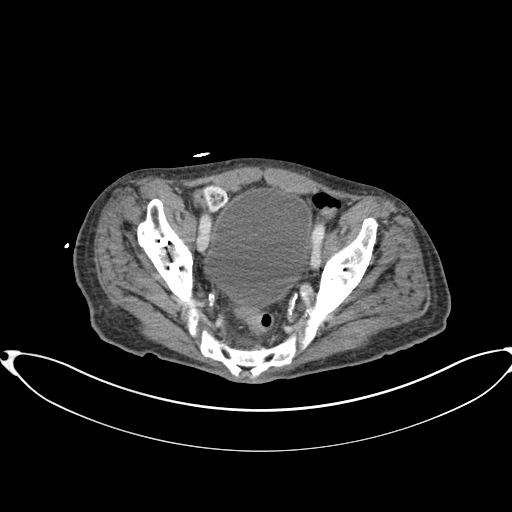
[im 32/89  soft-tissue]
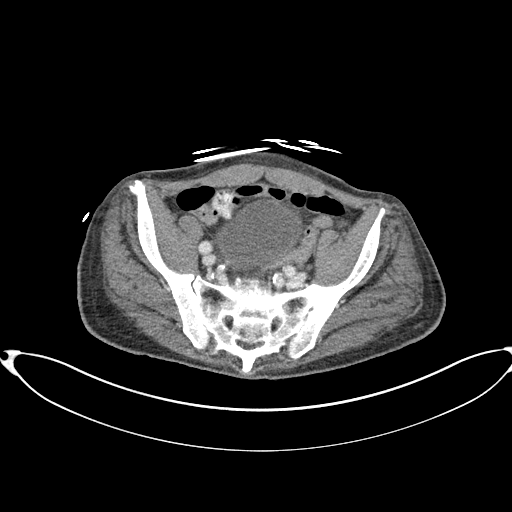
[im 38/89  soft-tissue]
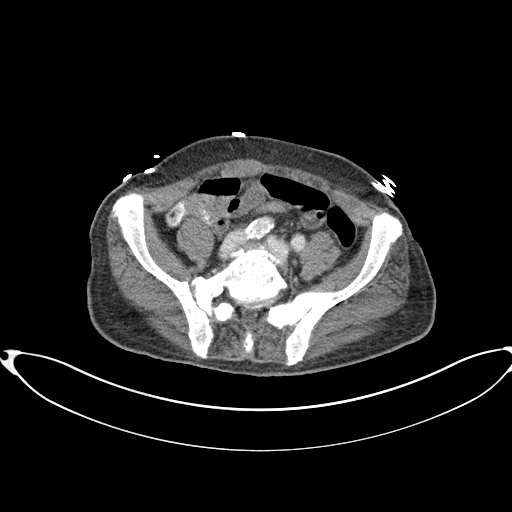
[im 51/89  soft-tissue]
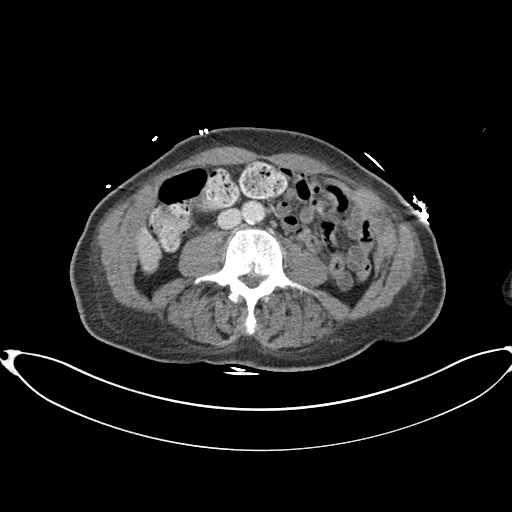
[im 57/89  soft-tissue]
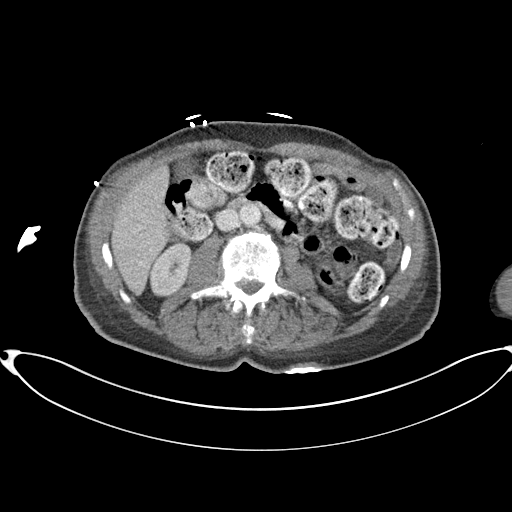
[im 63/89  soft-tissue]
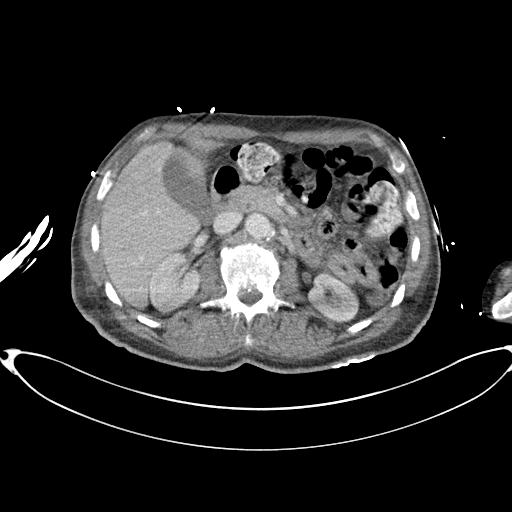
[im 63/89  bone]
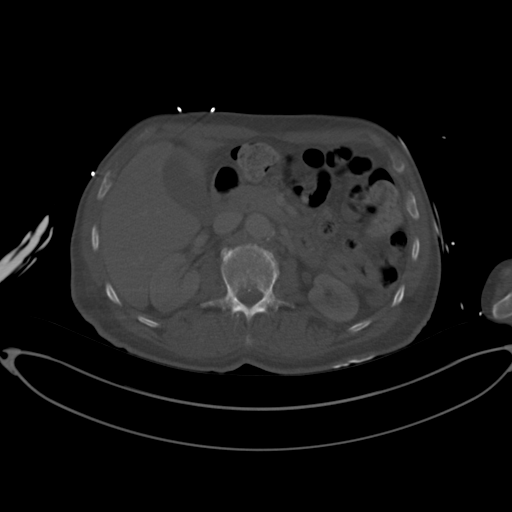
[im 70/89  soft-tissue]
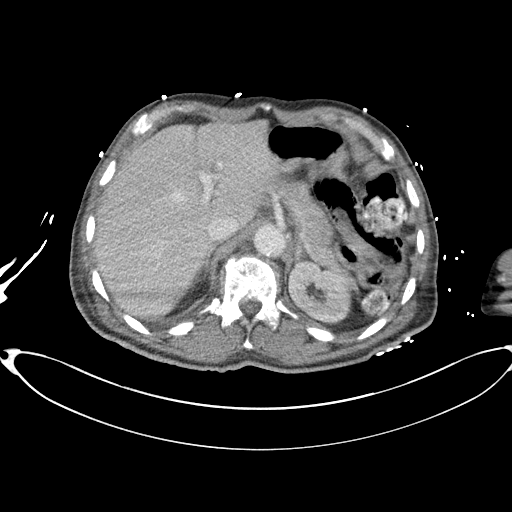
[im 76/89  soft-tissue]
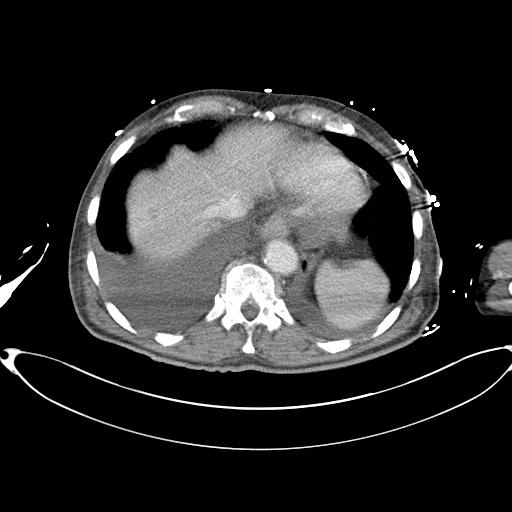
[im 82/89  soft-tissue]
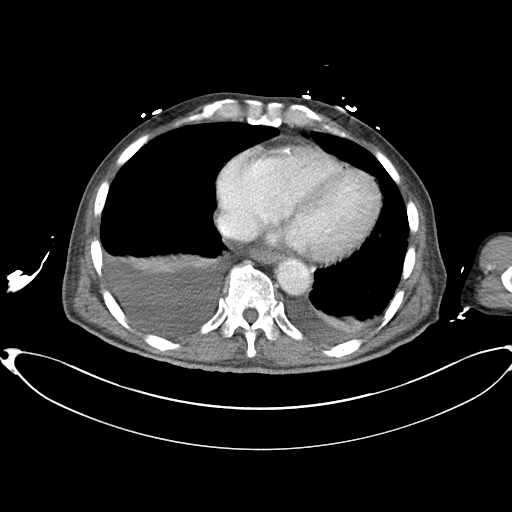

[Series 6: coronal st · coronal · 0.72mm/px · 3 of 117 slices shown]
[im 39/117  soft-tissue]
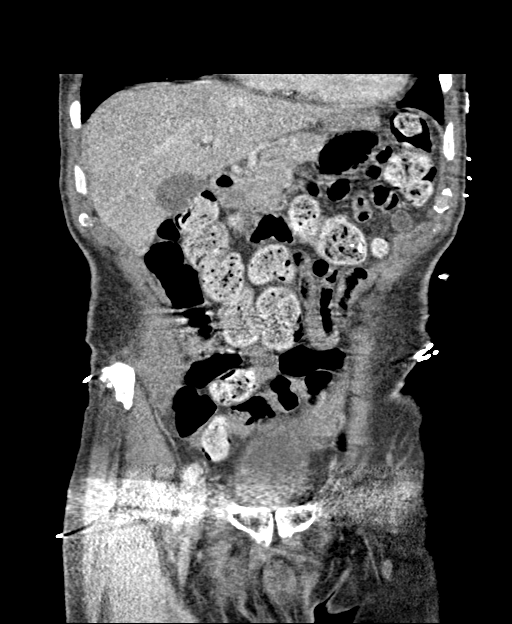
[im 52/117  soft-tissue]
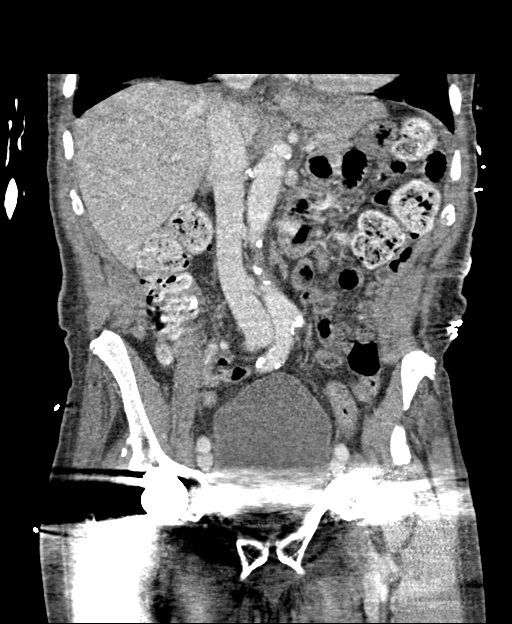
[im 65/117  soft-tissue]
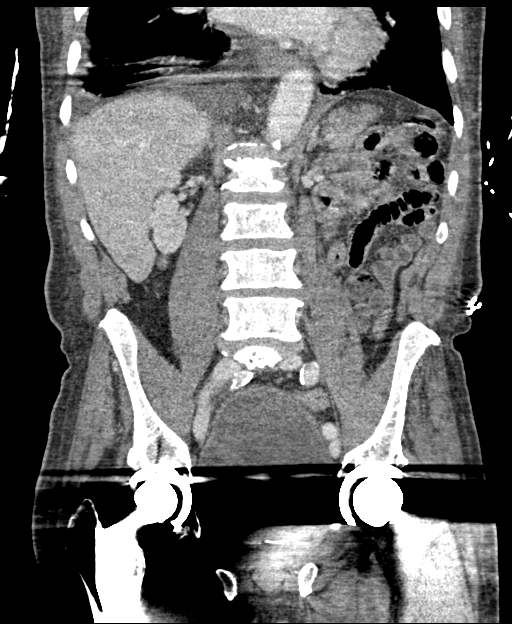

[15 of 46 positions shown; findings below may reference images not displayed]

FINDINGS: Lower chest: Moderate right and small left pleural effusions are
partially visualized with associated bibasilar compressive
atelectasis. Multiple pulmonary nodules are identified within the
visualized lung bases, not well characterized on this examination.
Moderate right coronary artery calcification. Global cardiac size
within normal limits.

Hepatobiliary: No focal liver abnormality is seen. No gallstones,
gallbladder wall thickening, or biliary dilatation.

Pancreas: Un remark

Spleen: Unremarkable

Adrenals/Urinary Tract: Adrenal glands are unremarkable. Kidneys are
normal, without renal calculi, focal lesion, or hydronephrosis.
Bladder is partially obscured by streak artifact from bilateral hip
prostheses, but is otherwise unremarkable.

Stomach/Bowel: Stomach, small bowel, and large bowel are
unremarkable. Appendix normal. No free intraperitoneal gas or fluid.

Vascular/Lymphatic: The abdominal vasculature is age-appropriate
with mild aortoiliac atherosclerotic calcification. No aneurysm. No
pathologic adenopathy within the abdomen and pelvis.

Reproductive: The prostate gland is largely obscured by streak
artifact. Brachytherapy seeds are noted, however.

Other: Rectum unremarkable

Musculoskeletal: Bilateral total hip arthroplasty has been
performed. Since the prior examination, there have developed a mixed
lytic and sclerotic pattern within the T12-L3 vertebral bodies which
is indeterminate. Possible erosive changes involving the right
transverse process of L2, best seen on axial image # 32/3.
Additionally, fractures of the superior endplate of T12 and L1 and
L2 have developed, likely subacute to chronic in nature though new
since prior examination. Stable grade 1 anterolisthesis of L4 upon
L5.
IMPRESSION: 1. Moderate right and small left pleural effusions are partially
visualized with associated bibasilar compressive atelectasis.
2. Multiple pulmonary nodules within the visualized lung bases, not
well characterized on this examination. Findings are concerning for
metastatic disease.
3. Interval development of a mixed lytic and sclerotic pattern
within the T12-L3 vertebral bodies which is indeterminate.
Additionally, fractures of the superior endplate of T12 and L1 and
L2 have developed, likely subacute to chronic in nature though new
since prior examination. This can be seen in the setting of marrow
infiltrative processes, particularly given the possible erosive
change involving the L2 vertebral body. Contrast enhanced MRI
examination may be helpful to further evaluate this.

Aortic Atherosclerosis ([AD]-[AD]).

## 2020-05-23 IMAGING — CT CT HEAD W/O CM
3 series · 14 of 47 positions shown, 16 images · non-contrast
Comparison: None.

CLINICAL DATA: Altered mental status

EXAM:
CT HEAD WITHOUT CONTRAST
TECHNIQUE: Contiguous axial images were obtained from the base of the skull
through the vertex without intravenous contrast.

[Series 3: head wo · axial · 0.42mm/px · z∈[+1566,+1691]mm · 8 of 30 slices shown, 10 images]
[im 3/30  brain]
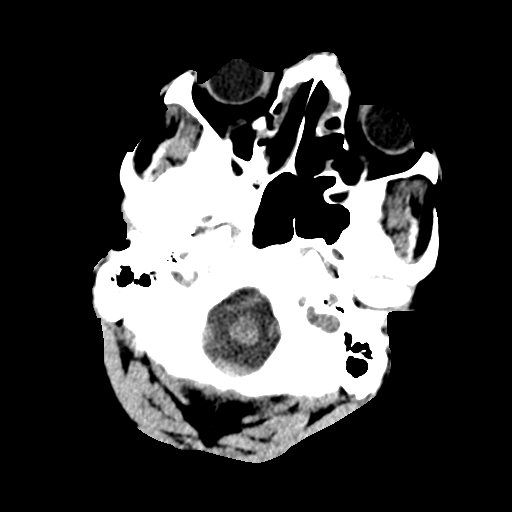
[im 3/30  bone]
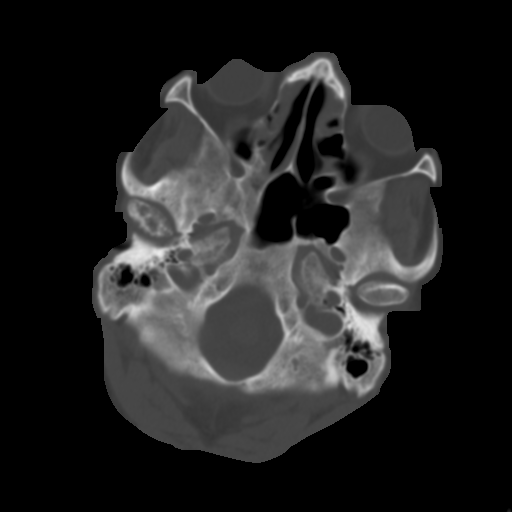
[im 7/30  brain]
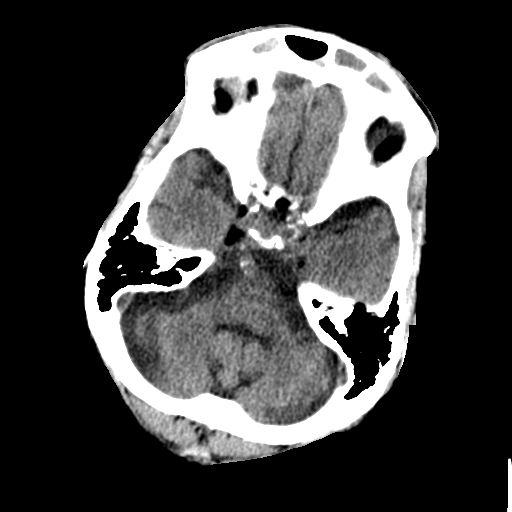
[im 10/30  brain]
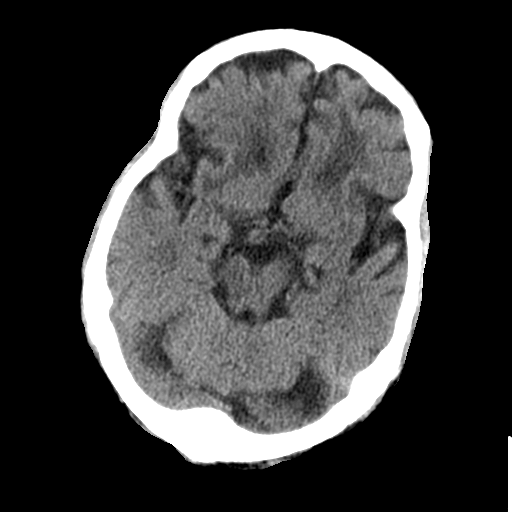
[im 14/30  brain]
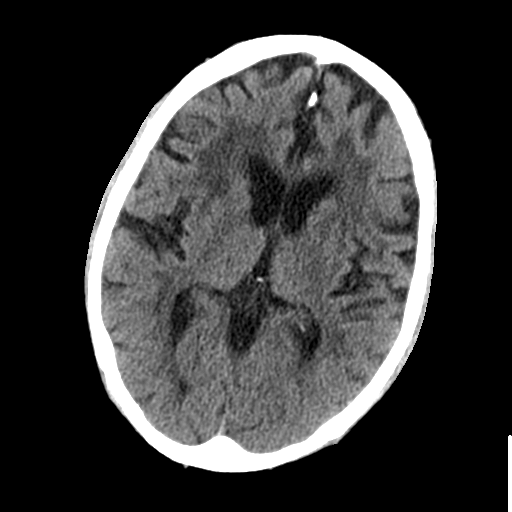
[im 17/30  brain]
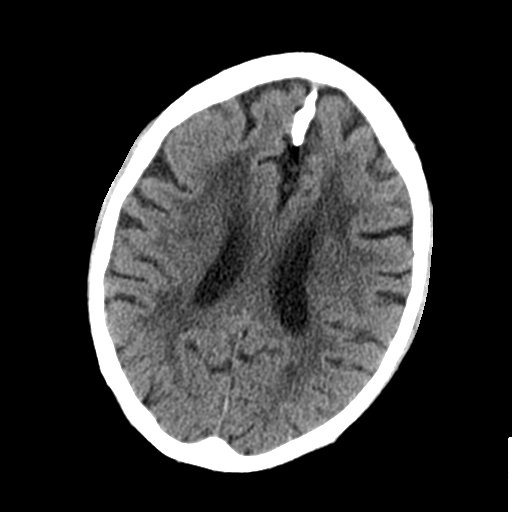
[im 17/30  bone]
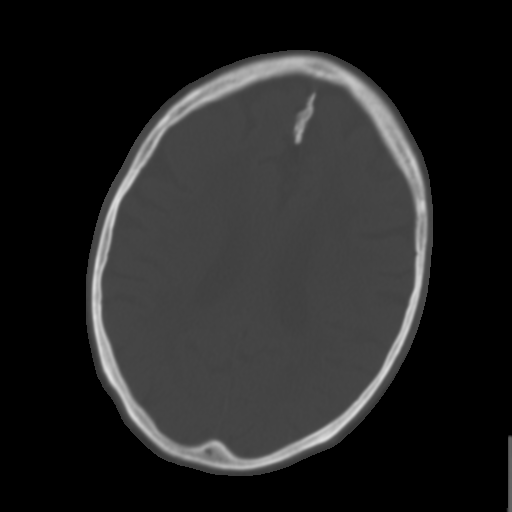
[im 21/30  brain]
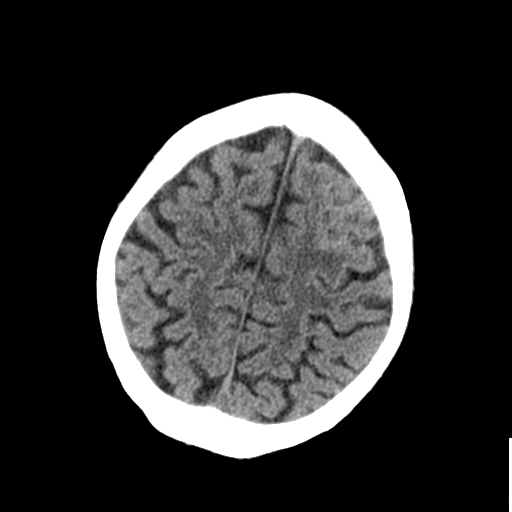
[im 24/30  brain]
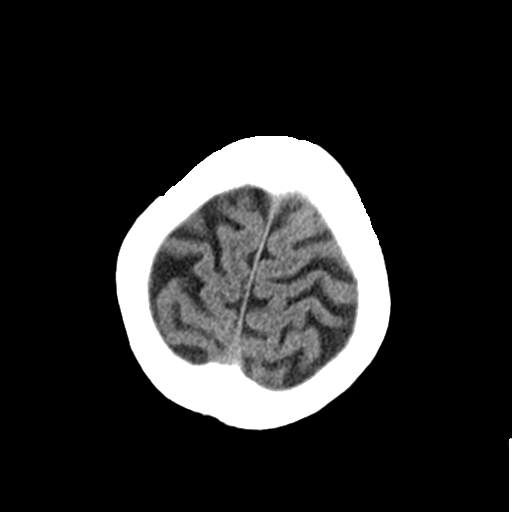
[im 28/30  brain]
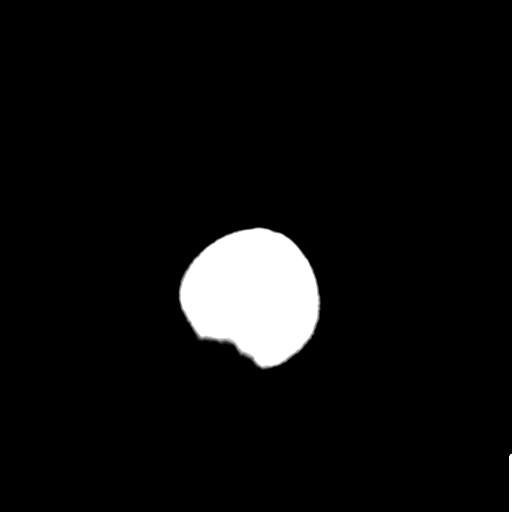

[Series 6: coronal soft tissue · coronal · 0.29mm/px · 3 of 67 slices shown]
[im 23/67  brain]
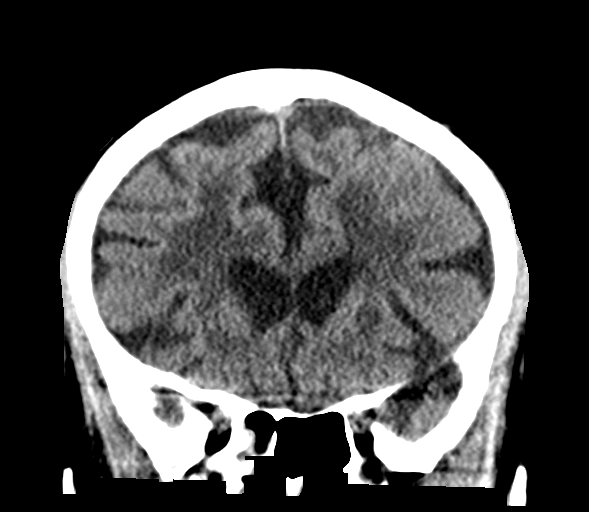
[im 30/67  brain]
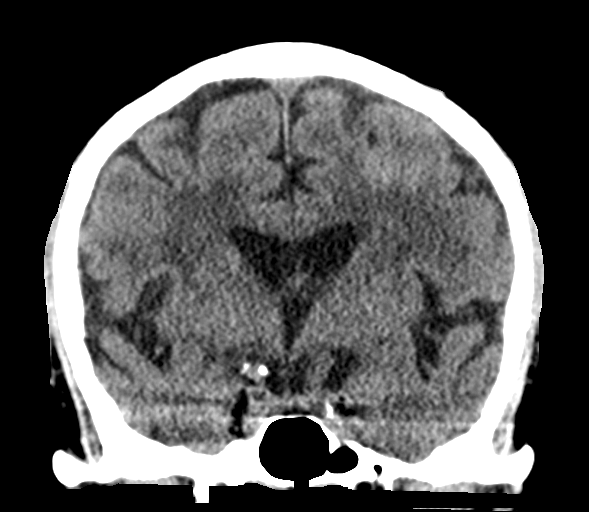
[im 37/67  brain]
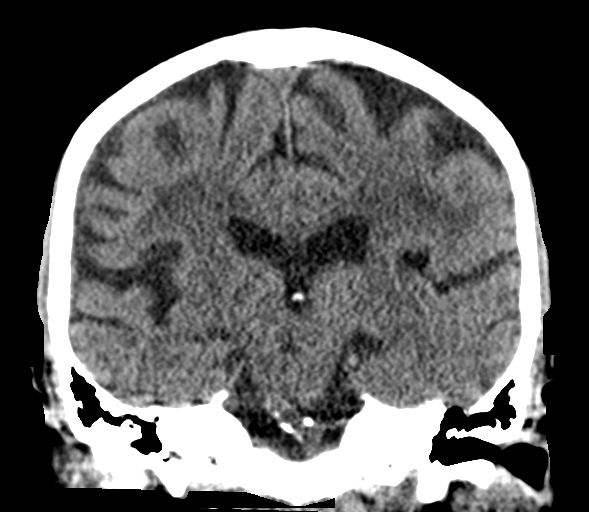

[Series 7: sagittal soft tissue · sagittal · 0.29mm/px · 3 of 54 slices shown]
[im 18/54  brain]
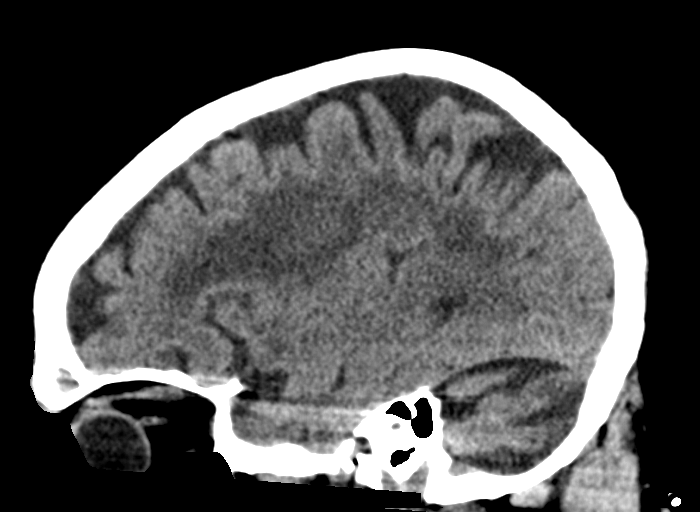
[im 27/54  brain]
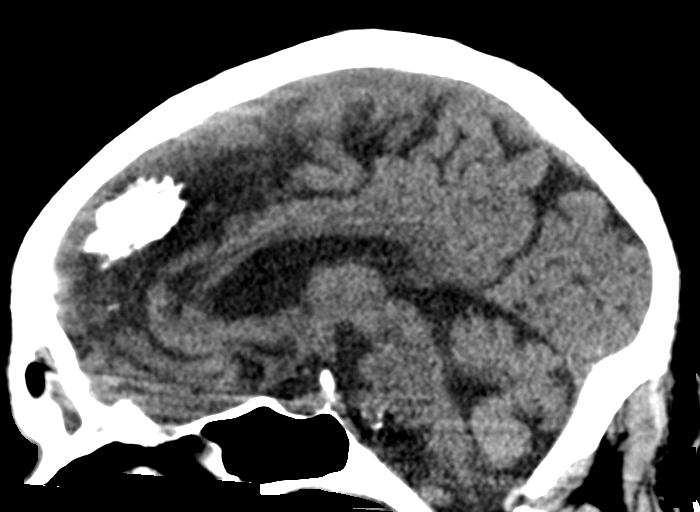
[im 36/54  brain]
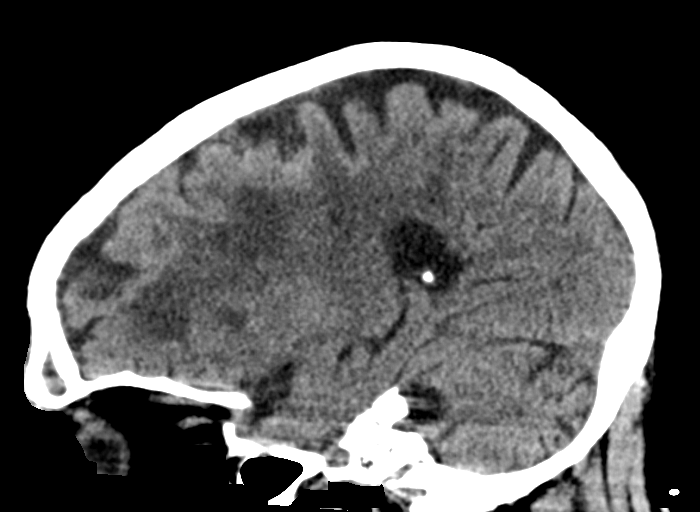

[14 of 47 positions shown; findings below may reference images not displayed]

FINDINGS: Brain: Normal anatomic configuration. Mild parenchymal volume loss
is commensurate with the patient's age. Extensive subcortical and
periventricular white matter changes are present likely reflecting
the sequela of small vessel ischemia. Remote lacunar infarct noted
within the left caudate nucleus.

A small amount of a subtle subarachnoid hemorrhage is seen
interdigitating within the sulci of the left frontal lobe. There is
no associated abnormal mass effect or midline shift. No abnormal
intra or extra-axial mass lesion. Ventricular size is normal.
Cerebellum unremarkable.

Vascular: Unremarkable

Skull: Intact

Sinuses/Orbits: There is near complete opacification of the ethmoid
air cells bilaterally and frontal sinuses. Moderate mucosal
thickening is seen within the visualized maxillary sinuses and right
sphenoid sinus. The orbits are unremarkable.

Other: Mastoid air cells and middle ear cavities are clear. Erosive
changes are seen within the a right condylar process of the
mandible, not fully characterized on this examination.
IMPRESSION: 1. Small amount of subtle subarachnoid hemorrhage interdigitating
within the sulci of the left frontal lobe. No associated abnormal
mass effect or midline shift. Contrast enhanced MRI examination may
be helpful to confirm this finding assess for alternate causes of
subarachnoid hemorrhage in absence of a history of trauma.
2. Pansinusitis.
3. Erosive changes within the right condylar process of the
mandible, not fully characterized on this examination.
4. These results were called by telephone at the time of
interpretation on [DATE] at [DATE] to provider JAGUA
JAGUA , who verbally acknowledged these results.

## 2020-05-23 MED ORDER — SODIUM CHLORIDE 0.9% FLUSH
10.0000 mL | INTRAVENOUS | Status: DC | PRN
  Administered 2020-05-23: 10 mL via INTRAVENOUS
  Filled 2020-05-23: qty 10

## 2020-05-23 MED ORDER — PSYLLIUM 95 % PO PACK
1.0000 | PACK | Freq: Every day | ORAL | Status: DC
  Administered 2020-05-24: 1 via ORAL
  Filled 2020-05-23: qty 1

## 2020-05-23 MED ORDER — LORAZEPAM 2 MG/ML IJ SOLN
0.5000 mg | Freq: Once | INTRAMUSCULAR | Status: AC | PRN
  Administered 2020-05-23: 0.5 mg via INTRAVENOUS
  Filled 2020-05-23: qty 1

## 2020-05-23 MED ORDER — SODIUM CHLORIDE 0.9 % IV SOLN
10.0000 mL/h | Freq: Once | INTRAVENOUS | Status: AC
  Administered 2020-05-24: 10 mL/h via INTRAVENOUS

## 2020-05-23 MED ORDER — POLYETHYLENE GLYCOL 3350 17 G PO PACK
17.0000 g | PACK | Freq: Every day | ORAL | Status: DC
  Administered 2020-05-24: 17 g via ORAL
  Filled 2020-05-23: qty 1

## 2020-05-23 MED ORDER — PANTOPRAZOLE SODIUM 40 MG PO TBEC
40.0000 mg | DELAYED_RELEASE_TABLET | Freq: Every day | ORAL | Status: DC
  Administered 2020-05-24: 40 mg via ORAL
  Filled 2020-05-23: qty 1

## 2020-05-23 MED ORDER — SENNOSIDES-DOCUSATE SODIUM 8.6-50 MG PO TABS: 2.0000 | ORAL_TABLET | Freq: Every day | ORAL | Status: DC | PRN

## 2020-05-23 MED ORDER — IOHEXOL 300 MG/ML  SOLN
100.0000 mL | Freq: Once | INTRAMUSCULAR | Status: AC | PRN
  Administered 2020-05-23: 100 mL via INTRAVENOUS

## 2020-05-23 MED ORDER — GABAPENTIN 300 MG PO CAPS: 300.0000 mg | ORAL_CAPSULE | Freq: Every morning | ORAL | Status: DC

## 2020-05-23 MED ORDER — SIMVASTATIN 20 MG PO TABS
20.0000 mg | ORAL_TABLET | Freq: Every day | ORAL | Status: DC
  Filled 2020-05-23: qty 1

## 2020-05-23 MED ORDER — SODIUM CHLORIDE 0.9 % IV BOLUS
500.0000 mL | Freq: Once | INTRAVENOUS | Status: AC
  Administered 2020-05-23: 500 mL via INTRAVENOUS

## 2020-05-23 NOTE — ED Provider Notes (Addendum)
[place] DEPT Provider Note   CSN: [id number] Arrival date & time: 05/23/20  1542     History Chief Complaint  Patient presents with  . Anemia    Dennis Fischer is a 74 y.o. male with a history of prostate cancer with metastasis, anemia, hypertension, and hypercholesterolemia who presents to the emergency department from cancer center for blood transfusion due to anemia noted on labs today.  Patient states that he was sent here because he needs blood, he otherwise has no major complaints.  He states he feels a bit tired at times and has his baseline abdominal pain without acute change, but denies any nausea, vomiting, constipation, melena, hematochezia, dysuria, lightheadedness, dizziness, or syncope.  I called & spoke with patient's wife via telephone who states  the patient has had poor appetite, nausea, vomiting, & constipation, last BM was 1 week ago, she is unsure about passage of gas, he has also been more confused recently asking odd questions, he has had issues with hallucinations for awhile now which is not new.  Patient denies above.   Regarding patient's metastatic prostate cancer- he is on androgen deprivation therapy Eligard q 4 months and Lynparza 300 mg BID.     HPI     Past Medical History:  Diagnosis Date  . Acute blood loss anemia   . Hypertension   . Metastatic cancer to bone ([place]) dx'd 2018  . Prostate cancer [facility])     Patient Active Problem List   Diagnosis Date Noted  . Sore on ankle 04/04/2020  . Hypokalemia 08/11/2019  . Anemia in neoplastic disease 08/11/2019  . Dark urine 08/11/2019  . Sepsis ([place]) 08/03/2019  . Noninfectious colitis 08/03/2019  . Constipation 05/15/2019  . Elevated BUN   . Transaminitis   . Hypoalbuminemia due to protein-calorie malnutrition ([place])   . Steroid-induced hyperglycemia   . Essential hypertension   . Metastatic cancer to spine ([place]) 03/11/2019  . Neuropathic pain   . Benign essential  HTN   . Cocaine use 03/10/2019  . Cord compression myelopathy ([place])   . Spinal cord compression ([place]) 03/09/2019  . Prostate cancer metastatic to bone ([place]) 12/22/2018  . Port-A-Cath in place 07/02/2018  . Goals of care, counseling/discussion 01/20/2018  . Right knee pain 01/17/2018  . Prostate cancer ([place]) 01/22/2017  . Prediabetes 03/30/2016  . Positive FIT (fecal immunochemical test) 11/22/2015  . GERD (gastroesophageal reflux disease) 12/21/2014  . Age-related nuclear cataract of both eyes 01/13/2013  . Hypertensive retinopathy 01/13/2013  . Essential (primary) hypertension 11/25/2012  . Pure hypercholesterolemia 11/25/2012  . History of total hip replacement 08/28/2010  . Tobacco dependence syndrome 03/22/2006    Past Surgical History:  Procedure Laterality Date  . HIP SURGERY Bilateral   . IR IMAGING GUIDED PORT INSERTION  04/22/2018  . LAMINECTOMY N/A 03/09/2019   Procedure: THORACIC SEVEN - THORACIC EIGHT - THORACIC NINE LAMINECTOMY WITH RESECTION OF EPIDURAL TUMOR;  Surgeon: Earnie Larsson, MD;  Location: [facility];  Service: Neurosurgery;  Laterality: N/A;       Family History  Problem Relation Age of Onset  . Prostate cancer Father     Social History   Tobacco Use  . Smoking status: Former Smoker    Packs/day: 0.25    Years: 50.00    Pack years: 12.50    Types: Cigarettes    Quit date: 09/10/2014    Years since quitting: 5.7  . Smokeless tobacco: Never Used  . Tobacco comment: Reports smoking only  when he drank.  Vaping Use  . Vaping Use: Never used  Substance Use Topics  . Alcohol use: Yes    Comment: Once every 2 weeks.   . Drug use: Yes    Types: Marijuana    Comment: Medical     Home Medications Prior to Admission medications   Medication Sig Start Date End Date Taking? Authorizing Provider  acetaminophen (TYLENOL) 325 MG tablet Take 1-2 tablets (325-650 mg total) by mouth every 4 (four) hours as needed for mild pain. 03/27/19   Love, Ivan Anchors, PA-C    amLODipine (NORVASC) 10 MG tablet Take 1 tablet (10 mg total) by mouth daily. 02/19/20   [name], MD  CALCIUM 500/D 500-200 MG-UNIT tablet TAKE 1 TABLET BY MOUTH TWICE DAILY Patient taking differently: Take 1 tablet by mouth daily with breakfast.  05/21/19   Wyatt Portela, MD  carvedilol (COREG) 12.5 MG tablet TAKE 1 TABLET BY MOUTH TWICE DAILY WITH A MEAL Patient taking differently: Take 12.5 mg by mouth 2 (two) times daily with a meal.  05/26/19   [name], MD  cyclobenzaprine (FLEXERIL) 10 MG tablet TAKE 1 TABLET BY MOUTH THREE TIMES DAILY AS NEEDED FOR MUSCLE SPASMS Patient not taking: Reported on 04/11/2020 07/17/19   [name], MD  gabapentin (NEURONTIN) 300 MG capsule TAKE 1 CAPSULE(300 MG) BY MOUTH AT BEDTIME 05/04/20   Wyatt Portela, MD  HYDROcodone-acetaminophen (NORCO) 5-325 MG tablet Take 1 tablet by mouth every 4 (four) hours as needed for moderate pain. Patient not taking: Reported on 04/11/2020 09/28/19   Daleen Bo, MD  Multiple Vitamin (MULTIVITAMIN WITH MINERALS) TABS tablet Take 1 tablet by mouth daily.    [provider]  olaparib (LYNPARZA) 150 MG tablet Take 2 tablets (300 mg total) by mouth 2 (two) times daily. Swallow whole. May take with food to decrease nausea and vomiting. Patient not taking: Reported on 04/11/2020 03/31/20   Wyatt Portela, MD  ondansetron (ZOFRAN) 8 MG tablet Take 1 tablet (8 mg total) by mouth every 8 (eight) hours as needed for nausea or vomiting. 12/03/19   Wyatt Portela, MD  pantoprazole (PROTONIX) 40 MG tablet Take 1 tablet (40 mg total) by mouth daily. 03/12/19   Anderson, Chelsey L, DO  polyethylene glycol (MIRALAX / GLYCOLAX) 17 g packet Take 17 g by mouth daily as needed for mild constipation. Patient not taking: Reported on 04/11/2020 07/31/19   Wyatt Portela, MD  potassium chloride SA (KLOR-CON) 20 MEQ tablet Take 1 tablet (20 mEq total) by mouth daily for 3 days. Patient reports has potassium pills at home. 08/06/19  08/09/19  Florencia Reasons, MD  prochlorperazine (COMPAZINE) 10 MG tablet TAKE 1 TABLET BY MOUTH EVERY 6 HOURS AS NEEDED FOR NAUSEA OR VOMITING Patient not taking: Reported on 04/11/2020 06/11/18   Wyatt Portela, MD  senna-docusate (SENOKOT-S) 8.6-50 MG tablet Take 2 tablets by mouth 2 (two) times daily. 07/31/19   Wyatt Portela, MD  simvastatin (ZOCOR) 20 MG tablet Take 1 tablet (20 mg total) by mouth daily. 05/26/19   [name], MD    Allergies    Lisinopril, Cat hair extract, Mangifera indica, Mango flavor, Other, Peanut-containing drug products, Pistachio nut (diagnostic), and Sunflower oil  Review of Systems   Review of Systems  Constitutional: Positive for appetite change (per patient's wife) and fatigue. Negative for chills and fever.  Respiratory: Negative for cough and shortness of breath.   Cardiovascular: Negative for chest  pain.  Gastrointestinal: Positive for abdominal pain (chronic unchanged), constipation and vomiting (per patient's wife). Negative for blood in stool and nausea. Diarrhea: per patient's wife.  Genitourinary: Negative for dysuria.  Neurological: Negative for dizziness and syncope.  Psychiatric/Behavioral: Positive for confusion (per patient's wife).  All other systems reviewed and are negative.   Physical Exam Updated Vital Signs Pulse (!) 103   Temp 97.8 F (36.6 C) (Oral)   Resp 20   SpO2 100%   Physical Exam Vitals and nursing note reviewed.  Constitutional:      General: He is not in acute distress.    Appearance: He is well-developed. He is not toxic-appearing.  HENT:     Head: Normocephalic and atraumatic.  Eyes:     General:        Right eye: No discharge.        Left eye: No discharge.     Extraocular Movements: Extraocular movements intact.     Pupils: Pupils are equal, round, and reactive to light.     Comments: Pale conjunctive are noted.  Cardiovascular:     Rate and Rhythm: Normal rate and regular rhythm.  Pulmonary:     Effort:  Pulmonary effort is normal. No respiratory distress.     Breath sounds: Normal breath sounds. No wheezing, rhonchi or rales.     Comments: Right chest wall catheter present without surrounding erythema or warmth. Abdominal:     General: There is no distension.     Palpations: Abdomen is soft.     Tenderness: There is abdominal tenderness (generalized). There is no guarding or rebound.  Musculoskeletal:     Cervical back: Neck supple. No rigidity.  Skin:    General: Skin is warm and dry.     Findings: No rash.  Neurological:     Mental Status: He is alert.     Comments: Clear speech.  CN III through XII grossly intact.  Sensation and strength grossly intact.  States the year is 2001, oriented to self and place.  Psychiatric:        Behavior: Behavior normal.     ED Results / Procedures / Treatments   Labs (all labs ordered are listed, but only abnormal results are displayed) Labs Reviewed - No data to display  EKG None  Radiology CT Head Wo Contrast  Result Date: 05/23/2020 CLINICAL DATA:  Altered mental status EXAM: CT HEAD WITHOUT CONTRAST TECHNIQUE: Contiguous axial images were obtained from the base of the skull through the vertex without intravenous contrast. COMPARISON:  None. FINDINGS: Brain: Normal anatomic configuration. Mild parenchymal volume loss is commensurate with the patient's age. Extensive subcortical and periventricular white matter changes are present likely reflecting the sequela of small vessel ischemia. Remote lacunar infarct noted within the left caudate nucleus. A small amount of a subtle subarachnoid hemorrhage is seen interdigitating within the sulci of the left frontal lobe. There is no associated abnormal mass effect or midline shift. No abnormal intra or extra-axial mass lesion. Ventricular size is normal. Cerebellum unremarkable. Vascular: Unremarkable Skull: Intact Sinuses/Orbits: There is near complete opacification of the ethmoid air cells bilaterally  and frontal sinuses. Moderate mucosal thickening is seen within the visualized maxillary sinuses and right sphenoid sinus. The orbits are unremarkable. Other: Mastoid air cells and middle ear cavities are clear. Erosive changes are seen within the a right condylar process of the mandible, not fully characterized on this examination. IMPRESSION: 1. Small amount of subtle subarachnoid hemorrhage interdigitating within the sulci of the  left frontal lobe. No associated abnormal mass effect or midline shift. Contrast enhanced MRI examination may be helpful to confirm this finding assess for alternate causes of subarachnoid hemorrhage in absence of a history of trauma. 2. Pansinusitis. 3. Erosive changes within the right condylar process of the mandible, not fully characterized on this examination. 4. These results were called by telephone at the time of interpretation on 05/23/2020 at 7:50 pm to provider [facility] , who verbally acknowledged these results. Electronically Signed   By: Fidela Salisbury MD   On: 05/23/2020 19:41   CT Abdomen Pelvis W Contrast  Result Date: 05/23/2020 CLINICAL DATA:  Metastatic prostate cancer, anemia, generalized weakness, acute nonlocalized abdominal pain EXAM: CT ABDOMEN AND PELVIS WITH CONTRAST TECHNIQUE: Multidetector CT imaging of the abdomen and pelvis was performed using the standard protocol following bolus administration of intravenous contrast. CONTRAST:  150mL OMNIPAQUE IOHEXOL 300 MG/ML  SOLN COMPARISON:  08/03/2019 FINDINGS: Lower chest: Moderate right and small left pleural effusions are partially visualized with associated bibasilar compressive atelectasis. Multiple pulmonary nodules are identified within the visualized lung bases, not well characterized on this examination. Moderate right coronary artery calcification. Global cardiac size within normal limits. Hepatobiliary: No focal liver abnormality is seen. No gallstones, gallbladder wall thickening, or biliary  dilatation. Pancreas: Un remark Spleen: Unremarkable Adrenals/Urinary Tract: Adrenal glands are unremarkable. Kidneys are normal, without renal calculi, focal lesion, or hydronephrosis. Bladder is partially obscured by streak artifact from bilateral hip prostheses, but is otherwise unremarkable. Stomach/Bowel: Stomach, small bowel, and large bowel are unremarkable. Appendix normal. No free intraperitoneal gas or fluid. Vascular/Lymphatic: The abdominal vasculature is age-appropriate with mild aortoiliac atherosclerotic calcification. No aneurysm. No pathologic adenopathy within the abdomen and pelvis. Reproductive: The prostate gland is largely obscured by streak artifact. Brachytherapy seeds are noted, however. Other: Rectum unremarkable Musculoskeletal: Bilateral total hip arthroplasty has been performed. Since the prior examination, there have developed a mixed lytic and sclerotic pattern within the T12-L3 vertebral bodies which is indeterminate. Possible erosive changes involving the right transverse process of L2, best seen on axial image # 32/3. Additionally, fractures of the superior endplate of C62 and L1 and L2 have developed, likely subacute to chronic in nature though new since prior examination. Stable grade 1 anterolisthesis of L4 upon L5. IMPRESSION: 1. Moderate right and small left pleural effusions are partially visualized with associated bibasilar compressive atelectasis. 2. Multiple pulmonary nodules within the visualized lung bases, not well characterized on this examination. Findings are concerning for metastatic disease. 3. Interval development of a mixed lytic and sclerotic pattern within the T12-L3 vertebral bodies which is indeterminate. Additionally, fractures of the superior endplate of B76 and L1 and L2 have developed, likely subacute to chronic in nature though new since prior examination. This can be seen in the setting of marrow infiltrative processes, particularly given the possible  erosive change involving the L2 vertebral body. Contrast enhanced MRI examination may be helpful to further evaluate this. Aortic Atherosclerosis (ICD10-I70.0). Electronically Signed   By: Fidela Salisbury MD   On: 05/23/2020 19:31    Procedures .Critical Care Performed by: Amaryllis Dyke, PA-C Authorized by: Amaryllis Dyke, PA-C    CRITICAL CARE Performed by: Kennith Maes   Total critical care time: 50 minutes  Critical care time was exclusive of separately billable procedures and treating other patients.  Critical care was necessary to treat or prevent imminent or life-threatening deterioration.  Critical care was time spent personally by me on the following activities: development  of treatment plan with patient and/or surrogate as well as nursing, discussions with consultants, evaluation of patient's response to treatment, examination of patient, obtaining history from patient or surrogate, ordering and performing treatments and interventions, ordering and review of laboratory studies, ordering and review of radiographic studies, pulse oximetry and re-evaluation of patient's condition.    (including critical care time)  Medications Ordered in ED Medications  0.9 %  sodium chloride infusion (has no administration in time range)  iohexol (OMNIPAQUE) 300 MG/ML solution 100 mL (100 mLs Intravenous Contrast Given 05/23/20 1902)  sodium chloride 0.9 % bolus 500 mL (500 mLs Intravenous New Bag/Given 05/23/20 2108)  LORazepam (ATIVAN) injection 0.5 mg (0.5 mg Intravenous Given 05/23/20 2105)    ED Course  I have reviewed the triage vital signs and the nursing notes.  Pertinent labs & imaging results that were available during my care of the patient were reviewed by me and considered in my medical decision making (see chart for details).    MDM Rules/Calculators/A&P                          Patient presents to the ED for evaluation of need for transfusion due to  abnormal outpatient labs.  Per patient report he has some mild abdominal discomfort which is not new for him, however per his wife he has also had issues with constipation, vomiting, and poor p.o. intake.  Labs earlier today show anemia, thrombocytopenia, and leukopenia.  We will repeat labs in the emergency department to ensure accuracy, patient will likely need transfusion & discussion with oncology.  Additional history obtained:  Additional history obtained from chart review as well as discussion with patient's wife via telephone.. Previous records obtained and reviewed.   Problems with CBC in the lab, had to be resent down resulting in delay. Lab Tests:  I Ordered, reviewed, and interpreted labs, which included:  CBC: Leukopenia at 1.1, anemia with hemoglobin of 4.2, and thrombocytopenia with platelet count of 15.  Current ANC pending, was low earlier today. CMP: No significant derangements.  Imaging Studies ordered:  I ordered imaging studies which included CT head & CT A/P, I independently visualized and interpreted imaging- see further information below.   Patient with critical anemia, 3 units PRBCs have been ordered.  Will discuss with oncology regarding thrombocytopenia and need for possible platelet transfusion as well as his leukopenia/neutropenia. In terms of neutropenia he is afebrile- not performing rectal exam in setting of neutropenia, no reports of overt GI bleeding per patient or his wife.    19:25: CONSULT: Discussed with oncologist Dr. Burr Medico- if no frank blood reported no need for DRE with neutropenia, in agreement with PRBCs, states do not need PLT transfusion unless < 10 at this time. Will follow up on patient and likely see in AM. Appreciate consultation.   CT A/P with some progression of patient's cancer.   Dr. Stark Jock received call from radiology- patient with small amount of subtle subarachnoid hemorrhage- no associated abnormal mass effect or midline shift --> re-discussed  W/ Dr. Burr Medico w/ oncology, she states if small SAH can continue to hold off on platelets, if neurosurgery feels they are necessary then okay to give. Re-discussed with patient who denies falls, re-discussed with patient's wife who has not noted any falls/trauma.   20:08: CONSULT: Discussed with APP Meyran with neurosurgery who has reviewed imaging, recommends obtaining MRI of brain w/ contrast, appears very small on her review of CT, if  no change in interpretation on MRI no further intervention/evaluation needed based on size. From [facility] standpoint no need to give PLTs.   20:44: CONSULT: Discussed with hospitalist Dr. Flossie Buffy- requesting 500 cc fluids with soft Bps which has been ordered pending blood being started. Appreciate consultation.   Patient with claustrophobia with MRI, 0.5 mg ativan ordered.   Findings and plan of care discussed with supervising physician Dr. Stark Jock who has provided guidance in care & is in agreement.   Portions of this note were generated with Lobbyist. Dictation errors may occur despite best attempts at proofreading.   Final Clinical Impression(s) / ED Diagnoses Final diagnoses:  Anemia, unspecified type  Thrombocytopenia (HCC)  Leukopenia, unspecified type  Subarachnoid hemorrhage [facility])    Rx / DC Orders ED Discharge Orders    None       Leafy Kindle 05/23/20 2229    Veryl Speak, MD 05/23/20 [id number] Finklea R, PA-C 05/23/20 2306    Veryl Speak, MD 05/24/20 [phone]

## 2020-05-23 NOTE — ED Notes (Signed)
Pt transported to CT ?

## 2020-05-23 NOTE — Telephone Encounter (Signed)
Pt wife called stating pt appetite has decreased, as well as weight loss. Pt is having nausea and vomiting as well as hallucinations. She stated he's drinking about 3 bottles of water a day along with meds. Appt was scheduled for labs flush and to be seen in [facility]. Pt wife is aware of appt and time. Scheduling message was sent.

## 2020-05-23 NOTE — Telephone Encounter (Addendum)
CRITICAL VALUE STICKER  CRITICAL VALUE: HGB 4.4 ANC 0.3  RECEIVER (on-site recipient of call): Lenox Ponds LPN  DATE & TIME NOTIFIED: 05/23/20 1450  MESSENGER (representative from lab):[facility]  MD NOTIFIED: Sandi Mealy PA   TIME OF NOTIFICATION: [id number]  RESPONSE: Pt. To go to ER to receive blood

## 2020-05-23 NOTE — ED Notes (Signed)
Per CT- Pts current port access is incompatible with CT with contrast due to port access needing to be "Power Port."  EDP made aware.  Per EDP request, pts port deaccessed.  Pts port reaccessed using "Power Port" kit to be compatible for CT scan.  Pt made aware of situation.

## 2020-05-23 NOTE — Progress Notes (Signed)
Attempted to get pt for MRI. No pre-meds ordered due to claustrophobia. Due to MRI having other STAT exams waiting, had to go to the next pt to continue workflow. RN to notify MRI when meds get ordered and he will be placed back on MRI list.

## 2020-05-23 NOTE — Progress Notes (Signed)
[facility] Progress Note   Dennis Fischer [id number] January 04, 1946 73 y.o.  Dennis Fischer is managed by Dr. Zola Button  Actively treated with chemotherapy/immunotherapy/hormonal therapy: yes  Current therapy: Lonie Peak  Next scheduled appointment with provider: 06/08/2020  Assessment: Plan:    Prostate cancer metastatic to bone [facility]) - Plan: Sample to Blood Bank  Metastatic cancer to spine [facility])  Other pancytopenia ([place])   Metastatic prostate cancer with bone metastasis and metastasis to the spine: Dennis Fischer continues to be followed by Dr. Zola Button and has most recently been treated with Lonie Peak.  He is scheduled to see Dr. Alen Blew next on 06/08/2020.  Pancytopenia with a hemoglobin of 4.4 and a platelet count of 14: The patient was transported to the emergency room for evaluation and management.  Please see After Visit Summary for patient specific instructions.  Future Appointments  Date Time Provider [place]  06/08/2020  9:45 AM [facility] None  06/08/2020 10:00 AM [facility] [place] FLUSH [facility] None  06/08/2020 10:30 AM Wyatt Portela, MD [facility] None  06/08/2020 11:00 AM [facility] [place] FLUSH [facility] None    Orders Placed This Encounter  Procedures  . Sample to Blood Bank       Subjective:   Patient ID:  Dennis Fischer is a 74 y.o. (DOB [birth year]) male.  Chief Complaint: No chief complaint on file.   HPI Dennis Fischer  is a 74 y.o. male with a diagnosis of a metastatic prostate cancer with bone metastasis and metastasis to the spine. He continues to be followed by Dr. Zola Button and has most recently been treated with Lonie Peak.  He presents to the clinic today with his wife.  She reports that Dennis Fischer has been confused, weak, and "does not look himself."  She is noticed these changes since late last week.  He continues taking [place]  He is scheduled to see Dr. Alen Blew in follow-up on 06/08/2020.  His  labs from today showed a WBC of 1.4, ANC of 0.3, hemoglobin of 4.4, hematocrit of 12.9 and a platelet count of 14.  1 month ago his CBC returned with a WBC of 2.3, ANC of 1.0, hemoglobin of 8.8, hematocrit of 26.6, and platelet count of 85.  A blood sample was sent to blood bank.  Dennis Fischer reports that he is having left upper quadrant abdominal pain but denies any bright red blood per rectum or melena.  He was transported to the emergency room for evaluation and management.   The patient's oncologic history is as noted below:  Principle Diagnosis: 74 year old man with advanced prostate cancer with disease to the bone diagnosed in 2016.  He has castration-resistant currently. ATM mutation detected on guardant 360 next generation sequencing  Prior Therapy: He received definitive therapy using radiation therapy and androgen deprivation in 2016.  He developed advanced disease with pelvic adenopathy and was treated with androgen deprivation while was living in [place].  He subsequently developed castration resistant disease.  Zytiga 1000 mg daily restarted on 03/14/2017.  Therapy discontinued in August 2018 due to progression of disease.  Taxotere chemotherapy at 75 mg/m given every 3 weeks started on the first 2019.  He completed 8 cycles of therapy in January 2020.  He is status post epidural compression and decompressive laminectomy completed on March 09, 2019 between T7-T8 and T9 completed by Dr. Trenton Gammon.  He completed additional radiation therapy in the form of 10 fractions for a total of 30 Gy in July 2020.  Trudi Ida  started on December 16, 2018.  He completed 6 months of therapy on July 03, 2019.  Current therapy:   Androgen deprivation therapy every 4 months.  He is currently on Eligard every 4 months and due next injection on May 31, 2020.  Medications: I have reviewed the patient's current medications.  Allergies:  Allergies  Allergen Reactions  . Lisinopril Swelling     Facial swelling  . Cat Hair Extract     eyes swell  . Mangifera Indica     MANGO --  . Mango Flavor     throat swells with mango  . Other   . Peanut-Containing Drug Products   . Pistachio Nut (Diagnostic)     hands and feet burn/itch  . Sunflower Oil     Sunflower seed allergy    Past Medical History:  Diagnosis Date  . Acute blood loss anemia   . Hypertension   . Metastatic cancer to bone ([place]) dx'd 2018  . Prostate cancer [facility])     Past Surgical History:  Procedure Laterality Date  . HIP SURGERY Bilateral   . IR IMAGING GUIDED PORT INSERTION  04/22/2018  . LAMINECTOMY N/A 03/09/2019   Procedure: THORACIC SEVEN - THORACIC EIGHT - THORACIC NINE LAMINECTOMY WITH RESECTION OF EPIDURAL TUMOR;  Surgeon: Earnie Larsson, MD;  Location: [place];  Service: Neurosurgery;  Laterality: N/A;    Family History  Problem Relation Age of Onset  . Prostate cancer Father     Social History   Socioeconomic History  . Marital status: Significant Other    Spouse name: [name]  . Number of children: 3  . Years of education: Not on file  . Highest education level: Not on file  Occupational History  . Not on file  Tobacco Use  . Smoking status: Former Smoker    Packs/day: 0.25    Years: 50.00    Pack years: 12.50    Types: Cigarettes    Quit date: 09/10/2014    Years since quitting: 5.7  . Smokeless tobacco: Never Used  . Tobacco comment: Reports smoking only when he drank.  Vaping Use  . Vaping Use: Never used  Substance and Sexual Activity  . Alcohol use: Yes    Comment: Once every 2 weeks.   . Drug use: Yes    Types: Marijuana    Comment: Medical   . Sexual activity: Not Currently  Other Topics Concern  . Not on file  Social History Narrative  . Not on file   Social Determinants of Health   Financial Resource Strain:   . Difficulty of Paying Living Expenses: Not on file  Food Insecurity:   . Worried About Charity fundraiser in the Last Year: Not on file  . Ran Out of Food  in the Last Year: Not on file  Transportation Needs:   . Lack of Transportation (Medical): Not on file  . Lack of Transportation (Non-Medical): Not on file  Physical Activity:   . Days of Exercise per Week: Not on file  . Minutes of Exercise per Session: Not on file  Stress:   . Feeling of Stress : Not on file  Social Connections:   . Frequency of Communication with Friends and Family: Not on file  . Frequency of Social Gatherings with Friends and Family: Not on file  . Attends Religious Services: Not on file  . Active Member of Clubs or Organizations: Not on file  . Attends Archivist Meetings: Not on file  .  Marital Status: Not on file  Intimate Partner Violence:   . Fear of Current or Ex-Partner: Not on file  . Emotionally Abused: Not on file  . Physically Abused: Not on file  . Sexually Abused: Not on file    Past Medical History, Surgical history, Social history, and Family history were reviewed and updated as appropriate.   Please see review of systems for further details on the patient's review from today.   Review of Systems:  Review of Systems  Constitutional: Positive for fatigue. Negative for chills, diaphoresis and fever.  HENT: Negative for trouble swallowing and voice change.   Respiratory: Negative for cough, chest tightness, shortness of breath and wheezing.   Cardiovascular: Negative for chest pain and palpitations.  Gastrointestinal: Positive for abdominal pain. Negative for constipation, diarrhea, nausea and vomiting.  Musculoskeletal: Negative for back pain and myalgias.  Skin: Positive for pallor.  Neurological: Negative for dizziness, light-headedness and headaches.  Psychiatric/Behavioral: Positive for confusion.    Objective:   Physical Exam:  BP (!) 90/53 (BP Location: Left Arm, Patient Position: Sitting)   Pulse 83   Temp 97.8 F (36.6 C) (Tympanic)   Resp 18   Ht '5\' 11"'  (1.803 m)   Wt 150 lb 8 oz (68.3 kg)   SpO2 96%   BMI 20.99  kg/m  ECOG: 1  Physical Exam Constitutional:      General: He is not in acute distress.    Appearance: He is not diaphoretic.  HENT:     Head: Normocephalic and atraumatic.  Eyes:     Comments: Conjunctiva is pale  Cardiovascular:     Rate and Rhythm: Normal rate and regular rhythm.     Heart sounds: Normal heart sounds. No murmur heard.  No friction rub. No gallop.   Pulmonary:     Effort: Pulmonary effort is normal. No respiratory distress.     Breath sounds: Normal breath sounds. No wheezing or rales.  Abdominal:     General: Abdomen is flat. Bowel sounds are normal. There is no distension.     Palpations: Abdomen is soft.     Tenderness: There is no abdominal tenderness. There is no guarding.  Skin:    General: Skin is warm and dry.     Coloration: Skin is pale.     Findings: No erythema or rash.     Comments: Decreased capillary refill  Neurological:     Mental Status: He is alert.     Gait: Gait abnormal (Sclera is the patient is ambulating with use of a wheelchair.).     Comments: Dennis Fischer was able to tell me that it was September and that the president was Edmon Crape.  He did however state that the year was 2001.     Lab Review:     Component Value Date/Time   NA 137 05/23/2020 1358   NA 141 08/11/2019 1352   NA 141 08/08/2017 1116   K 4.1 05/23/2020 1358   K 3.6 08/08/2017 1116   CL 109 05/23/2020 1358   CO2 19 (L) 05/23/2020 1358   CO2 24 08/08/2017 1116   GLUCOSE 146 (H) 05/23/2020 1358   GLUCOSE 100 08/08/2017 1116   BUN 21 05/23/2020 1358   BUN 11 08/11/2019 1352   BUN 13.2 08/08/2017 1116   CREATININE 1.31 (H) 05/23/2020 1358   CREATININE 1.1 08/08/2017 1116   CALCIUM 8.7 (L) 05/23/2020 1358   CALCIUM 9.1 08/08/2017 1116   PROT 6.6 05/23/2020 1358   PROT  7.5 08/08/2017 1116   ALBUMIN 3.4 (L) 05/23/2020 1358   ALBUMIN 3.9 08/08/2017 1116   AST 24 05/23/2020 1358   AST 17 08/08/2017 1116   ALT 9 05/23/2020 1358   ALT 17 08/08/2017 1116    ALKPHOS 101 05/23/2020 1358   ALKPHOS 214 (H) 08/08/2017 1116   BILITOT 0.6 05/23/2020 1358   BILITOT 0.29 08/08/2017 1116   GFRNONAA 54 (L) 05/23/2020 1358   GFRAA >60 05/23/2020 1358       Component Value Date/Time   WBC 1.4 (L) 05/23/2020 1358   WBC 3.7 (L) 08/06/2019 0323   RBC 1.32 (L) 05/23/2020 1358   HGB 4.4 (LL) 05/23/2020 1358   HGB 11.8 (L) 08/11/2019 1352   HGB 12.9 (L) 08/08/2017 1117   HCT 12.9 (L) 05/23/2020 1358   HCT 35.7 (L) 08/11/2019 1352   HCT 38.7 08/08/2017 1117   PLT 14 (L) 05/23/2020 1358   PLT 301 08/11/2019 1352   MCV 97.7 05/23/2020 1358   MCV 91 08/11/2019 1352   MCV 89.4 08/08/2017 1117   MCH 33.3 05/23/2020 1358   MCHC 34.1 05/23/2020 1358   RDW 16.9 (H) 05/23/2020 1358   RDW 15.9 (H) 08/11/2019 1352   RDW 14.1 08/08/2017 1117   LYMPHSABS 1.1 05/23/2020 1358   LYMPHSABS 2.0 08/08/2017 1117   MONOABS 0.0 (L) 05/23/2020 1358   MONOABS 0.3 08/08/2017 1117   EOSABS 0.0 05/23/2020 1358   EOSABS 0.3 08/08/2017 1117   BASOSABS 0.0 05/23/2020 1358   BASOSABS 0.0 08/08/2017 1117   -------------------------------  Imaging from last 24 hours (if applicable):  Radiology interpretation: No results found.

## 2020-05-23 NOTE — ED Notes (Signed)
Per Lucianne Lei, PA-patient with metastatic prostate cancer-wife called office today and stated increased confusion, weakness-states Hgb 4.4-possibly related to chemo/radiation

## 2020-05-23 NOTE — ED Notes (Signed)
[place] blood tube recollected and submitted to lab, per lab request.

## 2020-05-23 NOTE — ED Triage Notes (Signed)
Pt from [place]-  Hx mets prostate cancer, taking oral therapy.  Wife reports new confusion progressing since late last week, pt doesnt "look like he usually does." In CC, pt answers questions appropriately. AOx4.    Pt arrives to ED with port accessed. Labs done in CC, including type and screen.

## 2020-05-23 NOTE — Progress Notes (Signed)
Eval for possible subarachnoid hemorrhage. Pt given pre meds. Pt extremely confused. Pt crawling out of the scanner. Flex coil utilized,pt refused to lay flat. Pt terminated exam. Best obtainable images. MRI without contrast was completed and turned in for radiologist dictation. Pt aborted exam due to wanting out of scanner and wanted to stop.

## 2020-05-23 NOTE — H&P (Signed)
History and Physical    Dennis Fischer MOL:[id number] DOB: 03-08-1946 DOA: 05/23/2020  PCP: Dennis Oliphant, MD  Patient coming from: Oncology office  I have personally briefly reviewed patient's old medical records in [place]  Chief Complaint: Pancytopenia  HPI: Dennis Fischer is a 74 y.o. male with medical history significant for metastatic prostate cancer to bone on androgen deprivation therapy, hypertension, hyperlipidemia, GERD and history of cocaine use who presents from oncology office for concerns of pancytopenia and profound anemia.  Patient was seen today at his oncology office where his wife reported concerns of patient being more confused and weak.  Lab work showed WBC of 1.4, ANC of 0.3, hemoglobin of 4.4, hematocrit of 12.9 and a platelet count of 14.  Prior hemoglobin of 8.8 a month ago.  I spoke with wife over the phone and she says that patient has been having hallucinations for the past year that she thought was due to him taking gabapentin at night for his chemotherapy-induced neuropathy.  They have since changed gabapentin to daytime but patient continues to have on and off hallucination.  Several days ago patient was screaming out loud and calling his grandmother in his sleep which she says is new for him.  Then over the past several days she has noticed that he has had decreased appetite and some color changes and pallor to his face.  Today he did not want to eat and did not want his wife to touch him or changes pull-ups.  He complained of lower abdominal pain today and has not had a bowel movement for a week.  She denies him having any falls or head trauma.  No signs of bleeding aside from one episode of nose bleeding several days ago.  No dark stools. She states that normally he is oriented x4.  ED Course: He was afebrile, tachycardic and was hypotensive at one point down to 99 over 50s on room air.  Repeat lab work now shows WBC of 1.1, hemoglobin of 4.2, hematocrit  of 12.4, platelet of 15.  ANC of 0.3.   CT head was obtained showing a small amount of subtle subarachnoid hemorrhage within this foci of the left frontal lobe.  ED PA Kennith Maes consulted with oncology Dr. Annamaria Boots and she does not recommend any platelet transfusion at this time unless less than 10.  neurosurgery was consulted regarding subarachnoid hemorrhage and felt it was okay to continue to hold platelet transfusion.  Recommend to obtain MRI of the brain with no further intervention needed if there is no change in MRI results.  Review of Systems:  Difficult to fully assess given patient's altered mental status  Past Medical History:  Diagnosis Date  . Acute blood loss anemia   . Hypertension   . Metastatic cancer to bone ([place]) dx'd 2018  . Prostate cancer [facility])     Past Surgical History:  Procedure Laterality Date  . HIP SURGERY Bilateral   . IR IMAGING GUIDED PORT INSERTION  04/22/2018  . LAMINECTOMY N/A 03/09/2019   Procedure: THORACIC SEVEN - THORACIC EIGHT - THORACIC NINE LAMINECTOMY WITH RESECTION OF EPIDURAL TUMOR;  Surgeon: Earnie Larsson, MD;  Location: [facility];  Service: Neurosurgery;  Laterality: N/A;     reports that he quit smoking about 5 years ago. His smoking use included cigarettes. He has a 12.50 pack-year smoking history. He has never used smokeless tobacco. He reports current alcohol use. He reports current drug use. Drug: Marijuana. Social History  Allergies  Allergen Reactions  . Lisinopril Swelling    Facial swelling  . Cat Hair Extract     eyes swell  . Mangifera Indica     MANGO --  . Mango Flavor     throat swells with mango  . Other   . Peanut-Containing Drug Products   . Pistachio Nut (Diagnostic)     hands and feet burn/itch  . Sunflower Oil     Sunflower seed allergy    Family History  Problem Relation Age of Onset  . Prostate cancer Father      Prior to Admission medications   Medication Sig Start Date End Date Taking?  Authorizing Provider  acetaminophen (TYLENOL) 325 MG tablet Take 1-2 tablets (325-650 mg total) by mouth every 4 (four) hours as needed for mild pain. 03/27/19   Love, Ivan Anchors, PA-C  amLODipine (NORVASC) 10 MG tablet Take 1 tablet (10 mg total) by mouth daily. 02/19/20   Dennis Oliphant, MD  CALCIUM 500/D 500-200 MG-UNIT tablet TAKE 1 TABLET BY MOUTH TWICE DAILY Patient taking differently: Take 1 tablet by mouth daily with breakfast.  05/21/19   Wyatt Portela, MD  carvedilol (COREG) 12.5 MG tablet TAKE 1 TABLET BY MOUTH TWICE DAILY WITH A MEAL Patient taking differently: Take 12.5 mg by mouth 2 (two) times daily with a meal.  05/26/19   Dennis Oliphant, MD  cyclobenzaprine (FLEXERIL) 10 MG tablet TAKE 1 TABLET BY MOUTH THREE TIMES DAILY AS NEEDED FOR MUSCLE SPASMS Patient not taking: Reported on 04/11/2020 07/17/19   Dennis Oliphant, MD  gabapentin (NEURONTIN) 300 MG capsule TAKE 1 CAPSULE(300 MG) BY MOUTH AT BEDTIME 05/04/20   Wyatt Portela, MD  HYDROcodone-acetaminophen (NORCO) 5-325 MG tablet Take 1 tablet by mouth every 4 (four) hours as needed for moderate pain. Patient not taking: Reported on 04/11/2020 09/28/19   Daleen Bo, MD  Multiple Vitamin (MULTIVITAMIN WITH MINERALS) TABS tablet Take 1 tablet by mouth daily.    [provider]  olaparib (LYNPARZA) 150 MG tablet Take 2 tablets (300 mg total) by mouth 2 (two) times daily. Swallow whole. May take with food to decrease nausea and vomiting. 03/31/20   Wyatt Portela, MD  ondansetron (ZOFRAN) 8 MG tablet Take 1 tablet (8 mg total) by mouth every 8 (eight) hours as needed for nausea or vomiting. 12/03/19   Wyatt Portela, MD  pantoprazole (PROTONIX) 40 MG tablet Take 1 tablet (40 mg total) by mouth daily. 03/12/19   Anderson, Chelsey L, DO  polyethylene glycol (MIRALAX / GLYCOLAX) 17 g packet Take 17 g by mouth daily as needed for mild constipation. Patient not taking: Reported on 04/11/2020 07/31/19   Wyatt Portela, MD  potassium chloride SA  (KLOR-CON) 20 MEQ tablet Take 1 tablet (20 mEq total) by mouth daily for 3 days. Patient reports has potassium pills at home. 08/06/19 08/09/19  Florencia Reasons, MD  prochlorperazine (COMPAZINE) 10 MG tablet TAKE 1 TABLET BY MOUTH EVERY 6 HOURS AS NEEDED FOR NAUSEA OR VOMITING Patient not taking: Reported on 04/11/2020 06/11/18   Wyatt Portela, MD  senna-docusate (SENOKOT-S) 8.6-50 MG tablet Take 2 tablets by mouth 2 (two) times daily. 07/31/19   Wyatt Portela, MD  simvastatin (ZOCOR) 20 MG tablet Take 1 tablet (20 mg total) by mouth daily. 05/26/19   Dennis Oliphant, MD    Physical Exam: Vitals:   05/23/20 1730 05/23/20 1830 05/23/20 1936 05/23/20 2106  BP: 108/83 104/63 (!) 99/58 100/63  Pulse: (!) 110 (!) 112 (!) 108 (!) 111  Resp: 17 17 17  (!) 22  Temp:   99 F (37.2 C)   TempSrc:   Oral   SpO2: 99% 97% 99% 97%    Constitutional: NAD, calm, comfortable, thin nontoxic appearing male laying flat in bed asleep Vitals:   05/23/20 1730 05/23/20 1830 05/23/20 1936 05/23/20 2106  BP: 108/83 104/63 (!) 99/58 100/63  Pulse: (!) 110 (!) 112 (!) 108 (!) 111  Resp: 17 17 17  (!) 22  Temp:   99 F (37.2 C)   TempSrc:   Oral   SpO2: 99% 97% 99% 97%   Eyes: PERRL, lids and conjunctivae normal ENMT: Mucous membranes are moist.  Neck: normal, supple, Respiratory: clear to auscultation bilaterally, no wheezing, no crackles. Normal respiratory effort. Cardiovascular: Regular rate and rhythm, no murmurs / rubs / gallops. No extremity edema. 2+ pedal pulses. Port-a cath in place to right chest.  Abdomen: no tenderness, no masses palpated.  Bowel sounds positive.  GU: wearing pull ups Musculoskeletal: no clubbing / cyanosis. No joint deformity upper and lower extremities. Good ROM, no contractures. Normal muscle tone.  Skin: no rashes, lesions, ulcers. No induration Neurologic: CN 2-12 grossly intact. Sensation intact,  Strength 5/5 in all 4. Able to follow commands and answer questions. Psychiatric:  alert and oriented only to self and place.     Labs on Admission: I have personally reviewed following labs and imaging studies  CBC: Recent Labs  Lab 05/23/20 1358 05/23/20 1700  WBC 1.4* 1.1*  NEUTROABS 0.3* 0.3*  HGB 4.4* 4.2*  HCT 12.9* 12.4*  MCV 97.7 100.0  PLT 14* 15*   Basic Metabolic Panel: Recent Labs  Lab 05/23/20 1358 05/23/20 1633  NA 137 138  K 4.1 4.2  CL 109 106  CO2 19* 19*  GLUCOSE 146* 122*  BUN 21 23  CREATININE 1.31* 1.16  CALCIUM 8.7* 8.8*  MG 2.1  --    GFR: Estimated Creatinine Clearance: 54.8 mL/min (by C-G formula based on SCr of 1.16 mg/dL). Liver Function Tests: Recent Labs  Lab 05/23/20 1358 05/23/20 1633  AST 24 23  ALT 9 12  ALKPHOS 101 86  BILITOT 0.6 0.8  PROT 6.6 6.7  ALBUMIN 3.4* 3.4*   No results for input(s): LIPASE, AMYLASE in the last 168 hours. No results for input(s): AMMONIA in the last 168 hours. Coagulation Profile: No results for input(s): INR, PROTIME in the last 168 hours. Cardiac Enzymes: No results for input(s): CKTOTAL, CKMB, CKMBINDEX, TROPONINI in the last 168 hours. BNP (last 3 results) No results for input(s): PROBNP in the last 8760 hours. HbA1C: No results for input(s): HGBA1C in the last 72 hours. CBG: No results for input(s): GLUCAP in the last 168 hours. Lipid Profile: No results for input(s): CHOL, HDL, LDLCALC, TRIG, CHOLHDL, LDLDIRECT in the last 72 hours. Thyroid Function Tests: No results for input(s): TSH, T4TOTAL, FREET4, T3FREE, THYROIDAB in the last 72 hours. Anemia Panel: No results for input(s): VITAMINB12, FOLATE, FERRITIN, TIBC, IRON, RETICCTPCT in the last 72 hours. Urine analysis:    Component Value Date/Time   COLORURINE YELLOW 08/03/2019 1723   APPEARANCEUR CLOUDY (A) 08/03/2019 1723   LABSPEC 1.010 08/03/2019 1723   PHURINE 7.0 08/03/2019 1723   GLUCOSEU NEGATIVE 08/03/2019 1723   HGBUR NEGATIVE 08/03/2019 1723   BILIRUBINUR NEGATIVE 08/03/2019 1723   KETONESUR 5 (A)  08/03/2019 1723   PROTEINUR 30 (A) 08/03/2019 1723   NITRITE NEGATIVE 08/03/2019 1723  LEUKOCYTESUR LARGE (A) 08/03/2019 1723    Radiological Exams on Admission: CT Head Wo Contrast  Result Date: 05/23/2020 CLINICAL DATA:  Altered mental status EXAM: CT HEAD WITHOUT CONTRAST TECHNIQUE: Contiguous axial images were obtained from the base of the skull through the vertex without intravenous contrast. COMPARISON:  None. FINDINGS: Brain: Normal anatomic configuration. Mild parenchymal volume loss is commensurate with the patient's age. Extensive subcortical and periventricular white matter changes are present likely reflecting the sequela of small vessel ischemia. Remote lacunar infarct noted within the left caudate nucleus. A small amount of a subtle subarachnoid hemorrhage is seen interdigitating within the sulci of the left frontal lobe. There is no associated abnormal mass effect or midline shift. No abnormal intra or extra-axial mass lesion. Ventricular size is normal. Cerebellum unremarkable. Vascular: Unremarkable Skull: Intact Sinuses/Orbits: There is near complete opacification of the ethmoid air cells bilaterally and frontal sinuses. Moderate mucosal thickening is seen within the visualized maxillary sinuses and right sphenoid sinus. The orbits are unremarkable. Other: Mastoid air cells and middle ear cavities are clear. Erosive changes are seen within the a right condylar process of the mandible, not fully characterized on this examination. IMPRESSION: 1. Small amount of subtle subarachnoid hemorrhage interdigitating within the sulci of the left frontal lobe. No associated abnormal mass effect or midline shift. Contrast enhanced MRI examination may be helpful to confirm this finding assess for alternate causes of subarachnoid hemorrhage in absence of a history of trauma. 2. Pansinusitis. 3. Erosive changes within the right condylar process of the mandible, not fully characterized on this examination.  4. These results were called by telephone at the time of interpretation on 05/23/2020 at 7:50 pm to provider [facility] , who verbally acknowledged these results. Electronically Signed   By: Fidela Salisbury MD   On: 05/23/2020 19:41   CT Abdomen Pelvis W Contrast  Result Date: 05/23/2020 CLINICAL DATA:  Metastatic prostate cancer, anemia, generalized weakness, acute nonlocalized abdominal pain EXAM: CT ABDOMEN AND PELVIS WITH CONTRAST TECHNIQUE: Multidetector CT imaging of the abdomen and pelvis was performed using the standard protocol following bolus administration of intravenous contrast. CONTRAST:  185mL OMNIPAQUE IOHEXOL 300 MG/ML  SOLN COMPARISON:  08/03/2019 FINDINGS: Lower chest: Moderate right and small left pleural effusions are partially visualized with associated bibasilar compressive atelectasis. Multiple pulmonary nodules are identified within the visualized lung bases, not well characterized on this examination. Moderate right coronary artery calcification. Global cardiac size within normal limits. Hepatobiliary: No focal liver abnormality is seen. No gallstones, gallbladder wall thickening, or biliary dilatation. Pancreas: Un remark Spleen: Unremarkable Adrenals/Urinary Tract: Adrenal glands are unremarkable. Kidneys are normal, without renal calculi, focal lesion, or hydronephrosis. Bladder is partially obscured by streak artifact from bilateral hip prostheses, but is otherwise unremarkable. Stomach/Bowel: Stomach, small bowel, and large bowel are unremarkable. Appendix normal. No free intraperitoneal gas or fluid. Vascular/Lymphatic: The abdominal vasculature is age-appropriate with mild aortoiliac atherosclerotic calcification. No aneurysm. No pathologic adenopathy within the abdomen and pelvis. Reproductive: The prostate gland is largely obscured by streak artifact. Brachytherapy seeds are noted, however. Other: Rectum unremarkable Musculoskeletal: Bilateral total hip arthroplasty has  been performed. Since the prior examination, there have developed a mixed lytic and sclerotic pattern within the T12-L3 vertebral bodies which is indeterminate. Possible erosive changes involving the right transverse process of L2, best seen on axial image # 32/3. Additionally, fractures of the superior endplate of J81 and L1 and L2 have developed, likely subacute to chronic in nature though new since prior examination.  Stable grade 1 anterolisthesis of L4 upon L5. IMPRESSION: 1. Moderate right and small left pleural effusions are partially visualized with associated bibasilar compressive atelectasis. 2. Multiple pulmonary nodules within the visualized lung bases, not well characterized on this examination. Findings are concerning for metastatic disease. 3. Interval development of a mixed lytic and sclerotic pattern within the T12-L3 vertebral bodies which is indeterminate. Additionally, fractures of the superior endplate of Y78 and L1 and L2 have developed, likely subacute to chronic in nature though new since prior examination. This can be seen in the setting of marrow infiltrative processes, particularly given the possible erosive change involving the L2 vertebral body. Contrast enhanced MRI examination may be helpful to further evaluate this. Aortic Atherosclerosis (ICD10-I70.0). Electronically Signed   By: Fidela Salisbury MD   On: 05/23/2020 19:31      Assessment/Plan Pancytopenia with metastatic prostate cancer to bone on androgen deprivation therapy WBC of 1.1, hemoglobin of 4.2, hematocrit of 12.4, platelet of 15.  ANC of 0.3 but afebile Transfuse 3u pRBC - follow CBC after transfusion Oncology aware and recommends no PLT transfuse unless less than 10  No findings on CT abdomen and pelvic to suggest any source of potential bleeding.  Wife denies any dark stools or bleeding.  Small subarachnoid hemorrhage ED PA discussed with neurosurgery who recommended MRI of the brain No platelet transfusion  at this time Frequent neurochecks  Hypotension due to hypovolemia Blood transfusion as above.  Hold all antihypertensives.  Altered mental status secondary to anemia/hypovolemia Continue to monitor mental status with transfusion  Hyperlipidemia Continue statin  Chemotherapy-induced peripheral neuropathy Continue gabapentin  DVT prophylaxis:.SCDs Code Status: DNR- confirmed with wife Family Communication: Plan discussed with patient at bedside and wife over the phone disposition Plan: Home with at least 2 midnight stays  Consults called:  Admission status: inpatient     Yenni Carra T Aztlan Coll DO [facility]   If 7PM-7AM, please contact night-coverage [email or web address]   05/23/2020, 9:40 PM

## 2020-05-24 ENCOUNTER — Telehealth: Payer: Self-pay | Admitting: Family Medicine

## 2020-05-24 DIAGNOSIS — Z515 Encounter for palliative care: Secondary | ICD-10-CM

## 2020-05-24 DIAGNOSIS — I609 Nontraumatic subarachnoid hemorrhage, unspecified: Secondary | ICD-10-CM

## 2020-05-24 DIAGNOSIS — D649 Anemia, unspecified: Secondary | ICD-10-CM

## 2020-05-24 LAB — PROSTATE-SPECIFIC AG, SERUM (LABCORP): Prostate Specific Ag, Serum: 1759 ng/mL — ABNORMAL HIGH (ref 0.0–4.0)

## 2020-05-24 MED ORDER — QUETIAPINE FUMARATE 25 MG PO TABS: 25.0000 mg | ORAL_TABLET | Freq: Every day | ORAL | 0 refills | Status: AC

## 2020-05-24 MED ORDER — LORAZEPAM 0.5 MG PO TABS: 0.5000 mg | ORAL_TABLET | Freq: Two times a day (BID) | ORAL | 0 refills | Status: AC | PRN

## 2020-05-24 MED ORDER — LORAZEPAM 2 MG/ML IJ SOLN
1.0000 mg | Freq: Once | INTRAMUSCULAR | Status: AC
  Administered 2020-05-24: 1 mg via INTRAVENOUS
  Filled 2020-05-24: qty 1

## 2020-05-24 MED ORDER — MORPHINE SULFATE (CONCENTRATE) 20 MG/ML PO SOLN: 5.0000 mg | ORAL | 0 refills | Status: AC | PRN

## 2020-05-24 MED ORDER — ONDANSETRON HCL 4 MG/2ML IJ SOLN
4.0000 mg | Freq: Four times a day (QID) | INTRAMUSCULAR | Status: DC | PRN
  Administered 2020-05-24: 4 mg via INTRAVENOUS
  Filled 2020-05-24: qty 2

## 2020-05-24 MED ORDER — POLYETHYLENE GLYCOL 3350 17 G PO PACK: 17.0000 g | PACK | Freq: Every day | ORAL | 0 refills | Status: AC

## 2020-05-24 NOTE — TOC Initial Note (Signed)
Transition of Care [facility]) - Initial/Assessment Note    Patient Details  Name: Dennis Fischer MRN: [id number] Date of Birth: 06-Oct-1945  Transition of Care [facility]) CM/SW Contact:    Erenest Rasher, RN Phone Number:  [phone] 05/24/2020, 1:16 PM  Clinical Narrative:                  TOC CM spoke to pt and gave permission to speak to wife, [name]. Contacted [name] and offered choice for Hospice. Pt/wife are agreeable to [facility] for Home Hospice. Contacted Melissa and she will speak with wife. Wife is requested pt be dc back home with [facility].    Expected Discharge Plan: Home w Hospice Care Barriers to Discharge: No Barriers Identified   Patient Goals and CMS Choice Patient states their goals for this hospitalization and ongoing recovery are:: wants to go home with Hospice CMS Medicare.gov Compare Post Acute Care list provided to:: Patient Represenative (must comment) [name] Gavin-significant other) Choice offered to / list presented to : [facility] POA / Guardian, Patient  Expected Discharge Plan and Services Expected Discharge Plan: Home w Hospice Care In-house Referral: Clinical Social Work Discharge Planning Services: CM Consult Post Acute Care Choice: Hospice Living arrangements for the past 2 months: [place]: RN [facility] Agency: Hospice and [place] Date [place]: 05/24/20 Time [place]: 1313 Representative spoke with at [place]: Amanda Cockayne  Prior Living Arrangements/Services Living arrangements for the past 2 months: [place] with:: Significant Other Patient language and need for interpreter reviewed:: Yes Do you feel safe going back to the place where you live?: Yes      Need for Family Participation in Patient Care: Yes (Comment) Care giver support system in place?: Yes (comment) Current home services: DME, Homehealth aide (wheelchair, rolling walker,  shower chair, bedside commode, hospital, [facility] 9-12 pm) Criminal Activity/Legal Involvement Pertinent to Current Situation/Hospitalization: No - Comment as needed  Activities of Daily Living Home Assistive Devices/Equipment: Wheelchair ADL Screening (condition at time of admission) Patient's cognitive ability adequate to safely complete daily activities?: No Is the patient deaf or have difficulty hearing?: Yes Does the patient have difficulty seeing, even when wearing glasses/contacts?: No Does the patient have difficulty concentrating, remembering, or making decisions?: Yes Patient able to express need for assistance with ADLs?: Yes Does the patient have difficulty dressing or bathing?: Yes Independently performs ADLs?: No Communication: Independent Dressing (OT): Needs assistance Is this a change from baseline?: Change from baseline, expected to last >3 days Grooming: Independent Feeding: Independent Bathing: Needs assistance Is this a change from baseline?: Change from baseline, expected to last >3 days Toileting: Needs assistance Is this a change from baseline?: Change from baseline, expected to last >3days In/Out Bed: Needs assistance Is this a change from baseline?: Change from baseline, expected to last >3 days Walks in Home: Needs assistance Is this a change from baseline?: Change from baseline, expected to last >3 days Does the patient have difficulty walking or climbing stairs?: Yes Weakness of Legs: Both Weakness of Arms/Hands: Both  Permission Sought/Granted Permission sought to share information with : Case Manager, Customer service manager, PCP, Family Supports Permission granted to share information with : Yes, Verbal Permission Granted  Share Information with NAME: Thurmond Butts  Permission granted to share info w  AGENCY: Hospice  Permission granted to share info w Relationship: wife  Permission granted to share info w Contact Information:  [phone]  Emotional Assessment   Attitude/Demeanor/Rapport: Gracious Affect (typically observed): Accepting Orientation: : Oriented to Self, Oriented to Place, Oriented to  Time, Oriented to Situation   Psych Involvement: No (comment)  Admission diagnosis:  Acute anemia [D64.9] SAH (subarachnoid hemorrhage) ([place]) [I60.9] Patient Active Problem List   Diagnosis Date Noted  . SAH (subarachnoid hemorrhage) ([place]) 05/24/2020  . Acute anemia 05/23/2020  . Pancytopenia ([place]) 05/23/2020  . Subarachnoid bleed ([place]) 05/23/2020  . Hypotension 05/23/2020  . AMS (altered mental status) 05/23/2020  . HLD (hyperlipidemia) 05/23/2020  . Peripheral neuropathy 05/23/2020  . Sore on ankle 04/04/2020  . Hypokalemia 08/11/2019  . Anemia in neoplastic disease 08/11/2019  . Dark urine 08/11/2019  . Sepsis ([place]) 08/03/2019  . Noninfectious colitis 08/03/2019  . Constipation 05/15/2019  . Elevated BUN   . Transaminitis   . Hypoalbuminemia due to protein-calorie malnutrition ([place])   . Steroid-induced hyperglycemia   . Essential hypertension   . Metastatic cancer to spine ([place]) 03/11/2019  . Neuropathic pain   . Benign essential HTN   . Cocaine use 03/10/2019  . Cord compression myelopathy ([place])   . Spinal cord compression ([place]) 03/09/2019  . Prostate cancer metastatic to bone ([place]) 12/22/2018  . Port-A-Cath in place 07/02/2018  . Goals of care, counseling/discussion 01/20/2018  . Right knee pain 01/17/2018  . Prostate cancer ([place]) 01/22/2017  . Prediabetes 03/30/2016  . Positive FIT (fecal immunochemical test) 11/22/2015  . GERD (gastroesophageal reflux disease) 12/21/2014  . Age-related nuclear cataract of both eyes 01/13/2013  . Hypertensive retinopathy 01/13/2013  . Essential (primary) hypertension 11/25/2012  . Pure hypercholesterolemia 11/25/2012  . History of total hip replacement 08/28/2010  . Tobacco dependence syndrome 03/22/2006   PCP:  [name], MD Pharmacy:    [place] [phone] [address] [facility] [place] [address] Phone: [phone] Fax: [place], [place] [address] [place] [address] Phone: [phone] Fax: [phone]     Social Determinants of Health (SDOH) Interventions    Readmission Risk Interventions Readmission Risk Prevention Plan 08/05/2019  Transportation Screening Complete  Medication Review Press photographer) Complete  PCP or Specialist appointment within 3-5 days of discharge Complete  HRI or [place] Complete  SW Recovery Care/Counseling Consult Not Complete  SW Consult Not Complete Comments N/A  [place] Not Applicable  Some recent data might be hidden

## 2020-05-24 NOTE — ED Notes (Signed)
Blood transfusion placed on hold from 0035-0058 due to vomiting episode. Resumed blood transfusion at 0100

## 2020-05-24 NOTE — ED Notes (Signed)
[facility] for transport home called.

## 2020-05-24 NOTE — Progress Notes (Addendum)
IP PROGRESS NOTE  Subjective:   Patient known to me with history of advanced prostate cancer currently on Lynparza.  Hospitalized with severe cytopenias and altered mental status.  CT scan of the abdomen and pelvis as well as CT scan of the head were reviewed.  Subarachnoid hemorrhage was noted with imaging studies of the abdomen and pelvis did not show any acute abnormalities.  Clinically, he remains confused and minimally responsive.  He does open his eyes to verbal stimuli.  No active bleeding noted on exam.  Objective:  Vital signs in last 24 hours: Temp:  [97.8 F (36.6 C)-99.4 F (37.4 C)] 98.5 F (36.9 C) (09/14 0619) Pulse Rate:  [83-115] 102 (09/14 0745) Resp:  [15-30] 19 (09/14 0745) BP: (90-122)/(53-104) 104/64 (09/14 0745) SpO2:  [96 %-100 %] 97 % (09/14 0745) Weight:  [150 lb 8 oz (68.3 kg)] 150 lb 8 oz (68.3 kg) (09/13 1429) Weight change:     Intake/Output from previous day: 09/13 0701 - 09/14 0700 In: 1069.2 [Blood:569.2; IV Piggyback:500] Out: -  General: Lethargic but opens his eyes on stimuli.  Overall confused. Head: Normocephalic atraumatic. Mouth: mucous membranes moist, pharynx normal without lesions Eyes: No scleral icterus.  Pupils are equal and round reactive to light. Resp: clear to auscultation bilaterally without rhonchi or wheezes or dullness to percussion. Cardio: regular rate and rhythm, S1, S2 normal, no murmur, click, rub or gallop GI: soft, non-tender; bowel sounds normal; no masses,  no organomegaly Musculoskeletal: No joint deformity or effusion. Neurological: No motor, sensory deficits.  Intact deep tendon reflexes. Skin: No rashes or lesions.  Portacath accessed without difficulties.  Lab Results: Recent Labs    05/23/20 1358 05/23/20 1700  WBC 1.4* 1.1*  HGB 4.4* 4.2*  HCT 12.9* 12.4*  PLT 14* 15*    BMET Recent Labs    05/23/20 1358 05/23/20 1633  NA 137 138  K 4.1 4.2  CL 109 106  CO2 19* 19*  GLUCOSE 146* 122*  BUN  21 23  CREATININE 1.31* 1.16  CALCIUM 8.7* 8.8*    Studies/Results: CT Head Wo Contrast  Result Date: 05/23/2020 CLINICAL DATA:  Altered mental status EXAM: CT HEAD WITHOUT CONTRAST TECHNIQUE: Contiguous axial images were obtained from the base of the skull through the vertex without intravenous contrast. COMPARISON:  None. FINDINGS: Brain: Normal anatomic configuration. Mild parenchymal volume loss is commensurate with the patient's age. Extensive subcortical and periventricular white matter changes are present likely reflecting the sequela of small vessel ischemia. Remote lacunar infarct noted within the left caudate nucleus. A small amount of a subtle subarachnoid hemorrhage is seen interdigitating within the sulci of the left frontal lobe. There is no associated abnormal mass effect or midline shift. No abnormal intra or extra-axial mass lesion. Ventricular size is normal. Cerebellum unremarkable. Vascular: Unremarkable Skull: Intact Sinuses/Orbits: There is near complete opacification of the ethmoid air cells bilaterally and frontal sinuses. Moderate mucosal thickening is seen within the visualized maxillary sinuses and right sphenoid sinus. The orbits are unremarkable. Other: Mastoid air cells and middle ear cavities are clear. Erosive changes are seen within the a right condylar process of the mandible, not fully characterized on this examination. IMPRESSION: 1. Small amount of subtle subarachnoid hemorrhage interdigitating within the sulci of the left frontal lobe. No associated abnormal mass effect or midline shift. Contrast enhanced MRI examination may be helpful to confirm this finding assess for alternate causes of subarachnoid hemorrhage in absence of a history of trauma. 2. Pansinusitis. 3. Erosive  changes within the right condylar process of the mandible, not fully characterized on this examination. 4. These results were called by telephone at the time of interpretation on 05/23/2020 at 7:50 pm  to provider [facility] , who verbally acknowledged these results. Electronically Signed   By: Fidela Salisbury MD   On: 05/23/2020 19:41   MR BRAIN WO CONTRAST  Result Date: 05/23/2020 CLINICAL DATA:  74 year old male with altered mental status. Noncontrast head CT suspicious for trace subarachnoid hemorrhage over the left anterior frontal convexity. Increased confusion, weakness. Metastatic prostate cancer. Pancytopenia. EXAM: MRI HEAD WITHOUT CONTRAST TECHNIQUE: Multiplanar, multiecho pulse sequences of the brain and surrounding structures were obtained without intravenous contrast. COMPARISON:  None. FINDINGS: The examination had to be discontinued prior to completion, and is significantly motion degraded due to patient confusion. Axial FLAIR and coronal DWI imaging is largely nondiagnostic. Axial T1 and coronal T2 weighted imaging could not be obtained. Brain: No definite restricted diffusion to suggest acute infarction. Axial T2* imaging demonstrates evidence of left superior frontal convexity subarachnoid hemorrhage as suspected by CT (series 15, images 38 and 41), as well as scattered microhemorrhage elsewhere in both cerebral hemispheres. No intraventricular blood is evident. No ventriculomegaly. No intracranial mass effect or midline shift. Patchy and widespread bilateral cerebral white matter T2 (and FLAIR) hyperintensity with areas most resembling chronic lacunar infarcts in the bilateral corona radiata and bilateral basal ganglia. Mild thalamic involvement on the right. No midline shift, mass effect, evidence of mass lesion, ventriculomegaly,. Cervicomedullary junction and pituitary are grossly normal. Vascular: Major intracranial vascular flow voids are preserved, with a degree of generalized intracranial artery tortuosity. Skull and upper cervical spine: Diffusely abnormal marrow signal in the visible upper cervical spine, and asymmetric decreased calvarium marrow signal on the right.  Abnormal marrow signal throughout the visible mandible. Sinuses/Orbits: Bilateral paranasal sinus fluid and/or mucosal thickening, relatively sparing the hyperplastic left sphenoid sinus. No acute orbit finding. Other: Mastoids are well pneumatized. Grossly normal visible internal auditory structures. IMPRESSION: 1. Truncated and intermittently motion degraded exam. No definite acute infarct. 2. Evidence of small volume of convexity subarachnoid hemorrhage as on the earlier CT. Although nonspecific, a similar pattern can be seen with minor trauma (such as fall) in elderly or coagulopathic patients. 3. Evidence of underlying chronic small vessel disease, including some parenchymal microhemorrhages. 4. Osseous metastatic disease, most apparent in the mandible and upper cervical spine. 5. Evidence of bilateral paranasal sinusitis. Electronically Signed   By: Genevie Ann M.D.   On: 05/23/2020 22:12   CT Abdomen Pelvis W Contrast  Result Date: 05/23/2020 CLINICAL DATA:  Metastatic prostate cancer, anemia, generalized weakness, acute nonlocalized abdominal pain EXAM: CT ABDOMEN AND PELVIS WITH CONTRAST TECHNIQUE: Multidetector CT imaging of the abdomen and pelvis was performed using the standard protocol following bolus administration of intravenous contrast. CONTRAST:  169mL OMNIPAQUE IOHEXOL 300 MG/ML  SOLN COMPARISON:  08/03/2019 FINDINGS: Lower chest: Moderate right and small left pleural effusions are partially visualized with associated bibasilar compressive atelectasis. Multiple pulmonary nodules are identified within the visualized lung bases, not well characterized on this examination. Moderate right coronary artery calcification. Global cardiac size within normal limits. Hepatobiliary: No focal liver abnormality is seen. No gallstones, gallbladder wall thickening, or biliary dilatation. Pancreas: Un remark Spleen: Unremarkable Adrenals/Urinary Tract: Adrenal glands are unremarkable. Kidneys are normal, without  renal calculi, focal lesion, or hydronephrosis. Bladder is partially obscured by streak artifact from bilateral hip prostheses, but is otherwise unremarkable. Stomach/Bowel: Stomach, small bowel, and large  bowel are unremarkable. Appendix normal. No free intraperitoneal gas or fluid. Vascular/Lymphatic: The abdominal vasculature is age-appropriate with mild aortoiliac atherosclerotic calcification. No aneurysm. No pathologic adenopathy within the abdomen and pelvis. Reproductive: The prostate gland is largely obscured by streak artifact. Brachytherapy seeds are noted, however. Other: Rectum unremarkable Musculoskeletal: Bilateral total hip arthroplasty has been performed. Since the prior examination, there have developed a mixed lytic and sclerotic pattern within the T12-L3 vertebral bodies which is indeterminate. Possible erosive changes involving the right transverse process of L2, best seen on axial image # 32/3. Additionally, fractures of the superior endplate of K80 and L1 and L2 have developed, likely subacute to chronic in nature though new since prior examination. Stable grade 1 anterolisthesis of L4 upon L5. IMPRESSION: 1. Moderate right and small left pleural effusions are partially visualized with associated bibasilar compressive atelectasis. 2. Multiple pulmonary nodules within the visualized lung bases, not well characterized on this examination. Findings are concerning for metastatic disease. 3. Interval development of a mixed lytic and sclerotic pattern within the T12-L3 vertebral bodies which is indeterminate. Additionally, fractures of the superior endplate of K34 and L1 and L2 have developed, likely subacute to chronic in nature though new since prior examination. This can be seen in the setting of marrow infiltrative processes, particularly given the possible erosive change involving the L2 vertebral body. Contrast enhanced MRI examination may be helpful to further evaluate this. Aortic  Atherosclerosis (ICD10-I70.0). Electronically Signed   By: Fidela Salisbury MD   On: 05/23/2020 19:31    Medications: I have reviewed the patient's current medications.  Assessment/Plan:  74 year old with:  1.  Advanced prostate cancer that has progressed on multiple therapies and currently on Lynparza.  Imaging studies did not show any clear-cut progression of disease but his PSA on 05/23/2020 continues to show increase in his PSA which indicates overall worsening progression.  His overall prognosis from prostate cancer is very poor given his overall presentation with cytopenia and subarachnoid hemorrhage.  I agree with a DNR status and would recommend transitioning into hospice care.  We will discontinue [facility] given the continued rise in his PSA.   2.  Pancytopenia: Likely related to prostate cancer infiltration in the bone marrow which is a bad prognostic sign.  This could be also related to [facility] which we will discontinue at this time.  I agree with supportive transfusion of packed red cells.  No need for platelet transfusion unless active bleeding is noted.   3.  Prognosis and goals of care: Prognosis overall poor with limited life expectancy.  Recommend hospice enrollment  25  minutes were dedicated to this visit.  50% of the time was spent face-to-face and dedicated to reviewing laboratory data, imaging studies, discussing treatment options, and addressing future plan of care.  LOS: 1 day   Zola Button 05/24/2020, 7:57 AM    This was discussed in detail with his wife Thurmond Butts.  She is aware of the gravity of his prognosis and the likelihood of not surviving much longer and the idea of hospice.   Zola Button MD

## 2020-05-24 NOTE — ED Notes (Signed)
Pt deaccessed his port. Pt presents as confused, when given verbal stimulus pt became A&Ox3, oriented to self, date, and place. Pt became more aware as I spoke with him. I re-accessed his port and will begin a new blood transfusion

## 2020-05-24 NOTE — ED Notes (Signed)
Pt has blood transfusing. Pt's morning labs need to be postponed until 1hr after his last blood transfusion is complete. MD made aware.

## 2020-05-24 NOTE — ED Notes (Signed)
As listed in note from Hospice, [facility] notified at phone number given that pt was leaving with [facility] to go home.

## 2020-05-24 NOTE — ED Notes (Addendum)
Patient became nauseated and vomited. Hospitalist Dr.Tu notified. Order given for Zofran IV. Dr.Tu stated we can continue with blood transfusion. Continue to monitor patient, VS, temp.

## 2020-05-24 NOTE — Progress Notes (Signed)
Hydrologist [facility]) Hospital Liaison: RN note    Notified by Transition of Care Manger of patient/family request for [facility] services at home after discharge. Chart and patient information under review by [facility] physician. Hospice eligibility pending currently.    Writer spoke with [name] who is his partner to initiate education related to hospice philosophy, services and team approach to care.  [name] verbalized understanding of information given. Per discussion, plan is for discharge to home by [facility].    Please send signed and completed DNR form home with patient/family. Patient will need prescriptions for discharge comfort medications.     DME needs have been discussed, patient currently has the following equipment in the home: Hospital bed, walker, BSC, WC and shower chair. No further DME is needed at this time. Home address has been verified and is correct in the chart.   [name] is the family member to contact to arrange time of delivery.     [facility] aware of the above. Please notify [facility] when patient is ready to leave the unit at discharge. (Call [phone] or [phone] after 5pm.) [facility] information and contact numbers given to [facility].      Please call with any hospice related questions.     Thank you for this referral.     Clementeen Hoof, BSN, [facility] (listed on [facility] under Hospice and [facility] [place])  [phone]

## 2020-05-24 NOTE — ED Notes (Signed)
[facility] bedside

## 2020-05-24 NOTE — Telephone Encounter (Signed)
Dr. Owens Shark,   Can I sign for this or does an attending have to? Or, do we not manage hospice care?   Dennis Fischer

## 2020-05-24 NOTE — Telephone Encounter (Signed)
Krsytal from [facility] is calling and would like to have a verbal order that Dr. [name] or an attending will agree to be signing attending for this patients hospice care  Patient is leaving the hospital today 05/24/20 to go home with hospice care.   The best call back is [phone]. It is secure to leave vm if needed.

## 2020-05-25 LAB — BPAM RBC
Blood Product Expiration Date: 202110062359
Blood Product Expiration Date: 202110062359
Blood Product Expiration Date: 202110062359
ISSUE DATE / TIME: 202109132333
ISSUE DATE / TIME: 202109140329
ISSUE DATE / TIME: 202109140604
Unit Type and Rh: 5100
Unit Type and Rh: 5100
Unit Type and Rh: 5100

## 2020-05-25 LAB — TYPE AND SCREEN
ABO/RH(D): O POS
Antibody Screen: NEGATIVE
Unit division: 0
Unit division: 0
Unit division: 0

## 2020-05-25 NOTE — Telephone Encounter (Signed)
I agree with Dr Owens Shark.  You may be the physician who directs the patient's hospice care.  Unfortunately, if the patient has Medicare, then hospice orders will need to be co-signed by an attending.

## 2020-05-26 ENCOUNTER — Telehealth: Payer: Self-pay

## 2020-05-26 NOTE — Telephone Encounter (Signed)
Adam, hospice RN, calls nurse line to give FYI that patient has signed DNR order at home.   FYI to PCP  Talbot Grumbling, RN

## 2020-05-30 NOTE — Telephone Encounter (Signed)
Called to confirm hospice attending. Patient has been admitted at home by Dr. Andria Frames.

## 2020-06-08 ENCOUNTER — Ambulatory Visit: Payer: PRIVATE HEALTH INSURANCE

## 2020-06-08 ENCOUNTER — Inpatient Hospital Stay: Payer: Medicare Other

## 2020-06-08 ENCOUNTER — Inpatient Hospital Stay: Payer: Medicare Other | Admitting: Oncology

## 2020-06-08 ENCOUNTER — Telehealth: Payer: Self-pay

## 2020-06-08 NOTE — Telephone Encounter (Signed)
Received a call from patient's hospice nurse yesterday stating patient enrolled with hospice services about 2 weeks ago with a referral from his PCP but is insisting on keeping his appointment with Dr. Alen Blew on 06/08/20. Called patient and patient stated he wants to come to the [facility] to receive his COVID booster and talk to Dr. Alen Blew about hospice.  Received a call from patient and patient's family member [name] this morning and they stated they are calling to cancel today's appointments because patient is vomiting and not feeling well. They rescheduled COVID booster appointment to Friday 10/1 at 11 am and stated they are not going to reschedule appointment with Dr. Alen Blew. Stated patient has decided to continue with hospice services. Called patient's hospice nurse Mendel Ryder and made her aware that patient is scheduled for a COVID booster on Friday but is not going to r/s with Dr. Alen Blew. Mendel Ryder stated she will go out to see patient today or tomorrow.

## 2020-06-10 ENCOUNTER — Inpatient Hospital Stay: Payer: Medicare Other | Attending: Oncology

## 2020-06-14 ENCOUNTER — Telehealth: Payer: Self-pay

## 2020-06-14 NOTE — Telephone Encounter (Signed)
Ria Comment, Hospice RN, calls nurse line reporting increasingly low blood pressures in patient. Ria Comment reports since 9/21 the patients blood pressure has dipping to ~80/50. Ria Comment reports increased fatigued and daytime sleepiness. Ria Comment states he is on BP meds, however she would like to know if PCP would like to hold these for a bit or set up parameters for BP medication. You can reach [place] at [phone].

## 2020-06-15 NOTE — Telephone Encounter (Signed)
Called Ria Comment and gave verbal order to DC both amlodipine and carvedilol

## 2020-06-18 ENCOUNTER — Inpatient Hospital Stay (HOSPITAL_COMMUNITY)
Admission: EM | Admit: 2020-06-18 | Discharge: 2020-07-11 | DRG: 189 | Disposition: E | Attending: Family Medicine | Admitting: Family Medicine

## 2020-06-18 ENCOUNTER — Emergency Department (HOSPITAL_COMMUNITY)

## 2020-06-18 ENCOUNTER — Encounter (HOSPITAL_COMMUNITY): Payer: Self-pay

## 2020-06-18 DIAGNOSIS — K219 Gastro-esophageal reflux disease without esophagitis: Secondary | ICD-10-CM | POA: Diagnosis present

## 2020-06-18 DIAGNOSIS — D61818 Other pancytopenia: Secondary | ICD-10-CM | POA: Diagnosis present

## 2020-06-18 DIAGNOSIS — Z515 Encounter for palliative care: Secondary | ICD-10-CM

## 2020-06-18 DIAGNOSIS — R0902 Hypoxemia: Secondary | ICD-10-CM

## 2020-06-18 DIAGNOSIS — E785 Hyperlipidemia, unspecified: Secondary | ICD-10-CM | POA: Diagnosis present

## 2020-06-18 DIAGNOSIS — Z79899 Other long term (current) drug therapy: Secondary | ICD-10-CM

## 2020-06-18 DIAGNOSIS — Z91010 Allergy to peanuts: Secondary | ICD-10-CM

## 2020-06-18 DIAGNOSIS — C7951 Secondary malignant neoplasm of bone: Secondary | ICD-10-CM | POA: Diagnosis present

## 2020-06-18 DIAGNOSIS — Z8042 Family history of malignant neoplasm of prostate: Secondary | ICD-10-CM

## 2020-06-18 DIAGNOSIS — R0602 Shortness of breath: Secondary | ICD-10-CM

## 2020-06-18 DIAGNOSIS — Z20822 Contact with and (suspected) exposure to covid-19: Secondary | ICD-10-CM | POA: Diagnosis present

## 2020-06-18 DIAGNOSIS — J918 Pleural effusion in other conditions classified elsewhere: Secondary | ICD-10-CM | POA: Diagnosis present

## 2020-06-18 DIAGNOSIS — I10 Essential (primary) hypertension: Secondary | ICD-10-CM | POA: Diagnosis present

## 2020-06-18 DIAGNOSIS — C61 Malignant neoplasm of prostate: Secondary | ICD-10-CM | POA: Diagnosis present

## 2020-06-18 DIAGNOSIS — E7800 Pure hypercholesterolemia, unspecified: Secondary | ICD-10-CM | POA: Diagnosis present

## 2020-06-18 DIAGNOSIS — R7303 Prediabetes: Secondary | ICD-10-CM | POA: Diagnosis present

## 2020-06-18 DIAGNOSIS — Z96643 Presence of artificial hip joint, bilateral: Secondary | ICD-10-CM | POA: Diagnosis present

## 2020-06-18 DIAGNOSIS — Z888 Allergy status to other drugs, medicaments and biological substances status: Secondary | ICD-10-CM

## 2020-06-18 DIAGNOSIS — J9601 Acute respiratory failure with hypoxia: Principal | ICD-10-CM | POA: Diagnosis present

## 2020-06-18 DIAGNOSIS — Z87891 Personal history of nicotine dependence: Secondary | ICD-10-CM

## 2020-06-18 DIAGNOSIS — R54 Age-related physical debility: Secondary | ICD-10-CM | POA: Diagnosis present

## 2020-06-18 DIAGNOSIS — R4701 Aphasia: Secondary | ICD-10-CM | POA: Diagnosis not present

## 2020-06-18 DIAGNOSIS — G620 Drug-induced polyneuropathy: Secondary | ICD-10-CM | POA: Diagnosis present

## 2020-06-18 DIAGNOSIS — F12929 Cannabis use, unspecified with intoxication, unspecified: Secondary | ICD-10-CM | POA: Diagnosis present

## 2020-06-18 DIAGNOSIS — Z66 Do not resuscitate: Secondary | ICD-10-CM | POA: Diagnosis present

## 2020-06-18 IMAGING — DX DG CHEST 1V PORT
1 series · 1 of 1 positions shown · non-contrast
Comparison: [DATE]

CLINICAL DATA: Dyspnea

EXAM:
PORTABLE CHEST 1 VIEW

[chest ap]
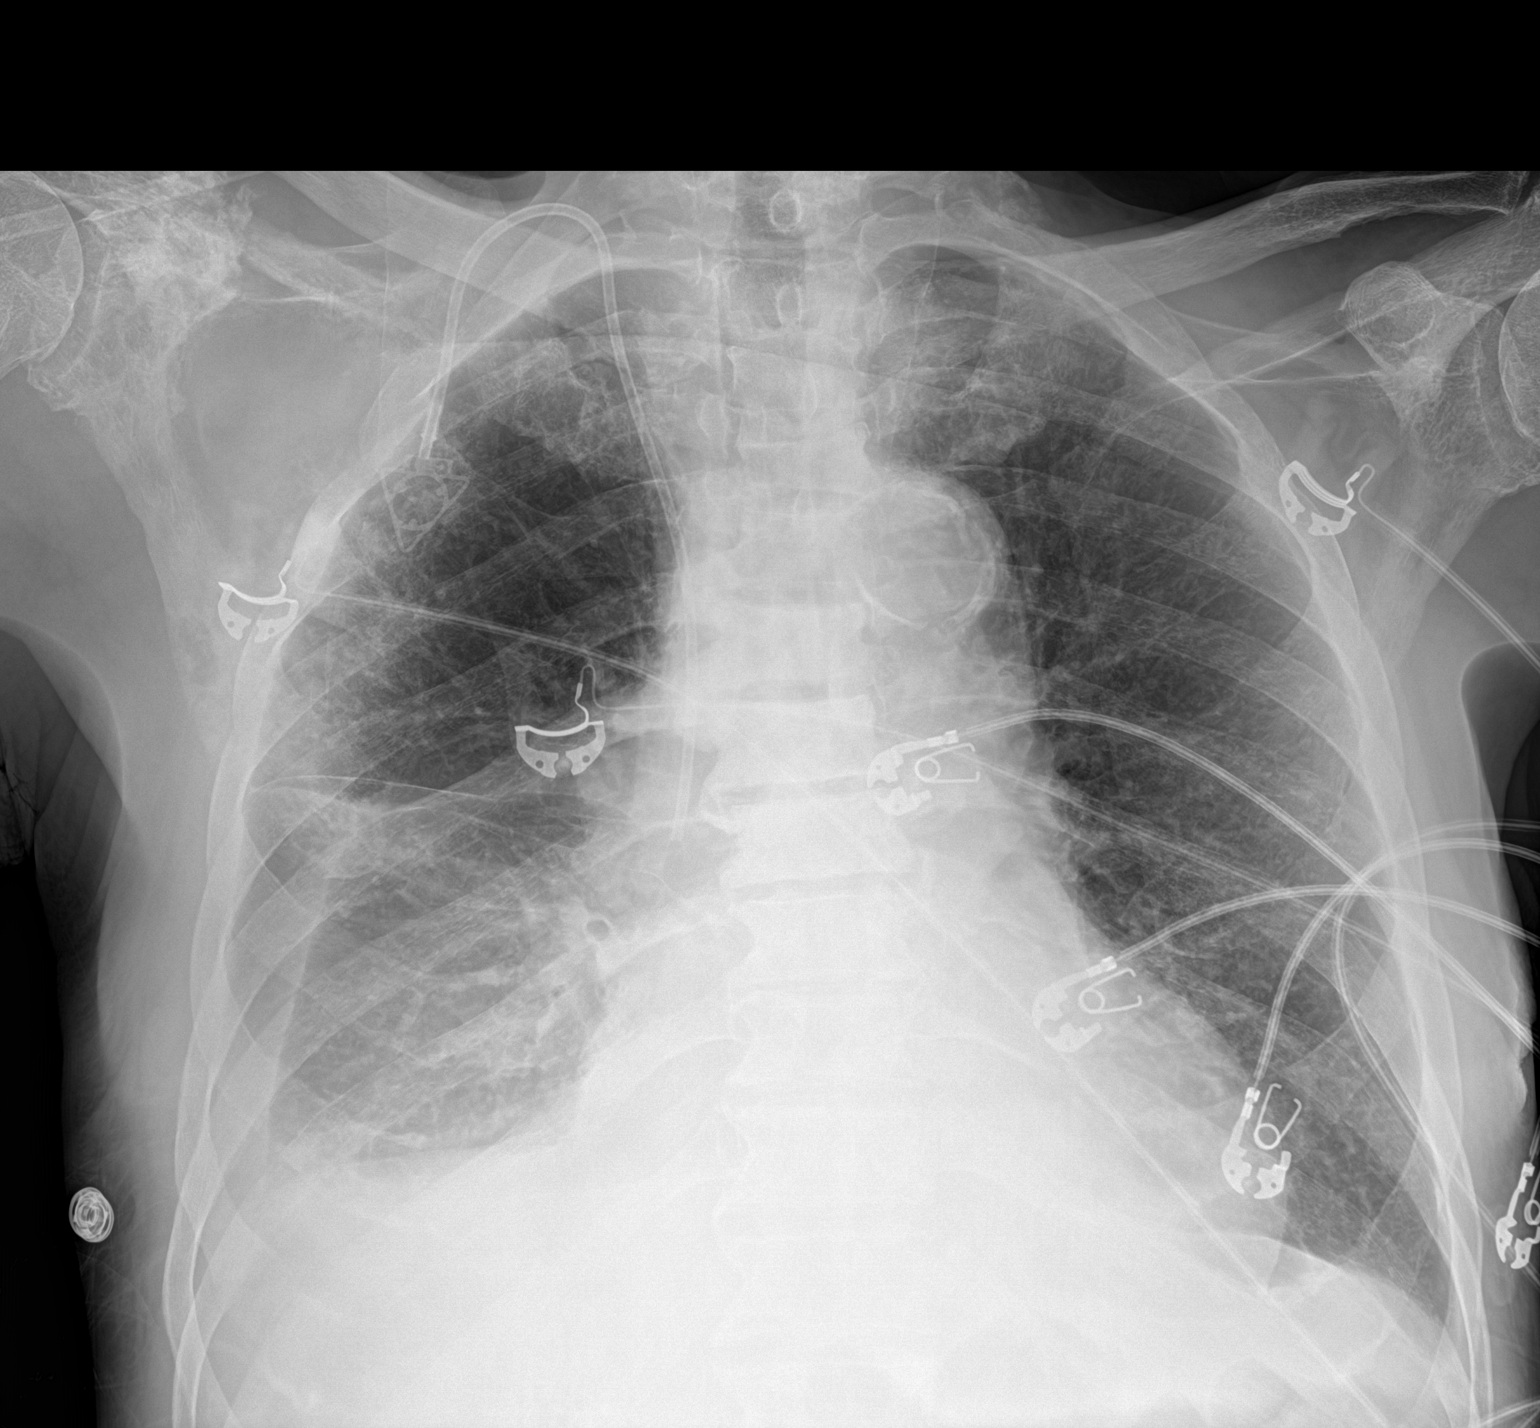

[1 of 1 positions shown; findings below may reference images not displayed]

FINDINGS: Small to moderate right pleural effusion has developed with mild
right basilar compressive atelectasis. Scattered peripheral
pulmonary infiltrates are seen within the right upper and mid lung
zone, likely infectious or inflammatory in the acute setting. Left
lung is clear. No pneumothorax. No pleural effusion on the left.
Right internal jugular chest port is seen with its tip within the
superior vena cava. Cardiac size is within normal limits. Pulmonary
vascularity is normal. Mixed lytic and sclerotic metastasis is seen
involving the right coracoid process and right scapula. Sclerosis of
multiple midthoracic vertebral bodies is in keeping with osseous
metastatic disease related to the patient's underlying metastatic
prostate cancer.
IMPRESSION: Scattered pulmonary infiltrates within the right mid lung zone, most
in keeping with atypical infection in the appropriate clinical
setting, with associated moderate right parapneumonic effusion.

## 2020-06-18 MED ORDER — MORPHINE SULFATE (PF) 4 MG/ML IV SOLN
4.0000 mg | Freq: Once | INTRAVENOUS | Status: AC
  Administered 2020-06-19: 4 mg via INTRAVENOUS
  Filled 2020-06-18: qty 1

## 2020-06-18 MED ORDER — ONDANSETRON HCL 4 MG/2ML IJ SOLN
4.0000 mg | Freq: Once | INTRAMUSCULAR | Status: AC
  Administered 2020-06-19: 4 mg via INTRAVENOUS
  Filled 2020-06-18: qty 2

## 2020-06-18 NOTE — ED Provider Notes (Signed)
Emergency Department Provider Note   I have reviewed the triage vital signs and the nursing notes.   HISTORY  Chief Complaint Shortness of Breath   HPI Dennis Fischer is a 74 y.o. male with past medical history of metastatic prostate cancer currently under hospice management presents with acute onset shortness of breath which began tonight.  He denies associated chest pain or heart palpitations.  He denies any similar symptoms in the past.  He does not use oxygen at home.  The shortness of breath was making him feel uncomfortable and so he called EMS who found him on scene to have O2 sats of 85%.  He was started on 10 L nonrebreather and transported to the emergency department.  Initiated a goals of care discussion with the patient and he would like to focus on symptom management.  He does not want significant testing.  He does not want blood work done or CT scans.  He is okay with chest x-ray and Covid swab testing. Denies any active distress or symptoms at this time.  He lives at home.  His primary contact person is his significant other, [name], but he does not want me to call her to update her on his situation.   Past Medical History:  Diagnosis Date  . Acute blood loss anemia   . Hypertension   . Metastatic cancer to bone ([place]) dx'd 2018  . Prostate cancer [facility])     Patient Active Problem List   Diagnosis Date Noted  . SAH (subarachnoid hemorrhage) ([place]) 05/24/2020  . Hospice care patient 05/24/2020  . Acute anemia 05/23/2020  . Pancytopenia ([place]) 05/23/2020  . Subarachnoid bleed ([place]) 05/23/2020  . Hypotension 05/23/2020  . AMS (altered mental status) 05/23/2020  . HLD (hyperlipidemia) 05/23/2020  . Peripheral neuropathy 05/23/2020  . Sore on ankle 04/04/2020  . Hypokalemia 08/11/2019  . Anemia in neoplastic disease 08/11/2019  . Dark urine 08/11/2019  . Sepsis ([place]) 08/03/2019  . Noninfectious colitis 08/03/2019  . Constipation 05/15/2019  . Elevated BUN   .  Transaminitis   . Hypoalbuminemia due to protein-calorie malnutrition ([place])   . Steroid-induced hyperglycemia   . Essential hypertension   . Metastatic cancer to spine ([place]) 03/11/2019  . Neuropathic pain   . Benign essential HTN   . Cocaine use 03/10/2019  . Cord compression myelopathy ([place])   . Spinal cord compression ([place]) 03/09/2019  . Prostate cancer metastatic to bone ([place]) 12/22/2018  . Port-A-Cath in place 07/02/2018  . Goals of care, counseling/discussion 01/20/2018  . Right knee pain 01/17/2018  . Prostate cancer ([place]) 01/22/2017  . Prediabetes 03/30/2016  . Positive FIT (fecal immunochemical test) 11/22/2015  . GERD (gastroesophageal reflux disease) 12/21/2014  . Age-related nuclear cataract of both eyes 01/13/2013  . Hypertensive retinopathy 01/13/2013  . Essential (primary) hypertension 11/25/2012  . Pure hypercholesterolemia 11/25/2012  . History of total hip replacement 08/28/2010  . Tobacco dependence syndrome 03/22/2006    Past Surgical History:  Procedure Laterality Date  . HIP SURGERY Bilateral   . IR IMAGING GUIDED PORT INSERTION  04/22/2018  . LAMINECTOMY N/A 03/09/2019   Procedure: THORACIC SEVEN - THORACIC EIGHT - THORACIC NINE LAMINECTOMY WITH RESECTION OF EPIDURAL TUMOR;  Surgeon: Earnie Larsson, MD;  Location: [facility];  Service: Neurosurgery;  Laterality: N/A;    Allergies Lisinopril, Cat hair extract, Mango flavor, Peanut-containing drug products, Pistachio nut (diagnostic), and Sunflower oil  Family History  Problem Relation Age of Onset  . Prostate cancer Father  Social History Social History   Tobacco Use  . Smoking status: Former Smoker    Packs/day: 0.25    Years: 50.00    Pack years: 12.50    Types: Cigarettes    Quit date: 09/10/2014    Years since quitting: 5.7  . Smokeless tobacco: Never Used  . Tobacco comment: Reports smoking only when he drank.  Vaping Use  . Vaping Use: Never used  Substance Use Topics  . Alcohol use: Yes     Comment: Once every 2 weeks.   . Drug use: Yes    Types: Marijuana    Comment: Medical     Review of Systems  Constitutional: No fever/chills Eyes: No visual changes. ENT: No sore throat. Cardiovascular: Denies chest pain. Respiratory: Positive shortness of breath. Gastrointestinal: No abdominal pain.  No nausea, no vomiting.  No diarrhea.  No constipation. Genitourinary: Negative for dysuria. Musculoskeletal: Negative for back pain. Skin: Negative for rash. Neurological: Negative for headaches, focal weakness or numbness.  10-point ROS otherwise negative.  ____________________________________________   PHYSICAL EXAM:  VITAL SIGNS: ED Triage Vitals  Enc Vitals Group     BP 06/15/2020 2236 (!) 100/53     Pulse Rate 07/07/2020 2238 (!) 35     Resp 06/25/2020 2236 20     Temp 07/02/2020 2238 97.6 F (36.4 C)     Temp Source 07/08/2020 2238 Oral     SpO2 06/24/2020 2235 96 %   Constitutional: Alert and oriented. Well appearing and in no acute distress. Eyes: Conjunctivae are normal.  Head: Atraumatic. Nose: No congestion/rhinnorhea. Mouth/Throat: Mucous membranes are moist.   Neck: No stridor.  Cardiovascular: Tachycardia. Good peripheral circulation. Grossly normal heart sounds.   Respiratory: Slight increased respiratory effort.  No retractions. Lungs CTAB. Gastrointestinal: No distention.  Musculoskeletal: No gross deformities of extremities. Neurologic:  Normal speech and language.  Skin:  Skin is warm, dry and intact. No rash noted.   ____________________________________________   LABS (all labs ordered are listed, but only abnormal results are displayed)  Labs Reviewed  RESPIRATORY PANEL BY RT PCR (FLU A&B, COVID)   ____________________________________________  EKG   EKG Interpretation  Date/Time:  Saturday June 18 2020 22:36:12 EDT Ventricular Rate:  122 PR Interval:    QRS Duration: 76 QT Interval:  309 QTC Calculation: 441 R Axis:   69 Text  Interpretation: Sinus tachycardia Abnormal T, consider ischemia, diffuse leads No STEMI Confirmed by Nanda Quinton [phone]) on 06/19/2020 12:25:47 AM       ____________________________________________  RADIOLOGY  DG Chest Portable 1 View  Result Date: 06/19/2020 CLINICAL DATA:  Dyspnea EXAM: PORTABLE CHEST 1 VIEW COMPARISON:  05/17/2018 FINDINGS: Small to moderate right pleural effusion has developed with mild right basilar compressive atelectasis. Scattered peripheral pulmonary infiltrates are seen within the right upper and mid lung zone, likely infectious or inflammatory in the acute setting. Left lung is clear. No pneumothorax. No pleural effusion on the left. Right internal jugular chest port is seen with its tip within the superior vena cava. Cardiac size is within normal limits. Pulmonary vascularity is normal. Mixed lytic and sclerotic metastasis is seen involving the right coracoid process and right scapula. Sclerosis of multiple midthoracic vertebral bodies is in keeping with osseous metastatic disease related to the patient's underlying metastatic prostate cancer. IMPRESSION: Scattered pulmonary infiltrates within the right mid lung zone, most in keeping with atypical infection in the appropriate clinical setting, with associated moderate right parapneumonic effusion. Electronically Signed   By: Fidela Salisbury  MD   On: 06/19/2020 00:01    ____________________________________________   PROCEDURES  Procedure(s) performed:   Procedures  None  ____________________________________________   INITIAL IMPRESSION / ASSESSMENT AND PLAN / ED COURSE  Pertinent labs & imaging results that were available during my care of the patient were reviewed by me and considered in my medical decision making (see chart for details).   Patient presents to the emergency department for evaluation of shortness of breath which is acute onset.  Patient is feeling much improved on oxygen.  Unclear if this  represents developing infection, PE, ACS.  Patient is under the care of hospice and after Henrry Feil discussion he does not want significant testing.  He is okay with a chest x-ray and Covid test.  He would like to stay on oxygen as is making him more comfortable.  He agrees with plan to access his port for morphine and Zofran.  I called to discuss the case with the hospitalist triage nurse.  They can have the hospital liaison see the patient first thing tomorrow morning to try and get inpatient hospice placement.  Patient is okay with this plan.  Discussed with the patient I would like to call and update family or significant others that he does not want me to call anyone right now.   Patient continuing to rest comfortably. Awaiting hospice evaluation this AM.  ____________________________________________  FINAL CLINICAL IMPRESSION(S) / ED DIAGNOSES  Final diagnoses:  SOB (shortness of breath)  Hypoxemia    MEDICATIONS GIVEN DURING THIS VISIT:  Medications  sodium chloride flush (NS) 0.9 % injection 10-40 mL (10 mLs Intracatheter Given 06/19/20 0120)  sodium chloride flush (NS) 0.9 % injection 10-40 mL (has no administration in time range)  Chlorhexidine Gluconate Cloth 2 % PADS 6 each (has no administration in time range)  morphine 4 MG/ML injection 4 mg (4 mg Intravenous Given 06/19/20 0115)  ondansetron (ZOFRAN) injection 4 mg (4 mg Intravenous Given 06/19/20 0116)    Note:  This document was prepared using Dragon voice recognition software and may include unintentional dictation errors.  Nanda Quinton, MD, [facility] Emergency Medicine    Barrett Holthaus, [facility], MD 06/19/20 [phone]

## 2020-06-18 NOTE — ED Triage Notes (Signed)
Pt coming from home with EMS with abd pain that causes coughing fits, which causes desaturation and diff breathing. Pt with late stage prostate cancer which has metastasized. Pt 85% PTA and placed on 10L NRB.

## 2020-06-19 ENCOUNTER — Other Ambulatory Visit: Payer: Self-pay

## 2020-06-19 ENCOUNTER — Encounter (HOSPITAL_COMMUNITY): Payer: Self-pay | Admitting: Family Medicine

## 2020-06-19 DIAGNOSIS — R0602 Shortness of breath: Secondary | ICD-10-CM | POA: Diagnosis not present

## 2020-06-19 DIAGNOSIS — C61 Malignant neoplasm of prostate: Secondary | ICD-10-CM

## 2020-06-19 DIAGNOSIS — Z515 Encounter for palliative care: Secondary | ICD-10-CM | POA: Insufficient documentation

## 2020-06-19 DIAGNOSIS — R54 Age-related physical debility: Secondary | ICD-10-CM | POA: Diagnosis present

## 2020-06-19 DIAGNOSIS — Z8042 Family history of malignant neoplasm of prostate: Secondary | ICD-10-CM | POA: Diagnosis not present

## 2020-06-19 DIAGNOSIS — Z66 Do not resuscitate: Secondary | ICD-10-CM | POA: Diagnosis present

## 2020-06-19 DIAGNOSIS — G620 Drug-induced polyneuropathy: Secondary | ICD-10-CM | POA: Diagnosis present

## 2020-06-19 DIAGNOSIS — R0902 Hypoxemia: Secondary | ICD-10-CM | POA: Diagnosis present

## 2020-06-19 DIAGNOSIS — J9601 Acute respiratory failure with hypoxia: Secondary | ICD-10-CM | POA: Diagnosis present

## 2020-06-19 DIAGNOSIS — C7951 Secondary malignant neoplasm of bone: Secondary | ICD-10-CM

## 2020-06-19 DIAGNOSIS — R4701 Aphasia: Secondary | ICD-10-CM | POA: Diagnosis not present

## 2020-06-19 DIAGNOSIS — Z888 Allergy status to other drugs, medicaments and biological substances status: Secondary | ICD-10-CM | POA: Diagnosis not present

## 2020-06-19 DIAGNOSIS — Z87891 Personal history of nicotine dependence: Secondary | ICD-10-CM | POA: Diagnosis not present

## 2020-06-19 DIAGNOSIS — Z20822 Contact with and (suspected) exposure to covid-19: Secondary | ICD-10-CM | POA: Diagnosis present

## 2020-06-19 DIAGNOSIS — F12929 Cannabis use, unspecified with intoxication, unspecified: Secondary | ICD-10-CM | POA: Diagnosis present

## 2020-06-19 DIAGNOSIS — R7303 Prediabetes: Secondary | ICD-10-CM | POA: Diagnosis present

## 2020-06-19 DIAGNOSIS — E785 Hyperlipidemia, unspecified: Secondary | ICD-10-CM | POA: Diagnosis present

## 2020-06-19 DIAGNOSIS — I10 Essential (primary) hypertension: Secondary | ICD-10-CM | POA: Diagnosis present

## 2020-06-19 DIAGNOSIS — Z96643 Presence of artificial hip joint, bilateral: Secondary | ICD-10-CM | POA: Diagnosis present

## 2020-06-19 DIAGNOSIS — E7800 Pure hypercholesterolemia, unspecified: Secondary | ICD-10-CM | POA: Diagnosis present

## 2020-06-19 DIAGNOSIS — D61818 Other pancytopenia: Secondary | ICD-10-CM | POA: Diagnosis present

## 2020-06-19 DIAGNOSIS — J918 Pleural effusion in other conditions classified elsewhere: Secondary | ICD-10-CM | POA: Diagnosis present

## 2020-06-19 DIAGNOSIS — K219 Gastro-esophageal reflux disease without esophagitis: Secondary | ICD-10-CM | POA: Diagnosis present

## 2020-06-19 DIAGNOSIS — Z91010 Allergy to peanuts: Secondary | ICD-10-CM | POA: Diagnosis not present

## 2020-06-19 DIAGNOSIS — Z79899 Other long term (current) drug therapy: Secondary | ICD-10-CM | POA: Diagnosis not present

## 2020-06-19 DIAGNOSIS — Z7189 Other specified counseling: Secondary | ICD-10-CM | POA: Insufficient documentation

## 2020-06-19 LAB — RESPIRATORY PANEL BY RT PCR (FLU A&B, COVID)
Influenza A by PCR: NEGATIVE
Influenza B by PCR: NEGATIVE
SARS Coronavirus 2 by RT PCR: NEGATIVE

## 2020-06-19 MED ORDER — LORAZEPAM 1 MG PO TABS: 1.0000 mg | ORAL_TABLET | ORAL | Status: DC | PRN

## 2020-06-19 MED ORDER — MORPHINE SULFATE (CONCENTRATE) 10 MG/0.5ML PO SOLN: 10.0000 mg | ORAL | Status: DC | PRN

## 2020-06-19 MED ORDER — GLYCOPYRROLATE 1 MG PO TABS: 1.0000 mg | ORAL_TABLET | ORAL | Status: DC | PRN

## 2020-06-19 MED ORDER — MORPHINE SULFATE (PF) 2 MG/ML IV SOLN
2.0000 mg | INTRAVENOUS | Status: DC | PRN
  Administered 2020-06-19: 2 mg via INTRAVENOUS
  Filled 2020-06-19: qty 1

## 2020-06-19 MED ORDER — SODIUM CHLORIDE 0.9% FLUSH: 10.0000 mL | INTRAVENOUS | Status: DC | PRN

## 2020-06-19 MED ORDER — MORPHINE SULFATE (PF) 2 MG/ML IV SOLN: 2.0000 mg | Freq: Once | INTRAVENOUS | Status: DC

## 2020-06-19 MED ORDER — MORPHINE 100MG IN NS 100ML (1MG/ML) PREMIX INFUSION
2.0000 mg/h | INTRAVENOUS | Status: DC
  Administered 2020-06-19: 2 mg/h via INTRAVENOUS
  Filled 2020-06-19: qty 100

## 2020-06-19 MED ORDER — CHLORHEXIDINE GLUCONATE CLOTH 2 % EX PADS
6.0000 | MEDICATED_PAD | Freq: Every day | CUTANEOUS | Status: DC
  Administered 2020-06-20: 6 via TOPICAL

## 2020-06-19 MED ORDER — GLYCOPYRROLATE 0.2 MG/ML IJ SOLN: 0.2000 mg | INTRAMUSCULAR | Status: DC | PRN

## 2020-06-19 MED ORDER — GLYCOPYRROLATE 0.2 MG/ML IJ SOLN
0.2000 mg | INTRAMUSCULAR | Status: DC | PRN
  Administered 2020-06-19 – 2020-06-20 (×2): 0.2 mg via INTRAVENOUS
  Filled 2020-06-19 (×2): qty 1

## 2020-06-19 MED ORDER — SODIUM CHLORIDE 0.9% FLUSH
10.0000 mL | Freq: Two times a day (BID) | INTRAVENOUS | Status: DC
  Administered 2020-06-19: 20 mL
  Administered 2020-06-19 – 2020-06-20 (×3): 10 mL

## 2020-06-19 MED ORDER — QUETIAPINE FUMARATE 25 MG PO TABS
25.0000 mg | ORAL_TABLET | Freq: Every day | ORAL | Status: DC
  Filled 2020-06-19: qty 1

## 2020-06-19 MED ORDER — AQUAPHOR EX OINT
TOPICAL_OINTMENT | Freq: Two times a day (BID) | CUTANEOUS | Status: DC
  Filled 2020-06-19: qty 50

## 2020-06-19 MED ORDER — POLYVINYL ALCOHOL 1.4 % OP SOLN: 1.0000 [drp] | Freq: Four times a day (QID) | OPHTHALMIC | Status: DC | PRN

## 2020-06-19 MED ORDER — LORAZEPAM 2 MG/ML PO CONC: 1.0000 mg | ORAL | Status: DC | PRN

## 2020-06-19 MED ORDER — HYDROCOD POLST-CPM POLST ER 10-8 MG/5ML PO SUER
5.0000 mL | Freq: Two times a day (BID) | ORAL | Status: DC
  Administered 2020-06-19: 5 mL via ORAL
  Filled 2020-06-19: qty 5

## 2020-06-19 MED ORDER — MORPHINE BOLUS VIA INFUSION
2.0000 mg | INTRAVENOUS | Status: DC | PRN
  Administered 2020-06-19 – 2020-06-20 (×3): 2 mg via INTRAVENOUS
  Filled 2020-06-19: qty 2

## 2020-06-19 MED ORDER — LORAZEPAM 2 MG/ML IJ SOLN
1.0000 mg | INTRAMUSCULAR | Status: DC | PRN
  Administered 2020-06-19 (×2): 1 mg via INTRAVENOUS
  Filled 2020-06-19 (×2): qty 1

## 2020-06-19 NOTE — ED Notes (Signed)
Family at bedside. Pt took off his oxygen. Family aware that he is not wanting oxygen on. Refusing being connected to monitor keeps taking everything off. Pt is resting in bed.

## 2020-06-19 NOTE — Progress Notes (Addendum)
Manufacturing engineer [facility])  Mr. Schwertner is currently our hospice pt with a terminal diagnosis of prostate cancer.  Pt was sent to the ED last night for SOB.    Hospice triage RN updated this writer of discussion with Dr. Laverta Baltimore.   Noted that pt is now requiring O2 @ 15 lpm and he was not on any O2 at home.  Noted that pt wants to be the decision maker and is deferring updating his NOK at this time.  Currently, waiting for an update on bed status at both of our residential hospice facilities--although we did not have any yesterday, I do not anticipate that we will have a bed for him.  Will update attending once bed status has been determined.  Likely will need to be admitted as it does not appear that he has the support to go home with such high O2 needs at this time.  However, [facility] can order any DME that he needs should this be the decision that is made.  Venia Carbon RN, BSN, [facility] Liaison (in [place] under hospice or Epic chat)  **update, there are no beds at either of our residential hospice facilities today.

## 2020-06-19 NOTE — Progress Notes (Addendum)
Manufacturing engineer [facility]) Hospitalized Hospice Patient  Dennis Fischer is under hospice services with a terminal diagnosis of metastatic prostate cancer.  Dennis Fischer was transported to the ER after becoming increasing dyspneic and found to be hypoxic by EMS with saturations in the 80s. Goal was to move to [facility] for end of life care, however there are no beds that are available. Pt is being admitted to teaching services for dyspnea and symptom management while waiting for a bed at [facility]. This is a related hospital admission.  Visited pt at the bedside. Pt alert but confused, he does not speak in complete sentences and what he does say does not make sense. He endorses that he hurts, but does not specify where. He is fidgety and pulling off his EKG leads and clothing. I ask if he needs something for pain or anxiety and he states he is "fine".   V/S: 97.6 oral, 93/76, HR 115, RR 26, SPO2 pleth not reading, O2 has been removed by pt Labs: covid -, WBC 1.1, RBC 1.24, hgb 4.2, HCT 12.4, plt 15, RDW 16.9 Diagnostics: CT abd/pelvis - Moderate right and small left pleural effusions are partially visualized with associated bibasilar compressive atelectasis. Multiple pulmonary nodules are identified within the visualized lung bases. IVs/PRNs: morphine 4 mg IV x1, zofran 4 mg IV x 1, ativan 1 mg IV x 1, morphine infusion currently @ 2mg /hr  Problem List -Dyspnea - scattered pulmonary infiltrates, O2 as tolerated for comfort, now on morphine infusion -Pain - morphine infusion started -Prostate cancer - no further treatment, symptom management only  GOC: Clear. Pt DNR and wants no further work up, only to be comfortable. D/C planning: residential hospice at [facility], no beds to offer today. Family: Update, hospice SW spoke with wife at length. His two sons came in from [place] today to be with him. IDT: hospice team updated.   Venia Carbon RN, BSN, [facility] Liaison

## 2020-06-19 NOTE — Progress Notes (Signed)
   FMTS Attending Note: Dorris Singh, MD  Personal pager:  [phone] [facility] Pager:  [phone]   I  have seen and examined this patient, reviewed their chart. I have discussed this patient with the resident. Will sign note as able.  Patient seen twice today. Spoke with son Truddie Crumble.) who was at bedside. 74 year old with metastatic prostate cancer that has progressed despite androgen deprivation therapy. Recently admitted for pancytopenia and SAH, discharged on hospice. He has developed respiratory distress at home and requires admission for symptom management.   Consulted with palliative care, appreciate their guidance. Patient had increased WOB and discomfort on my examination, started morphine gtt. This improved. He is confused and removing oxygen throughout day, family aware. Remainder of comfort medications ordered. Will sign resident note as available.

## 2020-06-19 NOTE — Consult Note (Addendum)
Consultation Note Date: 06/19/2020   Patient Name: Dennis Fischer  DOB: [birth year]  MRN: [id number]  Age / Sex: 74 y.o., male  PCP: [name], MD Referring Physician: Gareth Morgan, MD  Reason for Consultation: Establishing goals of care  HPI/Patient Profile: 74 y.o. male  with past medical history of prostate cancer with mets to bone and spine- recent ED visit for subarachnoid hemorrhage, and pancytopenia (related to bone mets) at which time he was enrolled with Hospice.   Now admitted on 06/27/2020 with abdominal pain and shortness of breath. Palliative consulted for goals of care "hospice patient inpt vs outpatient".    Clinical Assessment and Goals of Care: Examined patient at bedside. He was somewhat aphasic, answered questions tangentially. He is able to tell me that he has advanced cancer and this is causing him "some life issues". Was not able to expand on what those issues are.  Noted per previous discussion with attending MD Long- patient has expressed desire for limited interventions. Per Hospice RN note- they are seeking placement at [facility]- but currently no beds available.  I called and spoke to Mr. Yasui significant other- [name]. She expressed great distress at patient's decline.  He is not ambulatory. He sleeps most of the day. He is not eating or drinking.  We discussed that patient is likely at end of life. Goals were clarified for comfort measures. She is in agreement with seeking residential hospice placement.   Primary Decision Maker NEXT OF KIN- Thurmond Butts    SUMMARY OF RECOMMENDATIONS -Symptom management and comfort orders entered -Hospice liaison is working on residential Hospice placement- due to limited bed availability will likely have to be admitted for symptom management until bed is available.  Code Status/Advance Care Planning:  DNR  Prognosis:    < 2  weeks  Discharge Planning: Hospice facility  Primary Diagnoses: Present on Admission: **None**   I have reviewed the medical record, interviewed the patient and family, and examined the patient. The following aspects are pertinent.  Past Medical History:  Diagnosis Date  . Acute blood loss anemia   . Hypertension   . Metastatic cancer to bone ([place]) dx'd 2018  . Prostate cancer [facility])    Social History   Socioeconomic History  . Marital status: Significant Other    Spouse name: [name]  . Number of children: 3  . Years of education: Not on file  . Highest education level: Not on file  Occupational History  . Not on file  Tobacco Use  . Smoking status: Former Smoker    Packs/day: 0.25    Years: 50.00    Pack years: 12.50    Types: Cigarettes    Quit date: 09/10/2014    Years since quitting: 5.7  . Smokeless tobacco: Never Used  . Tobacco comment: Reports smoking only when he drank.  Vaping Use  . Vaping Use: Never used  Substance and Sexual Activity  . Alcohol use: Yes    Comment: Once every 2 weeks.   . Drug  use: Yes    Types: Marijuana    Comment: Medical   . Sexual activity: Not Currently  Other Topics Concern  . Not on file  Social History Narrative  . Not on file   Social Determinants of Health   Financial Resource Strain:   . Difficulty of Paying Living Expenses: Not on file  Food Insecurity:   . Worried About Charity fundraiser in the Last Year: Not on file  . Ran Out of Food in the Last Year: Not on file  Transportation Needs:   . Lack of Transportation (Medical): Not on file  . Lack of Transportation (Non-Medical): Not on file  Physical Activity:   . Days of Exercise per Week: Not on file  . Minutes of Exercise per Session: Not on file  Stress:   . Feeling of Stress : Not on file  Social Connections:   . Frequency of Communication with Friends and Family: Not on file  . Frequency of Social Gatherings with Friends and Family: Not on file  .  Attends Religious Services: Not on file  . Active Member of Clubs or Organizations: Not on file  . Attends Archivist Meetings: Not on file  . Marital Status: Not on file   Scheduled Meds: . Chlorhexidine Gluconate Cloth  6 each Topical Daily  . chlorpheniramine-HYDROcodone  5 mL Oral Q12H  . mineral oil-hydrophilic petrolatum   Topical BID  . QUEtiapine  25 mg Oral QHS  . sodium chloride flush  10-40 mL Intracatheter Q12H   Continuous Infusions: PRN Meds:.glycopyrrolate **OR** glycopyrrolate **OR** glycopyrrolate, LORazepam **OR** LORazepam **OR** LORazepam, morphine injection, morphine CONCENTRATE **OR** morphine CONCENTRATE, polyvinyl alcohol, sodium chloride flush Medications Prior to Admission:  Prior to Admission medications   Medication Sig Start Date End Date Taking? Authorizing Provider  acetaminophen (TYLENOL) 500 MG tablet Take 1,000 mg by mouth every 6 (six) hours as needed for headache (pain).    [provider]  LORazepam (ATIVAN) 0.5 MG tablet Take 1 tablet (0.5 mg total) by mouth 2 (two) times daily as needed for anxiety. 05/24/20 05/24/21  Lavina Hamman, MD  mineral oil-hydrophilic petrolatum (AQUAPHOR) ointment Apply 1 application topically daily. Apply to buttocks and feet    [provider]  morphine (ROXANOL) 20 MG/ML concentrated solution Take 0.25 mLs (5 mg total) by mouth every 2 (two) hours as needed for moderate pain, severe pain, anxiety or shortness of breath. 05/24/20   Lavina Hamman, MD  Multiple Vitamin (MULTIVITAMIN WITH MINERALS) TABS tablet Take 1 tablet by mouth every morning.     [provider]  olaparib (LYNPARZA) 150 MG tablet Take 2 tablets (300 mg total) by mouth 2 (two) times daily. Swallow whole. May take with food to decrease nausea and vomiting. 03/31/20   Wyatt Portela, MD  omeprazole (PRILOSEC) 20 MG capsule Take 20 mg by mouth daily as needed (acid reflux/heartburn).    [provider]  ondansetron  (ZOFRAN) 8 MG tablet Take 1 tablet (8 mg total) by mouth every 8 (eight) hours as needed for nausea or vomiting. 12/03/19   Wyatt Portela, MD  polyethylene glycol (MIRALAX / GLYCOLAX) 17 g packet Take 17 g by mouth daily. 05/25/20   Lavina Hamman, MD  Psyllium (METAMUCIL PO) Take 1 Scoop by mouth daily.    [provider]  QUEtiapine (SEROQUEL) 25 MG tablet Take 1 tablet (25 mg total) by mouth at bedtime. 05/24/20   Lavina Hamman, MD  senna-docusate (  SENOKOT-S) 8.6-50 MG tablet Take 2 tablets by mouth 2 (two) times daily. Patient taking differently: Take 2-3 tablets by mouth daily as needed (constipation).  07/31/19   Wyatt Portela, MD   Allergies  Allergen Reactions  . Lisinopril Swelling    Facial swelling  . Cat Hair Extract Swelling    eyes swell  . Mango Flavor Swelling    throat swells with mango  . Peanut-Containing Drug Products Other (See Comments)    Unknown reaction  . Pistachio Nut (Diagnostic) Other (See Comments)    hands and feet burn/itch  . Sunflower Oil Other (See Comments)    Sunflower seed allergy - unknown reaction   Review of Systems  Constitutional: Positive for activity change, appetite change and fatigue.    Physical Exam Vitals and nursing note reviewed.  Constitutional:      Comments: Frail, cachetic  Pulmonary:     Comments: Increased effort Skin:    General: Skin is warm and dry.  Neurological:     Mental Status: He is alert. He is disoriented.  Psychiatric:     Comments: Tangential     Vital Signs: BP 129/64   Pulse 88   Temp 97.6 F (36.4 C) (Oral)   Resp (!) 24   Ht 5\' 11"  (1.803 m)   Wt 68 kg   SpO2 95%   BMI 20.91 kg/m  Pain Scale: 0-10   Pain Score: 0-No pain   SpO2: SpO2: 95 % O2 Device:SpO2: 95 % O2 Flow Rate: .O2 Flow Rate (L/min): 15 L/min  IO: Intake/output summary:   Intake/Output Summary (Last 24 hours) at 06/19/2020 1055 Last data filed at 06/19/2020 0120 Gross per 24 hour  Intake 10 ml  Output  --  Net 10 ml    LBM:   Baseline Weight: Weight: 68 kg Most recent weight: Weight: 68 kg     Palliative Assessment/Data: PPS: 20%     Thank you for this consult. Palliative medicine will continue to follow and assist as needed.   Time Total: 82 minutes Greater than 50%  of this time was spent counseling and coordinating care related to the above assessment and plan.  Signed by: Mariana Kaufman, AGNP-C Palliative Medicine    Please contact Palliative Medicine Team phone at [phone] for questions and concerns.  For individual provider: See Shea Evans

## 2020-06-19 NOTE — H&P (Addendum)
[facility] Admission History and Physical Service Pager: [phone]  Patient name: Dennis Fischer Medical record number: [id number] Date of birth: [birth year] Age: 74 y.o. Gender: male  Primary Care Provider: [name], MD Consultants: Palliative care Code Status: DNR  Chief Complaint: Dyspnea  Assessment and Plan: Dennis Fischer is a 74 y.o. male presenting with dyspnea. PMH is significant for metastatic prostate cancer (mets to bone and spine).  Acute Hypoxic Respiratory Failure Patient presented with new onset dyspnea with hypoxemia requiring 15 L NRB in the ED. CXR showed scattered pulmonary infiltrates within the right mid lung zone with moderate right parapneumonic effusion.  Could represent pneumonia, lung mets, or other etiologies; however, given that patient is a hospice patient and does not want further work-up, will not pursue further diagnostic testing. On exam, he was initially non-verbal but after a small amount of emesis pt was verbal and asking for [place]. Pt removes his oxygen frequently. O2 sats 42-100% while in the room though O2 sensor is on his foot.  Will treat with symptom management while awaiting residential hospice placement. - admit to FPTS, med-tele, Dr. Owens Shark attending - supplemental oxygen - morphine infusion - lorazepam prn - glycopyrrolate prn - Tussionex prn - quetiapine qhs - comfort measures  Goals of care Pt is DNR. Palliative care consulted, appreciate involvement. Plan for symptom management and comfort care until bed available at residential hospice. Wife reports his son's area coming in from [place]. Family would like to go to [facility] if possible.  - palliative care following, appreciate recommendations   Hx of Prostate Cancer  Metastatic Bone Cancer Diagnosed with prostate cancer in 2016 which spread to the bone and spine a few years later. He developed neuropathy secondary to chemotherapy and was unable to  complete all the cycles.  - No further treatment.   HTN  No further treatment   FEN/GI: DYS 3 Prophylaxis: none  Disposition: med-tele, pending residential hospice placement  History of Present Illness:  Dennis Fischer is a 74 y.o. male with a history of metastatic prostate cancer admitted for comfort care.   LEVEL 5 CAVEAT DUE TO MENTAL STATUS History obtained primarily through significant other Joanne Gavel.  Patient presented to ED last night with acute onset of dyspnea.  Per wife [name], patient began having abdominal pain over the past week and developed new shortness of breath last night.  [name] states that he had been pretty independent with mobility, but noted a decline over the past few weeks.  EMS arrived on scene and found him to have SPO2 of 85%, so he was started on 10 L nonrebreather and transported to the ED.   In the ED, patient declined significant testing but agreed to have CXR and Covid testing done.    Review Of Systems: Per HPI with the following additions:   Review of Systems  Unable to perform ROS: Mental status change    Patient Active Problem List   Diagnosis Date Noted  . Acute respiratory failure with hypoxia ([place]) 06/19/2020  . SOB (shortness of breath)   . Palliative care by specialist   . DNR (do not resuscitate)   . Comfort measures only status   . Advanced care planning/counseling discussion   . SAH (subarachnoid hemorrhage) ([place]) 05/24/2020  . Hospice care patient 05/24/2020  . Acute anemia 05/23/2020  . Pancytopenia ([place]) 05/23/2020  . Subarachnoid bleed ([place]) 05/23/2020  . Hypotension 05/23/2020  . AMS (altered mental status) 05/23/2020  . HLD (hyperlipidemia)  05/23/2020  . Peripheral neuropathy 05/23/2020  . Sore on ankle 04/04/2020  . Hypokalemia 08/11/2019  . Anemia in neoplastic disease 08/11/2019  . Dark urine 08/11/2019  . Sepsis ([place]) 08/03/2019  . Noninfectious colitis 08/03/2019  . Constipation 05/15/2019  . Elevated BUN    . Transaminitis   . Hypoalbuminemia due to protein-calorie malnutrition ([place])   . Steroid-induced hyperglycemia   . Essential hypertension   . Metastatic cancer to spine ([place]) 03/11/2019  . Neuropathic pain   . Benign essential HTN   . Cocaine use 03/10/2019  . Cord compression myelopathy ([place])   . Spinal cord compression ([place]) 03/09/2019  . Prostate cancer metastatic to bone ([place]) 12/22/2018  . Port-A-Cath in place 07/02/2018  . Goals of care, counseling/discussion 01/20/2018  . Right knee pain 01/17/2018  . Prostate cancer ([place]) 01/22/2017  . Prediabetes 03/30/2016  . Positive FIT (fecal immunochemical test) 11/22/2015  . GERD (gastroesophageal reflux disease) 12/21/2014  . Age-related nuclear cataract of both eyes 01/13/2013  . Hypertensive retinopathy 01/13/2013  . Essential (primary) hypertension 11/25/2012  . Pure hypercholesterolemia 11/25/2012  . History of total hip replacement 08/28/2010  . Tobacco dependence syndrome 03/22/2006    Past Medical History: Past Medical History:  Diagnosis Date  . Acute blood loss anemia   . Hypertension   . Metastatic cancer to bone ([place]) dx'd 2018  . Prostate cancer [facility])     Past Surgical History: Past Surgical History:  Procedure Laterality Date  . HIP SURGERY Bilateral   . IR IMAGING GUIDED PORT INSERTION  04/22/2018  . LAMINECTOMY N/A 03/09/2019   Procedure: THORACIC SEVEN - THORACIC EIGHT - THORACIC NINE LAMINECTOMY WITH RESECTION OF EPIDURAL TUMOR;  Surgeon: Earnie Larsson, MD;  Location: [facility];  Service: Neurosurgery;  Laterality: N/A;    Social History: Social History   Tobacco Use  . Smoking status: Former Smoker    Packs/day: 0.25    Years: 50.00    Pack years: 12.50    Types: Cigarettes    Quit date: 09/10/2014    Years since quitting: 5.7  . Smokeless tobacco: Never Used  . Tobacco comment: Reports smoking only when he drank.  Vaping Use  . Vaping Use: Never used  Substance Use Topics  . Alcohol use: Yes     Comment: Once every 2 weeks.   . Drug use: Yes    Types: Marijuana    Comment: Medical    Additional social history: Lives with significant other Joanne Gavel who is his primary caretaker.  He does have home health for about 3 hours a day Monday through Friday.  Please also refer to relevant sections of EMR.  Family History: Family History  Problem Relation Age of Onset  . Prostate cancer Father     Allergies and Medications: Allergies  Allergen Reactions  . Lisinopril Swelling    Facial swelling  . Cat Hair Extract Swelling    eyes swell  . Mango Flavor Swelling    throat swells with mango  . Peanut-Containing Drug Products Other (See Comments)    Unknown reaction  . Pistachio Nut (Diagnostic) Other (See Comments)    hands and feet burn/itch  . Sunflower Oil Other (See Comments)    Sunflower seed allergy - unknown reaction   No current facility-administered medications on file prior to encounter.   Current Outpatient Medications on File Prior to Encounter  Medication Sig Dispense Refill  . acetaminophen (TYLENOL) 500 MG tablet Take 1,000 mg by mouth every 6 (six) hours  as needed for headache (pain).    . LORazepam (ATIVAN) 0.5 MG tablet Take 1 tablet (0.5 mg total) by mouth 2 (two) times daily as needed for anxiety. 10 tablet 0  . mineral oil-hydrophilic petrolatum (AQUAPHOR) ointment Apply 1 application topically daily. Apply to buttocks and feet    . morphine (ROXANOL) 20 MG/ML concentrated solution Take 0.25 mLs (5 mg total) by mouth every 2 (two) hours as needed for moderate pain, severe pain, anxiety or shortness of breath. 15 mL 0  . Multiple Vitamin (MULTIVITAMIN WITH MINERALS) TABS tablet Take 1 tablet by mouth every morning.     Marland Kitchen olaparib (LYNPARZA) 150 MG tablet Take 2 tablets (300 mg total) by mouth 2 (two) times daily. Swallow whole. May take with food to decrease nausea and vomiting. 120 tablet 0  . omeprazole (PRILOSEC) 20 MG capsule Take 20 mg by mouth daily  as needed (acid reflux/heartburn).    . ondansetron (ZOFRAN) 8 MG tablet Take 1 tablet (8 mg total) by mouth every 8 (eight) hours as needed for nausea or vomiting. 30 tablet 1  . polyethylene glycol (MIRALAX / GLYCOLAX) 17 g packet Take 17 g by mouth daily. 14 each 0  . Psyllium (METAMUCIL PO) Take 1 Scoop by mouth daily.    . QUEtiapine (SEROQUEL) 25 MG tablet Take 1 tablet (25 mg total) by mouth at bedtime. 30 tablet 0  . senna-docusate (SENOKOT-S) 8.6-50 MG tablet Take 2 tablets by mouth 2 (two) times daily. (Patient taking differently: Take 2-3 tablets by mouth daily as needed (constipation). ) 60 tablet 1    Objective: BP 129/64   Pulse 88   Temp 97.6 F (36.4 C) (Oral)   Resp (!) 24   Ht 5\' 11"  (1.803 m)   Wt 68 kg   SpO2 95%   BMI 20.91 kg/m  Exam: General: Frail elderly male laying in bed, intermittently coughing, NRB displaced, NAD Eyes: EOMI Neck: supple Cardiovascular: Tachycardic, regular rhythm, no murmurs, left chest port Respiratory: CTAB anterior chest, respirations mildly labored  Gastrointestinal: soft, non-tender, +BS Ext: cold thin extremities, no edema Derm: dry skin, no rash Neuro: Alert, disoriented (thinks he is at home, able to state month but not year) Psych: Affect appropriate, slow speech with speech latency  Labs and Imaging: CBC BMET  No results for input(s): WBC, HGB, HCT, PLT in the last 168 hours. No results for input(s): NA, K, CL, CO2, BUN, CREATININE, GLUCOSE, CALCIUM in the last 168 hours.   DG Chest Portable 1 View  Result Date: 06/19/2020 CLINICAL DATA:  Dyspnea EXAM: PORTABLE CHEST 1 VIEW COMPARISON:  05/17/2018 FINDINGS: Small to moderate right pleural effusion has developed with mild right basilar compressive atelectasis. Scattered peripheral pulmonary infiltrates are seen within the right upper and mid lung zone, likely infectious or inflammatory in the acute setting. Left lung is clear. No pneumothorax. No pleural effusion on the  left. Right internal jugular chest port is seen with its tip within the superior vena cava. Cardiac size is within normal limits. Pulmonary vascularity is normal. Mixed lytic and sclerotic metastasis is seen involving the right coracoid process and right scapula. Sclerosis of multiple midthoracic vertebral bodies is in keeping with osseous metastatic disease related to the patient's underlying metastatic prostate cancer. IMPRESSION: Scattered pulmonary infiltrates within the right mid lung zone, most in keeping with atypical infection in the appropriate clinical setting, with associated moderate right parapneumonic effusion. Electronically Signed   By: Fidela Salisbury MD   On: 06/19/2020 00:01  Zola Button, MD 06/19/2020, 11:29 AM PGY-1, [place] Intern pager: [phone], text pages welcome   FPTS Upper-Level Resident Addendum   I have independently interviewed and examined the patient. I have discussed the above with the original author and agree with their documentation. My edits for correction/addition/clarification are in blue Please see also any attending notes.   Lyndee Hensen, DO PGY-2, [facility] 06/19/2020 5:52 PM  [place] Service pager: [phone] (text pages welcome through [place])

## 2020-06-19 NOTE — Progress Notes (Signed)
Patient up from the ED on 2mg  Morphine and resting comfortably in bed. Patient minimally responsive at this time. Clothing with patient. No family at this time but ED RN called spouse to inform her of new bed assignment.

## 2020-06-20 DIAGNOSIS — C7951 Secondary malignant neoplasm of bone: Secondary | ICD-10-CM | POA: Diagnosis not present

## 2020-06-20 DIAGNOSIS — Z515 Encounter for palliative care: Secondary | ICD-10-CM

## 2020-06-20 DIAGNOSIS — C61 Malignant neoplasm of prostate: Secondary | ICD-10-CM

## 2020-06-20 MED ORDER — SCOPOLAMINE 1 MG/3DAYS TD PT72
1.0000 | MEDICATED_PATCH | TRANSDERMAL | Status: DC
  Filled 2020-06-20: qty 1

## 2020-06-27 ENCOUNTER — Telehealth: Payer: Self-pay | Admitting: Family Medicine

## 2020-06-27 NOTE — Telephone Encounter (Signed)
Death Certificate was dropped off by [facility] and has been placed in PCP's box for completion. Please use blue or black ink. Please return to [facility] once completed.  The funeral home specifically requested certificate be given to Br. Owens Shark or Dr. Jeannine Kitten however Dr. [name] was his PCP.

## 2020-06-29 NOTE — Telephone Encounter (Signed)
Called [facility] to pick up DC.

## 2020-06-29 NOTE — Telephone Encounter (Signed)
Form completed and given to Ms. Burton.

## 2020-07-11 NOTE — Progress Notes (Signed)
Daily Progress Note   Patient Name: Dennis Fischer       Date: June 23, 2020 DOB: May 02, 1946  Age: 74 y.o. MRN#: [id number] Attending Physician: Martyn Malay, MD Primary Care Physician: [name], MD Admit Date: 07/01/2020  Reason for Consultation/Follow-up: Establishing goals of care and Terminal Care  Subjective: Patient in bed- appears to be actively dying. Comfortable.  Spouse- [name] is at bedside. She is hopeful for transfer to hospice facility.   Length of Stay: 1  Current Medications: Scheduled Meds:  . Chlorhexidine Gluconate Cloth  6 each Topical Daily  . chlorpheniramine-HYDROcodone  5 mL Oral Q12H  . mineral oil-hydrophilic petrolatum   Topical BID  . QUEtiapine  25 mg Oral QHS  . scopolamine  1 patch Transdermal Q72H  . sodium chloride flush  10-40 mL Intracatheter Q12H    Continuous Infusions: . morphine 2 mg/hr (06/19/20 1707)    PRN Meds: glycopyrrolate **OR** glycopyrrolate **OR** glycopyrrolate, LORazepam **OR** LORazepam **OR** LORazepam, morphine, morphine CONCENTRATE **OR** morphine CONCENTRATE, polyvinyl alcohol, sodium chloride flush       Vital Signs: BP (!) 73/42 (BP Location: Left Arm)   Pulse (!) 106   Temp 97.7 F (36.5 C) (Axillary)   Resp (!) 25   Ht 5\' 11"  (1.803 m)   Wt 68 kg   SpO2 (!) 88%   BMI 20.91 kg/m  SpO2: SpO2: (!) 88 % O2 Device: O2 Device: Nasal Cannula, High Flow Nasal Cannula O2 Flow Rate: O2 Flow Rate (L/min): 6 L/min  Intake/output summary:   Intake/Output Summary (Last 24 hours) at 23-Jun-2020 1405 Last data filed at 06/23/2020 1300 Gross per 24 hour  Intake 36.75 ml  Output --  Net 36.75 ml   LBM:   Baseline Weight: Weight: 68 kg Most recent weight: Weight: 68 kg       Palliative Assessment/Data: PPS:  10%     Patient Active Problem List   Diagnosis Date Noted  . Acute respiratory failure with hypoxia ([place]) 06/19/2020  . Hospice care for respiratory failure in setting of metastatic prostate cancer  06/19/2020  . SOB (shortness of breath)   . Palliative care by specialist   . DNR (do not resuscitate)   . Comfort measures only status   . Advanced care planning/counseling discussion   . SAH (subarachnoid hemorrhage) ([place]) 05/24/2020  .  Hospice care patient 05/24/2020  . Acute anemia 05/23/2020  . Pancytopenia ([place]) 05/23/2020  . Subarachnoid bleed ([place]) 05/23/2020  . Hypotension 05/23/2020  . AMS (altered mental status) 05/23/2020  . HLD (hyperlipidemia) 05/23/2020  . Peripheral neuropathy 05/23/2020  . Sore on ankle 04/04/2020  . Hypokalemia 08/11/2019  . Anemia in neoplastic disease 08/11/2019  . Dark urine 08/11/2019  . Sepsis ([place]) 08/03/2019  . Noninfectious colitis 08/03/2019  . Constipation 05/15/2019  . Elevated BUN   . Transaminitis   . Hypoalbuminemia due to protein-calorie malnutrition ([place])   . Steroid-induced hyperglycemia   . Essential hypertension   . Metastatic cancer to spine ([place]) 03/11/2019  . Neuropathic pain   . Benign essential HTN   . Cocaine use 03/10/2019  . Cord compression myelopathy ([place])   . Spinal cord compression ([place]) 03/09/2019  . Prostate cancer metastatic to bone ([place]) 12/22/2018  . Port-A-Cath in place 07/02/2018  . Goals of care, counseling/discussion 01/20/2018  . Right knee pain 01/17/2018  . Prostate cancer ([place]) 01/22/2017  . Prediabetes 03/30/2016  . Positive FIT (fecal immunochemical test) 11/22/2015  . GERD (gastroesophageal reflux disease) 12/21/2014  . Age-related nuclear cataract of both eyes 01/13/2013  . Hypertensive retinopathy 01/13/2013  . Essential (primary) hypertension 11/25/2012  . Pure hypercholesterolemia 11/25/2012  . History of total hip replacement 08/28/2010  . Tobacco dependence syndrome 03/22/2006     Palliative Care Assessment & Plan   Patient Profile: 74 y.o. male  with past medical history of prostate cancer with mets to bone and spine- recent ED visit for subarachnoid hemorrhage, and pancytopenia (related to bone mets) at which time he was enrolled with Hospice.   Now admitted on 07/10/2020 with abdominal pain and shortness of breath. Palliative consulted for goals of care "hospice patient inpt vs outpatient".    Assessment/Recommendations/Plan   Noted last pm patient did not want to wear oxygen- this is ok- if patient does not want to wear oxygen- ok to d/c- give morphine bolus and/or lorazepam for signs of air hunger or agitation  Patient appears to be close to last hours- will re-eval tomorrow for stability to transfer to residential Hospice facility  Goals of Care and Additional Recommendations:  Limitations on Scope of Treatment: Full Comfort Care  Code Status:  DNR  Prognosis:   Hours - Days  Discharge Planning:  Hospice facility vs hospital death    Thank you for allowing the Palliative Medicine Team to assist in the care of this patient.   Total time: 38 minutes  Greater than 50%  of this time was spent counseling and coordinating care related to the above assessment and plan.  Mariana Kaufman, AGNP-C Palliative Medicine   Please contact Palliative Medicine Team phone at [phone] for questions and concerns.

## 2020-07-11 NOTE — Progress Notes (Signed)
   July 19, 2020 1212  Clinical Encounter Type  Visited With Patient;Family  Visit Type Patient actively dying  Referral From Nurse  Consult/Referral To [place] visited with Family and Mr. Surrette. Pt is coming to end of life.  Emotional support given.  Mr. Caiazzo sister was on her way to the hospital and informed Mr. Malloy wife please have the nurse give the chaplain a call upon arrival.  Chaplain Skai Lickteig [place]

## 2020-07-11 NOTE — Progress Notes (Addendum)
Pt in bed moaning, doesn't follow command. No family at bedside. On morphine drip , 2mg  iv morphine bolus at shift change didn't seem to help him much. Pt was withdrawing while trying to take his vitals and got restless. All pt said was no when offered him drink. Otherwise just moaning. Will continue to monitor.

## 2020-07-11 NOTE — Progress Notes (Signed)
Family Medicine Teaching Service Daily Progress Note Intern Pager: [phone]  Patient name: Dennis Fischer Medical record number: [id number] Date of birth: Apr 26, 1946 Age: 74 y.o. Gender: male  Primary Care Provider: [name], MD Consultants: Palliative care  Code Status: DNR  Pt Overview and Major Events to Date:  Admitted 10/11  Assessment and Plan: Dennis Fischer is a 74 y.o. male presenting with dyspnea. PMH is significant for metastatic prostate cancer (mets to bone and spine). Patient is comfort care and awaiting hospice placement.   Acute Hypoxic Respiratory Failure Patient presented with new onset dyspnea with hypoxemia requiring 15 L NRB in the ED. CXR showed scattered pulmonary infiltrates within the right mid lung zone with moderate right parapneumonic effusion.  Could represent pneumonia, lung mets, or other etiologies; however, given that patient is a hospice patient and does not want further work-up, will not pursue further diagnostic testing. Patient satting 88% on 6L [place] with labored breathing on exam. Will treat with symptom management while awaiting residential hospice placement.  - supplemental oxygen- 6LNC  - comfort measures - morphine 10mg  sublingual q 2hrs prn  - lorazepam prn - Robinul prn - Tussionex prn - Seroquel 25mg   - scopalamine patch 1.5mg    Goals of care Pt is DNR. Palliative care consulted, appreciate involvement. Plan for symptom management and comfort care until bed available at residential hospice. Wife reports his son's area coming in from [place]. Family would like to go to [facility] if possible.  - palliative care following, appreciate recommendations   Hx of Prostate Cancer  Metastatic Bone Cancer Diagnosed with prostate cancer in 2016 which spread to the bone and spine a few years later. He developed neuropathy secondary to chemotherapy and was unable to complete all the cycles.  - No further treatment.   HTN  No further  treatment   FEN/GI: DYS 3 Prophylaxis: none  Disposition: med-tele, pending residential hospice placement (preferrably [facility])  Subjective:   Patient did not open his eyes and did not respond to verbal commands. Family was not bedside at the time of my exam.   Objective: Temp:  [97.6 F (36.4 C)] 97.6 F (36.4 C) (10/10 2042) Pulse Rate:  [25-100] 100 (10/10 2042) Resp:  [17-27] 25 (10/10 2042) BP: (93-129)/(49-76) 103/49 (10/10 2042) SpO2:  [29 %-100 %] 91 % (10/11 0041) Physical Exam: General: frail, ill appearing, non responsive to verbal stimuli  Cardiovascular: Tachycardic Respiratory: Course breath sounds in all lung fields bilaterally. Increased WOB Abdomen: soft, non distended  Extremities: cool, dry. No edema   Laboratory: No results for input(s): WBC, HGB, HCT, PLT in the last 168 hours. No results for input(s): NA, K, CL, CO2, BUN, CREATININE, CALCIUM, PROT, BILITOT, ALKPHOS, ALT, AST, GLUCOSE in the last 168 hours.  Invalid input(s): LABALBU   Imaging/Diagnostic Tests: No results found.  Shary Key, DO 2020-06-26, 7:09 AM PGY-1, [place] Intern pager: [phone], text pages welcome

## 2020-07-11 NOTE — Plan of Care (Signed)

## 2020-07-11 NOTE — Death Summary Note (Addendum)
[facility] Death Summary  Patient name: Dennis Fischer Medical record number: [id number] Date of birth: 12-Oct-1945 Age: 74 y.o. Gender: male Date of Admission: July 10, 2020  Date of Death: 07-12-2020 Admitting Physician: Lyndee Hensen, DO  Primary Care Provider: [name], MD Consultants: Palliative care   Indication for Hospitalization: Acute hypoxic respiratory failure   Discharge Diagnoses/Problem List:  Hypoxic respiratory failure Metastatic prostate cancer    Disposition: Death  Brief Hospital Course:   Dennis Fischer is a 74 y.o. male presenting with dyspnea. PMH is significant for metastatic prostate cancer (mets to bone and spine).  Acute Hypoxic Respiratory Failure On admission patient presented with new onset dyspnea with hypoxemia requiring 15 L NRB in the ED. CXR showed scattered pulmonary infiltrates within the right mid lung zone with moderate right parapneumonic effusion.  Patient's daughter was bedside, and it was agreed upon by family to provide comfort care only with the goal of transitioning to hospice. Further work-up and diagnostic testing was not pursued. Patient transitioned from NRB to 6L [place]. Patient was non verbal for the majority of the time hospitalized, and withdrew only to painful stimuli. Treated patient with symptomatic management only including morphine 10mg  sublingual q 2hrs prn, lorazepam prn, robinul prn, tussionex prn, Seroquel 25mg , and scopalamine patch 1.5mg  . Patient passed on 13-Jul-2023 with his wife [name] bedside and a chaplain present.   Significant Procedures: None  Significant Labs and Imaging:  CT Head Wo Contrast  Result Date: 05/23/2020 CLINICAL DATA:  Altered mental status EXAM: CT HEAD WITHOUT CONTRAST TECHNIQUE: Contiguous axial images were obtained from the base of the skull through the vertex without intravenous contrast. COMPARISON:  None. FINDINGS: Brain: Normal anatomic configuration. Mild parenchymal  volume loss is commensurate with the patient's age. Extensive subcortical and periventricular white matter changes are present likely reflecting the sequela of small vessel ischemia. Remote lacunar infarct noted within the left caudate nucleus. A small amount of a subtle subarachnoid hemorrhage is seen interdigitating within the sulci of the left frontal lobe. There is no associated abnormal mass effect or midline shift. No abnormal intra or extra-axial mass lesion. Ventricular size is normal. Cerebellum unremarkable. Vascular: Unremarkable Skull: Intact Sinuses/Orbits: There is near complete opacification of the ethmoid air cells bilaterally and frontal sinuses. Moderate mucosal thickening is seen within the visualized maxillary sinuses and right sphenoid sinus. The orbits are unremarkable. Other: Mastoid air cells and middle ear cavities are clear. Erosive changes are seen within the a right condylar process of the mandible, not fully characterized on this examination. IMPRESSION: 1. Small amount of subtle subarachnoid hemorrhage interdigitating within the sulci of the left frontal lobe. No associated abnormal mass effect or midline shift. Contrast enhanced MRI examination may be helpful to confirm this finding assess for alternate causes of subarachnoid hemorrhage in absence of a history of trauma. 2. Pansinusitis. 3. Erosive changes within the right condylar process of the mandible, not fully characterized on this examination. 4. These results were called by telephone at the time of interpretation on 05/23/2020 at 7:50 pm to provider [facility] , who verbally acknowledged these results. Electronically Signed   By: Fidela Salisbury MD   On: 05/23/2020 19:41   MR BRAIN WO CONTRAST  Result Date: 05/23/2020 CLINICAL DATA:  74 year old male with altered mental status. Noncontrast head CT suspicious for trace subarachnoid hemorrhage over the left anterior frontal convexity. Increased confusion, weakness.  Metastatic prostate cancer. Pancytopenia. EXAM: MRI HEAD WITHOUT CONTRAST TECHNIQUE: Multiplanar, multiecho pulse sequences of  the brain and surrounding structures were obtained without intravenous contrast. COMPARISON:  None. FINDINGS: The examination had to be discontinued prior to completion, and is significantly motion degraded due to patient confusion. Axial FLAIR and coronal DWI imaging is largely nondiagnostic. Axial T1 and coronal T2 weighted imaging could not be obtained. Brain: No definite restricted diffusion to suggest acute infarction. Axial T2* imaging demonstrates evidence of left superior frontal convexity subarachnoid hemorrhage as suspected by CT (series 15, images 38 and 41), as well as scattered microhemorrhage elsewhere in both cerebral hemispheres. No intraventricular blood is evident. No ventriculomegaly. No intracranial mass effect or midline shift. Patchy and widespread bilateral cerebral white matter T2 (and FLAIR) hyperintensity with areas most resembling chronic lacunar infarcts in the bilateral corona radiata and bilateral basal ganglia. Mild thalamic involvement on the right. No midline shift, mass effect, evidence of mass lesion, ventriculomegaly,. Cervicomedullary junction and pituitary are grossly normal. Vascular: Major intracranial vascular flow voids are preserved, with a degree of generalized intracranial artery tortuosity. Skull and upper cervical spine: Diffusely abnormal marrow signal in the visible upper cervical spine, and asymmetric decreased calvarium marrow signal on the right. Abnormal marrow signal throughout the visible mandible. Sinuses/Orbits: Bilateral paranasal sinus fluid and/or mucosal thickening, relatively sparing the hyperplastic left sphenoid sinus. No acute orbit finding. Other: Mastoids are well pneumatized. Grossly normal visible internal auditory structures. IMPRESSION: 1. Truncated and intermittently motion degraded exam. No definite acute infarct. 2.  Evidence of small volume of convexity subarachnoid hemorrhage as on the earlier CT. Although nonspecific, a similar pattern can be seen with minor trauma (such as fall) in elderly or coagulopathic patients. 3. Evidence of underlying chronic small vessel disease, including some parenchymal microhemorrhages. 4. Osseous metastatic disease, most apparent in the mandible and upper cervical spine. 5. Evidence of bilateral paranasal sinusitis. Electronically Signed   By: Genevie Ann M.D.   On: 05/23/2020 22:12   CT Abdomen Pelvis W Contrast  Result Date: 05/23/2020 CLINICAL DATA:  Metastatic prostate cancer, anemia, generalized weakness, acute nonlocalized abdominal pain EXAM: CT ABDOMEN AND PELVIS WITH CONTRAST TECHNIQUE: Multidetector CT imaging of the abdomen and pelvis was performed using the standard protocol following bolus administration of intravenous contrast. CONTRAST:  142mL OMNIPAQUE IOHEXOL 300 MG/ML  SOLN COMPARISON:  08/03/2019 FINDINGS: Lower chest: Moderate right and small left pleural effusions are partially visualized with associated bibasilar compressive atelectasis. Multiple pulmonary nodules are identified within the visualized lung bases, not well characterized on this examination. Moderate right coronary artery calcification. Global cardiac size within normal limits. Hepatobiliary: No focal liver abnormality is seen. No gallstones, gallbladder wall thickening, or biliary dilatation. Pancreas: Un remark Spleen: Unremarkable Adrenals/Urinary Tract: Adrenal glands are unremarkable. Kidneys are normal, without renal calculi, focal lesion, or hydronephrosis. Bladder is partially obscured by streak artifact from bilateral hip prostheses, but is otherwise unremarkable. Stomach/Bowel: Stomach, small bowel, and large bowel are unremarkable. Appendix normal. No free intraperitoneal gas or fluid. Vascular/Lymphatic: The abdominal vasculature is age-appropriate with mild aortoiliac atherosclerotic  calcification. No aneurysm. No pathologic adenopathy within the abdomen and pelvis. Reproductive: The prostate gland is largely obscured by streak artifact. Brachytherapy seeds are noted, however. Other: Rectum unremarkable Musculoskeletal: Bilateral total hip arthroplasty has been performed. Since the prior examination, there have developed a mixed lytic and sclerotic pattern within the T12-L3 vertebral bodies which is indeterminate. Possible erosive changes involving the right transverse process of L2, best seen on axial image # 32/3. Additionally, fractures of the superior endplate of G95 and L1 and L2  have developed, likely subacute to chronic in nature though new since prior examination. Stable grade 1 anterolisthesis of L4 upon L5. IMPRESSION: 1. Moderate right and small left pleural effusions are partially visualized with associated bibasilar compressive atelectasis. 2. Multiple pulmonary nodules within the visualized lung bases, not well characterized on this examination. Findings are concerning for metastatic disease. 3. Interval development of a mixed lytic and sclerotic pattern within the T12-L3 vertebral bodies which is indeterminate. Additionally, fractures of the superior endplate of N16 and L1 and L2 have developed, likely subacute to chronic in nature though new since prior examination. This can be seen in the setting of marrow infiltrative processes, particularly given the possible erosive change involving the L2 vertebral body. Contrast enhanced MRI examination may be helpful to further evaluate this. Aortic Atherosclerosis (ICD10-I70.0). Electronically Signed   By: Fidela Salisbury MD   On: 05/23/2020 19:31   DG Chest Portable 1 View  Result Date: 06/19/2020 CLINICAL DATA:  Dyspnea EXAM: PORTABLE CHEST 1 VIEW COMPARISON:  05/17/2018 FINDINGS: Small to moderate right pleural effusion has developed with mild right basilar compressive atelectasis. Scattered peripheral pulmonary infiltrates are  seen within the right upper and mid lung zone, likely infectious or inflammatory in the acute setting. Left lung is clear. No pneumothorax. No pleural effusion on the left. Right internal jugular chest port is seen with its tip within the superior vena cava. Cardiac size is within normal limits. Pulmonary vascularity is normal. Mixed lytic and sclerotic metastasis is seen involving the right coracoid process and right scapula. Sclerosis of multiple midthoracic vertebral bodies is in keeping with osseous metastatic disease related to the patient's underlying metastatic prostate cancer. IMPRESSION: Scattered pulmonary infiltrates within the right mid lung zone, most in keeping with atypical infection in the appropriate clinical setting, with associated moderate right parapneumonic effusion. Electronically Signed   By: Fidela Salisbury MD   On: 06/19/2020 00:01    Shary Key, DO 06/21/2020, 9:03 AM PGY-1, [place] [facility]  Resident Addendum I have separately seen and examined the patient.  I have discussed the findings and exam with the student and agree with the above note.  I helped develop the management plan that is described in the student's note and I agree with the content.    Addison Naegeli, MD PGY-3 [facility] residency program

## 2020-07-11 NOTE — Progress Notes (Signed)
   Jul 17, 2020 1446  Clinical Encounter Type  Visited With Patient and family together  Visit Type Death  Referral From Nurse  Consult/Referral To Chaplain  Spiritual Encounters  Spiritual Needs Emotional;Grief support;Prayer  Dennis Fischer came to end of life.  Chaplain gave emotional and grief support to the family and prayer.    Chaplain Juancarlos Crescenzo Morgan-Simpson

## 2020-07-11 NOTE — Progress Notes (Signed)
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  Dennis Fischer was transported to the ER after becoming increasing dyspneic and found to be hypoxic by EMS with saturations in the 80s. Goal was to move to [facility] for end of life care, however there are no beds that are available. Pt is being admitted to teaching services for dyspnea and symptom management while waiting for a bed at [facility]. This is a related hospital admission.   Visited pt at the bedside, no family present, staff had just finished bathing him.  Pt is unresponsive, appears comfortable and peaceful.  Called Dennis Fischer to update her on his condition, she advised she was leaving to head towards the hospital.  V/S: 97.7 ax, 73/42, HR 106, RR 22 mouth breathing, SPO2 88% on HFNC @ 6 lpm Labs & diagnostics: none, full comfort measures IVs/PRNs: Morphine infusion continuous @ 2 mg/hr with bolus 2 mg IV q 15  Minutes PRN--he has needed three boluses  Problem List: - prostate cancer - pt is actively dying, on morphine gtt for comfort, appears peaceful  GOC: clear, full comfort measures D/C plan: on list for [facility]-currently no bed, pt appears to be actively dying and likely will have a hospital death IDT: hospice team updated Family: updated wife  Dennis Carbon RN, BSN, [place] [facility] Liaison

## 2020-07-11 NOTE — Progress Notes (Signed)
FPTS Interim Progress Note  S:Per chart review, noted that patient on room air when requiring oxygen supplementation. Spoke with nurse who stated that she tried to instruct patient to place mask back on but he just kept saying no. Nurse shares that she gave a dose of Ativan because patient was moaning but did not show any signs of agitation or aggressive behavior. Expressed the importance of oxygen supplementation for patient. When I walked into the room, patient was on 5L [place]. According to nurse, patient has been consistent since shift started at 7pm, and heard from day nurse that patient actually started to look better this evening compared to earlier in the day.   O: BP (!) 103/49 (BP Location: Right Arm)   Pulse 100   Temp 97.6 F (36.4 C) (Axillary)   Resp (!) 25   Ht 5\' 11"  (1.803 m)   Wt 68 kg   SpO2 (!) 85%   BMI 20.91 kg/m   General: Patient sleeping comfortably, in no acute distress. CV: RRR, no murmurs or gallops appreciated  Resp: lungs clear to auscultation bilaterally, no evidence of respiratory distress on 5L [place] Ext: radial pulses intact bilaterally  Psych: no agitation noted   A/P: -continue with current plan -Ativan prn for aggression/agitation   Donney Dice, DO 26-Jun-2020, 12:26 AM PGY-1, [place] [facility] Service pager [phone]

## 2020-07-11 DEATH — deceased

## 2023-12-04 NOTE — Telephone Encounter (Signed)
error
